# Patient Record
Sex: Female | Born: 1960 | Race: White | Hispanic: No | Marital: Single | Smoking: Former smoker
Health system: Southern US, Community
[De-identification: ages and names within clinical notes are randomized; demographics above are authoritative.]

## PROBLEM LIST (undated history)

## (undated) DIAGNOSIS — C801 Malignant (primary) neoplasm, unspecified: Secondary | ICD-10-CM

## (undated) DIAGNOSIS — R600 Localized edema: Secondary | ICD-10-CM

## (undated) DIAGNOSIS — R0600 Dyspnea, unspecified: Secondary | ICD-10-CM

## (undated) DIAGNOSIS — F419 Anxiety disorder, unspecified: Secondary | ICD-10-CM

## (undated) DIAGNOSIS — I509 Heart failure, unspecified: Secondary | ICD-10-CM

## (undated) DIAGNOSIS — M1990 Unspecified osteoarthritis, unspecified site: Secondary | ICD-10-CM

## (undated) DIAGNOSIS — F329 Major depressive disorder, single episode, unspecified: Secondary | ICD-10-CM

## (undated) DIAGNOSIS — F32A Depression, unspecified: Secondary | ICD-10-CM

## (undated) DIAGNOSIS — E785 Hyperlipidemia, unspecified: Secondary | ICD-10-CM

## (undated) DIAGNOSIS — E119 Type 2 diabetes mellitus without complications: Secondary | ICD-10-CM

## (undated) DIAGNOSIS — I2510 Atherosclerotic heart disease of native coronary artery without angina pectoris: Secondary | ICD-10-CM

## (undated) HISTORY — PX: BLADDER REPAIR: SHX76

## (undated) HISTORY — DX: Heart failure, unspecified: I50.9

## (undated) HISTORY — DX: Atherosclerotic heart disease of native coronary artery without angina pectoris: I25.10

## (undated) HISTORY — PX: CARPAL TUNNEL RELEASE: SHX101

## (undated) HISTORY — PX: BACK SURGERY: SHX140

## (undated) HISTORY — PX: ABDOMINAL HYSTERECTOMY: SHX81

---

## 1999-05-21 ENCOUNTER — Encounter: Admission: RE | Admit: 1999-05-21 | Discharge: 1999-08-19 | Payer: Self-pay

## 2000-12-14 ENCOUNTER — Other Ambulatory Visit: Admission: RE | Admit: 2000-12-14 | Discharge: 2000-12-14 | Payer: Self-pay | Admitting: Internal Medicine

## 2001-01-01 ENCOUNTER — Encounter: Payer: Self-pay | Admitting: Internal Medicine

## 2001-01-01 ENCOUNTER — Ambulatory Visit (HOSPITAL_COMMUNITY): Admission: RE | Admit: 2001-01-01 | Discharge: 2001-01-01 | Payer: Self-pay | Admitting: Internal Medicine

## 2001-03-11 ENCOUNTER — Emergency Department (HOSPITAL_COMMUNITY): Admission: EM | Admit: 2001-03-11 | Discharge: 2001-03-11 | Payer: Self-pay | Admitting: Emergency Medicine

## 2001-04-13 ENCOUNTER — Encounter: Admission: RE | Admit: 2001-04-13 | Discharge: 2001-07-12 | Payer: Self-pay

## 2001-06-11 ENCOUNTER — Encounter: Admission: RE | Admit: 2001-06-11 | Discharge: 2001-09-09 | Payer: Self-pay | Admitting: Neurosurgery

## 2001-10-08 ENCOUNTER — Emergency Department (HOSPITAL_COMMUNITY): Admission: EM | Admit: 2001-10-08 | Discharge: 2001-10-08 | Payer: Self-pay | Admitting: *Deleted

## 2001-10-17 ENCOUNTER — Other Ambulatory Visit: Admission: RE | Admit: 2001-10-17 | Discharge: 2001-10-17 | Payer: Self-pay | Admitting: Obstetrics & Gynecology

## 2001-11-29 ENCOUNTER — Ambulatory Visit (HOSPITAL_COMMUNITY): Admission: RE | Admit: 2001-11-29 | Discharge: 2001-11-29 | Payer: Self-pay | Admitting: Obstetrics & Gynecology

## 2001-12-05 ENCOUNTER — Ambulatory Visit (HOSPITAL_COMMUNITY): Admission: RE | Admit: 2001-12-05 | Discharge: 2001-12-05 | Payer: Self-pay | Admitting: Pulmonary Disease

## 2002-06-07 ENCOUNTER — Ambulatory Visit (HOSPITAL_COMMUNITY): Admission: RE | Admit: 2002-06-07 | Discharge: 2002-06-07 | Payer: Self-pay | Admitting: Pulmonary Disease

## 2002-07-30 ENCOUNTER — Ambulatory Visit (HOSPITAL_COMMUNITY): Admission: RE | Admit: 2002-07-30 | Discharge: 2002-07-30 | Payer: Self-pay | Admitting: Pulmonary Disease

## 2002-10-03 ENCOUNTER — Inpatient Hospital Stay (HOSPITAL_COMMUNITY): Admission: RE | Admit: 2002-10-03 | Discharge: 2002-10-06 | Payer: Self-pay | Admitting: Obstetrics & Gynecology

## 2002-11-12 ENCOUNTER — Emergency Department (HOSPITAL_COMMUNITY): Admission: EM | Admit: 2002-11-12 | Discharge: 2002-11-12 | Payer: Self-pay | Admitting: Emergency Medicine

## 2003-03-11 ENCOUNTER — Ambulatory Visit (HOSPITAL_COMMUNITY): Admission: RE | Admit: 2003-03-11 | Discharge: 2003-03-11 | Payer: Self-pay | Admitting: Pulmonary Disease

## 2003-03-14 ENCOUNTER — Ambulatory Visit (HOSPITAL_COMMUNITY): Admission: RE | Admit: 2003-03-14 | Discharge: 2003-03-14 | Payer: Self-pay | Admitting: *Deleted

## 2003-03-16 ENCOUNTER — Emergency Department (HOSPITAL_COMMUNITY): Admission: EM | Admit: 2003-03-16 | Discharge: 2003-03-16 | Payer: Self-pay | Admitting: Emergency Medicine

## 2003-03-31 ENCOUNTER — Ambulatory Visit (HOSPITAL_COMMUNITY): Admission: RE | Admit: 2003-03-31 | Discharge: 2003-03-31 | Payer: Self-pay | Admitting: Cardiology

## 2003-06-09 ENCOUNTER — Emergency Department (HOSPITAL_COMMUNITY): Admission: EM | Admit: 2003-06-09 | Discharge: 2003-06-09 | Payer: Self-pay | Admitting: Emergency Medicine

## 2003-06-11 ENCOUNTER — Ambulatory Visit (HOSPITAL_COMMUNITY): Admission: RE | Admit: 2003-06-11 | Discharge: 2003-06-11 | Payer: Self-pay | Admitting: Pulmonary Disease

## 2003-08-04 ENCOUNTER — Ambulatory Visit (HOSPITAL_COMMUNITY): Admission: RE | Admit: 2003-08-04 | Discharge: 2003-08-04 | Payer: Self-pay | Admitting: Pulmonary Disease

## 2004-03-22 ENCOUNTER — Ambulatory Visit (HOSPITAL_COMMUNITY): Admission: RE | Admit: 2004-03-22 | Discharge: 2004-03-22 | Payer: Self-pay | Admitting: Pulmonary Disease

## 2004-04-21 ENCOUNTER — Ambulatory Visit (HOSPITAL_COMMUNITY): Admission: RE | Admit: 2004-04-21 | Discharge: 2004-04-21 | Payer: Self-pay | Admitting: Family Medicine

## 2004-08-05 ENCOUNTER — Ambulatory Visit (HOSPITAL_COMMUNITY): Admission: RE | Admit: 2004-08-05 | Discharge: 2004-08-05 | Payer: Self-pay | Admitting: Family Medicine

## 2004-08-23 ENCOUNTER — Encounter: Admission: RE | Admit: 2004-08-23 | Discharge: 2004-08-23 | Payer: Self-pay | Admitting: Orthopedic Surgery

## 2005-08-08 ENCOUNTER — Ambulatory Visit (HOSPITAL_COMMUNITY): Admission: RE | Admit: 2005-08-08 | Discharge: 2005-08-08 | Payer: Self-pay | Admitting: Obstetrics & Gynecology

## 2005-12-14 ENCOUNTER — Ambulatory Visit (HOSPITAL_COMMUNITY): Admission: RE | Admit: 2005-12-14 | Discharge: 2005-12-14 | Payer: Self-pay | Admitting: Neurological Surgery

## 2006-01-23 ENCOUNTER — Inpatient Hospital Stay (HOSPITAL_COMMUNITY): Admission: RE | Admit: 2006-01-23 | Discharge: 2006-01-25 | Payer: Self-pay | Admitting: Neurological Surgery

## 2006-05-01 ENCOUNTER — Ambulatory Visit: Payer: Self-pay | Admitting: Cardiology

## 2006-08-17 ENCOUNTER — Ambulatory Visit (HOSPITAL_COMMUNITY): Admission: RE | Admit: 2006-08-17 | Discharge: 2006-08-17 | Payer: Self-pay | Admitting: Family Medicine

## 2007-04-17 ENCOUNTER — Encounter: Payer: Self-pay | Admitting: Orthopedic Surgery

## 2007-04-17 ENCOUNTER — Ambulatory Visit (HOSPITAL_COMMUNITY): Admission: RE | Admit: 2007-04-17 | Discharge: 2007-04-17 | Payer: Self-pay | Admitting: Neurological Surgery

## 2007-07-18 ENCOUNTER — Ambulatory Visit (HOSPITAL_COMMUNITY): Admission: RE | Admit: 2007-07-18 | Discharge: 2007-07-18 | Payer: Self-pay | Admitting: Family Medicine

## 2008-07-11 HISTORY — PX: BACK SURGERY: SHX140

## 2008-07-28 ENCOUNTER — Ambulatory Visit (HOSPITAL_COMMUNITY): Admission: RE | Admit: 2008-07-28 | Discharge: 2008-07-28 | Payer: Self-pay | Admitting: Obstetrics & Gynecology

## 2009-01-13 ENCOUNTER — Encounter: Payer: Self-pay | Admitting: Orthopedic Surgery

## 2009-01-13 ENCOUNTER — Ambulatory Visit (HOSPITAL_COMMUNITY): Admission: RE | Admit: 2009-01-13 | Discharge: 2009-01-13 | Payer: Self-pay | Admitting: Family Medicine

## 2009-02-18 ENCOUNTER — Ambulatory Visit: Payer: Self-pay | Admitting: Orthopedic Surgery

## 2009-02-18 DIAGNOSIS — M1710 Unilateral primary osteoarthritis, unspecified knee: Secondary | ICD-10-CM | POA: Insufficient documentation

## 2009-02-18 DIAGNOSIS — M23302 Other meniscus derangements, unspecified lateral meniscus, unspecified knee: Secondary | ICD-10-CM

## 2009-02-18 DIAGNOSIS — IMO0002 Reserved for concepts with insufficient information to code with codable children: Secondary | ICD-10-CM | POA: Insufficient documentation

## 2009-02-26 ENCOUNTER — Telehealth: Payer: Self-pay | Admitting: Orthopedic Surgery

## 2009-03-03 ENCOUNTER — Ambulatory Visit (HOSPITAL_COMMUNITY): Admission: RE | Admit: 2009-03-03 | Discharge: 2009-03-03 | Payer: Self-pay | Admitting: Orthopedic Surgery

## 2009-03-09 ENCOUNTER — Ambulatory Visit: Payer: Self-pay | Admitting: Orthopedic Surgery

## 2009-07-30 ENCOUNTER — Ambulatory Visit (HOSPITAL_COMMUNITY): Admission: RE | Admit: 2009-07-30 | Discharge: 2009-07-30 | Payer: Self-pay | Admitting: Obstetrics & Gynecology

## 2010-06-22 ENCOUNTER — Ambulatory Visit (HOSPITAL_COMMUNITY)
Admission: RE | Admit: 2010-06-22 | Discharge: 2010-06-22 | Payer: Self-pay | Source: Home / Self Care | Attending: Obstetrics & Gynecology | Admitting: Obstetrics & Gynecology

## 2010-07-29 ENCOUNTER — Emergency Department (HOSPITAL_COMMUNITY)
Admission: EM | Admit: 2010-07-29 | Discharge: 2010-07-30 | Payer: Self-pay | Source: Home / Self Care | Admitting: Emergency Medicine

## 2010-08-02 ENCOUNTER — Ambulatory Visit (HOSPITAL_COMMUNITY)
Admission: RE | Admit: 2010-08-02 | Discharge: 2010-08-02 | Payer: Self-pay | Source: Home / Self Care | Attending: Obstetrics & Gynecology | Admitting: Obstetrics & Gynecology

## 2010-08-02 LAB — URINALYSIS, ROUTINE W REFLEX MICROSCOPIC
Nitrite: NEGATIVE
Specific Gravity, Urine: >1.03 — ABNORMAL HIGH (ref 1.005–1.030)
pH: 5.5 (ref 5.0–8.0)

## 2010-08-02 LAB — CBC
HCT: 40.3 % (ref 36.0–46.0)
Hemoglobin: 14.3 g/dL (ref 12.0–15.0)
RDW: 13.4 % (ref 11.5–15.5)
WBC: 11.1 10*3/uL — ABNORMAL HIGH (ref 4.0–10.5)

## 2010-08-02 LAB — BASIC METABOLIC PANEL
GFR calc Af Amer: >60 mL/min (ref >60)
GFR calc non Af Amer: >60 mL/min (ref >60)
Glucose, Bld: 104 mg/dL — ABNORMAL HIGH (ref 70–99)
Potassium: 3.9 mEq/L (ref 3.5–5.1)
Sodium: 138 mEq/L (ref 135–145)

## 2010-08-02 LAB — DIFFERENTIAL
Basophils Absolute: 0.1 10*3/uL (ref 0.0–0.1)
Basophils Relative: 0 % (ref 0–1)
Lymphocytes Relative: 31 % (ref 12–46)
Neutro Abs: 6.8 10*3/uL (ref 1.7–7.7)

## 2010-11-26 NOTE — Discharge Summary (Signed)
Baxter, Susan             ACCOUNT NO.:  [id number]   MEDICAL RECORD NO.:  [id number]          PATIENT TYPE:  INP   LOCATION:  3003                         FACILITY:  [facility]   PHYSICIAN:  Stefani Dama, M.D.  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   DISCHARGE AND FINAL DIAGNOSIS:  Lumbar spondylosis with herniated nucleus  pulposus L5-S1 with lumbar radiculopathy.   CONDITION ON DISCHARGE:  Improving.   HOSPITAL COURSE:  [name] a 50 year old individual was had  significant chronic back pain with lumbar radicular pain getting worse in  the past number of months.  She was evaluated as an outpatient and advised  her regarding decompression, arthrodesis from L5-S1 with PEEK spacers,  pedicle screw fixation and she was taken to the operating room on July 16  where this procedure was performed.  She tolerated it well.  She required  PCA medication for the first 24 hours.  She was evaluated by physical  therapy and found to require the use of walker and it was noted that she  would require some help with the use of a shower chair.  These have been  ordered during the e postoperative course and she has gradually and switched  oral Percocet which she did not tolerate.  The patient requested Vicodin.  The patient has a longstanding history of high dose Vicodin usage and she  will be weaned over the next 3 months.  The incision on her back is clean  and dry.  She is ambulatory with the use of walker at the current time.  Her  condition is improving steadily.   The patient is discharged at this time with appropriate medications  including Vicodin EES #60 without refills, Flexeril 10 mg #30 with p.r.n.  refills.  The patient has been advised to plan to gradually  reduce and wean  her away from narcotic medications over the next 2-3 months' time.  The  patient's husband was in agreement with this plan.      Stefani Dama, M.D.  Electronically Signed     HJE/MEDQ  D:  01/25/2006  T:  01/25/2006  Job:  [id number]

## 2010-11-26 NOTE — Consult Note (Signed)
[facility]  Patient:    [name], [name] Visit Number: [id number] MRN: [id number]          Service Type: PMG Location: [facility] Attending Physician:  Sondra Come Dictated by:   Sondra Come, D.O. Proc. Date: 04/30/01 Admit Date:  04/13/2001                            Consultation Report  REASON FOR EVALUATION:  Ms. Viveiros returns to clinic today as scheduled for trial of lumbar epidural steroid injections for degenerative disk disease of the lumbar spine with L5-S1 distribution radicular pain on the right.  Patient states that her pain today is a 10/10 on a subjective scale.  She was prescribed Elavil 25 mg for sleep but she is unable to tolerate this.  She states that she was unable to sleep and then had hangover-type symptoms.  She also has gone to physical therapy but states that it hurt her worse than she was prior.  She stated she went to the emergency room a week or so ago secondary to pain and was given Valium 10 mg but is not currently taking these.  Patient denies any new neurologic complaints.  No bowel and bladder dysfunction.  PHYSICAL EXAMINATION:  GENERAL:  Physical examination reveals a healthy female in no acute distress.  VITAL SIGNS:  Blood pressure 123/64, pulse 74, respirations 18 and regular, O2 saturation is 98%.  MUSCULOSKELETAL/NEUROMUSCULAR:  Manual muscle testing is 5/5, bilateral lower extremities.  Sensory is intact to light touch at this time, bilateral lower extremities.  Muscle stretch reflexes are 2+/4, bilateral patellar, medial hamstrings and Achilles.  Straight leg raise is negative bilaterally.  IMPRESSION: 1. 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  Pain is    likely discogenic and there are right L5-S1 radicular symptoms. 2. Nonrestorative sleep disorder.  PLAN: 1. Lumbar epidural steroid injection.  Procedure was described to patient and    informed consent was obtained.   Patient was brought back to the fluoroscopy    suite and placed on the table in prone position.  Skin was prepped and    draped in the usual sterile fashion.  Skin and subcutaneous tissue were    anesthetized with 3 cc of 1% lidocaine preservative-free.  Under    fluoroscopy, it is noted that patient has a partially lumbarized S1    vertebra.  Under direct fluoroscopic guidance, an 18-gauge 3-1/2-inch Tuohy    needle was advanced into the paramedian right L5-S1 epidural space with    loss-of-resistance technique.  No CSF, heme or paresthesia were noted.    This was then injected with 1 cc of Depo-Medrol 80 mg/cc,    preservative-free, plus 2 cc of normal saline without complication.    Patient tolerated the procedure well.  Discharge instructions were given. 2. Discontinue Elavil. 3. Trial of trazodone 50 mg 1 p.o. q.h.s., #30 with 1 refill. 4. Continue current medications to include hydrocodone 7.5 mg one p.o. q.6h.    as needed for severe pain as well as Neurontin 300 mg one p.o. t.i.d. 5. Patient to return to clinic in one week for reevaluation and possible    second lumbar epidural steroid injection.  Patient was educated in the above findings and recommendations and understands.  There were no barriers to communication. Dictated by:   Sondra Come, D.O. Attending Physician:  Sondra Come DD:  04/30/01 TD:  05/01/01 Job: [id number]

## 2010-11-26 NOTE — Op Note (Signed)
[place]  Patient:    [name], [name] Visit Number: [id number] MRN: [id number]          Service Type: DSU Location: [facility] [id number] Attending Physician:  Lazaro Arms Proc. Date: 11/29/01 Admit Date:  11/29/2001 Discharge Date: 11/29/2001                             Operative Report  PREOPERATIVE DIAGNOSES: 1. Multiple vulvar and perianal lesions, seborrheic keratoses, and perianal    condylomata. 2. Seborrheic keratosis in her lower incision line x3.  POSTOPERATIVE DIAGNOSES: 1. Multiple vulvar and perianal lesions, seborrheic keratoses, and perianal    condylomata. 2. Seborrheic keratosis in her lower incision line x3.  PROCEDURE:  Excision and argon beam coagulator partial vulvectomy.  SURGEON:  _____.  ANESTHESIA: 1. General with laryngeal mask airway. 2. Pudendal block using Sensorcaine 1.5% with 1:200,000 epinephrine placed by    Dr. _____, 20 cc total.  FINDINGS:  The patient was known to have biopsy-proven seborrheic keratosis and condylomata from the vulva and perianal region.  There was no dysplasia or vulvar dysplasia or cancer involved.  DESCRIPTION OF PROCEDURE:  The patient was taken to the operating room and placed in supine position, underwent general LMA, placed in the dorsal lithotomy position, prepped and draped in the usual fashion.  An argon beam coagulator was used and placed on a power setting of 70, and the lesions were excised down to the dermis.  There was probably a total of about 30-40 lesions total, would qualify as a simple partial vulvectomy.  There was no blood loss from the procedure at all.  The patient tolerated the procedure well.  She was awakened from anesthesia and taken to the recovery room in good and stable condition.  All counts were correct. Attending Physician:  Lazaro Arms DD:  11/29/01 TD:  12/01/01 Job: [id number]

## 2010-11-26 NOTE — Consult Note (Signed)
[name], [name]                       ACCOUNT NO.:  [id number]   MEDICAL RECORD NO.:  [id number]                   PATIENT TYPE:  EMS   LOCATION:  ED                                   FACILITY:  [facility]   PHYSICIAN:  Tilda Burrow, M.D.              DATE OF BIRTH:  [birth year]   DATE OF CONSULTATION:  DATE OF DISCHARGE:  03/16/2003                                   CONSULTATION   EMERGENCY ROOM CONSULTATION   TIME OF VISIT:  3:45 p.m.   CHIEF COMPLAINT:  Severe vulvar pain 2 days after procedure at [facility]  [facility] office.  This 50 year old female was seen in the ER due to  progressive severe pain in the perineal area after procedure performed at  our office.  The patient is vague over exactly what she had done, but she  knows that she had packing placed.  She denies use of antibiotics.   She is on hydrocodone without adequate pain relief.  She denies fever or  chills.  No nausea or vomiting.  She is out of work this weekend and is  severely uncomfortable and knows that work would be impossible at this  current level.  She rates the pain as quite high.   PAST MEDICAL HISTORY:  Benign.   ALLERGIES:  Denies medical allergies.   PHYSICAL EXAMINATION:  GENERAL: Exam shows a generally healthy-appearing,  Caucasian female in moderate discomfort.  ABDOMEN:  Nontender without masses.  EXTREMITIES:  Normal, no limited range of motion.  GENITOURINARY: External genitalia shows an open 3 cm lesion just lateral to  the left gluteal crease with iodoform packing. There is significant malodor.  Packing is removed to reveal a 2 cm deep x 3 cm long crater in the subcu  tissues.  There is no active purulent drainage.  The patient does not allow  thorough exam, but there is only a small area of erythema around it and no  extension towards the anus or vagina.  No extension towards the anus or  vagina.  No vaginal discharge visible at the introitus.  Pelvic exam,  itself,  deferred.   IMPRESSION:  Cellulitis and swelling around previously opened vulvar  abscess.   PLAN:  1. Add antibiotic therapy and Sitz baths to current regimen.  2. Increase analgesics to Tylox 1-2 q.4h. p.r.n. severe pain.  3.     Sitz baths at least twice daily and Keflex 500 q.i.d. x5 days.  4. Follow up in 2 days unless symptoms dramatically improve, at which time     visit can be delayed 1 week.   ADDENDUM:  OV intermediate ER visit.  CPT [phone]  Tilda Burrow, M.D.    JVF/MEDQ  D:  03/16/2003  T:  03/16/2003  Job:  [id number]

## 2010-11-26 NOTE — Consult Note (Signed)
[facility]  Patient:    [name], [name] Visit Number: [id number] MRN: [id number]          Service Type: PMG Location: [facility] Attending Physician:  Sondra Come Dictated by:   Sondra Come, D.O. Proc. Date: 05/07/01 Admit Date:  04/13/2001                            Consultation Report  REASON FOR CONSULTATION:  Ms. Amesquita returns to clinic today for reevaluation as scheduled, also for consideration of second lumbar epidural steroid injection.  Patient has degenerative disk disease of the lumbar spondylosis without myelopathy with an annular tear at L5-S1 with right L5-S1 radicular symptoms.  She had an epidural steroid injection last week but states it did not help her; if anything, it made her worse.  She also continues with physical therapy which she also states has made her worse.  Now she complains of pain radiating into her left lower extremity as well and pain in her upper extremities and paresthesias in all four extremities with fatigue and poor endurance.  She has tried taking the trazodone at bedtime, which she states is not helping her sleep.  She continues to take Neurontin 300 mg t.i.d. without significant improvement.  She takes Norco 7.5/325 mg sparingly. She brings her medications with her.  She has 15 of 50 pills of Norco left; this was written on April 16, 2001.  Currently, patients pain is a 10/10 on a subjective scale.  Her function and quality-of-life indices remain declined. Her sleep remains poor.  Her paresthetic symptoms in her upper and lower extremities are intermittent.  She complains of swelling in all four extremities as well.  PHYSICAL EXAMINATION:  GENERAL:  Physical examination reveals a healthy female in no acute distress. Patients mood and affect are depressed and flat.  VITAL SIGNS:  Blood pressure is 113/62, pulse 75, respirations 16, O2 saturation 98%.  NEUROMUSCULAR:  Gait is antalgic.  Range of motion  of the cervical and lumbar spines is limited secondary to pain.  Manual muscle testing is 5/5, bilateral upper and lower extremities, with diffuse giveaway weakness and poor effort. Sensory examination is decreased to light touch in a stocking-glove distribution at this time.  Muscle stretch reflexes are 2+/4, bilateral biceps, triceps, brachioradialis, pronator teres, patellar, medial hamstrings and Achilles.  Tinels is negative, bilateral wrists, over the median and ulnar nerves.  Palpatory examination reveals diffuse tenderness to palpation, bilateral upper back, periscapular, lateral epicondyles, anterior cervical, anterior chest, gluteal muscles, trochanteric bursae and medial knees.  IMPRESSION: 1. 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  There is    discogenic component with annular tear at L5-S1 with predominantly    right-sided radicular symptoms, however, this lesion is unlikely causing    patients current symptoms to involve the left lower extremity and    bilateral upper extremities. 2. Fibromyalgic-like symptoms. 3. Nonrestorative sleep disorder. 4. Depressive mood.  PLAN: 1. Will forego lumbar epidural steroid injection at this time per patients    request. 2. Will obtain blood work to include complete chemistry panel with renal and    liver function, CBC, ANA, rheumatoid factor, sed rate and TSH. 3. Patient to continue current medications.  I have instructed her to increase    the trazodone to 50 mg, two pills at bedtime, continue Neurontin and    continue Norco sparingly at this time. 4. Patient to continue  physical therapy. 5. Patient to return to clinic in one to two weeks to discuss laboratory work    and further treatment options.  Patient was educated in the above findings and recommendations and understands.  There were no barriers to communication. Dictated by:   Sondra Come, D.O. Attending Physician:  Sondra Come DD:   05/07/01 TD:  05/08/01 Job: [id number]

## 2010-11-26 NOTE — Consult Note (Signed)
[facility]  Patient:    [name], [name] Visit Number: [id number] MRN: [id number]          Service Type: PMG Location: [facility] Attending Physician:  Sondra Come Dictated by:   Sondra Come, D.O. Proc. Date: 05/21/01 Admit Date:  04/13/2001                            Consultation Report  REASON FOR FOLLOWUP:  [name] returns to clinic today for reevaluation as scheduled.  She was last seen on May 07, 2001.  Since that time, she has completed her course of physical therapy and continues doing a home exercise program, which she states she feels is making matters worse, although she continued to do the exercises in hopes that it will eventually help her.  I commend her for her continued participation in a home exercise program and we discuss this.  Her sleep is improved with trazodone 100 mg at bedtime.  Her pain today still is a 10/10 on a subjective scale.  Since last visit, I reviewed her laboratory workup which included a mildly elevated white blood cell count at 10.6 (normal is 10.5).  Sed rate was 31.  Otherwise, comprehensive metabolic panel, thyroid-stimulating hormone, rheumatoid factor and ANA are normal.  [name] pain is primarily in her back radiating down her right lower extremity and occasionally to the left lower extremity. Her upper extremity and upper back pain have improved since last visit.  She continues to take Neurontin 300 mg three times daily and Norco 7.5 mg up to three times daily.  She states she typically will take Aleve or Goodys first to see if that relieves her pain.  She denies any significant depression.  She denies bowel and bladder dysfunction or other neurologic complaints.  I review health and history form and 14-point review of systems.  Patient quit smoking approximately five days ago and I commend her for this.  PHYSICAL EXAMINATION:  GENERAL:  Physical examination reveals a healthy female in no  acute distress. Mood is depressed and affect is flat.  VITAL SIGNS:  Blood pressure is 105/63, pulse 80, respirations 16, O2 saturation 97%.  NEUROMUSCULAR:  Range of motion of the lumbar spine is limited secondary to pain.  Manual muscle testing is 5/5, bilateral lower extremities, with occasional poor effort.  Sensory exam is decreased to light touch mildly in a stocking distribution.  Muscle stretch reflexes are 2+/4, bilateral lower extremities.  Neuro exam of the upper extremities reveals normal strength, sensation and reflexes today.  There is less tenderness to palpation in the upper back musculature.  Mild tenderness to palpation in bilateral lumbar paraspinals.  IMPRESSION: 1. Degenerative disk disease of lumbar spine without myelopathy, discogenic    component with annular tear at L5-S1 with predominantly right-sided    radicular symptoms. 2. Fibromyalgic-like symptoms seem to be improved today. 3. Nonrestorative sleep disorder, improved with trazodone 100 mg q.h.s. 4. Depressed mood.  PLAN: 1. Discussed exercise program with patient and encouraged her to continue to    perform home exercise program. 2. Will refer patient to Behavioral Health psychology to see    Dr. Gladstone Pih at the [facility] for    coping strategies, stress management, relaxation techniques, etc. 3. Continue trazodone 100 mg p.o. q.h.s., #30 with two refills. 4. Continue Norco 7.5 mg one p.o. t.i.d. as needed, #90 with no refills.  Will  try to prevent escalation of narcotic medication.  May need to consider a    longer-acting medication such as Duragesic patch. 5. Continue Neurontin. 6. Patient to return to clinic in one month for reevaluation.  Patient was educated in the above findings and recommendations and understands.  There were no barriers to communication. Dictated by:   Sondra Come, D.O. Attending Physician:  Sondra Come DD:  05/21/01 TD:   05/21/01 Job: [id number]

## 2010-11-26 NOTE — Consult Note (Signed)
[facility]  Patient:    [name], [name] Visit Number: [id number] MRN: [id number]          Service Type: PMG Location: [facility] Attending Physician:  Sondra Come Dictated by:   Sondra Come, D.O. Admit Date:  04/13/2001                            Consultation Report  REASON FOR FOLLOWUP:  Ms. Boghosian returns to clinic today for reevaluation as scheduled.  She was last seen May 21, 2001.  In the interim, she has been evaluated and treated by Dr. Gladstone Pih in behavioral health psychology at the [facility].  She continues to perform her home exercises and continues to try to work.  She has been continuing to take Norco 7.5 mg three times daily as needed for low back pain with modest relief.  She also continues to take trazodone 100 mg at bedtime with still some difficulty sleeping but she states the medicine helps somewhat.  Her low back pain today is rated at 10/10, although she states it is moderate to severe at times.  She continues to have decline in her function and quality-of-life indices and poor sleep.  Her pain continues to radiate into the right lower extremity.  She was unable to tolerate Neurontin secondary to GI symptoms.  She also notes that she occasionally takes over-the-counter Aleve for headaches, which also helps with her low back pain. She denies any new neurologic complaints.  Denies bowel and bladder dysfunction.  I review health and history form and 14-point review of systems.  PHYSICAL EXAMINATION:  GENERAL:  Physical examination reveals a healthy female in no acute distress. Mood is depressed.  Affect is flat.  VITAL SIGNS:  Blood pressure is 114/69, pulse 68, respirations 16.  NEUROMUSCULAR:  There is tenderness to palpation in bilateral lumbar paraspinals with guarded range of motion of the lumbar spine.  Manual muscle testing is 5/5, bilateral lower extremities.  Sensory  exam is intact to light touch in all distributions in the lower extremities today.  Muscle stretch reflexes are 2+/4, bilateral lower extremity.  IMPRESSION: 1. 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    Discogenic component with annular tear at L5-S1 with predominantly    right-sided right lower extremity radicular symptoms. 2. Nonrestorative sleep disorder, modestly improved with trazodone. 3. Depressed mood with anxiety component.  PLAN: 1. Continue home exercise program. 2. Continue with behavioral health psychology. 3. Will start Naprosyn 500 mg one p.o. b.i.d. as tolerated, #60 with 1 refill. 4. Will start Paxil 20 mg p.o. q.d., #30 with 1 refill. 5. Continue hydrocodone 7.5 mg t.i.d. as needed for pain, new prescription for    #90 with no refills. 6. Continue trazodone. 7. Consider Gabitril or Topamax for radicular symptoms if not improved. 8. Patient to return to clinic in three weeks for reevaluation.  Patient was educated on the above findings and recommendations and understands.  There were no barriers to communication. Dictated by:   Sondra Come, D.O. Attending Physician:  Sondra Come DD:  06/18/01 TD:  06/18/01 Job: [id number]

## 2010-11-26 NOTE — H&P (Signed)
[place]  Patient:    [name], [name] Visit Number: [id number] MRN: [id number]          Service Type: DSU Location: [facility] 2899 [id number] Attending Physician:  Lazaro Arms Admit Date:  11/29/2001 Discharge Date: 11/29/2001                           History and Physical  DATE OF BIRTH:  07-23-1960  HISTORY OF PRESENT ILLNESS:  [name] is a 50 year old white female.  She is gravida two, para two with last menstrual period of Nov 24, 2001, who presented to me originally in early April complaining of some pigmented lesions in her vulva and other lesions around her rectum.  She had a normal Pap Smear at that time and I did a biopsy of one of the pigmented vulvar lesions, which has returned as a pigmented seborrheic keratosis.  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   There is not a dysplasia or malignancy associated.  As a result, she is admitted for argon beam coagulation excision because she has so many.  This would be the least traumatic and the least defective cosmetic surgery.  PAST MEDICAL HISTORY: 1. Depression. 2. Chronic back pain. 3. Anxiety.  PAST SURGICAL HISTORY:  Two cesarean sections.  PAST OBSTETRICAL HISTORY:  Two cesarean sections.  ALLERGIES:  TALWIN and CODEINE.  MEDICATIONS:  Effexor XR 150, Valium 10 mg b.i.d., and Darvocet as needed.  REVIEW OF SYSTEMS:  Significant for migraine headache, chronic constipation, nervous disorder and depression.  The rest of the Review of Systems is negative.  FAMILY HISTORY:  Significant for heart disease in her grandparents and father. Hypertension in dad and grandparents.  Cancer in her grandparents, diabetes in her dad and her two grandparents.  SOCIAL HISTORY:  She smokes a half pack of cigarettes a day.  She completed the ninth grade.  She does not use drugs or alcohol.  PHYSICAL  EXAMINATION:  VITAL SIGNS:  Blood pressure is 100/70.  Weight is 201 pounds.  HEENT:  Unremarkable with normal thyroid.  LUNGS:  Clear.  HEART:  Regular rate and rhythm without murmur, ______ or gallop.  BREASTS:  Without mass, discharge, or skin changes.  ABDOMEN:  Benign, no hepatosplenomegaly or masses.  PELVIC:  Reveals external genitalia with several verrucous type seborrheic keratosis lesions, all of which are darkly pigmented.  The perianal area also has multiple condylomata present.  Vagina is pink and moist without discharge.  Cervix is ______ without lesions.  Her Pap had been normal previously. Uterus is normal and nontender.  Ovaries are normal and nontender.  EXTREMITIES:  Warm with no edema.  NEUROLOGIC EXAMINATION:  Grossly intact.  IMPRESSION: 1. 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 2. Perianal condyloma.  PLAN:  The patient is admitted for excision of the vulvar and perianal lesions.  We will be using argon beam coagulator to diminish the destructive nature of laser and/or excision.  The patient understands the risks, benefits, indications, and alternatives and will proceed. Attending Physician:  Lazaro Arms DD:  11/26/01 TD:  11/26/01 Job: [id number]

## 2010-11-26 NOTE — Op Note (Signed)
[name], [name]             ACCOUNT NO.:  [id number]   MEDICAL RECORD NO.:  [id number]          PATIENT TYPE:  INP   LOCATION:  3172                         FACILITY:  [facility]   PHYSICIAN:  Stefani Dama, M.D.  DATE OF BIRTH:  1961-02-11   DATE OF PROCEDURE:  01/23/2006  DATE OF DISCHARGE:                                 OPERATIVE REPORT   PREOPERATIVE DIAGNOSIS:  Lumbar spondylosis plus herniated nucleus pulposus,  L5-S1, with lumbar radiculopathy.   POSTOPERATIVE DIAGNOSIS:  Lumbar spondylosis plus herniated nucleus  pulposus, L5-S1, with lumbar radiculopathy.   PROCEDURE:  Bilateral diskectomy, L5-S1, with decompression of L5 and S1  nerve roots; posterolateral arthrodesis with PEEK bone spacers, autograft  and allograft pedicle screw fixation, L5-S1, with posterolateral arthrodesis  with autograft and allograft.   SURGEON:  Dr. Danielle Dess.   FIRST ASSISTANT:  Dr. Colon Branch.   ANESTHESIA:  General endotracheal.   INDICATIONS:  [name] is a 50 year old individual who has had  significant back pain for a prolonged period of time, as long as 20 years.  She was evaluated over the course of the last 10 years variously by  neurosurgery and conservative pain managers and having failed at all of  these and having excessive pain now to the point where she has difficulty  with activities of daily living.  She is advised regarding decompression  arthrodesis at the L5-S1 level.  She has a transitional vertebra below the  S1 with an extra disk space, but this is not a formed vertebra.   PROCEDURE:  The patient was brought to the operating room supine on the  stretcher.  After smooth induction of general endotracheal anesthesia, she  was turned prone.  The back was then prepped with DuraPrep and draped in a  sterile fashion.  Midline incision was created and carried down to  lumbodorsal fascia.  Fascia was opened on either side of the midline to  expose the first spinous  processes which were noted be that of L4-L5.  Once  L5 was identified, then the interlaminar space at L5-S1 was cleared using a  subperiosteal dissection technique.  Self-retaining McCullough retractor was  placed in the wound, and then a laminotomy was created removing the inferior  margin lamina of L5 out to and including the entirety of the facette joint.  This uncovered the common dural tube and the takeoff the S1 nerve root  inferiorly, and the L5 nerve root could be noted superiorly laterally.  On  the right side, the L5 nerve root appeared to be significantly stenosed and  what appeared to be a narrowing of the foramen between a superior protrusion  of the disk in the subligamentous space.  The disk space was isolated in  this area using a microdissection technique using bipolar cautery to remove  epidural veins in this region, and the disk space was entered using a  combination of Kerrison rongeurs.  A significant quantity of severely  desiccated disk material was removed.  The lateral aspect of this disk space  was then cleared out, and the subligamentous material compressing the L5  nerve root was removed.  Thus, the L5 nerve root was decompressed.  Attention was then turned to the contralateral side, and on the left side,  there was noted be degeneration of the disk with some bowing of the disk  posteriorly in the central portion of the canal.  However, the lateral  recess was noted be free and clear.  The disk space was opened on this side  also, and a significant quantity of severely degenerated disk material was  also removed.  Common dural tube and the S1 nerve root were protected and/or  decompressed by this maneuver.  Then, a series of curettes and disk shavers  were used to evacuate the entirety of the disk space of any remnants of disk  down to the ventral aspect of the vertebral body, and ultimately the  endplates were decorticated so as to allow good surface for fusion.   Once  this was accomplished, the interspaces were sized, and it was felt that an  11-mm PLIF spacer would fit nicely into the interspace without over  distracting the interspace itself.  The PLIF spacers were filled with  combination of the patient's autograft and some ostia cell material that was  mixed together. Then, PLIF spacer was placed first on the right side.  Then,  from the left side, the remainder of the disk space was packed with loose  bony fragments, and then a second PLIF spacer was placed on that side.  The  rest of the disk space was filled with the remainder of the bone.  Then,  pedicle entry sites were chosen using fluoroscopic guidance.  Entry sites  were chosen at L5 and S1.  These were probed and tapped to the 6.5 mm  diameter.  The inner aspects of the screw holes were checked for contiguity,  and no cutout was noted.  Then, 6.5 x 45-mm sacral screws were then placed  in the sacrum, and 6.5 x 45-mm standard screws were placed at L5.  Their  position was checked radiographically at numerous times during insertion of  the screws, and final radiographs were obtained in AP and lateral  projection.  Then, with the fluoroscoping removed, the standard precontoured  40-mm rods were placed between the screw heads, and these were provisionally  tightened.  The interpedicular space then filled with the remainder of the  bone graft as was the interspinous space, and finally, the screws were  tightened and torqued into the final position.  The wound was copiously  irrigated with antibiotic irrigating solution.  The decompression of the L5-  S1 nerve roots was checked repeatedly, and then the lumbodorsal fascia was  closed with #1 Vicryl in interrupted fashion, 2-0 Vicryl was used in the  subcutaneous tissues, 3-0 Vicryl subcuticularly and Dermabond was placed on  the skin.  The patient tolerated procedure well and was returned to recovery in stable condition.      Stefani Dama, M.D.  Electronically Signed     HJE/MEDQ  D:  01/23/2006  T:  01/23/2006  Job:  [id number]
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
## 2010-11-26 NOTE — Discharge Summary (Signed)
   NAMEDECEMBER, HEDTKE                       ACCOUNT NO.:  [id number]   MEDICAL RECORD NO.:  [id number]                   PATIENT TYPE:  INP   LOCATION:  A420                                 FACILITY:  [facility]   PHYSICIAN:  Lazaro Arms, M.D.                DATE OF BIRTH:  08-Jun-1961   DATE OF ADMISSION:  10/03/2002  DATE OF DISCHARGE:  10/06/2002                                 DISCHARGE SUMMARY   DISCHARGE DIAGNOSES:  1. Status post total abdominal hysterectomy bilateral salpingo-oophorectomy.  2. Unremarkable postoperative course.   HISTORY OF PRESENT ILLNESS:  Please refer to the transcribed history and  physical and the operative report for details of the admission to the  hospital.   HOSPITAL COURSE:  The patient was admitted postoperatively.  She tolerated  clear liquids and a regular diet.  She voided without symptoms.  She was  ambulatory.  She was tolerating progression from IV pain medicine to oral  pain medicine.  She had low-grade temperature on postoperative day #1 and  postoperative day #2 consistent with atelectasis which resolved with  ambulation and increased activities.  On postoperative day #1 hemoglobin and  hematocrit was 10.9 and 32.4 with a white count of 11,800.  Her incision  remained clean, dry, and intact.  Her abdominal exam is benign.  She was  discharged to home on the morning of postoperative day #3 in good stable  condition to follow up in the office next week to have her staples removed.   DISCHARGE MEDICATIONS:  She is given a prescription for Toradol and Tylox  for pain.  A Vivelle patch 0.1 mg, change twice a week, and a Lidoderm patch  every 12 hours for her back pain which is a chronic problem for her.   DISCHARGE INSTRUCTIONS:  She was given instruction precautions to return  prior to that time.                                               Lazaro Arms, M.D.    Loraine Maple  D:  10/22/2002  T:  10/22/2002  Job:  [id number]

## 2010-11-26 NOTE — Procedures (Signed)
   NAME:  [name], [name]                       ACCOUNT NO.:  [id number]   MEDICAL RECORD NO.:  [id number]                   PATIENT TYPE:  OUT   LOCATION:  RAD                                  FACILITY:  [facility]   PHYSICIAN:  Vida Roller, M.D.                DATE OF BIRTH:  1961/06/21   DATE OF PROCEDURE:  03/31/2003  DATE OF DISCHARGE:                                    STRESS TEST   ADENOSINE CARDIOLITE   INDICATION:  [name] is a 50 year old female with known coronary artery  disease whose cardiac risk factors include tobacco abuse and unknown lipid  status.  She comes in with complaints of atypical chest discomfort.   BASELINE DATA:  EKG reveals sinus rhythm at 83 beats per minute with  nonspecific ST abnormalities.  Blood pressure is 126/70.  The patient  originally scheduled to exercise.  She exercised for a total of two minutes  four seconds to Bruce protocol stage 1.  Heart rate achieved was 107 beats  per minute which is 59% of predicted maximum.  Maximum blood pressure was  138/72.  No ischemic changes or arrhythmias were noted on EKG.  The patient  reported bilateral leg cramping and funny feeling in her right shoulder.  Exercise was stopped and test was changed to an adenosine Cardiolite.   BASELINE DATA FOR ADENOSINE CARDIOLITE:  EKG revealed sinus rhythm at 65  beats per minute with a blood pressure of 124/70.  Adenosine 49.5 mg was  infused over four minute protocol with Cardiolite injected at three minutes.  The patient complained of funny feeling, flushing, and nausea.  These  symptoms resolved in recovery.  EKG revealed no ischemic changes and no  arrhythmias.  Maximum heart rate was 100 beats per minute.  Maximum blood  pressure was 140/70.  This resolved down to 118/62 in recovery.   Final images and results are pending M.D. review.     Amy Mercy Riding, P.A. [facility]                     Vida Roller, M.D.    AB/MEDQ  D:  03/31/2003  T:  03/31/2003  Job:   [id number]

## 2010-11-26 NOTE — Consult Note (Signed)
[facility]  Patient:    [name], [name] Visit Number: [id number] MRN: [id number]          Service Type: PMG Location: [facility] Attending Physician:  Sondra Come Dictated by:   Sondra Come, D.O. Proc. Date: 04/16/01 Admit Date:  04/13/2001   CC:         Stefani Dama, M.D.   Consultation Report  NEW PATIENT EVALUATION  REFERRING Matisha Termine:  Dr. Stefani Dama.  Dear Dr. Danielle Dess,  Thank you very much for kindly referring Ms. Chasidy Janak for evaluation and pain management.  [facility] in [facility].  Please refer to the following for details regarding the history, examination and plan.  CHIEF COMPLAINT:  Back and lower extremity pain.  HISTORY OF PRESENT ILLNESS:  [name] is a 50 year old right-hand-dominant female who presents to clinic complaining of low back pain over the past four to five years with radiation of pain into her right buttock, posterolateral thigh and leg.  Patients pain is a 10/10 on a subjective scale and described as constant, achy, throbbing, sharp, stabbing with associated numbness, tingling and lower extremity weakness.  Patient has had physical therapy in the past, approximately two to three years ago, which she states made her symptoms worse.  She has also self-treated with heating pads and ice with minimal relief.  She underwent epidural steroid injections x 3 a few years back with some relief.  She denies bowel and bladder dysfunction.  She admits to occasional increase in pain with Valsalva maneuver including coughing, sneezing and bowel movement.  Her current pain is made worse with walking, bending, sitting and working and improved with medications.  Her function and quality-of-life indices have declined and her sleep is poor.  She is currently taking Neurontin 300 mg at bedtime and hydrocodone 7.5 mg twice daily as needed with minimal relief.  Health and  history form and 14-point review of systems are reviewed.  Patient admits to unexplained weight loss and attributes this to poor appetite secondary to pain.  PAST MEDICAL HISTORY:  Denies.  PAST SURGICAL HISTORY:  C-section x 2.  FAMILY HISTORY:  Heart disease and diabetes.  SOCIAL HISTORY:  Smokes one pack of cigarettes per day and I counseled her on the importance of smoking cessation in terms of pain management and back pain. She denies alcohol use.  She is currently divorced.  She is working part-time for a [employer].  ALLERGIES:  No known drug allergies.  MEDICATIONS: 1. Neurontin 300 mg at bedtime. 2. Hydrocodone 7.5 mg b.i.d. as needed for pain.  Previously, she has tried medications including Motrin, Naprosyn, Celebrex, Vioxx and Ultram without relief.  PHYSICAL EXAMINATION:  GENERAL:  Physical examination reveals a healthy female in no acute distress. Patient is in mild discomfort and changes positions frequently.  Mood and affect are appropriate.  VITAL SIGNS:  Blood pressure is 122/61, pulse 79, respirations 16, O2 saturation 99% on room air.  NEUROMUSCULAR:  Examination of patients spine reveals decreased lumbar lordosis.  Range of motion is decreased in all planes secondary to pain. Manual muscle testing is 5/5, bilateral lower extremities.  Sensory exam is intact to light touch, bilateral lower extremities.  Straight leg raise is negative on the left, equivocal on the right.  FABER is negative bilaterally. Patient has tight hamstrings and hip flexor muscles.  Muscle stretch reflexes are 2+/4, bilateral patellar, medial hamstrings and Achilles.  EXTREMITIES:  Distal  pulses are present, bilateral lower extremities.  There is no heat, erythema or edema in the lower extremities.  There are no rashes, lesions or ulcerations in the lower extremities.  X-RAY FINDINGS:  Plain film x-rays of the lumbar spine are reviewed.  This reveals some degenerative  changes at the lower facet joints as well as six lumbar vertebrae.  MRI of the lumbar spine was also reviewed revealing six non-rib-bearing vertebrae.  There is mild facet hypertrophy and a minimal disk bulge at L4-5. At L5-S1, there is a posterior annular tear with broad-based protrusion which is close to the proximity of the right S1 nerve root.  Other levels are without abnormality.  IMPRESSION: 1. 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    Patients current pain symptoms are likely discogenic in nature with right    L5-S1 distribution radicular symptoms. 2. Nonrestorative sleep disorder.  PLAN: 1. Discussed treatment options with the patient at length.  Will set the    patient up for a retrial of lumbar epidural steroid injections.  Procedure    was described to patient and she agrees and she understands.  She wishes to    proceed. 2. Physical therapy for static-to-dynamic lumbopelvic stabilization exercises,    stretching, strengthening, range of motion and home exercise program two to    three times per week for four weeks. 3. Elavil 25 mg 1 p.o. q.h.s., #30 with 1 refill. 4. Neurontin 300 mg 1 p.o. b.i.d. x 3 days, then 1 p.o. t.i.d., #90 with    1 refill. 5. Norco 7.5 mg 1 p.o. q.6h. p.r.n. severe pain, #50 with no refills.  Patient    was counseled on the use of narcotic analgesia.  She understands that this    is for short-term use only and that 50 tablets will last her 1 month.  She    poses no risk to harm herself or others.  Patient understands that the    non-narcotic alternatives listed will help keep her from escalating her    narcotic use. 6. Patient to return to clinic for injection.  Patient was educated on the above findings and recommendations and understands.  There were no barriers to communication. Dictated by:   Sondra Come, D.O. Attending Physician:  Sondra Come DD:  04/16/01 TD:  04/17/01  Job: [id number]

## 2010-12-15 ENCOUNTER — Emergency Department (HOSPITAL_COMMUNITY): Payer: Medicare Other

## 2010-12-15 ENCOUNTER — Emergency Department (HOSPITAL_COMMUNITY)
Admission: EM | Admit: 2010-12-15 | Discharge: 2010-12-15 | Disposition: A | Discharge status: Home / Self Care | Payer: Medicare Other | Attending: Emergency Medicine | Admitting: Emergency Medicine

## 2010-12-15 DIAGNOSIS — Y998 Other external cause status: Secondary | ICD-10-CM | POA: Insufficient documentation

## 2010-12-15 DIAGNOSIS — F17200 Nicotine dependence, unspecified, uncomplicated: Secondary | ICD-10-CM | POA: Insufficient documentation

## 2010-12-15 DIAGNOSIS — R296 Repeated falls: Secondary | ICD-10-CM | POA: Insufficient documentation

## 2010-12-15 DIAGNOSIS — M25569 Pain in unspecified knee: Secondary | ICD-10-CM | POA: Insufficient documentation

## 2010-12-15 DIAGNOSIS — S8000XA Contusion of unspecified knee, initial encounter: Secondary | ICD-10-CM | POA: Insufficient documentation

## 2010-12-15 DIAGNOSIS — IMO0002 Reserved for concepts with insufficient information to code with codable children: Secondary | ICD-10-CM | POA: Insufficient documentation

## 2011-07-18 ENCOUNTER — Other Ambulatory Visit: Payer: Self-pay | Admitting: Obstetrics & Gynecology

## 2011-07-18 DIAGNOSIS — Z139 Encounter for screening, unspecified: Secondary | ICD-10-CM

## 2011-08-04 ENCOUNTER — Ambulatory Visit (HOSPITAL_COMMUNITY)
Admission: RE | Admit: 2011-08-04 | Discharge: 2011-08-04 | Disposition: A | Discharge status: Home / Self Care | Payer: Medicare Other | Source: Ambulatory Visit | Attending: Obstetrics & Gynecology | Admitting: Obstetrics & Gynecology

## 2011-08-04 DIAGNOSIS — Z139 Encounter for screening, unspecified: Secondary | ICD-10-CM

## 2011-08-04 DIAGNOSIS — Z1231 Encounter for screening mammogram for malignant neoplasm of breast: Secondary | ICD-10-CM | POA: Insufficient documentation

## 2011-09-02 ENCOUNTER — Other Ambulatory Visit (HOSPITAL_COMMUNITY): Payer: Self-pay | Admitting: Preventative Medicine

## 2011-09-02 DIAGNOSIS — R9389 Abnormal findings on diagnostic imaging of other specified body structures: Secondary | ICD-10-CM

## 2011-09-05 ENCOUNTER — Ambulatory Visit (HOSPITAL_COMMUNITY)
Admission: RE | Admit: 2011-09-05 | Discharge: 2011-09-05 | Disposition: A | Discharge status: Home / Self Care | Payer: Medicare Other | Source: Ambulatory Visit | Attending: Preventative Medicine | Admitting: Preventative Medicine

## 2011-09-05 ENCOUNTER — Encounter (HOSPITAL_COMMUNITY): Payer: Self-pay

## 2011-09-05 DIAGNOSIS — R05 Cough: Secondary | ICD-10-CM | POA: Insufficient documentation

## 2011-09-05 DIAGNOSIS — R9389 Abnormal findings on diagnostic imaging of other specified body structures: Secondary | ICD-10-CM

## 2011-09-05 DIAGNOSIS — R911 Solitary pulmonary nodule: Secondary | ICD-10-CM | POA: Insufficient documentation

## 2011-09-05 DIAGNOSIS — R059 Cough, unspecified: Secondary | ICD-10-CM | POA: Insufficient documentation

## 2011-09-05 DIAGNOSIS — R599 Enlarged lymph nodes, unspecified: Secondary | ICD-10-CM | POA: Insufficient documentation

## 2011-09-05 LAB — CREATININE, SERUM
Creatinine, Ser: 0.72 mg/dL (ref 0.50–1.10)
GFR calc non Af Amer: >90 mL/min (ref >90)

## 2011-09-05 MED ORDER — IOHEXOL 300 MG/ML  SOLN
80.0000 mL | Freq: Once | INTRAMUSCULAR | Status: AC | PRN
  Administered 2011-09-05: 80 mL via INTRAVENOUS

## 2011-09-14 ENCOUNTER — Emergency Department (HOSPITAL_COMMUNITY): Payer: Medicare Other

## 2011-09-14 ENCOUNTER — Encounter (HOSPITAL_COMMUNITY): Payer: Self-pay

## 2011-09-14 ENCOUNTER — Inpatient Hospital Stay (HOSPITAL_COMMUNITY)
Admission: EM | Admit: 2011-09-14 | Discharge: 2011-09-16 | DRG: 202 | Disposition: A | Discharge status: Home / Self Care | Payer: Medicare Other | Attending: Family Medicine | Admitting: Family Medicine

## 2011-09-14 ENCOUNTER — Other Ambulatory Visit: Payer: Self-pay

## 2011-09-14 DIAGNOSIS — F17200 Nicotine dependence, unspecified, uncomplicated: Secondary | ICD-10-CM | POA: Diagnosis present

## 2011-09-14 DIAGNOSIS — F411 Generalized anxiety disorder: Secondary | ICD-10-CM | POA: Diagnosis present

## 2011-09-14 DIAGNOSIS — F329 Major depressive disorder, single episode, unspecified: Secondary | ICD-10-CM | POA: Diagnosis present

## 2011-09-14 DIAGNOSIS — E873 Alkalosis: Secondary | ICD-10-CM | POA: Diagnosis present

## 2011-09-14 DIAGNOSIS — R064 Hyperventilation: Secondary | ICD-10-CM | POA: Diagnosis present

## 2011-09-14 DIAGNOSIS — E785 Hyperlipidemia, unspecified: Secondary | ICD-10-CM | POA: Diagnosis present

## 2011-09-14 DIAGNOSIS — R0902 Hypoxemia: Secondary | ICD-10-CM

## 2011-09-14 DIAGNOSIS — F3289 Other specified depressive episodes: Secondary | ICD-10-CM | POA: Diagnosis present

## 2011-09-14 DIAGNOSIS — J45901 Unspecified asthma with (acute) exacerbation: Principal | ICD-10-CM | POA: Diagnosis present

## 2011-09-14 DIAGNOSIS — G894 Chronic pain syndrome: Secondary | ICD-10-CM | POA: Diagnosis present

## 2011-09-14 HISTORY — DX: Hyperlipidemia, unspecified: E78.5

## 2011-09-14 LAB — CBC
Hemoglobin: 14.9 g/dL (ref 12.0–15.0)
Platelets: 322 10*3/uL (ref 150–400)
RBC: 4.78 MIL/uL (ref 3.87–5.11)
WBC: 19.4 10*3/uL — ABNORMAL HIGH (ref 4.0–10.5)

## 2011-09-14 LAB — BLOOD GAS, ARTERIAL
Acid-Base Excess: 1.6 mmol/L (ref 0.0–2.0)
Drawn by: 22874
FIO2: 0.21 %
O2 Saturation: 95.7 %
TCO2: 18.8 mmol/L (ref 0–100)

## 2011-09-14 LAB — CARDIAC PANEL(CRET KIN+CKTOT+MB+TROPI)
CK, MB: 1.8 ng/mL (ref 0.3–4.0)
CK, MB: 1.6 ng/mL (ref 0.3–4.0)
Total CK: 79 U/L (ref 7–177)
Total CK: 63 U/L (ref 7–177)
Troponin I: <0.3 ng/mL (ref <0.30)

## 2011-09-14 LAB — MAGNESIUM: Magnesium: 2.2 mg/dL (ref 1.5–2.5)

## 2011-09-14 LAB — BASIC METABOLIC PANEL
CO2: 24 mEq/L (ref 19–32)
Chloride: 100 mEq/L (ref 96–112)
Glucose, Bld: 106 mg/dL — ABNORMAL HIGH (ref 70–99)
Potassium: 3.6 mEq/L (ref 3.5–5.1)
Sodium: 136 mEq/L (ref 135–145)

## 2011-09-14 LAB — HEPATIC FUNCTION PANEL
ALT: 45 U/L — ABNORMAL HIGH (ref 0–35)
Albumin: 3.9 g/dL (ref 3.5–5.2)
Alkaline Phosphatase: 108 U/L (ref 39–117)
Indirect Bilirubin: 0.3 mg/dL (ref 0.3–0.9)
Total Protein: 7.6 g/dL (ref 6.0–8.3)

## 2011-09-14 LAB — DIFFERENTIAL
Basophils Relative: 0 % (ref 0–1)
Eosinophils Absolute: 0.1 10*3/uL (ref 0.0–0.7)
Lymphs Abs: 3.7 10*3/uL (ref 0.7–4.0)
Monocytes Absolute: 1 10*3/uL (ref 0.1–1.0)
Monocytes Relative: 6 % (ref 3–12)

## 2011-09-14 MED ORDER — DEXTROSE 5 % IV SOLN
500.0000 mg | INTRAVENOUS | Status: DC
  Administered 2011-09-14 – 2011-09-15 (×2): 500 mg via INTRAVENOUS
  Filled 2011-09-14 (×5): qty 500

## 2011-09-14 MED ORDER — DEXTROSE 5 % IV SOLN
1.0000 g | INTRAVENOUS | Status: DC
  Administered 2011-09-14 – 2011-09-15 (×2): 1 g via INTRAVENOUS
  Filled 2011-09-14 (×5): qty 10

## 2011-09-14 MED ORDER — PNEUMOCOCCAL VAC POLYVALENT 25 MCG/0.5ML IJ INJ
0.5000 mL | INJECTION | INTRAMUSCULAR | Status: AC
  Filled 2011-09-14: qty 0.5

## 2011-09-14 MED ORDER — ALBUTEROL SULFATE (5 MG/ML) 0.5% IN NEBU
5.0000 mg | INHALATION_SOLUTION | Freq: Once | RESPIRATORY_TRACT | Status: AC
Start: 2011-09-14 — End: 2011-09-14
  Administered 2011-09-14: 5 mg via RESPIRATORY_TRACT
  Filled 2011-09-14: qty 1

## 2011-09-14 MED ORDER — GUAIFENESIN ER 600 MG PO TB12
600.0000 mg | ORAL_TABLET | Freq: Two times a day (BID) | ORAL | Status: DC
  Administered 2011-09-14 – 2011-09-16 (×5): 600 mg via ORAL
  Filled 2011-09-14 (×5): qty 1

## 2011-09-14 MED ORDER — ZOLPIDEM TARTRATE 5 MG PO TABS
10.0000 mg | ORAL_TABLET | Freq: Every evening | ORAL | Status: DC | PRN
  Administered 2011-09-15: 10 mg via ORAL
  Filled 2011-09-14: qty 2

## 2011-09-14 MED ORDER — DULOXETINE HCL 30 MG PO CPEP
30.0000 mg | ORAL_CAPSULE | Freq: Every day | ORAL | Status: DC
  Administered 2011-09-15 – 2011-09-16 (×2): 30 mg via ORAL
  Filled 2011-09-14 (×3): qty 1

## 2011-09-14 MED ORDER — ENOXAPARIN SODIUM 60 MG/0.6ML ~~LOC~~ SOLN
55.0000 mg | Freq: Every day | SUBCUTANEOUS | Status: DC
  Administered 2011-09-14 – 2011-09-16 (×3): 55 mg via SUBCUTANEOUS
  Filled 2011-09-14 (×4): qty 0.6

## 2011-09-14 MED ORDER — IOHEXOL 350 MG/ML SOLN
100.0000 mL | Freq: Once | INTRAVENOUS | Status: AC | PRN
  Administered 2011-09-14: 100 mL via INTRAVENOUS

## 2011-09-14 MED ORDER — HYDROCODONE-ACETAMINOPHEN 7.5-325 MG PO TABS
1.0000 | ORAL_TABLET | Freq: Four times a day (QID) | ORAL | Status: DC | PRN
  Administered 2011-09-15 – 2011-09-16 (×3): 1 via ORAL
  Filled 2011-09-14 (×2): qty 1
  Filled 2011-09-14: qty 2

## 2011-09-14 MED ORDER — IPRATROPIUM BROMIDE 0.02 % IN SOLN
0.5000 mg | Freq: Once | RESPIRATORY_TRACT | Status: AC
  Administered 2011-09-14: 0.5 mg via RESPIRATORY_TRACT
  Filled 2011-09-14: qty 2.5

## 2011-09-14 MED ORDER — ALBUTEROL SULFATE (5 MG/ML) 0.5% IN NEBU
2.5000 mg | INHALATION_SOLUTION | RESPIRATORY_TRACT | Status: DC | PRN
  Administered 2011-09-14: 2.5 mg via RESPIRATORY_TRACT
  Filled 2011-09-14: qty 0.5

## 2011-09-14 MED ORDER — ASPIRIN 81 MG PO CHEW
81.0000 mg | CHEWABLE_TABLET | Freq: Every day | ORAL | Status: DC
  Administered 2011-09-14 – 2011-09-16 (×3): 81 mg via ORAL
  Filled 2011-09-14 (×3): qty 1

## 2011-09-14 MED ORDER — METHYLPREDNISOLONE SODIUM SUCC 125 MG IJ SOLR
125.0000 mg | Freq: Three times a day (TID) | INTRAMUSCULAR | Status: DC
  Administered 2011-09-14 – 2011-09-16 (×6): 125 mg via INTRAVENOUS
  Filled 2011-09-14 (×6): qty 2

## 2011-09-14 MED ORDER — IPRATROPIUM-ALBUTEROL 0.5-2.5 (3) MG/3ML IN SOLN: 3.0000 mL | RESPIRATORY_TRACT | Status: DC | PRN

## 2011-09-14 MED ORDER — IPRATROPIUM BROMIDE 0.02 % IN SOLN
0.5000 mg | RESPIRATORY_TRACT | Status: DC | PRN
  Administered 2011-09-14: 0.5 mg via RESPIRATORY_TRACT
  Filled 2011-09-14: qty 2.5

## 2011-09-14 NOTE — ED Notes (Signed)
Pt reports being dx with pneumonia a week ago.  Pt reports taking all of her antibiotic but is still exhibiting sob.  Pt has hx of COPD and asthma.  Pt reports taking breathing treatments at home w/out relief.  Pt family reports pt running a fever this a.m of 100.0.  Pt reports taking some tylenol.  Pt in room on monitor.

## 2011-09-14 NOTE — ED Notes (Signed)
Dr. Estell Harpin in prior to RN, see EDP assessment for further,

## 2011-09-14 NOTE — ED Provider Notes (Signed)
History     CSN: [id number]  Arrival date & time 09/14/11  1247   First MD Initiated Contact with Patient 09/14/11 1335      Chief Complaint  Patient presents with  . Shortness of Breath  . Chest Pain    (Consider location/radiation/quality/duration/timing/severity/associated sxs/prior treatment) Patient is a 51 y.o. female presenting with shortness of breath and chest pain. The history is provided by the patient (The patient complains of shortness of breath and wheezing for a couple weeks now she's been on antibiotics and prednisone.). No language interpreter was used.  Shortness of Breath  The current episode started more than 1 week ago. The problem occurs frequently. The problem has been unchanged. The problem is moderate. The symptoms are relieved by nothing. Associated symptoms include chest pain and shortness of breath. Pertinent negatives include no cough.  Chest Pain Primary symptoms include shortness of breath. Pertinent negatives for primary symptoms include no fatigue, no cough and no abdominal pain.  Pertinent negatives for past medical history include no seizures.     Past Medical History  Diagnosis Date  . Asthma     History reviewed. No pertinent past surgical history.  No family history on file.  History  Substance Use Topics  . Smoking status: Not on file  . Smokeless tobacco: Not on file  . Alcohol Use: Not on file    OB History    Grav Para Term Preterm Abortions TAB SAB Ect Mult Living                  Review of Systems  Constitutional: Negative for fatigue.  HENT: Negative for congestion, sinus pressure and ear discharge.   Eyes: Negative for discharge.  Respiratory: Positive for shortness of breath. Negative for cough.   Cardiovascular: Positive for chest pain.  Gastrointestinal: Negative for abdominal pain and diarrhea.  Genitourinary: Negative for frequency and hematuria.  Musculoskeletal: Negative for back pain.  Skin: Negative for  rash.  Neurological: Negative for seizures and headaches.  Hematological: Negative.   Psychiatric/Behavioral: Negative for hallucinations.    Allergies  Review of patient's allergies indicates no known allergies.  Home Medications  No current outpatient prescriptions on file.  BP 125/73  Pulse 92  Temp(Src) 98.1 F (36.7 C) (Oral)  Resp 23  SpO2 97%  Physical Exam  Constitutional: She is oriented to person, place, and time. She appears well-developed.  HENT:  Head: Normocephalic and atraumatic.  Eyes: Conjunctivae and EOM are normal. No scleral icterus.  Neck: Neck supple. No thyromegaly present.  Cardiovascular: Normal rate and regular rhythm.  Exam reveals no gallop and no friction rub.   No murmur heard. Pulmonary/Chest: No stridor. She has wheezes. She has no rales. She exhibits no tenderness.  Abdominal: She exhibits no distension. There is no tenderness. There is no rebound.  Musculoskeletal: Normal range of motion. She exhibits no edema.  Lymphadenopathy:    She has no cervical adenopathy.  Neurological: She is oriented to person, place, and time. Coordination normal.  Skin: No rash noted. No erythema.  Psychiatric: She has a normal mood and affect. Her behavior is normal.    ED Course  Procedures (including critical care time)  Labs Reviewed  CBC - Abnormal; Notable for the following:    WBC 19.4 (*)    All other components within normal limits  BASIC METABOLIC PANEL - Abnormal; Notable for the following:    Glucose, Bld 106 (*)    All other components within normal limits  BLOOD GAS, ARTERIAL - Abnormal; Notable for the following:    pH, Arterial 7.650 (*)    pCO2 arterial 20.5 (*)    pO2, Arterial 55.9 (*)    All other components within normal limits  DIFFERENTIAL - Abnormal; Notable for the following:    Neutro Abs 13.9 (*)    All other components within normal limits   Dg Chest 2 View  09/14/2011  *RADIOLOGY REPORT*  Clinical Data: Cough, shortness  of breath.  CHEST - 2 VIEW  Comparison: None.  Findings: Heart and mediastinal contours are within normal limits. No focal opacities or effusions.  No acute bony abnormality.  IMPRESSION: No active cardiopulmonary disease.  Original Report Authenticated By: Cyndie Chime, M.D.     1. Hypoxia       MDM  Admit for hypoxia.  Dr. Janna Arch will admit and get ct scan       Benny Lennert, MD 09/14/11 [phone]

## 2011-09-14 NOTE — H&P (Signed)
[name], [name]             ACCOUNT NO.:  [id number]  MEDICAL RECORD NO.:  [id number]  LOCATION:  APA14                         FACILITY:  [facility]  PHYSICIAN:  Melvyn Novas, MDDATE OF BIRTH:  1960-09-26  DATE OF ADMISSION:  09/14/2011 DATE OF DISCHARGE:  LH                             HISTORY & PHYSICAL   The patient is a 51 year old, white female, who actively smokes 1 pack per day, who has had exacerbation bronchitis in the last 2-3 weeks, treated with Z-Pak and an unknown inhaler at home.  The patient reports complaining of some dyspnea, some cough, some greenish sputum.  She states she has cut down her cigarettes to 1-2 per day due to her current illness.  She was seen and found have a white count of 20,000 and an abnormal blood gas revealing hyperventilation with a pO2 of only 59, and a pCO2 of 20, consideration given to pulmonary embolism.  She does admit to some pleuritic chest pain for over a week's duration.  She also complains of some chest heaviness.  We will obtain cardiac enzymes and a stat CT angio of the chest to rule out PE.  She does deny hemoptysis. She denies orthopnea or PND.  PAST MEDICAL HISTORY:  Significant for anxiety disorder, insomnia, depression, degenerative joint disease, herniated nucleus pulposus of the back.  She is post menopausal surgically.  PAST SURGICAL HISTORY:  Remarkable for TAHBSO and a spinal fusion of her lumbosacral spine as well as a bladder tack.  She has no known allergies.  She lives with the husband and smokes 1 pack per day.  Does not imbibe alcohol.  REVIEW OF SYSTEMS:  Negative for polyuria, polydipsia, weight loss, seizures, tremors, nausea, vomiting, melena, hematemesis, hematochezia.  PHYSICAL EXAMINATION:  VITAL SIGNS:  Blood pressure is 138/74, temperature 97.7, respiratory rate is 20, pulse is 89 and regular. EYES:  PERRLA intact.  Sclerae clear.  Conjunctivae pink. NECK:  Shows no JVD.  No carotid  bruits, no thyromegaly, no thyroid bruits. LUNGS:  Show prolonged expiratory phase, scattered rhonchi.  No wheeze appreciable.  No rales appreciable. HEART:  Regular rhythm.  No S3, S4, gallops.  No heaves, thrills, or rubs.  ABDOMEN:  Obese, soft, nontender.  Bowel sounds normoactive.  No guarding, rebound, mass, or organomegaly. EXTREMITIES:  No evidence of DVT.  No palpable cord.  Homans sign negative.  No induration or erythema and no edema. NEUROLOGIC:  Cranial nerves II through XII grossly intact.  Patient moves all 4 extremities.  Plantars are downgoing.  Blood gases show pH is 7.65, pCO2 of 20.5, and pO2 of 55.9, hypoxic and hyperventilating.  WBCs of 19.4, creatinine is 0.68.  Impression is as follows: 1. Acute bronchitis exacerbation on top of chronic. 2. Consider pulmonary embolism. 3. Consider angina pectoris. 4. Anxiety. 5. Depression. 6. 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  Plan right now is to put her on Rocephin and Zithromax empirically, methylprednisolone 125 IV q.8 hours, Mucinex 600 q.12h.  Obtain stat CT angio, stat cardiac enzymes, and EKG.  Make further recommendations as the database expands.     Melvyn Novas, MD  RMD/MEDQ  D:  09/14/2011  T:  09/14/2011  Job:  [id number]

## 2011-09-14 NOTE — H&P (Signed)
[id number] 

## 2011-09-14 NOTE — ED Notes (Signed)
Dr. Dondiego here to evaluate pt for admission 

## 2011-09-15 LAB — CARDIAC PANEL(CRET KIN+CKTOT+MB+TROPI)
CK, MB: 1.5 ng/mL (ref 0.3–4.0)
Relative Index: INVALID (ref 0.0–2.5)
Total CK: 61 U/L (ref 7–177)

## 2011-09-15 LAB — LIPASE, BLOOD: Lipase: 55 U/L (ref 11–59)

## 2011-09-15 LAB — BLOOD GAS, ARTERIAL
Acid-Base Excess: 0.5 mmol/L (ref 0.0–2.0)
Drawn by: 22223
O2 Content: 3 L/min
Patient temperature: 37
TCO2: 21.8 mmol/L (ref 0–100)

## 2011-09-15 MED ORDER — SODIUM CHLORIDE 0.9 % IJ SOLN
INTRAMUSCULAR | Status: AC
  Administered 2011-09-15: 3 mL
  Filled 2011-09-15: qty 3

## 2011-09-15 MED ORDER — SIMVASTATIN 20 MG PO TABS
20.0000 mg | ORAL_TABLET | Freq: Every day | ORAL | Status: DC
  Administered 2011-09-15: 20 mg via ORAL
  Filled 2011-09-15: qty 1

## 2011-09-15 MED ORDER — SODIUM CHLORIDE 0.9 % IJ SOLN
INTRAMUSCULAR | Status: AC
  Administered 2011-09-15: 22:00:00
  Filled 2011-09-15: qty 3

## 2011-09-15 NOTE — Progress Notes (Signed)
[id number] 

## 2011-09-15 NOTE — Plan of Care (Signed)
Problem: Phase I Progression Outcomes Goal: Initial discharge plan identified Outcome: Completed/Met Date Met:  09/15/11 Plan to discharge on Saturday,09-17-2011.

## 2011-09-16 LAB — LIPASE, BLOOD: Lipase: 96 U/L — ABNORMAL HIGH (ref 11–59)

## 2011-09-16 LAB — CARDIAC PANEL(CRET KIN+CKTOT+MB+TROPI): Relative Index: INVALID (ref 0.0–2.5)

## 2011-09-16 MED ORDER — DULOXETINE HCL 30 MG PO CPEP: 30.0000 mg | ORAL_CAPSULE | Freq: Every day | ORAL | Status: DC

## 2011-09-16 MED ORDER — ASPIRIN 81 MG PO CHEW: 81.0000 mg | CHEWABLE_TABLET | Freq: Every day | ORAL | Status: AC

## 2011-09-16 MED ORDER — ALBUTEROL SULFATE (5 MG/ML) 0.5% IN NEBU: 2.5000 mg | INHALATION_SOLUTION | RESPIRATORY_TRACT | Status: AC | PRN

## 2011-09-16 MED ORDER — SIMVASTATIN 20 MG PO TABS: 20.0000 mg | ORAL_TABLET | Freq: Every day | ORAL | Status: AC

## 2011-09-16 MED ORDER — AZITHROMYCIN 250 MG PO TABS: ORAL_TABLET | ORAL | Status: AC

## 2011-09-16 MED ORDER — PNEUMOCOCCAL VAC POLYVALENT 25 MCG/0.5ML IJ INJ
0.5000 mL | INJECTION | Freq: Once | INTRAMUSCULAR | Status: AC
  Administered 2011-09-16: 0.5 mL via INTRAMUSCULAR
  Filled 2011-09-16: qty 0.5

## 2011-09-16 MED ORDER — IPRATROPIUM BROMIDE 0.02 % IN SOLN: 0.5000 mg | RESPIRATORY_TRACT | Status: DC | PRN

## 2011-09-16 NOTE — Discharge Summary (Signed)
[id number] 

## 2011-09-16 NOTE — Plan of Care (Signed)
Problem: Discharge Progression Outcomes Goal: Hemodynamically stable Outcome: Completed/Met Date Met:  09/16/11 o2 95% on room air no SOB with ambulation Goal: Vaccine documented on D/C instructions Outcome: Completed/Met Date Met:  09/16/11 Pneumonia vacine given to pt prior to discharge Goal: Other Discharge Outcomes/Goals Outcome: Completed/Met Date Met:  09/16/11 IV removed from rt arm cath tip intact Discharge instructions read to pt  RXs given Pt verbalized understanding of all instructions

## 2011-09-16 NOTE — Discharge Summary (Signed)
[name], CID             ACCOUNT NO.:  [id number]  MEDICAL RECORD NO.:  [id number]  LOCATION:  A323                          FACILITY:  [facility]  PHYSICIAN:  Melvyn Novas, MDDATE OF BIRTH:  1961/01/01  DATE OF ADMISSION:  09/14/2011 DATE OF DISCHARGE:  03/08/2013LH                              DISCHARGE SUMMARY   The patient was admitted with acute diagnosis of asthmatic bronchitis, hyperventilation in the ER, hypoxia and some respiratory alkalosis.  The patient smoked 1 pack per day.  She has history of anxiety and depression, chronic pain syndrome, and hyperlipidemia.  The patient had active wheezing and was given IV Solu-Medrol 125 IV q.8 hours along with Rocephin and Zithromax empirically.  Chest x-ray did not reveal any infiltrate.  She responded rather dramatically with 72-hour period and had no respiratory distress, no wheezing, was totally quiescent.  Chest CT angio of the chest which was negative for pulmonary emboli, however revealed commented on calcification of coronary arteries, specifically the LAD, was not gated study, not designed for this, but it was mentioned to the patient that she needs to quit smoking and take baby aspirin and take her statin drug daily and mostly to quit smoking, she was given a prescription for Chantix a week prior to admission, she will hopefully fill this.  She understands the importance.  She was subsequently discharged on the following medicines: 1. Albuterol and ipratropium nebulizer solution combined q.i.d. to     q.4h p.r.n. 2. Aspirin 81 mg per day. 3. Zithromax 250 mg 3 additional tablets 1 p.o. daily. 4. Cymbalta 30 mg p.o. daily. 5. Simvastatin 20 mg p.o. daily. 6. Aspirin 81 mg p.o. daily.  She will follow up in my office to encourage and supervise smoking cessation therapy with Chantix already prescribed.     Melvyn Novas, MD     RMD/MEDQ  D:  09/16/2011  T:  09/16/2011  Job:  [id number]

## 2011-09-16 NOTE — Progress Notes (Signed)
[name], [name]             ACCOUNT NO.:  [id number]  MEDICAL RECORD NO.:  [id number]  LOCATION:  A323                          FACILITY:  [facility]  PHYSICIAN:  Melvyn Novas, MDDATE OF BIRTH:  May 08, 1961  DATE OF PROCEDURE: DATE OF DISCHARGE:                                PROGRESS NOTE   The patient admitted with asthmatic bronchitis exacerbation and now in minimal respiratory distress, much improved, less wheezing, currently on Zithromax and Rocephin as well as Solu-Medrol IV and DuoNeb nebulizers and Mucinex.  CT angio of the chest was negative for acute pulmonary emboli.  However, they did note significant coronary artery LAD calcification.  It was not a gated study, not desired for that, but the patient does smoke 1 pack per day.  She was admonished to fill her Chantix, which we had written for her 2 weeks ago and to continue with anti-lipid medications as well as aspirin 81 mg per day.  She seems to understand this and voices a desire to quit smoking.  PHYSICAL EXAMINATION:  VITAL SIGNS:  Blood pressure today is 138/68, temperature 97.6, respiratory rate is 20, O2 sat 96% on 2 L, pH 7.40, pCO2 of 39.8 and pO2 is 76, O2 sat is 96%. NECK:  No JVD. LUNGS:  Minimal end-expiratory wheeze.  Scattered rhonchi.  No rales.  WBC was 19.4, hemoglobin 14.9.  Cardiac enzymes were negative x3.  The plan right now is to continue Rocephin, Zithromax empirically, Solu- Medrol 125 IV q.6 h., DuoNeb nebulizer.  We will diminish Solu-Medrol tomorrow if she remains clinically improved, and continue Pravachol 40 mg p.o. daily.     Melvyn Novas, MD     RMD/MEDQ  D:  09/15/2011  T:  09/15/2011  Job:  [id number]

## 2011-11-10 ENCOUNTER — Other Ambulatory Visit (HOSPITAL_COMMUNITY): Payer: Self-pay | Admitting: Family Medicine

## 2011-11-10 ENCOUNTER — Ambulatory Visit (HOSPITAL_COMMUNITY)
Admission: RE | Admit: 2011-11-10 | Discharge: 2011-11-10 | Disposition: A | Discharge status: Home / Self Care | Payer: Medicare Other | Source: Ambulatory Visit | Attending: Family Medicine | Admitting: Family Medicine

## 2011-11-10 DIAGNOSIS — M545 Low back pain, unspecified: Secondary | ICD-10-CM | POA: Insufficient documentation

## 2011-11-10 DIAGNOSIS — Z981 Arthrodesis status: Secondary | ICD-10-CM | POA: Insufficient documentation

## 2011-11-10 DIAGNOSIS — R52 Pain, unspecified: Secondary | ICD-10-CM

## 2012-04-30 ENCOUNTER — Other Ambulatory Visit (HOSPITAL_COMMUNITY): Payer: Self-pay | Admitting: Neurological Surgery

## 2012-04-30 DIAGNOSIS — M47812 Spondylosis without myelopathy or radiculopathy, cervical region: Secondary | ICD-10-CM

## 2012-05-02 ENCOUNTER — Ambulatory Visit (HOSPITAL_COMMUNITY): Payer: Medicare Other

## 2012-05-03 ENCOUNTER — Ambulatory Visit (HOSPITAL_COMMUNITY)
Admission: RE | Admit: 2012-05-03 | Discharge: 2012-05-03 | Disposition: A | Discharge status: Home / Self Care | Payer: Medicare Other | Source: Ambulatory Visit | Attending: Neurological Surgery | Admitting: Neurological Surgery

## 2012-05-03 DIAGNOSIS — M47812 Spondylosis without myelopathy or radiculopathy, cervical region: Secondary | ICD-10-CM

## 2012-05-03 DIAGNOSIS — IMO0002 Reserved for concepts with insufficient information to code with codable children: Secondary | ICD-10-CM | POA: Insufficient documentation

## 2012-07-26 ENCOUNTER — Other Ambulatory Visit: Payer: Self-pay | Admitting: Obstetrics & Gynecology

## 2012-07-26 DIAGNOSIS — Z09 Encounter for follow-up examination after completed treatment for conditions other than malignant neoplasm: Secondary | ICD-10-CM

## 2012-08-10 ENCOUNTER — Ambulatory Visit (HOSPITAL_COMMUNITY)
Admission: RE | Admit: 2012-08-10 | Discharge: 2012-08-10 | Disposition: A | Discharge status: Home / Self Care | Payer: Medicare Other | Source: Ambulatory Visit | Attending: Obstetrics & Gynecology | Admitting: Obstetrics & Gynecology

## 2012-08-10 DIAGNOSIS — Z1231 Encounter for screening mammogram for malignant neoplasm of breast: Secondary | ICD-10-CM | POA: Insufficient documentation

## 2012-08-10 DIAGNOSIS — Z09 Encounter for follow-up examination after completed treatment for conditions other than malignant neoplasm: Secondary | ICD-10-CM

## 2012-09-26 ENCOUNTER — Emergency Department (HOSPITAL_COMMUNITY): Payer: Medicare Other

## 2012-09-26 ENCOUNTER — Encounter (HOSPITAL_COMMUNITY): Payer: Self-pay

## 2012-09-26 ENCOUNTER — Emergency Department (HOSPITAL_COMMUNITY)
Admission: EM | Admit: 2012-09-26 | Discharge: 2012-09-26 | Disposition: A | Discharge status: Home / Self Care | Payer: Medicare Other | Attending: Emergency Medicine | Admitting: Emergency Medicine

## 2012-09-26 DIAGNOSIS — X500XXA Overexertion from strenuous movement or load, initial encounter: Secondary | ICD-10-CM | POA: Insufficient documentation

## 2012-09-26 DIAGNOSIS — S93409A Sprain of unspecified ligament of unspecified ankle, initial encounter: Secondary | ICD-10-CM | POA: Insufficient documentation

## 2012-09-26 DIAGNOSIS — Y9389 Activity, other specified: Secondary | ICD-10-CM | POA: Insufficient documentation

## 2012-09-26 DIAGNOSIS — F17200 Nicotine dependence, unspecified, uncomplicated: Secondary | ICD-10-CM | POA: Insufficient documentation

## 2012-09-26 DIAGNOSIS — Y929 Unspecified place or not applicable: Secondary | ICD-10-CM | POA: Insufficient documentation

## 2012-09-26 DIAGNOSIS — W19XXXA Unspecified fall, initial encounter: Secondary | ICD-10-CM | POA: Insufficient documentation

## 2012-09-26 MED ORDER — HYDROCODONE-ACETAMINOPHEN 5-325 MG PO TABS: 1.0000 | ORAL_TABLET | ORAL | Status: DC | PRN

## 2012-09-26 MED ORDER — PROMETHAZINE HCL 12.5 MG PO TABS
12.5000 mg | ORAL_TABLET | Freq: Once | ORAL | Status: AC
  Administered 2012-09-26: 12.5 mg via ORAL
  Filled 2012-09-26: qty 1

## 2012-09-26 MED ORDER — HYDROCODONE-ACETAMINOPHEN 5-325 MG PO TABS
2.0000 | ORAL_TABLET | Freq: Once | ORAL | Status: AC
  Administered 2012-09-26: 2 via ORAL
  Filled 2012-09-26: qty 2

## 2012-09-26 MED ORDER — IBUPROFEN 800 MG PO TABS
800.0000 mg | ORAL_TABLET | Freq: Once | ORAL | Status: AC
  Administered 2012-09-26: 800 mg via ORAL
  Filled 2012-09-26: qty 1

## 2012-09-26 MED ORDER — IBUPROFEN 800 MG PO TABS: 800.0000 mg | ORAL_TABLET | Freq: Three times a day (TID) | ORAL | Status: DC

## 2012-09-26 NOTE — Discharge Instructions (Signed)
Ankle Sprain  An ankle sprain is an injury to the strong, fibrous tissues (ligaments) that hold the bones of your ankle joint together.   CAUSES  An ankle sprain is usually caused by a fall or by twisting your ankle. Ankle sprains most commonly occur when you step on the outer edge of your foot, and your ankle turns inward. People who participate in sports are more prone to these types of injuries.   SYMPTOMS    Pain in your ankle. The pain may be present at rest or only when you are trying to stand or walk.   Swelling.   Bruising. Bruising may develop immediately or within 1 to 2 days after your injury.   Difficulty standing or walking, particularly when turning corners or changing directions.  DIAGNOSIS   Your caregiver will ask you details about your injury and perform a physical exam of your ankle to determine if you have an ankle sprain. During the physical exam, your caregiver will press on and apply pressure to specific areas of your foot and ankle. Your caregiver will try to move your ankle in certain ways. An X-ray exam may be done to be sure a bone was not broken or a ligament did not separate from one of the bones in your ankle (avulsion fracture).   TREATMENT   Certain types of braces can help stabilize your ankle. Your caregiver can make a recommendation for this. Your caregiver may recommend the use of medicine for pain. If your sprain is severe, your caregiver may refer you to a surgeon who helps to restore function to parts of your skeletal system (orthopedist) or a physical therapist.  HOME CARE INSTRUCTIONS    Apply ice to your injury for 1 to 2 days or as directed by your caregiver. Applying ice helps to reduce inflammation and pain.   Put ice in a plastic bag.   Place a towel between your skin and the bag.   Leave the ice on for 15 to 20 minutes at a time, every 2 hours while you are awake.   Only take over-the-counter or prescription medicines for pain, discomfort, or fever as directed  by your caregiver.   Keep your injured leg elevated, when possible, to lessen swelling.   If your caregiver recommends crutches, use them as instructed. Gradually put weight on the affected ankle. Continue to use crutches or a cane until you can walk without feeling pain in your ankle.   If you have a plaster splint, wear the splint as directed by your caregiver. Do not rest it on anything harder than a pillow for the first 24 hours. Do not put weight on it. Do not get it wet. You may take it off to take a shower or bath.   You may have been given an elastic bandage to wear around your ankle to provide support. If the elastic bandage is too tight (you have numbness or tingling in your foot or your foot becomes cold and blue), adjust the bandage to make it comfortable.   If you have an air splint, you may blow more air into it or let air out to make it more comfortable. You may take your splint off at night and before taking a shower or bath.   Wiggle your toes in the splint several times per day to decrease swelling.  SEEK MEDICAL CARE IF:    You have an increase in bruising, swelling, or pain.   Your toes feel extremely cold   or you lose feeling in your foot.   Your pain is not relieved with medicine.  SEEK IMMEDIATE MEDICAL CARE IF:   Your toes are numb or blue.   You have severe pain.  MAKE SURE YOU:    Understand these instructions.   Will watch your condition.   Will get help right away if you are not doing well or get worse.  Document Released: 06/27/2005 Document Revised: 09/19/2011 Document Reviewed: 07/09/2011  [employer] Patient Information 2013 [employer].

## 2012-09-26 NOTE — ED Notes (Signed)
Pt alert & oriented x4. Patient given discharge instructions, paperwork & prescription(s). Patient instructed to stop at the registration desk to finish any additional paperwork. Patient verbalized understanding. Pt left department w/ no further questions. 

## 2012-09-26 NOTE — ED Notes (Signed)
Went to stand up and leg was "asleep" ankle twisted and pt fell. C/o right ankle pain

## 2012-09-27 NOTE — ED Provider Notes (Signed)
History     CSN: [id number]  Arrival date & time 09/26/12  2016   First MD Initiated Contact with Patient 09/26/12 2143      Chief Complaint  Patient presents with  . Ankle Injury    (Consider location/radiation/quality/duration/timing/severity/associated sxs/prior treatment) Patient is a 52 y.o. female presenting with lower extremity injury. The history is provided by the patient.  Ankle Injury This is a new problem. The current episode started today. The problem has been unchanged. Associated symptoms include arthralgias and numbness. Pertinent negatives include no abdominal pain, chest pain, coughing or neck pain. The symptoms are aggravated by standing and walking. She has tried nothing for the symptoms. The treatment provided no relief.    Past Medical History  Diagnosis Date  . Asthma   . Hyperlipemia     Past Surgical History  Procedure Laterality Date  . Back surgery      pins  . Abdominal hysterectomy    . Bladder repair    . Back surgery  2010    History reviewed. No pertinent family history.  History  Substance Use Topics  . Smoking status: Current Every Day Smoker -- 1.50 packs/day for 20 years    Types: Cigarettes  . Smokeless tobacco: Not on file  . Alcohol Use: No    OB History   Grav Para Term Preterm Abortions TAB SAB Ect Mult Living                  Review of Systems  Constitutional: Negative for activity change.       All ROS Neg except as noted in HPI  HENT: Negative for nosebleeds and neck pain.   Eyes: Negative for photophobia and discharge.  Respiratory: Positive for wheezing. Negative for cough and shortness of breath.   Cardiovascular: Negative for chest pain and palpitations.  Gastrointestinal: Negative for abdominal pain and blood in stool.  Genitourinary: Negative for dysuria, frequency and hematuria.  Musculoskeletal: Positive for arthralgias. Negative for back pain.  Skin: Negative.   Neurological: Positive for numbness.  Negative for dizziness, seizures and speech difficulty.  Psychiatric/Behavioral: Negative for hallucinations and confusion.    Allergies  Review of patient's allergies indicates no known allergies.  Home Medications   Current Outpatient Rx  Name  Route  Sig  Dispense  Refill  . EXPIRED: DULoxetine (CYMBALTA) 30 MG capsule   Oral   Take 1 capsule (30 mg total) by mouth daily.   30 capsule   3   . HYDROcodone-acetaminophen (NORCO/VICODIN) 5-325 MG per tablet   Oral   Take 1 tablet by mouth every 4 (four) hours as needed for pain.   15 tablet   0   . ibuprofen (ADVIL,MOTRIN) 800 MG tablet   Oral   Take 1 tablet (800 mg total) by mouth 3 (three) times daily.   21 tablet   0   . EXPIRED: ipratropium (ATROVENT) 0.02 % nebulizer solution   Nebulization   Take 2.5 mLs (0.5 mg total) by nebulization every 4 (four) hours as needed.   75 mL   2     BP 132/88  Pulse 85  Temp(Src) 97.6 F (36.4 C) (Oral)  Resp 20  Ht 5\' 3"  (1.6 m)  Wt 195 lb (88.451 kg)  BMI 34.55 kg/m2  SpO2 98%  Physical Exam  Nursing note and vitals reviewed. Constitutional: She is oriented to person, place, and time. She appears well-developed and well-nourished.  Non-toxic appearance.  HENT:  Head: Normocephalic.  Right  Ear: Tympanic membrane and external ear normal.  Left Ear: Tympanic membrane and external ear normal.  Eyes: EOM and lids are normal. Pupils are equal, round, and reactive to light.  Neck: Normal range of motion. Neck supple. Carotid bruit is not present.  Cardiovascular: Normal rate, regular rhythm, normal heart sounds, intact distal pulses and normal pulses.   Pulmonary/Chest: Breath sounds normal. No respiratory distress.  Abdominal: Soft. Bowel sounds are normal. There is no tenderness. There is no guarding.  Musculoskeletal: Normal range of motion.  There is soreness with ROM of the right hip. Stiffness of the right knee. Pain with attempted ROm of theright ankle. Mild to mod  swelling. Achilles intact. CAP refill wnl. Distal pulse 2+.  Lymphadenopathy:       Head (right side): No submandibular adenopathy present.       Head (left side): No submandibular adenopathy present.    She has no cervical adenopathy.  Neurological: She is alert and oriented to person, place, and time. She has normal strength. No cranial nerve deficit or sensory deficit.  Skin: Skin is warm and dry.  Psychiatric: She has a normal mood and affect. Her speech is normal.    ED Course  Procedures (including critical care time)  Labs Reviewed - No data to display Dg Ankle Complete Right  09/26/2012  *RADIOLOGY REPORT*  Clinical Data: Twisting injury to the right ankle with diffuse pain.  RIGHT ANKLE - COMPLETE 3+ VIEW  Comparison: None.  Findings: No evidence of acute or subacute fracture or dislocation. Ankle mortise intact with well-preserved joint space.  No intrinsic osseous abnormalities.  No evidence of a significant joint effusion.  IMPRESSION: Normal examination.   Original Report Authenticated By: Hulan Saas, M.D.      1. Ankle sprain and strain, right, initial encounter       MDM  I have reviewed nursing notes, vital signs, and all appropriate lab and imaging results for this patient. Xray of the right ankle is negative for fx or dislocation. Pt fitted with ASO. Referred to orthopedics. Rx for ibuprofen and norco given to the patient.       Kathie Dike, PA-C 09/27/12 1646

## 2012-10-02 NOTE — ED Provider Notes (Signed)
Medical screening examination/treatment/procedure(s) were performed by non-physician practitioner and as supervising physician I was immediately available for consultation/collaboration. Devoria Albe, MD, [facility] Gang   Ward Givens, MD 10/02/12 [phone]

## 2013-03-07 ENCOUNTER — Other Ambulatory Visit (HOSPITAL_COMMUNITY): Payer: Self-pay | Admitting: Obstetrics & Gynecology

## 2013-04-15 ENCOUNTER — Encounter (INDEPENDENT_AMBULATORY_CARE_PROVIDER_SITE_OTHER): Payer: Self-pay | Admitting: *Deleted

## 2013-04-17 ENCOUNTER — Encounter (INDEPENDENT_AMBULATORY_CARE_PROVIDER_SITE_OTHER): Payer: Self-pay | Admitting: *Deleted

## 2013-04-17 ENCOUNTER — Telehealth (INDEPENDENT_AMBULATORY_CARE_PROVIDER_SITE_OTHER): Payer: Self-pay | Admitting: *Deleted

## 2013-04-17 ENCOUNTER — Other Ambulatory Visit (INDEPENDENT_AMBULATORY_CARE_PROVIDER_SITE_OTHER): Payer: Self-pay | Admitting: *Deleted

## 2013-04-17 DIAGNOSIS — Z1211 Encounter for screening for malignant neoplasm of colon: Secondary | ICD-10-CM

## 2013-04-17 MED ORDER — PEG-KCL-NACL-NASULF-NA ASC-C 100 G PO SOLR: 1.0000 | Freq: Once | ORAL | Status: DC

## 2013-04-17 NOTE — Telephone Encounter (Signed)
Patient needs movi prep 

## 2013-05-23 ENCOUNTER — Telehealth (INDEPENDENT_AMBULATORY_CARE_PROVIDER_SITE_OTHER): Payer: Self-pay | Admitting: *Deleted

## 2013-05-23 NOTE — Telephone Encounter (Signed)
agree

## 2013-05-23 NOTE — Telephone Encounter (Signed)
  Procedure: tcs  Reason/Indication:  screening  Has patient had this procedure before?  no  If so, when, by whom and where?    Is there a family history of colon cancer?  no  Who?  What age when diagnosed?    Is patient diabetic?   no      Does patient have prosthetic heart valve?  no  Do you have a pacemaker?  no  Has patient ever had endocarditis? no  Has patient had joint replacement within last 12 months?  no  Does patient tend to be constipated or take laxatives? ocassioanlly  Is patient on Coumadin, Plavix and/or Aspirin? no  Medications: trazadone 100 mg 1/2 tab at bedtime, pravastatin 40 mg daily, hydroxyzine 10 mg tid, naprosin 700 mg bid, cetirizine 10 mg daily, estradiol, 2 mg nightly  Allergies: nkda  Medication Adjustment:   Procedure date & time: 06/12/13 at 930

## 2013-05-29 ENCOUNTER — Encounter (HOSPITAL_COMMUNITY): Payer: Self-pay | Admitting: Pharmacy Technician

## 2013-06-12 ENCOUNTER — Ambulatory Visit (HOSPITAL_COMMUNITY)
Admission: RE | Admit: 2013-06-12 | Discharge: 2013-06-12 | Disposition: A | Discharge status: Home / Self Care | Payer: Medicare Other | Source: Ambulatory Visit | Attending: Internal Medicine | Admitting: Internal Medicine

## 2013-06-12 ENCOUNTER — Encounter (HOSPITAL_COMMUNITY)
Admission: RE | Disposition: A | Discharge status: Home / Self Care | Payer: Self-pay | Source: Ambulatory Visit | Attending: Internal Medicine

## 2013-06-12 ENCOUNTER — Encounter (HOSPITAL_COMMUNITY): Payer: Self-pay | Admitting: *Deleted

## 2013-06-12 DIAGNOSIS — K5730 Diverticulosis of large intestine without perforation or abscess without bleeding: Secondary | ICD-10-CM | POA: Insufficient documentation

## 2013-06-12 DIAGNOSIS — Z1211 Encounter for screening for malignant neoplasm of colon: Secondary | ICD-10-CM | POA: Insufficient documentation

## 2013-06-12 DIAGNOSIS — E785 Hyperlipidemia, unspecified: Secondary | ICD-10-CM | POA: Insufficient documentation

## 2013-06-12 HISTORY — DX: Major depressive disorder, single episode, unspecified: F32.9

## 2013-06-12 HISTORY — DX: Anxiety disorder, unspecified: F41.9

## 2013-06-12 HISTORY — DX: Depression, unspecified: F32.A

## 2013-06-12 HISTORY — PX: COLONOSCOPY: SHX5424

## 2013-06-12 SURGERY — COLONOSCOPY
Anesthesia: Moderate Sedation

## 2013-06-12 MED ORDER — STERILE WATER FOR IRRIGATION IR SOLN
Status: DC | PRN
  Administered 2013-06-12: 11:00:00

## 2013-06-12 MED ORDER — MEPERIDINE HCL 50 MG/ML IJ SOLN
INTRAMUSCULAR | Status: DC | PRN
  Administered 2013-06-12 (×2): 25 mg via INTRAVENOUS

## 2013-06-12 MED ORDER — MEPERIDINE HCL 50 MG/ML IJ SOLN
INTRAMUSCULAR | Status: AC
  Filled 2013-06-12: qty 1

## 2013-06-12 MED ORDER — MIDAZOLAM HCL 5 MG/5ML IJ SOLN
INTRAMUSCULAR | Status: AC
  Filled 2013-06-12: qty 10

## 2013-06-12 MED ORDER — SODIUM CHLORIDE 0.9 % IV SOLN
INTRAVENOUS | Status: DC
  Administered 2013-06-12: 10:00:00 via INTRAVENOUS

## 2013-06-12 MED ORDER — MIDAZOLAM HCL 5 MG/5ML IJ SOLN
INTRAMUSCULAR | Status: DC | PRN
  Administered 2013-06-12 (×4): 2 mg via INTRAVENOUS

## 2013-06-12 NOTE — Op Note (Addendum)
COLONOSCOPY PROCEDURE REPORT  PATIENT:  [name]  MR#:  [id number] Birthdate:  11-21-1960, 51 y.o., female Endoscopist:  Dr. Malissa Hippo, MD Referred By:  Dr. Isabella Stalling, M.D  Procedure Date: 06/12/2013  Procedure:   Colonoscopy  Indications: Patient is 52 year old Caucasian female is undergoing average risk screening colonoscopy.  Informed Consent:  The procedure and risks were reviewed with the patient and informed consent was obtained.  Medications:  Demerol 50 mg IV Versed 8 mg IV  Description of procedure:  After a digital rectal exam was performed, that colonoscope was advanced from the anus through the rectum and colon to the area of the cecum, ileocecal valve and appendiceal orifice. The cecum was deeply intubated. These structures were well-seen and photographed for the record. From the level of the cecum and ileocecal valve, the scope was slowly and cautiously withdrawn. The mucosal surfaces were carefully surveyed utilizing scope tip to flexion to facilitate fold flattening as needed. The scope was pulled down into the rectum where a thorough exam including retroflexion was performed. Terminal ileum was also examined.  Findings:   Prep satisfactory. Normal mucosa of terminal ileum. Few small diverticula at ascending colon. Normal rectal mucosa and anal rectal junction.   Therapeutic/Diagnostic Maneuvers Performed: None  Complications:  None  Cecal Withdrawal Time:  16 minutes  Impression:  Few small diverticula at ascending colon otherwise normal colonoscopy. Normal mucosa of terminal ileum.  Recommendations:  Standard instructions given. Next screening exam in 10 years.   Trysten Berti U  06/12/2013 11:02 AM  CC: Dr. Isabella Stalling, MD & Dr. Bonnetta Barry ref. provider found

## 2013-06-12 NOTE — H&P (Signed)
[name] is an 52 y.o. female.   Chief Complaint: Patient is here for colonoscopy. HPI: Patient is 38 Caucasian female who is here for screening colonoscopy. She denies abdominal pain change in bowel habits or rectal bleeding. Family history is positive for colon carcinoma in maternal grandmother who was in her 71s at the time of diagnosis. Patient does not have a first degree relative with history of CRC.  Past Medical History  Diagnosis Date  . Asthma   . Hyperlipemia   . Depression   . Anxiety     Past Surgical History  Procedure Laterality Date  . Back surgery      pins  . Abdominal hysterectomy    . Bladder repair    . Back surgery  2010  . Cesarean section  1982, 1984    Family History  Problem Relation Age of Onset  . Colon cancer Other    Social History:  reports that she has been smoking Cigarettes.  She has a 30 pack-year smoking history. She does not have any smokeless tobacco history on file. She reports that she does not drink alcohol or use illicit drugs.  Allergies: No Known Allergies  Medications Prior to Admission  Medication Sig Dispense Refill  . DULoxetine (CYMBALTA) 30 MG capsule Take 30 mg by mouth daily. Takes along with 60 mg tab to equal 90 mg.      . DULoxetine (CYMBALTA) 60 MG capsule Take 90 mg by mouth daily. Takes along with 30 mg to equal 90 mg      . estradiol (ESTRACE) 2 MG tablet TAKE ONE TABLET BY MOUTH ONCE DAILY.  30 tablet  11  . HYDROcodone-acetaminophen (NORCO/VICODIN) 5-325 MG per tablet Take 1 tablet by mouth every 4 (four) hours as needed for pain.  15 tablet  0  . ibuprofen (ADVIL,MOTRIN) 800 MG tablet Take 1 tablet (800 mg total) by mouth 3 (three) times daily.  21 tablet  0  . ipratropium (ATROVENT) 0.02 % nebulizer solution Take 2.5 mLs (0.5 mg total) by nebulization every 4 (four) hours as needed.  75 mL  2  . naproxen (NAPROSYN) 500 MG tablet Take 500 mg by mouth 2 (two) times daily with a meal.      . pravastatin  (PRAVACHOL) 40 MG tablet Take 40 mg by mouth daily.      . traMADol (ULTRAM) 50 MG tablet Take 50 mg by mouth at bedtime as needed for moderate pain.        No results found for this or any previous visit (from the past 48 hour(s)). No results found.  ROS  Blood pressure 124/63, pulse 81, temperature 97.6 F (36.4 C), temperature source Oral, resp. rate 14, height 5\' 3"  (1.6 m), weight 210 lb (95.255 kg), SpO2 92.00%. Physical Exam  Constitutional: She appears well-developed and well-nourished.  HENT:  Mouth/Throat: Oropharynx is clear and moist.  Eyes: Conjunctivae are normal. No scleral icterus.  Neck: No thyromegaly present.  Cardiovascular: Normal rate, regular rhythm and normal heart sounds.   No murmur heard. Respiratory: Effort normal and breath sounds normal.  GI: Soft. She exhibits no distension and no mass. There is no tenderness.  Musculoskeletal: She exhibits no edema.  Lymphadenopathy:    She has no cervical adenopathy.  Neurological: She is alert.  Skin: Skin is warm and dry.     Assessment/Plan Average risk screening colonoscopy.  Sriyan Cutting U 06/12/2013, 10:25 AM

## 2013-06-13 ENCOUNTER — Telehealth (INDEPENDENT_AMBULATORY_CARE_PROVIDER_SITE_OTHER): Payer: Self-pay | Admitting: Internal Medicine

## 2013-06-13 ENCOUNTER — Emergency Department (HOSPITAL_COMMUNITY): Payer: Medicare Other

## 2013-06-13 ENCOUNTER — Encounter (HOSPITAL_COMMUNITY): Payer: Self-pay | Admitting: Emergency Medicine

## 2013-06-13 ENCOUNTER — Emergency Department (HOSPITAL_COMMUNITY)
Admission: EM | Admit: 2013-06-13 | Discharge: 2013-06-13 | Disposition: A | Discharge status: Home / Self Care | Payer: Medicare Other | Attending: Emergency Medicine | Admitting: Emergency Medicine

## 2013-06-13 DIAGNOSIS — F17200 Nicotine dependence, unspecified, uncomplicated: Secondary | ICD-10-CM | POA: Insufficient documentation

## 2013-06-13 DIAGNOSIS — F329 Major depressive disorder, single episode, unspecified: Secondary | ICD-10-CM | POA: Insufficient documentation

## 2013-06-13 DIAGNOSIS — R059 Cough, unspecified: Secondary | ICD-10-CM | POA: Insufficient documentation

## 2013-06-13 DIAGNOSIS — R05 Cough: Secondary | ICD-10-CM | POA: Insufficient documentation

## 2013-06-13 DIAGNOSIS — N390 Urinary tract infection, site not specified: Secondary | ICD-10-CM

## 2013-06-13 DIAGNOSIS — J45909 Unspecified asthma, uncomplicated: Secondary | ICD-10-CM | POA: Insufficient documentation

## 2013-06-13 DIAGNOSIS — Z90710 Acquired absence of both cervix and uterus: Secondary | ICD-10-CM | POA: Insufficient documentation

## 2013-06-13 DIAGNOSIS — Z79899 Other long term (current) drug therapy: Secondary | ICD-10-CM | POA: Insufficient documentation

## 2013-06-13 DIAGNOSIS — R109 Unspecified abdominal pain: Secondary | ICD-10-CM

## 2013-06-13 DIAGNOSIS — E785 Hyperlipidemia, unspecified: Secondary | ICD-10-CM | POA: Insufficient documentation

## 2013-06-13 DIAGNOSIS — Z791 Long term (current) use of non-steroidal anti-inflammatories (NSAID): Secondary | ICD-10-CM | POA: Insufficient documentation

## 2013-06-13 DIAGNOSIS — F411 Generalized anxiety disorder: Secondary | ICD-10-CM | POA: Insufficient documentation

## 2013-06-13 DIAGNOSIS — F3289 Other specified depressive episodes: Secondary | ICD-10-CM | POA: Insufficient documentation

## 2013-06-13 LAB — HEPATIC FUNCTION PANEL
ALT: 39 U/L — ABNORMAL HIGH (ref 0–35)
AST: 42 U/L — ABNORMAL HIGH (ref 0–37)
Albumin: 4.1 g/dL (ref 3.5–5.2)
Alkaline Phosphatase: 85 U/L (ref 39–117)
Total Protein: 7.4 g/dL (ref 6.0–8.3)

## 2013-06-13 LAB — BASIC METABOLIC PANEL
BUN: 13 mg/dL (ref 6–23)
CO2: 25 mEq/L (ref 19–32)
Chloride: 102 mEq/L (ref 96–112)
Creatinine, Ser: 0.67 mg/dL (ref 0.50–1.10)
Glucose, Bld: 123 mg/dL — ABNORMAL HIGH (ref 70–99)
Potassium: 3.6 mEq/L (ref 3.5–5.1)

## 2013-06-13 LAB — URINALYSIS, ROUTINE W REFLEX MICROSCOPIC
Bilirubin Urine: NEGATIVE
Glucose, UA: NEGATIVE mg/dL
Hgb urine dipstick: NEGATIVE
Leukocytes, UA: NEGATIVE
Nitrite: POSITIVE — AB
Protein, ur: NEGATIVE mg/dL
Specific Gravity, Urine: >1.03 — ABNORMAL HIGH (ref 1.005–1.030)
pH: 6 (ref 5.0–8.0)

## 2013-06-13 LAB — CBC WITH DIFFERENTIAL/PLATELET
HCT: 38.6 % (ref 36.0–46.0)
Hemoglobin: 13.3 g/dL (ref 12.0–15.0)
Lymphocytes Relative: 31 % (ref 12–46)
Lymphs Abs: 3.1 10*3/uL (ref 0.7–4.0)
MCHC: 34.5 g/dL (ref 30.0–36.0)
Monocytes Absolute: 0.8 10*3/uL (ref 0.1–1.0)
Monocytes Relative: 7 % (ref 3–12)
Neutro Abs: 6 10*3/uL (ref 1.7–7.7)
Neutrophils Relative %: 58 % (ref 43–77)
RBC: 4.34 MIL/uL (ref 3.87–5.11)
WBC: 10.2 10*3/uL (ref 4.0–10.5)

## 2013-06-13 LAB — URINE MICROSCOPIC-ADD ON

## 2013-06-13 LAB — LIPASE, BLOOD: Lipase: 35 U/L (ref 11–59)

## 2013-06-13 MED ORDER — HYDROCODONE-ACETAMINOPHEN 5-325 MG PO TABS
1.0000 | ORAL_TABLET | Freq: Once | ORAL | Status: AC
  Administered 2013-06-13: 1 via ORAL
  Filled 2013-06-13: qty 1

## 2013-06-13 MED ORDER — HYDROMORPHONE HCL PF 1 MG/ML IJ SOLN
1.0000 mg | Freq: Once | INTRAMUSCULAR | Status: AC
  Administered 2013-06-13: 1 mg via INTRAVENOUS
  Filled 2013-06-13: qty 1

## 2013-06-13 MED ORDER — IOHEXOL 300 MG/ML  SOLN
100.0000 mL | Freq: Once | INTRAMUSCULAR | Status: AC | PRN
  Administered 2013-06-13: 100 mL via INTRAVENOUS

## 2013-06-13 MED ORDER — IOHEXOL 300 MG/ML  SOLN
50.0000 mL | Freq: Once | INTRAMUSCULAR | Status: AC | PRN
  Administered 2013-06-13: 50 mL via ORAL

## 2013-06-13 MED ORDER — CEPHALEXIN 500 MG PO CAPS
1000.0000 mg | ORAL_CAPSULE | Freq: Once | ORAL | Status: AC
  Administered 2013-06-13: 1000 mg via ORAL
  Filled 2013-06-13: qty 2

## 2013-06-13 MED ORDER — PANTOPRAZOLE SODIUM 40 MG IV SOLR
40.0000 mg | Freq: Once | INTRAVENOUS | Status: AC
  Administered 2013-06-13: 40 mg via INTRAVENOUS
  Filled 2013-06-13: qty 40

## 2013-06-13 MED ORDER — HYDROCODONE-ACETAMINOPHEN 5-325 MG PO TABS: 1.0000 | ORAL_TABLET | Freq: Four times a day (QID) | ORAL | Status: DC | PRN

## 2013-06-13 MED ORDER — CEPHALEXIN 500 MG PO CAPS: 500.0000 mg | ORAL_CAPSULE | Freq: Four times a day (QID) | ORAL | Status: DC

## 2013-06-13 MED ORDER — ONDANSETRON HCL 4 MG/2ML IJ SOLN
4.0000 mg | Freq: Once | INTRAMUSCULAR | Status: AC
  Administered 2013-06-13: 4 mg via INTRAVENOUS
  Filled 2013-06-13: qty 2

## 2013-06-13 NOTE — ED Notes (Signed)
abd pain, nausea, onset 3 am. Had colonoscopy yesterday.

## 2013-06-13 NOTE — ED Notes (Signed)
Pt reports upper abd pain that began 0300 this a.m. - pt states he had a colonoscopy yesterday - the pain awoke pt from her sleep and is described as "stabbing" - pt denies any fever or vomiting. GI doctor advised pt to be evaluated in ED.

## 2013-06-13 NOTE — ED Notes (Signed)
Pt provided w/ sprite per pt request - Dr. Estell Harpin, EDP aware

## 2013-06-13 NOTE — ED Provider Notes (Signed)
CSN: [id number]     Arrival date & time 06/13/13  1806 History  This chart was scribed for Benny Lennert, MD by Bennett Scrape, ED Scribe. This patient was seen in room [facility] and the patient's care was started at 7:56 PM.   Chief Complaint  Patient presents with  . Abdominal Pain    Patient is a 52 y.o. female presenting with abdominal pain. The history is provided by the patient. No language interpreter was used.  Abdominal Pain Pain location:  Epigastric, LUQ and RUQ Pain radiates to:  Back Pain severity:  Moderate Duration:  17 hours Timing:  Constant Chronicity:  New Associated symptoms: chills, cough (NP), fever and nausea   Associated symptoms: no chest pain, no diarrhea, no fatigue, no hematuria and no vomiting     HPI Comments: [name] is a 52 y.o. female who presents to the Emergency Department complaining of upper abdominal pain that radiates to the back pain that woke her from sleep around 3 AM this morning. She also complains of associated fever and chills that began a couple days prior to her visit. She reports that she had a colonoscopy yesterday without a problem. She denies any prior episodes of the same. She denies any prior abdominal surgery related other than a complete hysterectomy. She has a h/o bladder repair but denies any bladder problems currently.    Past Medical History  Diagnosis Date  . Asthma   . Hyperlipemia   . Depression   . Anxiety    Past Surgical History  Procedure Laterality Date  . Back surgery      pins  . Abdominal hysterectomy    . Bladder repair    . Back surgery  2010  . Cesarean section  1982, 1984   Family History  Problem Relation Age of Onset  . Colon cancer Other    History  Substance Use Topics  . Smoking status: Current Every Day Smoker -- 1.50 packs/day for 20 years    Types: Cigarettes  . Smokeless tobacco: Not on file  . Alcohol Use: No   No OB history provided.  Review of Systems   Constitutional: Positive for fever and chills. Negative for appetite change and fatigue.  HENT: Negative for congestion, ear discharge and sinus pressure.   Eyes: Negative for discharge.  Respiratory: Positive for cough (NP).   Cardiovascular: Negative for chest pain.  Gastrointestinal: Positive for nausea and abdominal pain. Negative for vomiting and diarrhea.  Genitourinary: Negative for frequency and hematuria.  Musculoskeletal: Negative for back pain.  Skin: Negative for rash.  Neurological: Negative for seizures and headaches.  Psychiatric/Behavioral: Negative for hallucinations.    Allergies  Review of patient's allergies indicates no known allergies.  Home Medications   Current Outpatient Rx  Name  Route  Sig  Dispense  Refill  . EXPIRED: DULoxetine (CYMBALTA) 30 MG capsule   Oral   Take 30 mg by mouth daily. Takes along with 60 mg tab to equal 90 mg.         . DULoxetine (CYMBALTA) 60 MG capsule   Oral   Take 90 mg by mouth daily. Takes along with 30 mg to equal 90 mg         . estradiol (ESTRACE) 2 MG tablet      TAKE ONE TABLET BY MOUTH ONCE DAILY.   30 tablet   11   . HYDROcodone-acetaminophen (NORCO/VICODIN) 5-325 MG per tablet   Oral   Take 1 tablet by  mouth every 4 (four) hours as needed for pain.   15 tablet   0   . EXPIRED: ipratropium (ATROVENT) 0.02 % nebulizer solution   Nebulization   Take 2.5 mLs (0.5 mg total) by nebulization every 4 (four) hours as needed.   75 mL   2   . naproxen (NAPROSYN) 500 MG tablet   Oral   Take 500 mg by mouth 2 (two) times daily with a meal.         . pravastatin (PRAVACHOL) 40 MG tablet   Oral   Take 40 mg by mouth daily.         . traMADol (ULTRAM) 50 MG tablet   Oral   Take 50 mg by mouth at bedtime as needed for moderate pain.          Triage Vitals: BP 126/70  Pulse 75  Temp(Src) 98.1 F (36.7 C) (Oral)  Resp 18  Ht 5\' 4"  (1.626 m)  Wt 220 lb (99.791 kg)  BMI 37.74 kg/m2  SpO2  97%  Physical Exam  Nursing note and vitals reviewed. Constitutional: She is oriented to person, place, and time. She appears well-developed and well-nourished.  HENT:  Head: Normocephalic and atraumatic.  Eyes: Conjunctivae and EOM are normal. No scleral icterus.  Neck: Neck supple. No thyromegaly present.  Cardiovascular: Normal rate and regular rhythm.  Exam reveals no gallop and no friction rub.   No murmur heard. Pulmonary/Chest: Effort normal and breath sounds normal. No stridor. She has no wheezes. She has no rales. She exhibits no tenderness.  Abdominal: She exhibits no distension. There is tenderness (Moderate Epigastric, LUQ & RUQ). There is no rebound.  Musculoskeletal: Normal range of motion. She exhibits no edema.  Lymphadenopathy:    She has no cervical adenopathy.  Neurological: She is alert and oriented to person, place, and time. She exhibits normal muscle tone. Coordination normal.  Skin: Skin is warm and dry. No rash noted. No erythema.  Psychiatric: She has a normal mood and affect. Her behavior is normal.    ED Course  Procedures (including critical care time)  Medications  iohexol (OMNIPAQUE) 300 MG/ML solution 100 mL (not administered)  HYDROmorphone (DILAUDID) injection 1 mg (1 mg Intravenous Given 06/13/13 2022)  ondansetron (ZOFRAN) injection 4 mg (4 mg Intravenous Given 06/13/13 2020)  pantoprazole (PROTONIX) injection 40 mg (40 mg Intravenous Given 06/13/13 2023)  iohexol (OMNIPAQUE) 300 MG/ML solution 50 mL (50 mLs Oral Contrast Given 06/13/13 2011)    DIAGNOSTIC STUDIES: Oxygen Saturation is 97% on room air, adequate by my interpretation.    COORDINATION OF CARE: 7:55 PM: Discussed treatment plan which includes CXR, CT of abdomen, CBC panel, BMP and UA with pt at bedside and pt agreed to plan.   9:56 PM: chest x ray normal, cat scan normal  Labs Review Labs Reviewed  BASIC METABOLIC PANEL - Abnormal; Notable for the following:    Glucose, Bld 123  (*)    All other components within normal limits  HEPATIC FUNCTION PANEL - Abnormal; Notable for the following:    AST 42 (*)    ALT 39 (*)    All other components within normal limits  URINALYSIS, ROUTINE W REFLEX MICROSCOPIC - Abnormal; Notable for the following:    Specific Gravity, Urine >1.030 (*)    Nitrite POSITIVE (*)    All other components within normal limits  URINE MICROSCOPIC-ADD ON - Abnormal; Notable for the following:    Squamous Epithelial / LPF FEW (*)  Bacteria, UA MANY (*)    All other components within normal limits  CBC WITH DIFFERENTIAL  LIPASE, BLOOD   Imaging Review Dg Chest 2 View  06/13/2013   CLINICAL DATA:  Abdomen pain  EXAM: CHEST  2 VIEW  COMPARISON:  September 14, 2011  FINDINGS: The heart size and mediastinal contours are within normal limits. There is no focal infiltrate, pulmonary edema, or pleural effusion. The visualized skeletal structures are stable. There is scoliosis of spine.  IMPRESSION: No active cardiopulmonary disease.   Electronically Signed   By: Sherian Rein M.D.   On: 06/13/2013 20:57   Ct Abdomen Pelvis W Contrast  06/13/2013   CLINICAL DATA:  Severe abdominal pain since colonoscopy 1 day ago  EXAM: CT ABDOMEN AND PELVIS WITH CONTRAST  TECHNIQUE: Multidetector CT imaging of the abdomen and pelvis was performed using the standard protocol following bolus administration of intravenous contrast. Sagittal and coronal MPR images reconstructed from axial data set.  CONTRAST:  50mL OMNIPAQUE IOHEXOL 300 MG/ML SOLN orally, OMNIPAQUE IOHEXOL 300 MG/ML SOLN IV  FINDINGS: Linear scarring at both lung bases.  5 mm right lower lobe nodule image 3 unchanged.  Tiny left lower lobe nodule image 3 not definitely seen on previous exam, question tiny calcified granuloma.  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  Minimal fatty infiltration of liver.  Liver, spleen, pancreas, kidneys, and adrenal glands normal appearance.   Minimally enlarged peripancreatic lymph node 13 mm short axis image 24 unchanged.  Normal appendix.  Post hysterectomy with nonvisualization of ovaries.  Stomach and small bowel loops unremarkable.  Colon is decompressed without wall thickening or pericolic infiltrative changes.  No free intraperitoneal air or fluid.  No mass, additional adenopathy, hernia, or acute osseous findings.  Prior L4-L5 fusion.  IMPRESSION: No acute intra-abdominal or intrapelvic abnormalities.  Stable minimally enlarged peripancreatic lymph node.  Stable 5 mm right lower lobe nodule with question tiny calcified granuloma in left lower lobe.  COMPARISON:: COMPARISON: 07/29/2010   Electronically Signed   By: Ulyses Southward M.D.   On: 06/13/2013 21:33    EKG Interpretation   None     called dr. Gerilyn Nestle to discuss results.  Was unable to speak with him  MDM  Uti, abd pain.  Follow up with pcp  Benny Lennert, MD 06/13/13 2231

## 2013-06-13 NOTE — Telephone Encounter (Signed)
Patient called stating she was having upper abdominal pain radiating to her mid back. Patient had colonoscopy yesterday without biopsy. She states she had no problem last evening and woke up this morning with pain. She complains of nausea but denies vomiting fever or chills. She is on a medication for back did not help her abdominal pain. Patient advised to come to the emergency room for further evaluation. ER physician will be informed.

## 2013-06-14 ENCOUNTER — Encounter (HOSPITAL_COMMUNITY): Payer: Self-pay | Admitting: Internal Medicine

## 2013-06-14 NOTE — Progress Notes (Signed)
Patient stated when to ed yesterday with stomach pain.but was feeling better today. Instructed patient that she may have needed to expel some air and that the colonoscopy may have been difficult which could cause some after pain.

## 2013-06-17 LAB — URINE CULTURE: Colony Count: >=100000

## 2013-07-15 ENCOUNTER — Ambulatory Visit (HOSPITAL_COMMUNITY)
Admission: RE | Admit: 2013-07-15 | Discharge: 2013-07-15 | Disposition: A | Discharge status: Home / Self Care | Payer: Medicare Other | Source: Ambulatory Visit | Attending: Family Medicine | Admitting: Family Medicine

## 2013-07-15 ENCOUNTER — Other Ambulatory Visit (HOSPITAL_COMMUNITY): Payer: Self-pay | Admitting: Family Medicine

## 2013-07-15 DIAGNOSIS — M1990 Unspecified osteoarthritis, unspecified site: Secondary | ICD-10-CM

## 2013-07-15 DIAGNOSIS — M25569 Pain in unspecified knee: Secondary | ICD-10-CM | POA: Insufficient documentation

## 2013-07-25 ENCOUNTER — Other Ambulatory Visit (HOSPITAL_COMMUNITY): Payer: Self-pay | Admitting: Family Medicine

## 2013-07-25 DIAGNOSIS — Z139 Encounter for screening, unspecified: Secondary | ICD-10-CM

## 2013-08-12 ENCOUNTER — Ambulatory Visit (HOSPITAL_COMMUNITY)
Admission: RE | Admit: 2013-08-12 | Discharge: 2013-08-12 | Disposition: A | Discharge status: Home / Self Care | Payer: Medicare Other | Source: Ambulatory Visit | Attending: Family Medicine | Admitting: Family Medicine

## 2013-08-12 DIAGNOSIS — Z139 Encounter for screening, unspecified: Secondary | ICD-10-CM

## 2013-08-12 DIAGNOSIS — Z1231 Encounter for screening mammogram for malignant neoplasm of breast: Secondary | ICD-10-CM | POA: Insufficient documentation

## 2013-09-12 ENCOUNTER — Ambulatory Visit (INDEPENDENT_AMBULATORY_CARE_PROVIDER_SITE_OTHER): Payer: Medicare Other | Admitting: Obstetrics & Gynecology

## 2013-09-12 ENCOUNTER — Encounter: Payer: Self-pay | Admitting: Obstetrics & Gynecology

## 2013-09-12 VITALS — BP 118/82 | Ht 63.0 in | Wt 232.5 lb

## 2013-09-12 DIAGNOSIS — Z1212 Encounter for screening for malignant neoplasm of rectum: Secondary | ICD-10-CM

## 2013-09-12 DIAGNOSIS — Z01419 Encounter for gynecological examination (general) (routine) without abnormal findings: Secondary | ICD-10-CM

## 2013-09-12 LAB — HEMOCCULT GUIAC POC 1CARD (OFFICE): Fecal Occult Blood, POC: NEGATIVE

## 2013-09-12 MED ORDER — ESTRADIOL 2 MG PO TABS: 2.0000 mg | ORAL_TABLET | Freq: Every day | ORAL | Status: DC

## 2013-09-12 NOTE — Progress Notes (Signed)
Patient ID: [name], female   DOB: 1960/12/05, 53 y.o.   MRN: [id number] Subjective:     [name] is a 53 y.o. female here for a routine exam.  No LMP recorded. Patient has had a hysterectomy. No obstetric history on file. Birth Control Method:  na Menstrual Calendar(currently): na  Current complaints: none.   Current acute medical issues:  none   Recent Gynecologic History No LMP recorded. Patient has had a hysterectomy. Last Pap: na,   Last mammogram: 2015,  normal  Past Medical History  Diagnosis Date  . Asthma   . Hyperlipemia   . Depression   . Anxiety     Past Surgical History  Procedure Laterality Date  . Back surgery      pins  . Abdominal hysterectomy    . Bladder repair    . Back surgery  2010  . Cesarean section  1982, 1984  . Colonoscopy N/A 06/12/2013    Procedure: COLONOSCOPY;  Surgeon: Rogene Houston, MD;  Location: [facility];  Service: Endoscopy;  Laterality: N/A;  930-moved to 1020 Ann to notify pt    OB History   Grav Para Term Preterm Abortions TAB SAB Ect Mult Living                  History   Social History  . Marital Status: Divorced    Spouse Name: N/A    Number of Children: N/A  . Years of Education: N/A   Social History Main Topics  . Smoking status: Current Every Day Smoker -- 1.50 packs/day for 20 years    Types: Cigarettes  . Smokeless tobacco: Never Used  . Alcohol Use: No  . Drug Use: No  . Sexual Activity: Yes    Birth Control/ Protection: Surgical   Other Topics Concern  . None   Social History Narrative  . None    Family History  Problem Relation Age of Onset  . Colon cancer Other      Review of Systems  Review of Systems  Constitutional: Negative for fever, chills, weight loss, malaise/fatigue and diaphoresis.  HENT: Negative for hearing loss, ear pain, nosebleeds, congestion, sore throat, neck pain, tinnitus and ear discharge.   Eyes: Negative for blurred vision, double vision,  photophobia, pain, discharge and redness.  Respiratory: Negative for cough, hemoptysis, sputum production, shortness of breath, wheezing and stridor.   Cardiovascular: Negative for chest pain, palpitations, orthopnea, claudication, leg swelling and PND.  Gastrointestinal: negative for abdominal pain. Negative for heartburn, nausea, vomiting, diarrhea, constipation, blood in stool and melena.  Genitourinary: Negative for dysuria, urgency, frequency, hematuria and flank pain.  Musculoskeletal: Negative for myalgias, back pain, joint pain and falls.  Skin: Negative for itching and rash.  Neurological: Negative for dizziness, tingling, tremors, sensory change, speech change, focal weakness, seizures, loss of consciousness, weakness and headaches.  Endo/Heme/Allergies: Negative for environmental allergies and polydipsia. Does not bruise/bleed easily.  Psychiatric/Behavioral: Negative for depression, suicidal ideas, hallucinations, memory loss and substance abuse. The patient is not nervous/anxious and does not have insomnia.        Objective:    Physical Exam  Vitals reviewed. Constitutional: She is oriented to person, place, and time. She appears well-developed and well-nourished.  HENT:  Head: Normocephalic and atraumatic.        Right Ear: External ear normal.  Left Ear: External ear normal.  Nose: Nose normal.  Mouth/Throat: Oropharynx is clear and moist.  Eyes: Conjunctivae and EOM are  normal. Pupils are equal, round, and reactive to light. Right eye exhibits no discharge. Left eye exhibits no discharge. No scleral icterus.  Neck: Normal range of motion. Neck supple. No tracheal deviation present. No thyromegaly present.  Cardiovascular: Normal rate, regular rhythm, normal heart sounds and intact distal pulses.  Exam reveals no gallop and no friction rub.   No murmur heard. Respiratory: Effort normal and breath sounds normal. No respiratory distress. She has no wheezes. She has no rales.  She exhibits no tenderness.  GI: Soft. Bowel sounds are normal. She exhibits no distension and no mass. There is no tenderness. There is no rebound and no guarding.  Genitourinary:  Breasts no masses skin changes or nipple changes bilaterally      Vulva is normal without lesions Vagina is pink moist without discharge Cervix absent Uterus is absent Adnexa is absent Rectal    hemoccult negative, normal tone, no masses  Musculoskeletal: Normal range of motion. She exhibits no edema and no tenderness.  Neurological: She is alert and oriented to person, place, and time. She has normal reflexes. She displays normal reflexes. No cranial nerve deficit. She exhibits normal muscle tone. Coordination normal.  Skin: Skin is warm and dry. No rash noted. No erythema. No pallor.  Psychiatric: She has a normal mood and affect. Her behavior is normal. Judgment and thought content normal.       Assessment:    Normal gyn exam  Plan:    Follow up in: 1 years.   Continue estradiol 2 mg

## 2014-01-28 ENCOUNTER — Emergency Department (HOSPITAL_COMMUNITY)
Admission: EM | Admit: 2014-01-28 | Discharge: 2014-01-28 | Disposition: A | Discharge status: Home / Self Care | Payer: Medicare Other | Attending: Emergency Medicine | Admitting: Emergency Medicine

## 2014-01-28 ENCOUNTER — Encounter (HOSPITAL_COMMUNITY): Payer: Self-pay | Admitting: Emergency Medicine

## 2014-01-28 DIAGNOSIS — F329 Major depressive disorder, single episode, unspecified: Secondary | ICD-10-CM | POA: Insufficient documentation

## 2014-01-28 DIAGNOSIS — Z79899 Other long term (current) drug therapy: Secondary | ICD-10-CM | POA: Insufficient documentation

## 2014-01-28 DIAGNOSIS — Z8541 Personal history of malignant neoplasm of cervix uteri: Secondary | ICD-10-CM | POA: Diagnosis not present

## 2014-01-28 DIAGNOSIS — N390 Urinary tract infection, site not specified: Secondary | ICD-10-CM | POA: Diagnosis not present

## 2014-01-28 DIAGNOSIS — J45909 Unspecified asthma, uncomplicated: Secondary | ICD-10-CM | POA: Diagnosis not present

## 2014-01-28 DIAGNOSIS — F3289 Other specified depressive episodes: Secondary | ICD-10-CM | POA: Diagnosis not present

## 2014-01-28 DIAGNOSIS — R109 Unspecified abdominal pain: Secondary | ICD-10-CM | POA: Diagnosis present

## 2014-01-28 DIAGNOSIS — E785 Hyperlipidemia, unspecified: Secondary | ICD-10-CM | POA: Insufficient documentation

## 2014-01-28 DIAGNOSIS — Z9889 Other specified postprocedural states: Secondary | ICD-10-CM | POA: Insufficient documentation

## 2014-01-28 DIAGNOSIS — Z791 Long term (current) use of non-steroidal anti-inflammatories (NSAID): Secondary | ICD-10-CM | POA: Diagnosis not present

## 2014-01-28 DIAGNOSIS — F411 Generalized anxiety disorder: Secondary | ICD-10-CM | POA: Insufficient documentation

## 2014-01-28 DIAGNOSIS — Z792 Long term (current) use of antibiotics: Secondary | ICD-10-CM | POA: Diagnosis not present

## 2014-01-28 DIAGNOSIS — F17200 Nicotine dependence, unspecified, uncomplicated: Secondary | ICD-10-CM | POA: Insufficient documentation

## 2014-01-28 DIAGNOSIS — M7989 Other specified soft tissue disorders: Secondary | ICD-10-CM | POA: Diagnosis not present

## 2014-01-28 DIAGNOSIS — Z90710 Acquired absence of both cervix and uterus: Secondary | ICD-10-CM | POA: Diagnosis not present

## 2014-01-28 LAB — COMPREHENSIVE METABOLIC PANEL
ALBUMIN: 3.9 g/dL (ref 3.5–5.2)
ALK PHOS: 83 U/L (ref 39–117)
ALT: 29 U/L (ref 0–35)
ANION GAP: 12 (ref 5–15)
AST: 31 U/L (ref 0–37)
BILIRUBIN TOTAL: 0.4 mg/dL (ref 0.3–1.2)
BUN: 8 mg/dL (ref 6–23)
CO2: 23 meq/L (ref 19–32)
CREATININE: 0.68 mg/dL (ref 0.50–1.10)
Calcium: 9.1 mg/dL (ref 8.4–10.5)
Chloride: 105 mEq/L (ref 96–112)
GFR calc Af Amer: >90 mL/min (ref >90)
GLUCOSE: 121 mg/dL — AB (ref 70–99)
Potassium: 3.6 mEq/L — ABNORMAL LOW (ref 3.7–5.3)
Sodium: 140 mEq/L (ref 137–147)
Total Protein: 7.1 g/dL (ref 6.0–8.3)

## 2014-01-28 LAB — URINALYSIS, ROUTINE W REFLEX MICROSCOPIC
Glucose, UA: NEGATIVE mg/dL
KETONES UR: NEGATIVE mg/dL
Nitrite: POSITIVE — AB
PH: 6 (ref 5.0–8.0)
Protein, ur: 30 mg/dL — AB
Specific Gravity, Urine: 1.022 (ref 1.005–1.030)
UROBILINOGEN UA: 0.2 mg/dL (ref 0.0–1.0)

## 2014-01-28 LAB — CBC WITH DIFFERENTIAL/PLATELET
BASOS PCT: 0 % (ref 0–1)
Basophils Absolute: 0 10*3/uL (ref 0.0–0.1)
Eosinophils Absolute: 0.2 10*3/uL (ref 0.0–0.7)
Eosinophils Relative: 2 % (ref 0–5)
HEMATOCRIT: 37.5 % (ref 36.0–46.0)
Hemoglobin: 12.7 g/dL (ref 12.0–15.0)
LYMPHS ABS: 1.9 10*3/uL (ref 0.7–4.0)
LYMPHS PCT: 22 % (ref 12–46)
MCH: 30.5 pg (ref 26.0–34.0)
MCHC: 33.9 g/dL (ref 30.0–36.0)
MCV: 89.9 fL (ref 78.0–100.0)
MONO ABS: 0.7 10*3/uL (ref 0.1–1.0)
MONOS PCT: 8 % (ref 3–12)
NEUTROS PCT: 68 % (ref 43–77)
Neutro Abs: 6 10*3/uL (ref 1.7–7.7)
Platelets: 248 10*3/uL (ref 150–400)
RBC: 4.17 MIL/uL (ref 3.87–5.11)
RDW: 13.8 % (ref 11.5–15.5)
WBC: 8.8 10*3/uL (ref 4.0–10.5)

## 2014-01-28 LAB — URINE MICROSCOPIC-ADD ON

## 2014-01-28 LAB — LIPASE, BLOOD: LIPASE: 18 U/L (ref 11–59)

## 2014-01-28 MED ORDER — HYDROCODONE-ACETAMINOPHEN 5-325 MG PO TABS: 1.0000 | ORAL_TABLET | Freq: Four times a day (QID) | ORAL | Status: DC | PRN

## 2014-01-28 MED ORDER — CIPROFLOXACIN HCL 500 MG PO TABS: 500.0000 mg | ORAL_TABLET | Freq: Two times a day (BID) | ORAL | Status: DC

## 2014-01-28 MED ORDER — OXYCODONE-ACETAMINOPHEN 5-325 MG PO TABS
1.0000 | ORAL_TABLET | Freq: Once | ORAL | Status: AC
  Administered 2014-01-28: 1 via ORAL
  Filled 2014-01-28: qty 1

## 2014-01-28 MED ORDER — CIPROFLOXACIN HCL 500 MG PO TABS
500.0000 mg | ORAL_TABLET | Freq: Once | ORAL | Status: AC
  Administered 2014-01-28: 500 mg via ORAL
  Filled 2014-01-28: qty 1

## 2014-01-28 NOTE — ED Notes (Signed)
Reports having pain and difficulty urinating for over the past week. Having lower abd pain and today noticing blood when she went to restroom but was unsure if it was vaginal or when she urinated.

## 2014-01-28 NOTE — ED Provider Notes (Signed)
CSN: [id number]     Arrival date & time 01/28/14  1401 History   First MD Initiated Contact with Patient 01/28/14 1815     Chief Complaint  Patient presents with  . Abdominal Pain     (Consider location/radiation/quality/duration/timing/severity/associated sxs/prior Treatment) The history is provided by the patient.  [name] is a 53 y.o. female hx of asthma, HL, cervical cancer s/p hysterectomy here with dysuria and lower abdominal pain. Dysuria for the last week. Also lower abdominal pain and cramps. She noticed blood in her urine today. Denies flank pain or fever or vomiting. Had hysterectomy for cervical cancer and is currently in remission. Has some intermittent leg swelling but no shortness of breath.    Past Medical History  Diagnosis Date  . Asthma   . Hyperlipemia   . Depression   . Anxiety    Past Surgical History  Procedure Laterality Date  . Back surgery      pins  . Abdominal hysterectomy    . Bladder repair    . Back surgery  2010  . Cesarean section  1982, 1984  . Colonoscopy N/A 06/12/2013    Procedure: COLONOSCOPY;  Surgeon: Rogene Houston, MD;  Location: [facility];  Service: Endoscopy;  Laterality: N/A;  930-moved to 1020 Ann to notify pt   Family History  Problem Relation Age of Onset  . Colon cancer Other    History  Substance Use Topics  . Smoking status: Current Every Day Smoker -- 1.50 packs/day for 20 years    Types: Cigarettes  . Smokeless tobacco: Never Used  . Alcohol Use: No   OB History   Grav Para Term Preterm Abortions TAB SAB Ect Mult Living                 Review of Systems  Cardiovascular: Positive for leg swelling.  Gastrointestinal: Positive for abdominal pain.  Genitourinary: Positive for dysuria.  All other systems reviewed and are negative.     Allergies  Review of patient's allergies indicates no known allergies.  Home Medications   Prior to Admission medications   Medication Sig Start Date End Date  Taking? Authorizing Provider  amphetamine-dextroamphetamine (ADDERALL) 20 MG tablet Take 10 mg by mouth 2 (two) times daily. 05/30/13  Yes Historical Provider, MD  DULoxetine (CYMBALTA) 30 MG capsule Take 30 mg by mouth daily.   Yes Historical Provider, MD  DULoxetine (CYMBALTA) 60 MG capsule Take 60 mg by mouth daily. Patient states she also takes a 30mg  tablet.Dewaine Conger with the 60mg  per patient   Yes Historical Provider, MD  estradiol (ESTRACE) 2 MG tablet Take 1 tablet (2 mg total) by mouth at bedtime. 09/12/13  Yes Florian Buff, MD  HYDROcodone-acetaminophen (NORCO/VICODIN) 5-325 MG per tablet Take 1 tablet by mouth every 4 (four) hours as needed for pain. 09/26/12  Yes Lenox Ahr, PA-C  HYDROcodone-acetaminophen (NORCO/VICODIN) 5-325 MG per tablet Take 1 tablet by mouth every 6 (six) hours as needed for moderate pain. 06/13/13  Yes Maudry Diego, MD  hydrOXYzine (ATARAX/VISTARIL) 10 MG tablet Take 10 mg by mouth 2 (two) times daily as needed. For itching/anxiety 05/01/13  Yes Historical Provider, MD  naproxen (NAPROSYN) 500 MG tablet Take 500 mg by mouth 2 (two) times daily.    Yes Historical Provider, MD  pantoprazole (PROTONIX) 40 MG tablet Take 40 mg by mouth daily as needed (for acid reflux).  05/30/13  Yes Historical Provider, MD  pravastatin (PRAVACHOL) 40 MG tablet  Take 40 mg by mouth at bedtime.    Yes Historical Provider, MD  traZODone (DESYREL) 100 MG tablet Take 150 mg by mouth at bedtime. 05/01/13  Yes Historical Provider, MD  cephALEXin (KEFLEX) 500 MG capsule Take 500 mg by mouth 4 (four) times daily. Completed course of medication two weeks ago from today. 01-28-14 06/13/13   Maudry Diego, MD  DULoxetine (CYMBALTA) 30 MG capsule Take 30 mg by mouth daily. Takes along with 60 mg tab to equal 90 mg. 09/16/11 06/13/13  Maricela Curet, MD  ipratropium (ATROVENT) 0.02 % nebulizer solution Take 2.5 mLs (0.5 mg total) by nebulization every 4 (four) hours as needed. 09/16/11 06/13/13   Maricela Curet, MD   BP 123/58  Pulse 78  Temp(Src) 98.3 F (36.8 C) (Oral)  Resp 17  SpO2 96% Physical Exam  Nursing note and vitals reviewed. Constitutional: She is oriented to person, place, and time. She appears well-developed and well-nourished.  HENT:  Head: Normocephalic.  Mouth/Throat: Oropharynx is clear and moist.  Eyes: Conjunctivae are normal. Pupils are equal, round, and reactive to light.  Neck: Normal range of motion.  Cardiovascular: Normal rate, regular rhythm and normal heart sounds.   Pulmonary/Chest: Effort normal and breath sounds normal. No respiratory distress. She has no wheezes. She has no rales.  Abdominal: Soft. Bowel sounds are normal. She exhibits no distension. There is no tenderness. There is no rebound and no guarding.  No CVAT   Musculoskeletal: Normal range of motion.  1+ edema bilateral legs. No calf tenderness. Good pulses   Neurological: She is alert and oriented to person, place, and time. No cranial nerve deficit. Coordination normal.  Skin: Skin is warm and dry.  Psychiatric: She has a normal mood and affect. Her behavior is normal. Judgment normal.    ED Course  Procedures (including critical care time) Labs Review Labs Reviewed  COMPREHENSIVE METABOLIC PANEL - Abnormal; Notable for the following:    Potassium 3.6 (*)    Glucose, Bld 121 (*)    All other components within normal limits  URINALYSIS, ROUTINE W REFLEX MICROSCOPIC - Abnormal; Notable for the following:    Color, Urine AMBER (*)    APPearance CLOUDY (*)    Hgb urine dipstick LARGE (*)    Bilirubin Urine SMALL (*)    Protein, ur 30 (*)    Nitrite POSITIVE (*)    Leukocytes, UA LARGE (*)    All other components within normal limits  URINE MICROSCOPIC-ADD ON - Abnormal; Notable for the following:    Squamous Epithelial / LPF FEW (*)    Bacteria, UA MANY (*)    All other components within normal limits  URINE CULTURE  CBC WITH DIFFERENTIAL  LIPASE, BLOOD     Imaging Review No results found.   EKG Interpretation None      MDM   Final diagnoses:  None   [name] is a 53 y.o. female here with dysuria, hematuria. Nontender abdomen and I doubt stone. Likely UTI. Will check labs and UA. Leg swelling for several months, likely dependent edema. I doubt DVT.   7:42 PM UA + UTI. Previous culture sensitive to cipro. Will give cipro again. Not septic, no signs of pyelo and not in retention. Will d/c home with cipro, pain meds.   Wandra Arthurs, MD 01/28/14 [phone]

## 2014-01-28 NOTE — Discharge Instructions (Signed)
Take cipro for a week.   Take motrin for pain.   Take vicodin for severe pain. Do NOT drive with it.   You may still have blood in urine for several days.   Follow up with your doctor.   Try elevate your legs, use compression stockings.   Return to ER if you have severe pain, fever, trouble urinating.

## 2014-01-31 LAB — URINE CULTURE: Colony Count: >=100000

## 2014-02-01 ENCOUNTER — Telehealth (HOSPITAL_BASED_OUTPATIENT_CLINIC_OR_DEPARTMENT_OTHER): Payer: Self-pay | Admitting: Emergency Medicine

## 2014-02-01 NOTE — Telephone Encounter (Signed)
Post ED Visit - Positive Culture Follow-up  Culture report reviewed by antimicrobial stewardship pharmacist: []  Wes Forsyth, Pharm.D., BCPS []  Heide Guile, Pharm.D., BCPS []  Alycia Rossetti, Pharm.D., BCPS []  Barry, Pharm.D., BCPS, AAHIVP [x]  Legrand Como, Pharm.D., BCPS, AAHIVP  Positive urine culture Treated with urine, organism sensitive to the same and no further patient follow-up is required at this time.  [place], Rex Kras 02/01/2014, 2:11 PM

## 2014-07-14 ENCOUNTER — Other Ambulatory Visit (HOSPITAL_COMMUNITY): Payer: Self-pay | Admitting: Family Medicine

## 2014-07-14 DIAGNOSIS — Z1231 Encounter for screening mammogram for malignant neoplasm of breast: Secondary | ICD-10-CM

## 2014-07-24 DIAGNOSIS — F32A Depression, unspecified: Secondary | ICD-10-CM | POA: Insufficient documentation

## 2014-08-14 ENCOUNTER — Ambulatory Visit (HOSPITAL_COMMUNITY)
Admission: RE | Admit: 2014-08-14 | Discharge: 2014-08-14 | Disposition: A | Discharge status: Home / Self Care | Payer: Medicare Other | Source: Ambulatory Visit | Attending: Family Medicine | Admitting: Family Medicine

## 2014-08-14 DIAGNOSIS — Z1231 Encounter for screening mammogram for malignant neoplasm of breast: Secondary | ICD-10-CM | POA: Diagnosis present

## 2014-08-18 ENCOUNTER — Other Ambulatory Visit: Payer: Self-pay | Admitting: Family Medicine

## 2014-08-18 DIAGNOSIS — R928 Other abnormal and inconclusive findings on diagnostic imaging of breast: Secondary | ICD-10-CM

## 2014-09-02 ENCOUNTER — Ambulatory Visit (HOSPITAL_COMMUNITY)
Admission: RE | Admit: 2014-09-02 | Discharge: 2014-09-02 | Disposition: A | Discharge status: Home / Self Care | Payer: Medicare Other | Source: Ambulatory Visit | Attending: Family Medicine | Admitting: Family Medicine

## 2014-09-02 ENCOUNTER — Encounter (HOSPITAL_COMMUNITY): Payer: Medicare Other

## 2014-09-02 DIAGNOSIS — R928 Other abnormal and inconclusive findings on diagnostic imaging of breast: Secondary | ICD-10-CM | POA: Insufficient documentation

## 2014-09-16 ENCOUNTER — Ambulatory Visit (INDEPENDENT_AMBULATORY_CARE_PROVIDER_SITE_OTHER): Payer: Medicare Other | Admitting: Obstetrics & Gynecology

## 2014-09-16 ENCOUNTER — Encounter: Payer: Self-pay | Admitting: Obstetrics & Gynecology

## 2014-09-16 VITALS — BP 112/60 | Ht 62.0 in | Wt 228.0 lb

## 2014-09-16 DIAGNOSIS — Z1212 Encounter for screening for malignant neoplasm of rectum: Secondary | ICD-10-CM | POA: Diagnosis not present

## 2014-09-16 DIAGNOSIS — Z01419 Encounter for gynecological examination (general) (routine) without abnormal findings: Secondary | ICD-10-CM | POA: Diagnosis not present

## 2014-09-16 DIAGNOSIS — Z1211 Encounter for screening for malignant neoplasm of colon: Secondary | ICD-10-CM

## 2014-09-16 MED ORDER — ESTRADIOL 2 MG PO TABS: 2.0000 mg | ORAL_TABLET | Freq: Every day | ORAL | Status: DC

## 2014-09-16 NOTE — Progress Notes (Signed)
Patient ID: [name], female   DOB: July 22, 1960, 54 y.o.   MRN: [id number] Subjective:     [name] is a 54 y.o. female here for a routine exam.  No LMP recorded. Patient has had a hysterectomy. No obstetric history on file. Birth Control Method:  None hysterectomy Menstrual Calendar(currently): na  Current complaints: none.   Current acute medical issues:  See below   Recent Gynecologic History No LMP recorded. Patient has had a hysterectomy. Last Pap: 10 years ago,   Last mammogram: 2016,  normal  Past Medical History  Diagnosis Date  . Asthma   . Hyperlipemia   . Depression   . Anxiety     Past Surgical History  Procedure Laterality Date  . Back surgery      pins  . Abdominal hysterectomy    . Bladder repair    . Back surgery  2010  . Cesarean section  1982, 1984  . Colonoscopy N/A 06/12/2013    Procedure: COLONOSCOPY;  Surgeon: Rogene Houston, MD;  Location: [facility];  Service: Endoscopy;  Laterality: N/A;  930-moved to 1020 Ann to notify pt    OB History    No data available      History   Social History  . Marital Status: Divorced    Spouse Name: N/A  . Number of Children: N/A  . Years of Education: N/A   Social History Main Topics  . Smoking status: Current Every Day Smoker -- 12.00 packs/day for 20 years    Types: Cigarettes  . Smokeless tobacco: Never Used  . Alcohol Use: No  . Drug Use: No  . Sexual Activity: Not Currently    Birth Control/ Protection: Surgical   Other Topics Concern  . None   Social History Narrative    Family History  Problem Relation Age of Onset  . Colon cancer Other      Current outpatient prescriptions:  .  amphetamine-dextroamphetamine (ADDERALL) 20 MG tablet, Take 10 mg by mouth 2 (two) times daily., Disp: , Rfl:  .  estradiol (ESTRACE) 2 MG tablet, Take 1 tablet (2 mg total) by mouth at bedtime., Disp: 30 tablet, Rfl: 11 .  FLUoxetine (PROZAC) 20 MG tablet, Take 20 mg by mouth daily.,  Disp: , Rfl:  .  HYDROcodone-acetaminophen (NORCO/VICODIN) 5-325 MG per tablet, Take 1 tablet by mouth every 4 (four) hours as needed for pain., Disp: 15 tablet, Rfl: 0 .  hydrOXYzine (ATARAX/VISTARIL) 10 MG tablet, Take 10 mg by mouth 2 (two) times daily as needed. For itching/anxiety, Disp: , Rfl:  .  naproxen (NAPROSYN) 500 MG tablet, Take 500 mg by mouth 2 (two) times daily. , Disp: , Rfl:  .  pantoprazole (PROTONIX) 40 MG tablet, Take 40 mg by mouth daily as needed (for acid reflux). , Disp: , Rfl:  .  pravastatin (PRAVACHOL) 40 MG tablet, Take 40 mg by mouth at bedtime. , Disp: , Rfl:  .  traZODone (DESYREL) 100 MG tablet, Take 150 mg by mouth at bedtime., Disp: , Rfl:  .  cephALEXin (KEFLEX) 500 MG capsule, Take 500 mg by mouth 4 (four) times daily. Completed course of medication two weeks ago from today. 01-28-14, Disp: , Rfl:  .  ciprofloxacin (CIPRO) 500 MG tablet, Take 1 tablet (500 mg total) by mouth 2 (two) times daily. One po bid x 7 days (Patient not taking: Reported on 09/16/2014), Disp: 14 tablet, Rfl: 0 .  DULoxetine (CYMBALTA) 30 MG capsule, Take 30  mg by mouth daily. Takes along with 60 mg tab to equal 90 mg., Disp: , Rfl:  .  DULoxetine (CYMBALTA) 30 MG capsule, Take 30 mg by mouth daily., Disp: , Rfl:  .  DULoxetine (CYMBALTA) 60 MG capsule, Take 60 mg by mouth daily. Patient states she also takes a 30mg  tablet.. Takes with the 60mg  per patient, Disp: , Rfl:  .  HYDROcodone-acetaminophen (NORCO/VICODIN) 5-325 MG per tablet, Take 1 tablet by mouth every 6 (six) hours as needed for moderate pain. (Patient not taking: Reported on 09/16/2014), Disp: 20 tablet, Rfl: 0 .  HYDROcodone-acetaminophen (NORCO/VICODIN) 5-325 MG per tablet, Take 1 tablet by mouth every 6 (six) hours as needed for severe pain. (Patient not taking: Reported on 09/16/2014), Disp: 15 tablet, Rfl: 0 .  ipratropium (ATROVENT) 0.02 % nebulizer solution, Take 2.5 mLs (0.5 mg total) by nebulization every 4 (four) hours as  needed., Disp: 75 mL, Rfl: 2  Review of Systems  Review of Systems  Constitutional: Negative for fever, chills, weight loss, malaise/fatigue and diaphoresis.  HENT: Negative for hearing loss, ear pain, nosebleeds, congestion, sore throat, neck pain, tinnitus and ear discharge.   Eyes: Negative for blurred vision, double vision, photophobia, pain, discharge and redness.  Respiratory: Negative for cough, hemoptysis, sputum production, shortness of breath, wheezing and stridor.   Cardiovascular: Negative for chest pain, palpitations, orthopnea, claudication, leg swelling and PND.  Gastrointestinal: negative for abdominal pain. Negative for heartburn, nausea, vomiting, diarrhea, constipation, blood in stool and melena.  Genitourinary: Negative for dysuria, urgency, frequency, hematuria and flank pain.  Musculoskeletal: Negative for myalgias, back pain, joint pain and falls.  Skin: Negative for itching and rash.  Neurological: Negative for dizziness, tingling, tremors, sensory change, speech change, focal weakness, seizures, loss of consciousness, weakness and headaches.  Endo/Heme/Allergies: Negative for environmental allergies and polydipsia. Does not bruise/bleed easily.  Psychiatric/Behavioral: Negative for depression, suicidal ideas, hallucinations, memory loss and substance abuse. The patient is not nervous/anxious and does not have insomnia.        Objective:  Blood pressure 112/60, height 5\' 2"  (1.575 m), weight 228 lb (103.42 kg).   Physical Exam  Vitals reviewed. Constitutional: She is oriented to person, place, and time. She appears well-developed and well-nourished.  HENT:  Head: Normocephalic and atraumatic.        Right Ear: External ear normal.  Left Ear: External ear normal.  Nose: Nose normal.  Mouth/Throat: Oropharynx is clear and moist.  Eyes: Conjunctivae and EOM are normal. Pupils are equal, round, and reactive to light. Right eye exhibits no discharge. Left eye  exhibits no discharge. No scleral icterus.  Neck: Normal range of motion. Neck supple. No tracheal deviation present. No thyromegaly present.  Cardiovascular: Normal rate, regular rhythm, normal heart sounds and intact distal pulses.  Exam reveals no gallop and no friction rub.   No murmur heard. Respiratory: Effort normal and breath sounds normal. No respiratory distress. She has no wheezes. She has no rales. She exhibits no tenderness.  GI: Soft. Bowel sounds are normal. She exhibits no distension and no mass. There is no tenderness. There is no rebound and no guarding.  Genitourinary:  Breasts no masses skin changes or nipple changes bilaterally      Vulva is normal without lesions Vagina is pink moist without discharge Cervix absent Uterus is absent Adnexa is absent Rectal    hemoccult negative, normal tone, no masses  Musculoskeletal: Normal range of motion. She exhibits no edema and no tenderness.  Neurological: She  is alert and oriented to person, place, and time. She has normal reflexes. She displays normal reflexes. No cranial nerve deficit. She exhibits normal muscle tone. Coordination normal.  Skin: Skin is warm and dry. No rash noted. No erythema. No pallor.  Psychiatric: She has a normal mood and affect. Her behavior is normal. Judgment and thought content normal.       Assessment:    Healthy female exam.    Plan:    Follow up in: 1 year. otherwise prn   Continue the estradiol 2mg  daily

## 2015-06-12 ENCOUNTER — Ambulatory Visit (HOSPITAL_COMMUNITY)
Admission: RE | Admit: 2015-06-12 | Discharge: 2015-06-12 | Disposition: A | Discharge status: Home / Self Care | Payer: Medicare Other | Source: Ambulatory Visit | Attending: Family Medicine | Admitting: Family Medicine

## 2015-06-12 ENCOUNTER — Other Ambulatory Visit (HOSPITAL_COMMUNITY): Payer: Self-pay | Admitting: Family Medicine

## 2015-06-12 DIAGNOSIS — M25562 Pain in left knee: Secondary | ICD-10-CM | POA: Diagnosis not present

## 2015-06-12 DIAGNOSIS — M150 Primary generalized (osteo)arthritis: Principal | ICD-10-CM

## 2015-06-12 DIAGNOSIS — M159 Polyosteoarthritis, unspecified: Secondary | ICD-10-CM

## 2015-06-12 DIAGNOSIS — M25462 Effusion, left knee: Secondary | ICD-10-CM | POA: Insufficient documentation

## 2015-08-11 ENCOUNTER — Other Ambulatory Visit (HOSPITAL_COMMUNITY): Payer: Self-pay | Admitting: Family Medicine

## 2015-08-11 DIAGNOSIS — Z1231 Encounter for screening mammogram for malignant neoplasm of breast: Secondary | ICD-10-CM

## 2015-09-04 ENCOUNTER — Ambulatory Visit (HOSPITAL_COMMUNITY): Payer: Medicare Other

## 2015-09-04 ENCOUNTER — Ambulatory Visit (HOSPITAL_COMMUNITY)
Admission: RE | Admit: 2015-09-04 | Discharge: 2015-09-04 | Disposition: A | Discharge status: Home / Self Care | Payer: Medicare Other | Source: Ambulatory Visit | Attending: Family Medicine | Admitting: Family Medicine

## 2015-09-04 DIAGNOSIS — Z1231 Encounter for screening mammogram for malignant neoplasm of breast: Secondary | ICD-10-CM | POA: Diagnosis present

## 2015-09-29 ENCOUNTER — Ambulatory Visit (INDEPENDENT_AMBULATORY_CARE_PROVIDER_SITE_OTHER): Payer: Medicare Other | Admitting: Obstetrics & Gynecology

## 2015-09-29 ENCOUNTER — Encounter: Payer: Self-pay | Admitting: Obstetrics & Gynecology

## 2015-09-29 VITALS — BP 120/60 | HR 60 | Ht 64.0 in | Wt 206.0 lb

## 2015-09-29 DIAGNOSIS — Z01419 Encounter for gynecological examination (general) (routine) without abnormal findings: Secondary | ICD-10-CM | POA: Diagnosis not present

## 2015-09-29 DIAGNOSIS — Z1211 Encounter for screening for malignant neoplasm of colon: Secondary | ICD-10-CM

## 2015-09-29 DIAGNOSIS — Z1212 Encounter for screening for malignant neoplasm of rectum: Secondary | ICD-10-CM

## 2015-09-29 MED ORDER — ESTRADIOL 2 MG PO TABS: 2.0000 mg | ORAL_TABLET | Freq: Every day | ORAL | Status: DC

## 2015-09-29 NOTE — Progress Notes (Signed)
Patient ID: [name], female   DOB: 12-12-1960, 55 y.o.   MRN: [id number] Subjective:     [name] is a 55 y.o. female here for a routine exam.  No LMP recorded. Patient has had a hysterectomy. No obstetric history on file. Birth Control Method:  hysterectomy Menstrual Calendar(currently): none  Current complaints: none.   Current acute medical issues:  Nothing acute   Recent Gynecologic History No LMP recorded. Patient has had a hysterectomy. Last Pap: 2003 normal Last mammogram: 2017,  normal  Past Medical History  Diagnosis Date  . Asthma   . Hyperlipemia   . Depression   . Anxiety     Past Surgical History  Procedure Laterality Date  . Back surgery      pins  . Abdominal hysterectomy    . Bladder repair    . Back surgery  2010  . Cesarean section  1982, 1984  . Colonoscopy N/A 06/12/2013    Procedure: COLONOSCOPY;  Surgeon: Rogene Houston, MD;  Location: [facility];  Service: Endoscopy;  Laterality: N/A;  930-moved to 1020 Ann to notify pt    OB History    No data available      Social History   Social History  . Marital Status: Divorced    Spouse Name: N/A  . Number of Children: N/A  . Years of Education: N/A   Social History Main Topics  . Smoking status: Current Every Day Smoker -- 12.00 packs/day for 20 years    Types: Cigarettes  . Smokeless tobacco: Never Used  . Alcohol Use: No  . Drug Use: No  . Sexual Activity: Not Currently    Birth Control/ Protection: Surgical   Other Topics Concern  . None   Social History Narrative    Family History  Problem Relation Age of Onset  . Colon cancer Other      Current outpatient prescriptions:  .  amphetamine-dextroamphetamine (ADDERALL) 20 MG tablet, Take 10 mg by mouth 2 (two) times daily., Disp: , Rfl:  .  cetirizine (ZYRTEC) 10 MG tablet, Take 10 mg by mouth daily., Disp: , Rfl:  .  estradiol (ESTRACE) 2 MG tablet, Take 1 tablet (2 mg total) by mouth at bedtime., Disp: 30  tablet, Rfl: 11 .  FLUoxetine (PROZAC) 20 MG tablet, Take 20 mg by mouth daily., Disp: , Rfl:  .  naproxen (NAPROSYN) 500 MG tablet, Take 500 mg by mouth 2 (two) times daily. , Disp: , Rfl:  .  oxycodone (OXY-IR) 5 MG capsule, Take 5 mg by mouth every 4 (four) hours as needed., Disp: , Rfl:  .  pantoprazole (PROTONIX) 40 MG tablet, Take 40 mg by mouth daily as needed (for acid reflux). , Disp: , Rfl:  .  pravastatin (PRAVACHOL) 40 MG tablet, Take 40 mg by mouth at bedtime. , Disp: , Rfl:  .  traZODone (DESYREL) 100 MG tablet, Take 150 mg by mouth at bedtime., Disp: , Rfl:  .  DULoxetine (CYMBALTA) 30 MG capsule, Take 30 mg by mouth daily. Takes along with 60 mg tab to equal 90 mg., Disp: , Rfl:  .  hydrOXYzine (ATARAX/VISTARIL) 10 MG tablet, Take 10 mg by mouth 2 (two) times daily as needed. Reported on 09/29/2015, Disp: , Rfl:  .  ipratropium (ATROVENT) 0.02 % nebulizer solution, Take 2.5 mLs (0.5 mg total) by nebulization every 4 (four) hours as needed., Disp: 75 mL, Rfl: 2  Review of Systems  Review of Systems  Constitutional:  Negative for fever, chills, weight loss, malaise/fatigue and diaphoresis.  HENT: Negative for hearing loss, ear pain, nosebleeds, congestion, sore throat, neck pain, tinnitus and ear discharge.   Eyes: Negative for blurred vision, double vision, photophobia, pain, discharge and redness.  Respiratory: Negative for cough, hemoptysis, sputum production, shortness of breath, wheezing and stridor.   Cardiovascular: Negative for chest pain, palpitations, orthopnea, claudication, leg swelling and PND.  Gastrointestinal: negative for abdominal pain. Negative for heartburn, nausea, vomiting, diarrhea, constipation, blood in stool and melena.  Genitourinary: Negative for dysuria, urgency, frequency, hematuria and flank pain.  Musculoskeletal: Negative for myalgias, back pain, joint pain and falls.  Skin: Negative for itching and rash.  Neurological: Negative for dizziness,  tingling, tremors, sensory change, speech change, focal weakness, seizures, loss of consciousness, weakness and headaches.  Endo/Heme/Allergies: Negative for environmental allergies and polydipsia. Does not bruise/bleed easily.  Psychiatric/Behavioral: Negative for depression, suicidal ideas, hallucinations, memory loss and substance abuse. The patient is not nervous/anxious and does not have insomnia.        Objective:  Blood pressure 120/60, pulse 60, height 5\' 4"  (1.626 m), weight 206 lb (93.441 kg).   Physical Exam  Vitals reviewed. Constitutional: She is oriented to person, place, and time. She appears well-developed and well-nourished.  HENT:  Head: Normocephalic and atraumatic.        Right Ear: External ear normal.  Left Ear: External ear normal.  Nose: Nose normal.  Mouth/Throat: Oropharynx is clear and moist.  Eyes: Conjunctivae and EOM are normal. Pupils are equal, round, and reactive to light. Right eye exhibits no discharge. Left eye exhibits no discharge. No scleral icterus.  Neck: Normal range of motion. Neck supple. No tracheal deviation present. No thyromegaly present.  Cardiovascular: Normal rate, regular rhythm, normal heart sounds and intact distal pulses.  Exam reveals no gallop and no friction rub.   No murmur heard. Respiratory: Effort normal and breath sounds normal. No respiratory distress. She has no wheezes. She has no rales. She exhibits no tenderness.  GI: Soft. Bowel sounds are normal. She exhibits no distension and no mass. There is no tenderness. There is no rebound and no guarding.  Genitourinary:  Breasts no masses skin changes or nipple changes bilaterally      Vulva is normal without lesions Vagina is pink moist without discharge Cervix absentUterus is absent Adnexa is negative  {Rectal    hemoccult negative, normal tone, no masses  Musculoskeletal: Normal range of motion. She exhibits no edema and no tenderness.  Neurological: She is alert and  oriented to person, place, and time. She has normal reflexes. She displays normal reflexes. No cranial nerve deficit. She exhibits normal muscle tone. Coordination normal.  Skin: Skin is warm and dry. No rash noted. No erythema. No pallor.  Psychiatric: She has a normal mood and affect. Her behavior is normal. Judgment and thought content normal.       Assessment:    Healthy female exam.    Plan:    Hormone replacement therapy: hormone replacement therapy: estradiol 2 mg. Follow up in: 1 year.    Meds ordered this encounter  Medications  . oxycodone (OXY-IR) 5 MG capsule    Sig: Take 5 mg by mouth every 4 (four) hours as needed.  . cetirizine (ZYRTEC) 10 MG tablet    Sig: Take 10 mg by mouth daily.  Marland Kitchen estradiol (ESTRACE) 2 MG tablet    Sig: Take 1 tablet (2 mg total) by mouth at bedtime.    Dispense:  30 tablet    Refill:  11    No orders of the defined types were placed in this encounter.

## 2015-11-20 LAB — BASIC METABOLIC PANEL: Glucose: 88 mg/dL

## 2016-08-19 ENCOUNTER — Other Ambulatory Visit (HOSPITAL_COMMUNITY): Payer: Self-pay | Admitting: Family Medicine

## 2016-08-19 DIAGNOSIS — Z1231 Encounter for screening mammogram for malignant neoplasm of breast: Secondary | ICD-10-CM

## 2016-09-05 ENCOUNTER — Ambulatory Visit (HOSPITAL_COMMUNITY)
Admission: RE | Admit: 2016-09-05 | Discharge: 2016-09-05 | Disposition: A | Discharge status: Home / Self Care | Payer: Medicare Other | Source: Ambulatory Visit | Attending: Family Medicine | Admitting: Family Medicine

## 2016-09-05 DIAGNOSIS — Z1231 Encounter for screening mammogram for malignant neoplasm of breast: Secondary | ICD-10-CM | POA: Diagnosis present

## 2016-09-30 ENCOUNTER — Other Ambulatory Visit: Payer: Medicare Other | Admitting: Obstetrics & Gynecology

## 2016-10-10 ENCOUNTER — Encounter: Payer: Self-pay | Admitting: Physical Therapy

## 2016-10-10 ENCOUNTER — Ambulatory Visit: Payer: Medicare Other | Attending: Orthopedic Surgery | Admitting: Physical Therapy

## 2016-10-10 DIAGNOSIS — M25611 Stiffness of right shoulder, not elsewhere classified: Secondary | ICD-10-CM

## 2016-10-10 DIAGNOSIS — M25511 Pain in right shoulder: Secondary | ICD-10-CM | POA: Diagnosis present

## 2016-10-10 DIAGNOSIS — R600 Localized edema: Secondary | ICD-10-CM

## 2016-10-10 NOTE — Patient Instructions (Addendum)
ROM: Pendulum (Circular)  Let right arm move in circle clockwise, then counterclockwise, by rocking body weight in circular pattern. Circle _10___ times each direction per set. Do _3___ sessions per day.  Pendulum Side to Side  Bend forward 90 at waist, leaning on table for support. Rock body from side to side and let arm swing freely. Repeat _10___ times. Do __3__ sessions per day.  Finger Flexors  Keeping right fingertips straight, squeeze a wash cloth or stress ball.  Repeat __20__ times. Do __3__ sessions per day.   AROM: Elbow Flexion / Extension  With left hand palm up, gently bend elbow as far as possible. Then straighten arm as far as possible. Repeat __10__ times per set. Do __3__ sessions per day.  SHOULDER: Flexion On YOUR LAP  Place hands on YOUR THIGHS, elbows straight. Slide your palms toward your knees and bring back to starting point._10__ reps per set, __3_ sets per day.  Posture - Sitting   Sit upright, head facing forward. Try using a roll to support lower back. Keep shoulders relaxed, and avoid rounded back. Keep hips level with knees. Avoid crossing legs for long periods.  Copyright  VHI. All rights reserved.   Cryotherapy  ICE 20 mins at most, 3 times a day following exercises.      HOME CARE INSTRUCTIONS Follow these tips to use ice and cold packs safely.  Place a dry or damp towel between the ice and skin. A damp towel will cool the skin more quickly, so you may need to shorten the time that the ice is used.  For a more rapid response, add gentle compression to the ice.  Ice for no more than 20 minutes at a time. The bonier the area you are icing, the less time it will take to get the benefits of ice.  Check your skin after 5 minutes to make sure there are no signs of a poor response to cold or skin damage.  Rest 20 minutes or more between uses.  Once your skin is numb, you can end your treatment. You can test numbness by very lightly  touching your skin. The touch should be so light that you do not see the skin dimple from the pressure of your fingertip. When using ice, most people will feel these normal sensations in this order: cold, burning, aching, and numbness.  Do not use ice on someone who cannot communicate their responses to pain, such as small children or people with dementia.   HOW TO MAKE AN ICE PACK Ice packs are the most common way to use ice therapy. Other methods include ice massage, ice baths, and cryosprays. Muscle creams that cause a cold, tingly feeling do not offer the same benefits that ice offers and should not be used as a substitute unless recommended by your caregiver. To make an ice pack, do one of the following:  Place crushed ice or a bag of frozen vegetables in a sealable plastic bag. Squeeze out the excess air. Place this bag inside another plastic bag. Slide the bag into a pillowcase or place a damp towel between your skin and the bag.  Mix 3 parts water with 1 part rubbing alcohol. Freeze the mixture in a sealable plastic bag. When you remove the mixture from the freezer, it will be slushy. Squeeze out the excess air. Place this bag inside another plastic bag. Slide the bag into a pillowcase or place a damp towel between your skin and the bag.  Madelyn Flavors, PT 10/10/16 1:27 PM; [facility] [place], [place], [address] Phone: [phone]   Fax:  [phone]

## 2016-10-10 NOTE — Therapy (Signed)
[facility] [place], [place], [address] Phone: [phone]   Fax:  [phone]  Physical Therapy Evaluation  Patient Details  Name: [name] MRN: [id number] Date of Birth: 02/01/1961 Referring Provider: Remo Lipps Case MD  Encounter Date: 10/10/2016      PT End of Session - 10/10/16 1307    Visit Number 1   Number of Visits 16   Date for PT Re-Evaluation 12/05/16   PT Start Time 1307   PT Stop Time 1355   PT Time Calculation (min) 48 min   Activity Tolerance Patient tolerated treatment well   Behavior During Therapy [facility] for tasks assessed/performed      Past Medical History:  Diagnosis Date  . Anxiety   . Asthma   . Depression   . Hyperlipemia     Past Surgical History:  Procedure Laterality Date  . ABDOMINAL HYSTERECTOMY    . BACK SURGERY     pins  . BACK SURGERY  2010  . BLADDER REPAIR    . CESAREAN SECTION  1982, 1984  . COLONOSCOPY N/A 06/12/2013   Procedure: COLONOSCOPY;  Surgeon: Rogene Houston, MD;  [facility];  Service: Endoscopy;  Laterality: N/A;  930-moved to 1020 Ann to notify pt    There were no vitals filed for this visit.       Subjective Assessment - 10/10/16 1309    Subjective Patient had RCR on R shoulder 09/23/16. She presents in a sling and wears it all the time. She is presently sleeping in her recliner.   Pertinent History asthma, back pain (2 surgeries 20 years ago),    Patient Stated Goals improve movement and decrease pain   Currently in Pain? Yes   Pain Score 8    Pain Location Shoulder   Pain Orientation Right   Pain Descriptors / Indicators --  feels like it's pulling apart   Pain Type Surgical pain   Pain Onset 1 to 4 weeks ago   Pain Frequency Constant   Aggravating Factors  if she moves it   Pain Relieving Factors heat    Effect of Pain on Daily Activities unable to use UE            [facility] PT Assessment - 10/10/16 0001      Assessment   Medical Diagnosis s/p R RCR   Referring Provider Remo Lipps Case MD   Onset Date/Surgical Date 09/23/16   Next MD Visit 11/07/16     Precautions   Precautions Shoulder   Type of Shoulder Precautions RCR 09/23/16     Balance Screen   Has the patient fallen in the past 6 months No   Has the patient had a decrease in activity level because of a fear of falling?  Yes   Is the patient reluctant to leave their home because of a fear of falling?  No     Home Environment   Living Environment Private residence   Living Arrangements Spouse/significant other   Type of [place]     Prior Function   Level of Independence Needs assistance with ADLs   Vocation On disability     Observation/Other Assessments   Focus on Therapeutic Outcomes (FOTO)  96% limited     ROM / Strength   AROM / PROM / Strength PROM     PROM   PROM Assessment Site Shoulder   Right/Left Shoulder Right   Right Shoulder Flexion 103 Degrees   Right Shoulder ABduction  72 Degrees   Right Shoulder Internal Rotation --  to waist at 40 deg ABD   Right Shoulder External Rotation 40 Degrees  a 40 deg ABD     Palpation   Palpation comment tender in R pecs, UT, infraspinatus and rhomboids                           PT Education - 10/10/16 1400    Education provided Yes   Education Details HEP; education on icing shoulder   Person(s) Educated Patient   Methods Explanation;Demonstration;Handout   Comprehension Verbalized understanding;Returned demonstration          PT Short Term Goals - 10/10/16 1416      PT SHORT TERM GOAL #1   Title I with HEP (11/07/16)   Time 4   Period Weeks   Status New     PT SHORT TERM GOAL #2   Title Decreased pain in R shoulder to 4/10.    Time 4   Period Weeks   Status New           PT Long Term Goals - 10/10/16 1431      PT LONG TERM GOAL #1   Title I with advanced HEP   Time 8   Period Weeks   Status New     PT LONG TERM GOAL #2   Title Patient to  demonstrate R shoulder ROM to within functional limits to perform ADLS.   Time 8   Period Weeks   Status New     PT LONG TERM GOAL #3   Title Patient able to perform ADLS with 1-9/50 pain or less   Time 8   Period Weeks   Status New     PT LONG TERM GOAL #4   Title Patient to demo 4+/5 R shoulder strength to ease ADLS.   Time 8   Period Weeks   Status New     PT LONG TERM GOAL #5   Title Patient able to sleep in her bed without waking from shoulder pain.   Time 8   Period Weeks   Status New               Plan - 10/10/16 1401    Clinical Impression Statement Patient had R RCR surgery on 09/23/16. She has limited ROM and pain in the right shoulder affecting ADLS. She presently sleeps in her recliner due to pain. She responded well to modalities today with decreased pain to 1/10 at end of treatment.   Rehab Potential Excellent   PT Frequency 2x / week   PT Duration 8 weeks   PT Treatment/Interventions ADLs/Self Care Home Management;Cryotherapy;Electrical Stimulation;Ultrasound;Therapeutic exercise;Therapeutic activities;Neuromuscular re-education;Manual techniques;Passive range of motion;Vasopneumatic Device   PT Next Visit Plan PROM progressing per protocol; modalities for pain and edema   PT Home Exercise Plan pendulum, thigh slides, ball squeeze   Consulted and Agree with Plan of Care Patient      Patient will benefit from skilled therapeutic intervention in order to improve the following deficits and impairments:  Abnormal gait, Pain, Impaired UE functional use, Decreased range of motion, Postural dysfunction, Decreased strength  Visit Diagnosis: Stiffness of right shoulder, not elsewhere classified - Plan: PT plan of care cert/re-cert  Acute pain of right shoulder - Plan: PT plan of care cert/re-cert  Localized edema - Plan: PT plan of care cert/re-cert      G-Codes - 93/26/71 1306    Functional  Assessment Tool Used (Outpatient Only) FOTO 96% LIMITED    Functional Limitation Other PT primary   Other PT Primary Current Status (L4103) At least 80 percent but less than 100 percent impaired, limited or restricted   Other PT Primary Goal Status (U1314) At least 40 percent but less than 60 percent impaired, limited or restricted       Problem List Patient Active Problem List   Diagnosis Date Noted  . LOWER LEG, ARTHRITIS, DEGEN./OSTEO 02/18/2009  . DERANGEMENT MENISCUS 02/18/2009  . [place], RIGHT 02/18/2009   Madelyn Flavors PT 10/10/2016, 4:02 PM  [facility] [address] [place], [place], [address] Phone: [phone]   Fax:  [phone]  Name: [name] MRN: [id number] Date of Birth: 1961/04/26

## 2016-10-12 ENCOUNTER — Encounter: Payer: Self-pay | Admitting: Physical Therapy

## 2016-10-12 ENCOUNTER — Ambulatory Visit: Payer: Medicare Other | Admitting: Physical Therapy

## 2016-10-12 DIAGNOSIS — M25611 Stiffness of right shoulder, not elsewhere classified: Secondary | ICD-10-CM

## 2016-10-12 DIAGNOSIS — R600 Localized edema: Secondary | ICD-10-CM

## 2016-10-12 DIAGNOSIS — M25511 Pain in right shoulder: Secondary | ICD-10-CM

## 2016-10-12 NOTE — Therapy (Signed)
[facility] [place], [place], [address] Phone: [phone]   Fax:  [phone]  Physical Therapy Treatment  Patient Details  Name: [name] MRN: [id number] Date of Birth: 1961/06/09 Referring Provider: Remo Lipps Case MD  Encounter Date: 10/12/2016      PT End of Session - 10/12/16 0844    Visit Number 2   Number of Visits 16   Date for PT Re-Evaluation 12/05/16   PT Start Time 0816   PT Stop Time 0856   PT Time Calculation (min) 40 min   Activity Tolerance Patient tolerated treatment well   Behavior During Therapy [facility] for tasks assessed/performed      Past Medical History:  Diagnosis Date  . Anxiety   . Asthma   . Depression   . Hyperlipemia     Past Surgical History:  Procedure Laterality Date  . ABDOMINAL HYSTERECTOMY    . BACK SURGERY     pins  . BACK SURGERY  2010  . BLADDER REPAIR    . CESAREAN SECTION  1982, 1984  . COLONOSCOPY N/A 06/12/2013   Procedure: COLONOSCOPY;  Surgeon: Rogene Houston, MD;  [facility];  Service: Endoscopy;  Laterality: N/A;  930-moved to 1020 Ann to notify pt    There were no vitals filed for this visit.      Subjective Assessment - 10/12/16 0820    Subjective Patient has ongong pain and hurts more today due to pain   Pertinent History asthma, back pain (2 surgeries 20 years ago),    Patient Stated Goals improve movement and decrease pain   Currently in Pain? Yes   Pain Score 8    Pain Location Shoulder   Pain Orientation Right   Pain Descriptors / Indicators Pressure;Sore   Pain Type Surgical pain   Pain Onset 1 to 4 weeks ago   Pain Frequency Constant   Aggravating Factors  any movement   Pain Relieving Factors at rest                         [facility] Adult PT Treatment/Exercise - 10/12/16 0001      Modalities   Modalities Vasopneumatic;Electrical Stimulation     Electrical Stimulation   Electrical Stimulation Location  right shoulder   Electrical Stimulation Action IFC 1-_0  [id number]   Electrical Stimulation Goals Pain     Vasopneumatic   Number Minutes Vasopneumatic  15 minutes   Vasopnuematic Location  Shoulder   Vasopneumatic Pressure Medium     Manual Therapy   Manual Therapy Passive ROM   Passive ROM gentle PROM for right shoulder flexion/IR/ER to end range                  PT Short Term Goals - 10/12/16 0845      PT SHORT TERM GOAL #1   Title I with HEP (11/07/16)   Time 4   Period Weeks   Status Achieved     PT SHORT TERM GOAL #2   Title Decreased pain in R shoulder to 4/10.    Time 4   Period Weeks   Status On-going           PT Long Term Goals - 10/10/16 1431      PT LONG TERM GOAL #1   Title I with advanced HEP   Time 8   Period Weeks   Status New     PT LONG TERM GOAL #2  Title Patient to demonstrate R shoulder ROM to within functional limits to perform ADLS.   Time 8   Period Weeks   Status New     PT LONG TERM GOAL #3   Title Patient able to perform ADLS with 4-6/50 pain or less   Time 8   Period Weeks   Status New     PT LONG TERM GOAL #4   Title Patient to demo 4+/5 R shoulder strength to ease ADLS.   Time 8   Period Weeks   Status New     PT LONG TERM GOAL #5   Title Patient able to sleep in her bed without waking from shoulder pain.   Time 8   Period Weeks   Status New               Plan - 10/12/16 0845    Clinical Impression Statement Patient tolerated treatment well today. Patient reported increased pain from weather today. Patient tolerated manual ROM well today yet required cues and ossilations to relax from guarding. Patient reports doing HEP daily per MPT instructed with no difficulty. Patient met STG #1 others ongoing due to protocol/ healing limitations.   Rehab Potential Excellent   PT Frequency 2x / week   PT Duration 8 weeks   PT Treatment/Interventions ADLs/Self Care Home Management;Cryotherapy;Electrical  Stimulation;Ultrasound;Therapeutic exercise;Therapeutic activities;Neuromuscular re-education;Manual techniques;Passive range of motion;Vasopneumatic Device   PT Next Visit Plan PROM progressing per protocol; modalities for pain and edema   Consulted and Agree with Plan of Care Patient      Patient will benefit from skilled therapeutic intervention in order to improve the following deficits and impairments:  Abnormal gait, Pain, Impaired UE functional use, Decreased range of motion, Postural dysfunction, Decreased strength  Visit Diagnosis: Stiffness of right shoulder, not elsewhere classified  Acute pain of right shoulder  Localized edema     Problem List Patient Active Problem List   Diagnosis Date Noted  . LOWER LEG, ARTHRITIS, DEGEN./OSTEO 02/18/2009  . DERANGEMENT MENISCUS 02/18/2009  . [name], RIGHT 02/18/2009    Phillips Climes, PTA 10/12/2016, 9:02 AM  [facility] [place], [place], [address] Phone: [phone]   Fax:  [phone]  Name: [name] MRN: [id number] Date of Birth: [birth year]

## 2016-10-13 ENCOUNTER — Other Ambulatory Visit: Payer: Self-pay | Admitting: Obstetrics & Gynecology

## 2016-10-17 ENCOUNTER — Encounter: Payer: Self-pay | Admitting: Physical Therapy

## 2016-10-17 ENCOUNTER — Ambulatory Visit: Payer: Medicare Other | Admitting: Physical Therapy

## 2016-10-17 DIAGNOSIS — M25511 Pain in right shoulder: Secondary | ICD-10-CM

## 2016-10-17 DIAGNOSIS — R600 Localized edema: Secondary | ICD-10-CM

## 2016-10-17 DIAGNOSIS — M25611 Stiffness of right shoulder, not elsewhere classified: Secondary | ICD-10-CM

## 2016-10-17 NOTE — Therapy (Signed)
[facility] [place], [place], [address] Phone: [phone]   Fax:  [phone]  Physical Therapy Treatment  Patient Details  Name: [name] MRN: [id number] Date of Birth: 1960-08-11 Referring Provider: Remo Lipps Case MD  Encounter Date: 10/17/2016      PT End of Session - 10/17/16 0813    Visit Number 3   Number of Visits 16   Date for PT Re-Evaluation 12/05/16   PT Start Time 0817   PT Stop Time 0903   PT Time Calculation (min) 46 min   Activity Tolerance Patient tolerated treatment well   Behavior During Therapy [facility] for tasks assessed/performed      Past Medical History:  Diagnosis Date  . Anxiety   . Asthma   . Depression   . Hyperlipemia     Past Surgical History:  Procedure Laterality Date  . ABDOMINAL HYSTERECTOMY    . BACK SURGERY     pins  . BACK SURGERY  2010  . BLADDER REPAIR    . CESAREAN SECTION  1982, 1984  . COLONOSCOPY N/A 06/12/2013   Procedure: COLONOSCOPY;  Surgeon: Rogene Houston, MD;  [facility];  Service: Endoscopy;  Laterality: N/A;  930-moved to 1020 Ann to notify pt    There were no vitals filed for this visit.      Subjective Assessment - 10/17/16 0812    Subjective Reports she is doing better with it today. Reports compliance with HEP.   Pertinent History asthma, back pain (2 surgeries 20 years ago),    Patient Stated Goals improve movement and decrease pain   Currently in Pain? Yes   Pain Score 8    Pain Location Shoulder   Pain Orientation Right   Pain Descriptors / Indicators Discomfort   Pain Type Surgical pain   Pain Onset 1 to 4 weeks ago            [facility] PT Assessment - 10/17/16 0001      Assessment   Medical Diagnosis s/p R RCR   Onset Date/Surgical Date 09/23/16   Next MD Visit 11/07/16     Precautions   Precautions Shoulder   Type of Shoulder Precautions RCR 09/23/16                     [facility] Adult PT  Treatment/Exercise - 10/17/16 0001      Modalities   Modalities Vasopneumatic;Electrical Stimulation     Electrical Stimulation   Electrical Stimulation Location R shoulder   Electrical Stimulation Action IFC   Electrical Stimulation Parameters 1-10 hz x15 min   Electrical Stimulation Goals Pain     Vasopneumatic   Number Minutes Vasopneumatic  15 minutes   Vasopnuematic Location  Shoulder   Vasopneumatic Pressure Medium   Vasopneumatic Temperature  63     Manual Therapy   Manual Therapy Passive ROM   Passive ROM PROM of R shoulder into flex/ER/IR with holds at end range to promote ROM                  PT Short Term Goals - 10/12/16 0845      PT SHORT TERM GOAL #1   Title I with HEP (11/07/16)   Time 4   Period Weeks   Status Achieved     PT SHORT TERM GOAL #2   Title Decreased pain in R shoulder to 4/10.    Time 4   Period Weeks   Status On-going  PT Long Term Goals - 10/10/16 1431      PT LONG TERM GOAL #1   Title I with advanced HEP   Time 8   Period Weeks   Status New     PT LONG TERM GOAL #2   Title Patient to demonstrate R shoulder ROM to within functional limits to perform ADLS.   Time 8   Period Weeks   Status New     PT LONG TERM GOAL #3   Title Patient able to perform ADLS with 7-1/06 pain or less   Time 8   Period Weeks   Status New     PT LONG TERM GOAL #4   Title Patient to demo 4+/5 R shoulder strength to ease ADLS.   Time 8   Period Weeks   Status New     PT LONG TERM GOAL #5   Title Patient able to sleep in her bed without waking from shoulder pain.   Time 8   Period Weeks   Status New               Plan - 10/17/16 0850    Clinical Impression Statement Patient tolerated today's treatment fairly well as she arrived with high level R shoulder pain today. Patient facial grimacing and two audible pops noted with PROM of R shoulder into flexion. Good overall R shoulder ROM noted today during session with  no increased pain reported by patient. Patient required VCs regarding R shoulder relaxation with oscillations required to promote relaxation. Normal modalities response noted following removal of the modalities. Goals remain on-going secondary to protocol limitations.   Rehab Potential Excellent   PT Frequency 2x / week   PT Duration 8 weeks   PT Treatment/Interventions ADLs/Self Care Home Management;Cryotherapy;Electrical Stimulation;Ultrasound;Therapeutic exercise;Therapeutic activities;Neuromuscular re-education;Manual techniques;Passive range of motion;Vasopneumatic Device   PT Next Visit Plan PROM progressing per protocol; modalities for pain and edema   PT Home Exercise Plan pendulum, thigh slides, ball squeeze   Consulted and Agree with Plan of Care Patient      Patient will benefit from skilled therapeutic intervention in order to improve the following deficits and impairments:  Abnormal gait, Pain, Impaired UE functional use, Decreased range of motion, Postural dysfunction, Decreased strength  Visit Diagnosis: Stiffness of right shoulder, not elsewhere classified  Acute pain of right shoulder  Localized edema     Problem List Patient Active Problem List   Diagnosis Date Noted  . LOWER LEG, ARTHRITIS, DEGEN./OSTEO 02/18/2009  . DERANGEMENT MENISCUS 02/18/2009  . [name], RIGHT 02/18/2009    Wynelle Fanny, PTA 10/17/2016, 9:05 AM  [facility] [place], [place], [address] Phone: [phone]   Fax:  [phone]  Name: [name] MRN: [id number] Date of Birth: [birth year]

## 2016-10-20 ENCOUNTER — Encounter: Payer: Self-pay | Admitting: Physical Therapy

## 2016-10-20 ENCOUNTER — Ambulatory Visit: Payer: Medicare Other | Admitting: Physical Therapy

## 2016-10-20 DIAGNOSIS — M25611 Stiffness of right shoulder, not elsewhere classified: Secondary | ICD-10-CM | POA: Diagnosis not present

## 2016-10-20 DIAGNOSIS — M25511 Pain in right shoulder: Secondary | ICD-10-CM

## 2016-10-20 DIAGNOSIS — R600 Localized edema: Secondary | ICD-10-CM

## 2016-10-20 NOTE — Therapy (Signed)
[facility] [place], [place], [address] Phone: [phone]   Fax:  [phone]  Physical Therapy Treatment  Patient Details  Name: [name] MRN: [id number] Date of Birth: 1960-12-17 Referring Provider: Remo Lipps Case MD  Encounter Date: 10/20/2016      PT End of Session - 10/20/16 0833    Visit Number 4   Number of Visits 16   Date for PT Re-Evaluation 12/05/16   PT Start Time 0815   PT Stop Time 4492   PT Time Calculation (min) 42 min   Activity Tolerance Patient tolerated treatment well   Behavior During Therapy [facility] for tasks assessed/performed      Past Medical History:  Diagnosis Date  . Anxiety   . Asthma   . Depression   . Hyperlipemia     Past Surgical History:  Procedure Laterality Date  . ABDOMINAL HYSTERECTOMY    . BACK SURGERY     pins  . BACK SURGERY  2010  . BLADDER REPAIR    . CESAREAN SECTION  1982, 1984  . COLONOSCOPY N/A 06/12/2013   Procedure: COLONOSCOPY;  Surgeon: Rogene Houston, MD;  [facility];  Service: Endoscopy;  Laterality: N/A;  930-moved to 1020 Ann to notify pt    There were no vitals filed for this visit.      Subjective Assessment - 10/20/16 0823    Subjective Patient arrived with increased ache for unknown reason   Pertinent History asthma, back pain (2 surgeries 20 years ago),    Patient Stated Goals improve movement and decrease pain   Currently in Pain? Yes   Pain Score 7    Pain Location Shoulder   Pain Orientation Right   Pain Descriptors / Indicators Discomfort   Pain Type Surgical pain   Pain Onset 1 to 4 weeks ago   Pain Frequency Constant   Aggravating Factors  any movement   Pain Relieving Factors at rest            [facility] PT Assessment - 10/20/16 0001      PROM   PROM Assessment Site Shoulder   Right/Left Shoulder Right   Right Shoulder Flexion 120 Degrees   Right Shoulder External Rotation 70 Degrees                      [facility] Adult PT Treatment/Exercise - 10/20/16 0001      Electrical Stimulation   Electrical Stimulation Location R shoulder   Electrical Stimulation Action IFC   Electrical Stimulation Parameters 1-10hz  x63min   Electrical Stimulation Goals Pain     Vasopneumatic   Number Minutes Vasopneumatic  15 minutes   Vasopnuematic Location  Shoulder   Vasopneumatic Pressure Medium     Manual Therapy   Manual Therapy Passive ROM   Passive ROM PROM of R shoulder into flex/ER/IR with holds at end range to promote ROM                  PT Short Term Goals - 10/12/16 0845      PT SHORT TERM GOAL #1   Title I with HEP (11/07/16)   Time 4   Period Weeks   Status Achieved     PT SHORT TERM GOAL #2   Title Decreased pain in R shoulder to 4/10.    Time 4   Period Weeks   Status On-going           PT Long Term  Goals - 10/10/16 1431      PT LONG TERM GOAL #1   Title I with advanced HEP   Time 8   Period Weeks   Status New     PT LONG TERM GOAL #2   Title Patient to demonstrate R shoulder ROM to within functional limits to perform ADLS.   Time 8   Period Weeks   Status New     PT LONG TERM GOAL #3   Title Patient able to perform ADLS with 7-4/94 pain or less   Time 8   Period Weeks   Status New     PT LONG TERM GOAL #4   Title Patient to demo 4+/5 R shoulder strength to ease ADLS.   Time 8   Period Weeks   Status New     PT LONG TERM GOAL #5   Title Patient able to sleep in her bed without waking from shoulder pain.   Time 8   Period Weeks   Status New               Plan - 10/20/16 0845    Clinical Impression Statement Patient tolerated treatment well today. Patient reported that she was lifting arm without sling on. Educated patient on protocol and use of sling and following orders until MD changes POC to avoid re-injury. Patient has improved PROM for right shoulder flexion and ER today. Patient had no noted tightness  in shoulder today only pain complaints with any movement. Goals ongoing due to protocol.   Rehab Potential Excellent   Clinical Impairments Affecting Rehab Potential surgery 09/23/16 and current 4 weeks as of 10/21/16   PT Frequency 2x / week   PT Duration 8 weeks   PT Treatment/Interventions ADLs/Self Care Home Management;Cryotherapy;Electrical Stimulation;Ultrasound;Therapeutic exercise;Therapeutic activities;Neuromuscular re-education;Manual techniques;Passive range of motion;Vasopneumatic Device   PT Next Visit Plan PROM progressing per protocol; modalities for pain and edema (MD. Case 11/07/16)   Consulted and Agree with Plan of Care Patient      Patient will benefit from skilled therapeutic intervention in order to improve the following deficits and impairments:  Abnormal gait, Pain, Impaired UE functional use, Decreased range of motion, Postural dysfunction, Decreased strength  Visit Diagnosis: Stiffness of right shoulder, not elsewhere classified  Acute pain of right shoulder  Localized edema     Problem List Patient Active Problem List   Diagnosis Date Noted  . LOWER LEG, ARTHRITIS, DEGEN./OSTEO 02/18/2009  . DERANGEMENT MENISCUS 02/18/2009  . ANSERINE BURSITIS, RIGHT 02/18/2009    Phillips Climes, PTA 10/20/2016, 9:11 AM  [facility] [place], [place], [address] Phone: [phone]   Fax:  [phone]  Name: [name] MRN: [id number] Date of Birth: [birth year]

## 2016-10-24 ENCOUNTER — Ambulatory Visit: Payer: Medicare Other | Admitting: Physical Therapy

## 2016-10-24 ENCOUNTER — Encounter: Payer: Self-pay | Admitting: Physical Therapy

## 2016-10-24 DIAGNOSIS — R600 Localized edema: Secondary | ICD-10-CM

## 2016-10-24 DIAGNOSIS — M25611 Stiffness of right shoulder, not elsewhere classified: Secondary | ICD-10-CM | POA: Diagnosis not present

## 2016-10-24 DIAGNOSIS — M25511 Pain in right shoulder: Secondary | ICD-10-CM

## 2016-10-24 NOTE — Therapy (Signed)
[facility] [place], [place], [address] Phone: [phone]   Fax:  [phone]  Physical Therapy Treatment  Patient Details  Name: [name] MRN: [id number] Date of Birth: [birth year] Referring Provider: Remo Lipps Case MD  Encounter Date: 10/24/2016      PT End of Session - 10/24/16 0832    Visit Number 5   Number of Visits 16   Date for PT Re-Evaluation 12/05/16   PT Start Time 0815   PT Stop Time 0900   PT Time Calculation (min) 45 min   Activity Tolerance Patient tolerated treatment well   Behavior During Therapy [facility] for tasks assessed/performed      Past Medical History:  Diagnosis Date  . Anxiety   . Asthma   . Depression   . Hyperlipemia     Past Surgical History:  Procedure Laterality Date  . ABDOMINAL HYSTERECTOMY    . BACK SURGERY     pins  . BACK SURGERY  2010  . BLADDER REPAIR    . CESAREAN SECTION  1982, 1984  . COLONOSCOPY N/A 06/12/2013   Procedure: COLONOSCOPY;  Surgeon: Rogene Houston, MD;  [facility];  Service: Endoscopy;  Laterality: N/A;  930-moved to 1020 Ann to notify pt    There were no vitals filed for this visit.      Subjective Assessment - 10/24/16 1962    Subjective Patient reported ongoing pain in shoulder   Pertinent History asthma, back pain (2 surgeries 20 years ago),    Patient Stated Goals improve movement and decrease pain   Currently in Pain? Yes   Pain Score 4    Pain Descriptors / Indicators Sore;Discomfort   Pain Type Surgical pain   Pain Onset 1 to 4 weeks ago   Pain Frequency Constant   Aggravating Factors  any movement   Pain Relieving Factors at rest            [facility] PT Assessment - 10/24/16 0001      PROM   PROM Assessment Site Shoulder   Right/Left Shoulder Right   Right Shoulder Flexion 148 Degrees   Right Shoulder External Rotation 74 Degrees                     [facility] Adult PT Treatment/Exercise - 10/24/16  0001      Electrical Stimulation   Electrical Stimulation Location R shoulder   Electrical Stimulation Action IFC   Electrical Stimulation Parameters 1-10hz  x 62min   Electrical Stimulation Goals Pain     Vasopneumatic   Number Minutes Vasopneumatic  15 minutes   Vasopnuematic Location  Shoulder   Vasopneumatic Pressure Medium     Manual Therapy   Manual Therapy Passive ROM   Passive ROM PROM of R shoulder into flex/ER/IR with holds at end range to promote ROM                  PT Short Term Goals - 10/12/16 0845      PT SHORT TERM GOAL #1   Title I with HEP (11/07/16)   Time 4   Period Weeks   Status Achieved     PT SHORT TERM GOAL #2   Title Decreased pain in R shoulder to 4/10.    Time 4   Period Weeks   Status On-going           PT Long Term Goals - 10/10/16 1431      PT LONG  TERM GOAL #1   Title I with advanced HEP   Time 8   Period Weeks   Status New     PT LONG TERM GOAL #2   Title Patient to demonstrate R shoulder ROM to within functional limits to perform ADLS.   Time 8   Period Weeks   Status New     PT LONG TERM GOAL #3   Title Patient able to perform ADLS with 6-2/83 pain or less   Time 8   Period Weeks   Status New     PT LONG TERM GOAL #4   Title Patient to demo 4+/5 R shoulder strength to ease ADLS.   Time 8   Period Weeks   Status New     PT LONG TERM GOAL #5   Title Patient able to sleep in her bed without waking from shoulder pain.   Time 8   Period Weeks   Status New               Plan - 10/24/16 1517    Clinical Impression Statement Patient tolerated treatment well today. Patient has reported ongoing soreness yet some improvement today upon arrival. Patient has improved with PROM for right shoulder flexion and ER today. Patient goals ongoing due to healing/protocol.   Rehab Potential Excellent   Clinical Impairments Affecting Rehab Potential surgery 09/23/16 and current 4 weeks as of 10/21/16   PT Frequency  2x / week   PT Duration 8 weeks   PT Next Visit Plan PROM progressing per protocol; modalities for pain and edema (MD. Case 11/07/16)   Consulted and Agree with Plan of Care Patient      Patient will benefit from skilled therapeutic intervention in order to improve the following deficits and impairments:  Abnormal gait, Pain, Impaired UE functional use, Decreased range of motion, Postural dysfunction, Decreased strength  Visit Diagnosis: Stiffness of right shoulder, not elsewhere classified  Acute pain of right shoulder  Localized edema     Problem List Patient Active Problem List   Diagnosis Date Noted  . LOWER LEG, ARTHRITIS, DEGEN./OSTEO 02/18/2009  . DERANGEMENT MENISCUS 02/18/2009  . [name], RIGHT 02/18/2009    Phillips Climes, PTA 10/24/2016, 9:01 AM  [facility] [place], [place], [address] Phone: [phone]   Fax:  [phone]  Name: TANNIE KOSKELA MRN: [id number] Date of Birth: 19-May-1961

## 2016-10-27 ENCOUNTER — Encounter: Payer: Self-pay | Admitting: Physical Therapy

## 2016-10-27 ENCOUNTER — Ambulatory Visit: Payer: Medicare Other | Admitting: Physical Therapy

## 2016-10-27 DIAGNOSIS — M25511 Pain in right shoulder: Secondary | ICD-10-CM

## 2016-10-27 DIAGNOSIS — M25611 Stiffness of right shoulder, not elsewhere classified: Secondary | ICD-10-CM | POA: Diagnosis not present

## 2016-10-27 DIAGNOSIS — R600 Localized edema: Secondary | ICD-10-CM

## 2016-10-27 NOTE — Therapy (Signed)
[facility] [place], [place], [address] Phone: [phone]   Fax:  [phone]  Physical Therapy Treatment  Patient Details  Name: [name] MRN: [id number] Date of Birth: May 22, 1961 Referring Provider: Remo Lipps Case MD  Encounter Date: 10/27/2016      PT End of Session - 10/27/16 0845    Visit Number 6   Number of Visits 16   Date for PT Re-Evaluation 12/05/16   PT Start Time 0814   PT Stop Time 0855   PT Time Calculation (min) 41 min   Activity Tolerance Patient tolerated treatment well   Behavior During Therapy [facility] for tasks assessed/performed      Past Medical History:  Diagnosis Date  . Anxiety   . Asthma   . Depression   . Hyperlipemia     Past Surgical History:  Procedure Laterality Date  . ABDOMINAL HYSTERECTOMY    . BACK SURGERY     pins  . BACK SURGERY  2010  . BLADDER REPAIR    . CESAREAN SECTION  1982, 1984  . COLONOSCOPY N/A 06/12/2013   Procedure: COLONOSCOPY;  Surgeon: Rogene Houston, MD;  [facility];  Service: Endoscopy;  Laterality: N/A;  930-moved to 1020 Ann to notify pt    There were no vitals filed for this visit.      Subjective Assessment - 10/27/16 0821    Subjective Patient reported ongoing increased soreness in shouler post shoulder from doing house work and using shoulder   Pertinent History asthma, back pain (2 surgeries 20 years ago),    Patient Stated Goals improve movement and decrease pain   Currently in Pain? Yes   Pain Score 5    Pain Location Shoulder   Pain Orientation Right   Pain Descriptors / Indicators Sore;Aching   Pain Type Surgical pain   Pain Onset 1 to 4 weeks ago   Pain Frequency Constant   Aggravating Factors  use and movement   Pain Relieving Factors at rest            [facility] PT Assessment - 10/27/16 0001      PROM   PROM Assessment Site Shoulder   Right/Left Shoulder Right   Right Shoulder Flexion 150 Degrees   Right  Shoulder External Rotation 76 Degrees                     [facility] Adult PT Treatment/Exercise - 10/27/16 0001      Electrical Stimulation   Electrical Stimulation Location R shoulder   Electrical Stimulation Action IFC   Electrical Stimulation Parameters 1-10hz  x65min     Vasopneumatic   Number Minutes Vasopneumatic  15 minutes   Vasopnuematic Location  Shoulder   Vasopneumatic Pressure Medium     Manual Therapy   Manual Therapy Passive ROM   Passive ROM PROM of R shoulder into flex/ER/IR with holds at end range to promote ROM                  PT Short Term Goals - 10/12/16 0845      PT SHORT TERM GOAL #1   Title I with HEP (11/07/16)   Time 4   Period Weeks   Status Achieved     PT SHORT TERM GOAL #2   Title Decreased pain in R shoulder to 4/10.    Time 4   Period Weeks   Status On-going  PT Long Term Goals - 10/10/16 1431      PT LONG TERM GOAL #1   Title I with advanced HEP   Time 8   Period Weeks   Status New     PT LONG TERM GOAL #2   Title Patient to demonstrate R shoulder ROM to within functional limits to perform ADLS.   Time 8   Period Weeks   Status New     PT LONG TERM GOAL #3   Title Patient able to perform ADLS with 1-7/49 pain or less   Time 8   Period Weeks   Status New     PT LONG TERM GOAL #4   Title Patient to demo 4+/5 R shoulder strength to ease ADLS.   Time 8   Period Weeks   Status New     PT LONG TERM GOAL #5   Title Patient able to sleep in her bed without waking from shoulder pain.   Time 8   Period Weeks   Status New               Plan - 10/27/16 0846    Clinical Impression Statement Patient tolerated treatment well today. Patient has improved PROM for right shoulder flexion and ER. Patient has reported using her right shoulder for ADL's. Educated patient on [facility]CR protocol to avoid re-injury. Patient goals ongoing due to healing protocol limitations.    Rehab Potential Excellent    Clinical Impairments Affecting Rehab Potential surgery 09/23/16 and current 5 weeks as of 10/27/16   PT Frequency 2x / week   PT Duration 8 weeks   PT Treatment/Interventions ADLs/Self Care Home Management;Cryotherapy;Electrical Stimulation;Ultrasound;Therapeutic exercise;Therapeutic activities;Neuromuscular re-education;Manual techniques;Passive range of motion;Vasopneumatic Device   PT Next Visit Plan PROM progressing per protocol; modalities for pain and edema (MD. Case 11/07/16)   Consulted and Agree with Plan of Care Patient      Patient will benefit from skilled therapeutic intervention in order to improve the following deficits and impairments:  Abnormal gait, Pain, Impaired UE functional use, Decreased range of motion, Postural dysfunction, Decreased strength  Visit Diagnosis: Stiffness of right shoulder, not elsewhere classified  Acute pain of right shoulder  Localized edema     Problem List Patient Active Problem List   Diagnosis Date Noted  . LOWER LEG, ARTHRITIS, DEGEN./OSTEO 02/18/2009  . DERANGEMENT MENISCUS 02/18/2009  . [name], RIGHT 02/18/2009    Phillips Climes, PTA 10/27/2016, 8:57 AM  [facility] [address] [place], [place], [address] Phone: [phone]   Fax:  [phone]  Name: [name] MRN: [id number] Date of Birth: Sep 19, 1960

## 2016-11-03 ENCOUNTER — Encounter: Payer: Self-pay | Admitting: Physical Therapy

## 2016-11-03 ENCOUNTER — Ambulatory Visit: Payer: Medicare Other | Admitting: Physical Therapy

## 2016-11-03 DIAGNOSIS — M25511 Pain in right shoulder: Secondary | ICD-10-CM

## 2016-11-03 DIAGNOSIS — M25611 Stiffness of right shoulder, not elsewhere classified: Secondary | ICD-10-CM | POA: Diagnosis not present

## 2016-11-03 DIAGNOSIS — R600 Localized edema: Secondary | ICD-10-CM

## 2016-11-03 NOTE — Therapy (Signed)
[facility] [place], [place], [address] Phone: [phone]   Fax:  [phone]  Physical Therapy Treatment  Patient Details  Name: [name] MRN: [id number] Date of Birth: 1961/06/25 Referring Provider: Remo Lipps Case MD  Encounter Date: 11/03/2016      PT End of Session - 11/03/16 0829    Visit Number 7   Number of Visits 16   Date for PT Re-Evaluation 12/05/16   PT Start Time 0814   PT Stop Time 0857   PT Time Calculation (min) 43 min   Activity Tolerance Patient tolerated treatment well   Behavior During Therapy [facility] for tasks assessed/performed      Past Medical History:  Diagnosis Date  . Anxiety   . Asthma   . Depression   . Hyperlipemia     Past Surgical History:  Procedure Laterality Date  . ABDOMINAL HYSTERECTOMY    . BACK SURGERY     pins  . BACK SURGERY  2010  . BLADDER REPAIR    . CESAREAN SECTION  1982, 1984  . COLONOSCOPY N/A 06/12/2013   Procedure: COLONOSCOPY;  Surgeon: Rogene Houston, MD;  [facility];  Service: Endoscopy;  Laterality: N/A;  930-moved to 1020 Ann to notify pt    There were no vitals filed for this visit.      Subjective Assessment - 11/03/16 5102    Subjective Patient reported more pain today in shoulder today   Pertinent History asthma, back pain (2 surgeries 20 years ago),    Patient Stated Goals improve movement and decrease pain   Currently in Pain? Yes   Pain Score 8    Pain Location Shoulder   Pain Orientation Right   Pain Descriptors / Indicators Aching;Sore   Pain Type Surgical pain   Pain Onset 1 to 4 weeks ago   Pain Frequency Constant   Aggravating Factors  use and movement   Pain Relieving Factors at rest            [facility] PT Assessment - 11/03/16 0001      PROM   PROM Assessment Site Shoulder   Right Shoulder Flexion 155 Degrees   Right Shoulder External Rotation 78 Degrees                     [facility] Adult PT  Treatment/Exercise - 11/03/16 0001      Electrical Stimulation   Electrical Stimulation Location R shoulder   Electrical Stimulation Action IFC   Electrical Stimulation Parameters 1-10hz  x24min   Electrical Stimulation Goals Pain     Vasopneumatic   Number Minutes Vasopneumatic  15 minutes   Vasopnuematic Location  Shoulder   Vasopneumatic Pressure Medium     Manual Therapy   Manual Therapy Passive ROM   Passive ROM PROM of R shoulder into flex/ER/IR with holds at end range to promote ROM                  PT Short Term Goals - 10/12/16 0845      PT SHORT TERM GOAL #1   Title I with HEP (11/07/16)   Time 4   Period Weeks   Status Achieved     PT SHORT TERM GOAL #2   Title Decreased pain in R shoulder to 4/10.    Time 4   Period Weeks   Status On-going           PT Long Term Goals - 11/03/16 5852  PT LONG TERM GOAL #1   Title I with advanced HEP   Time 8   Period Weeks   Status On-going     PT LONG TERM GOAL #2   Title Patient to demonstrate R shoulder ROM to within functional limits to perform ADLS.   Time 8   Period Weeks   Status On-going     PT LONG TERM GOAL #3   Title Patient able to perform ADLS with 1-4/48 pain or less   Time 8   Period Weeks   Status On-going     PT LONG TERM GOAL #4   Title Patient to demo 4+/5 R shoulder strength to ease ADLS.   Time 8   Period Weeks   Status On-going     PT LONG TERM GOAL #5   Title Patient able to sleep in her bed without waking from shoulder pain.   Time 8   Period Weeks   Status On-going               Plan - 11/03/16 0831    Clinical Impression Statement Patient tolerated treatment fairly well today with reportes of increased pain the past week. Patient has reported using arm some for ADL's which may have caused her pain in shoulder. Educated patient on protocol and healing to avoid re-injury. Patient continues to improve ROM in right shoulder for flexion and ER. Patient current  goals ongoing due to protocol limitations.    Rehab Potential Excellent   Clinical Impairments Affecting Rehab Potential surgery 09/23/16 and current 6 weeks as of 11/03/16   PT Frequency 2x / week   PT Duration 8 weeks   PT Treatment/Interventions ADLs/Self Care Home Management;Cryotherapy;Electrical Stimulation;Ultrasound;Therapeutic exercise;Therapeutic activities;Neuromuscular re-education;Manual techniques;Passive range of motion;Vasopneumatic Device   PT Next Visit Plan PROM progressing per protocol; modalities for pain and edema (MD. Case 11/07/16)   Consulted and Agree with Plan of Care Patient      Patient will benefit from skilled therapeutic intervention in order to improve the following deficits and impairments:  Abnormal gait, Pain, Impaired UE functional use, Decreased range of motion, Postural dysfunction, Decreased strength  Visit Diagnosis: Stiffness of right shoulder, not elsewhere classified  Acute pain of right shoulder  Localized edema     Problem List Patient Active Problem List   Diagnosis Date Noted  . LOWER LEG, ARTHRITIS, DEGEN./OSTEO 02/18/2009  . DERANGEMENT MENISCUS 02/18/2009  . [name], RIGHT 02/18/2009   Ladean Raya, PTA 11/03/16 9:11 AM Mali Applegate MPT [facility] [place], [place], [address] Phone: [phone]   Fax:  [phone]  Name: [name] MRN: [id number] Date of Birth: 1961-07-01

## 2016-11-11 ENCOUNTER — Ambulatory Visit: Payer: Medicare Other | Attending: Orthopedic Surgery | Admitting: Physical Therapy

## 2016-11-11 DIAGNOSIS — M25511 Pain in right shoulder: Secondary | ICD-10-CM | POA: Insufficient documentation

## 2016-11-11 DIAGNOSIS — M25611 Stiffness of right shoulder, not elsewhere classified: Secondary | ICD-10-CM

## 2016-11-11 DIAGNOSIS — R600 Localized edema: Secondary | ICD-10-CM | POA: Diagnosis present

## 2016-11-11 NOTE — Therapy (Signed)
[facility] [place], [place], [address] Phone: [phone]   Fax:  [phone]  Physical Therapy Treatment  Patient Details  Name: [name] MRN: [id number] Date of Birth: 10/06/1960 Referring Provider: Remo Lipps Case MD  Encounter Date: 11/11/2016      PT End of Session - 11/11/16 1035    Visit Number 8   Number of Visits 16   Date for PT Re-Evaluation 12/05/16   PT Start Time 0109   PT Stop Time 1127   PT Time Calculation (min) 52 min   Activity Tolerance Patient tolerated treatment well   Behavior During Therapy [facility] for tasks assessed/performed      Past Medical History:  Diagnosis Date  . Anxiety   . Asthma   . Depression   . Hyperlipemia     Past Surgical History:  Procedure Laterality Date  . ABDOMINAL HYSTERECTOMY    . BACK SURGERY     pins  . BACK SURGERY  2010  . BLADDER REPAIR    . CESAREAN SECTION  1982, 1984  . COLONOSCOPY N/A 06/12/2013   Procedure: COLONOSCOPY;  Surgeon: Rogene Houston, MD;  [facility];  Service: Endoscopy;  Laterality: N/A;  930-moved to 1020 Ann to notify pt    There were no vitals filed for this visit.      Subjective Assessment - 11/11/16 1035    Subjective Patient states she is tender. She went to MD on 11/07/16 and he took her out of sling and said to continue PT 1 day/wk.   Pertinent History asthma, back pain (2 surgeries 20 years ago),    Patient Stated Goals improve movement and decrease pain   Currently in Pain? Yes   Pain Score 4    Pain Location Shoulder   Pain Orientation Right                         [facility] Adult PT Treatment/Exercise - 11/11/16 0001      Exercises   Exercises Shoulder     Shoulder Exercises: Standing   Retraction Strengthening;Both;10 reps     Shoulder Exercises: Pulleys   Flexion 3 minutes   ABduction 2 minutes   ABduction Limitations Scaption   Other Pulley Exercises UE ranger standing ER x  20 with towel under arm  VCs to not hike shoulder and to look straight ahead   Other Pulley Exercises wall ladder flex x 10     Modalities   Modalities Electrical Stimulation;Vasopneumatic     Electrical Stimulation   Electrical Stimulation Location R shoulder   Electrical Stimulation Action IFC   Electrical Stimulation Parameters 1-10 Hz x 58mn   Electrical Stimulation Goals Edema;Pain     Vasopneumatic   Number Minutes Vasopneumatic  15 minutes   Vasopnuematic Location  Shoulder   Vasopneumatic Pressure Medium     Manual Therapy   Manual Therapy Passive ROM   Passive ROM to R shoulder all planes                  PT Short Term Goals - 10/12/16 0845      PT SHORT TERM GOAL #1   Title I with HEP (11/07/16)   Time 4   Period Weeks   Status Achieved     PT SHORT TERM GOAL #2   Title Decreased pain in R shoulder to 4/10.    Time 4   Period Weeks   Status On-going  PT Long Term Goals - 11/11/16 1119      PT LONG TERM GOAL #1   Title I with advanced HEP   Time 8   Period Weeks   Status On-going     PT LONG TERM GOAL #2   Title Patient to demonstrate R shoulder ROM to within functional limits to perform ADLS.   Time 8   Period Weeks   Status Partially Met     PT LONG TERM GOAL #3   Title Patient able to perform ADLS with 7-7/11 pain or less   Time 8   Period Weeks   Status On-going     PT LONG TERM GOAL #4   Title Patient to demo 4+/5 R shoulder strength to ease ADLS.   Time 8   Period Weeks   Status On-going     PT LONG TERM GOAL #5   Title Patient able to sleep in her bed without waking from shoulder pain.   Time 8   Period Weeks   Status Achieved               Plan - 11/11/16 1116    Clinical Impression Statement Patient did very well with AAROM with no complaint of increased pain. She is doing very well with ROM demonstrating full IR/ER PROM. PT restated need to be careful with arm as shoulder is still healing. LTG  #5 has been met.   Rehab Potential Excellent   Clinical Impairments Affecting Rehab Potential surgery 09/23/16 and current 6 weeks as of 11/03/16   PT Frequency 2x / week   PT Duration 8 weeks   PT Treatment/Interventions ADLs/Self Care Home Management;Cryotherapy;Electrical Stimulation;Ultrasound;Therapeutic exercise;Therapeutic activities;Neuromuscular re-education;Manual techniques;Passive range of motion;Vasopneumatic Device   PT Next Visit Plan PROM progressing per protocol; modalities for pain and edema.   PT Home Exercise Plan pendulum, thigh slides, ball squeeze   Consulted and Agree with Plan of Care Patient      Patient will benefit from skilled therapeutic intervention in order to improve the following deficits and impairments:  Abnormal gait, Pain, Impaired UE functional use, Decreased range of motion, Postural dysfunction, Decreased strength  Visit Diagnosis: Stiffness of right shoulder, not elsewhere classified  Acute pain of right shoulder  Localized edema     Problem List Patient Active Problem List   Diagnosis Date Noted  . LOWER LEG, ARTHRITIS, DEGEN./OSTEO 02/18/2009  . DERANGEMENT MENISCUS 02/18/2009  . [name], RIGHT 02/18/2009    Madelyn Flavors PT 11/11/2016, 11:26 AM  [facility] [address] [place], [place], [address] Phone: [phone]   Fax:  [phone]  Name: [name] MRN: [id number] Date of Birth: 11-23-1960

## 2016-11-16 ENCOUNTER — Ambulatory Visit: Payer: Medicare Other | Admitting: Physical Therapy

## 2016-11-16 ENCOUNTER — Encounter: Payer: Self-pay | Admitting: Physical Therapy

## 2016-11-16 DIAGNOSIS — M25611 Stiffness of right shoulder, not elsewhere classified: Secondary | ICD-10-CM

## 2016-11-16 DIAGNOSIS — R600 Localized edema: Secondary | ICD-10-CM

## 2016-11-16 DIAGNOSIS — M25511 Pain in right shoulder: Secondary | ICD-10-CM

## 2016-11-16 NOTE — Patient Instructions (Signed)
Strengthening: Isometric Flexion   Using wall for resistance, press right fist into ball using light pressure. Hold __5__ seconds. Repeat __10__ times per set. Do __2__ sets per session. Do _2___ sessions per day.   Strengthening: Isometric Extension   Using wall for resistance, press back of right arm into ball using light pressure. Hold _5___ seconds. Repeat __10__ times per set. Do __2__ sets per session. Do __2__ sessions per day.   Strengthening: Isometric External Rotation   Using wall to provide resistance, and keeping right arm at side, press back of hand into ball using light pressure. Hold __5__ seconds. Repeat __10__ times per set. Do _2___ sets per session. Do __2__ sessions per day.   Strengthening: Isometric Internal Rotation   Using door frame for resistance, press palm of right hand into ball using light pressure. Keep elbow in at side. Hold _5___ seconds. Repeat __10__ times per set. Do _2___ sets per session. Do __2__ sessions per day.    

## 2016-11-16 NOTE — Therapy (Signed)
[facility] [place], [place], [address] Phone: [phone]   Fax:  [phone]  Physical Therapy Treatment  Patient Details  Name: [name] MRN: [id number] Date of Birth: 1960-12-22 Referring Provider: Remo Lipps Case MD  Encounter Date: 11/16/2016      PT End of Session - 11/16/16 0841    Visit Number 9   Number of Visits 16   Date for PT Re-Evaluation 12/05/16   PT Start Time 0815   PT Stop Time 0912   PT Time Calculation (min) 57 min   Activity Tolerance Patient tolerated treatment well   Behavior During Therapy [facility] for tasks assessed/performed      Past Medical History:  Diagnosis Date  . Anxiety   . Asthma   . Depression   . Hyperlipemia     Past Surgical History:  Procedure Laterality Date  . ABDOMINAL HYSTERECTOMY    . BACK SURGERY     pins  . BACK SURGERY  2010  . BLADDER REPAIR    . CESAREAN SECTION  1982, 1984  . COLONOSCOPY N/A 06/12/2013   Procedure: COLONOSCOPY;  Surgeon: Rogene Houston, MD;  [facility];  Service: Endoscopy;  Laterality: N/A;  930-moved to 1020 Ann to notify pt    There were no vitals filed for this visit.      Subjective Assessment - 11/16/16 0819    Subjective Patient reported doing good after last treatment and having ongoing soreness   Pertinent History asthma, back pain (2 surgeries 20 years ago),    Patient Stated Goals improve movement and decrease pain   Currently in Pain? Yes   Pain Score 6    Pain Location Shoulder   Pain Orientation Right   Pain Descriptors / Indicators Aching;Sore   Pain Type Surgical pain   Pain Onset 1 to 4 weeks ago   Pain Frequency Constant   Aggravating Factors  use and movement   Pain Relieving Factors at rest            [facility] PT Assessment - 11/16/16 0001      ROM / Strength   AROM / PROM / Strength AROM;PROM     AROM   AROM Assessment Site Shoulder   Right/Left Shoulder Right   Right Shoulder  External Rotation 80 Degrees     PROM   PROM Assessment Site Shoulder   Right/Left Shoulder Right   Right Shoulder Flexion 160 Degrees   Right Shoulder External Rotation 80 Degrees                     [facility] Adult PT Treatment/Exercise - 11/16/16 0001      Shoulder Exercises: Supine   Protraction AAROM;Both;20 reps  cane   External Rotation AAROM;Right;20 reps  cane   Flexion AAROM;Both;20 reps  cane     Shoulder Exercises: Standing   Retraction Strengthening;Both;20 reps   Other Standing Exercises 4 way shoulder isometrics 5sec x5each way     Shoulder Exercises: Pulleys   Flexion Other (comment)  6mn   ABduction Limitations UE ranger for elevation and circles 2x10 each way   Other Pulley Exercises wall ladder flex x 10     Electrical Stimulation   Electrical Stimulation Location R shoulder   Electrical Stimulation Action IFC   Electrical Stimulation Parameters 1-10hz x170m   Electrical Stimulation Goals Edema;Pain     Vasopneumatic   Number Minutes Vasopneumatic  15 minutes   Vasopnuematic Location  Shoulder  Vasopneumatic Pressure Medium     Manual Therapy   Manual Therapy Passive ROM   Passive ROM right shoulder ROM for flexion, IR, ER then rhythmic stabs for IR/ER in scaption and flex/ext at 90 degrees                PT Education - 11/16/16 0857    Education provided Yes   Education Details HEP   Person(s) Educated Patient   Methods Explanation;Demonstration;Handout   Comprehension Verbalized understanding;Returned demonstration          PT Short Term Goals - 10/12/16 0845      PT SHORT TERM GOAL #1   Title I with HEP (11/07/16)   Time 4   Period Weeks   Status Achieved     PT SHORT TERM GOAL #2   Title Decreased pain in R shoulder to 4/10.    Time 4   Period Weeks   Status On-going           PT Long Term Goals - 11/11/16 1119      PT LONG TERM GOAL #1   Title I with advanced HEP   Time 8   Period Weeks    Status On-going     PT LONG TERM GOAL #2   Title Patient to demonstrate R shoulder ROM to within functional limits to perform ADLS.   Time 8   Period Weeks   Status Partially Met     PT LONG TERM GOAL #3   Title Patient able to perform ADLS with 4-6/27 pain or less   Time 8   Period Weeks   Status On-going     PT LONG TERM GOAL #4   Title Patient to demo 4+/5 R shoulder strength to ease ADLS.   Time 8   Period Weeks   Status On-going     PT LONG TERM GOAL #5   Title Patient able to sleep in her bed without waking from shoulder pain.   Time 8   Period Weeks   Status Achieved               Plan - 11/16/16 0350    Clinical Impression Statement Patient tolerated treatment well today. Patient progressing with AAROM activities. Patient has full PROM. HEP given for isometrics. Patient has ongoing soreness in right shoulder. Educated patient on protocol/healing. Goals ongoing due to strength and pain deficts.   Rehab Potential Excellent   Clinical Impairments Affecting Rehab Potential surgery 09/23/16 and current 8 weeks as of 11/17/16   PT Frequency 2x / week   PT Duration 8 weeks   PT Treatment/Interventions ADLs/Self Care Home Management;Cryotherapy;Electrical Stimulation;Ultrasound;Therapeutic exercise;Therapeutic activities;Neuromuscular re-education;Manual techniques;Passive range of motion;Vasopneumatic Device   PT Next Visit Plan cont with POC per protocol; modalities for pain and edema.   Consulted and Agree with Plan of Care Patient      Patient will benefit from skilled therapeutic intervention in order to improve the following deficits and impairments:  Abnormal gait, Pain, Impaired UE functional use, Decreased range of motion, Postural dysfunction, Decreased strength  Visit Diagnosis: Stiffness of right shoulder, not elsewhere classified  Acute pain of right shoulder  Localized edema     Problem List Patient Active Problem List   Diagnosis Date Noted   . LOWER LEG, ARTHRITIS, DEGEN./OSTEO 02/18/2009  . DERANGEMENT MENISCUS 02/18/2009  . [name], RIGHT 02/18/2009    ,  P, PTA 11/16/2016, 9:15 AM  [facility] [place], [place],  [place] Phone: [phone]   Fax:  [phone]  Name: [name] MRN: [id number] Date of Birth: [birth year]

## 2016-11-22 ENCOUNTER — Ambulatory Visit: Payer: Medicare Other | Admitting: Physical Therapy

## 2016-11-22 DIAGNOSIS — M25611 Stiffness of right shoulder, not elsewhere classified: Secondary | ICD-10-CM

## 2016-11-22 NOTE — Therapy (Signed)
[facility] [place], [place], [address] Phone: [phone]   Fax:  [phone]  Physical Therapy Treatment  Patient Details  Name: [name] MRN: [id number] Date of Birth: May 13, 1961 Referring Provider: Remo Lipps Case MD  Encounter Date: 11/22/2016      PT End of Session - 11/22/16 0905    Visit Number 10   Number of Visits 16   Date for PT Re-Evaluation 12/05/16   PT Start Time 0905   PT Stop Time 0946   PT Time Calculation (min) 41 min   Activity Tolerance Patient tolerated treatment well   Behavior During Therapy [facility] for tasks assessed/performed      Past Medical History:  Diagnosis Date  . Anxiety   . Asthma   . Depression   . Hyperlipemia     Past Surgical History:  Procedure Laterality Date  . ABDOMINAL HYSTERECTOMY    . BACK SURGERY     pins  . BACK SURGERY  2010  . BLADDER REPAIR    . CESAREAN SECTION  1982, 1984  . COLONOSCOPY N/A 06/12/2013   Procedure: COLONOSCOPY;  Surgeon: Rogene Houston, MD;  [facility];  Service: Endoscopy;  Laterality: N/A;  930-moved to 1020 Ann to notify pt    There were no vitals filed for this visit.      Subjective Assessment - 11/22/16 0906    Subjective Patient reports no pain just soreness. She started working out at [employer].   Pertinent History asthma, back pain (2 surgeries 20 years ago),    Patient Stated Goals improve movement and decrease pain   Currently in Pain? No/denies            [facility] PT Assessment - 11/22/16 0001      AROM   AROM Assessment Site Shoulder   Right/Left Shoulder Right   Right Shoulder Flexion 153 Degrees  standing   Right Shoulder ABduction 135 Degrees  standing   Right Shoulder Internal Rotation 65 Degrees   Right Shoulder External Rotation 80 Degrees                     [facility] Adult PT Treatment/Exercise - 11/22/16 0001      Shoulder Exercises: Supine   Flexion AAROM;Right;10 reps  10  sec hold     Shoulder Exercises: Prone   Horizontal ABduction 1 Strengthening;Right;20 reps  bent elbow   Horizontal ABduction 2 Strengthening;Right;20 reps  Row     Shoulder Exercises: Sidelying   External Rotation Strengthening;Right;20 reps     Shoulder Exercises: Standing   External Rotation Strengthening;Both;20 reps  cues to keep shoulder down   Flexion Strengthening;Right;20 reps   Flexion Limitations scaption x 20   ABduction Strengthening;Right;20 reps   Other Standing Exercises elevation x 5 with slow return     Shoulder Exercises: Pulleys   Flexion Other (comment)  86mn   ABduction Limitations UE ranger for elevation and circles 2x10 each way   Other Pulley Exercises wall ladder flex x 10                  PT Short Term Goals - 11/22/16 06962     PT SHORT TERM GOAL #1   Title I with HEP (11/07/16)   Time 4   Period Weeks   Status Achieved     PT SHORT TERM GOAL #2   Title Decreased pain in R shoulder to 4/10.    Time 4  Period Weeks   Status Achieved           PT Long Term Goals - 12/10/16 0936      PT LONG TERM GOAL #1   Title I with advanced HEP   Time 8   Period Weeks   Status On-going     PT LONG TERM GOAL #2   Title Patient to demonstrate R shoulder ROM to within functional limits to perform ADLS.   Time 8   Period Weeks   Status Achieved     PT LONG TERM GOAL #3   Title Patient able to perform ADLS with 6-1/53 pain or less   Baseline 0-3/10 as of 2016/12/10   Time 8   Period Weeks   Status Achieved     PT LONG TERM GOAL #4   Title Patient to demo 4+/5 R shoulder strength to ease ADLS.   Time 8   Period Weeks   Status On-going     PT LONG TERM GOAL #5   Title Patient able to sleep in her bed without waking from shoulder pain.   Time 8   Period Weeks   Status Achieved               Plan - 10-Dec-2016 0954    Clinical Impression Statement Patient is doing very well overall. Her ROM is WFL and pain is minimal.  She tolerated strengthening TE today without pain and with good form. PT reminded patient of precautions with lifting and WBing and pt verbalized understanding. All LTGs are met except strength goal and HEP.   Rehab Potential Excellent   Clinical Impairments Affecting Rehab Potential surgery 09/23/16 and current 8 weeks as of 11/17/16   PT Frequency 2x / week   PT Duration 8 weeks   PT Treatment/Interventions ADLs/Self Care Home Management;Cryotherapy;Electrical Stimulation;Ultrasound;Therapeutic exercise;Therapeutic activities;Neuromuscular re-education;Manual techniques;Passive range of motion;Vasopneumatic Device   PT Next Visit Plan cont with POC per protocol; modalities for pain and edema.   PT Home Exercise Plan pendulum, thigh slides, ball squeeze   Consulted and Agree with Plan of Care Patient      Patient will benefit from skilled therapeutic intervention in order to improve the following deficits and impairments:  Abnormal gait, Pain, Impaired UE functional use, Decreased range of motion, Postural dysfunction, Decreased strength  Visit Diagnosis: Stiffness of right shoulder, not elsewhere classified       G-Codes - 12-10-2016 7943    Functional Assessment Tool Used (Outpatient Only) FOTO 28% LIMITED   Functional Limitation Other PT primary   Other PT Primary Current Status (E7614) At least 20 percent but less than 40 percent impaired, limited or restricted   Other PT Primary Goal Status (J0929) At least 40 percent but less than 60 percent impaired, limited or restricted      Problem List Patient Active Problem List   Diagnosis Date Noted  . LOWER LEG, ARTHRITIS, DEGEN./OSTEO 02/18/2009  . DERANGEMENT MENISCUS 02/18/2009  . [name], RIGHT 02/18/2009    Madelyn Flavors PT 12/10/2016, 9:59 AM  [facility] [address] [place], [place], [address] Phone: [phone]   Fax:  [phone]  Name: [name] MRN:  [id number] Date of Birth: [birth year]

## 2016-11-25 ENCOUNTER — Ambulatory Visit (INDEPENDENT_AMBULATORY_CARE_PROVIDER_SITE_OTHER): Payer: Medicare Other | Admitting: Obstetrics & Gynecology

## 2016-11-25 ENCOUNTER — Encounter: Payer: Self-pay | Admitting: Obstetrics & Gynecology

## 2016-11-25 VITALS — BP 110/70 | HR 80 | Wt 214.0 lb

## 2016-11-25 DIAGNOSIS — Z01419 Encounter for gynecological examination (general) (routine) without abnormal findings: Secondary | ICD-10-CM | POA: Diagnosis not present

## 2016-11-25 DIAGNOSIS — Z1212 Encounter for screening for malignant neoplasm of rectum: Secondary | ICD-10-CM | POA: Diagnosis not present

## 2016-11-25 DIAGNOSIS — Z1211 Encounter for screening for malignant neoplasm of colon: Secondary | ICD-10-CM

## 2016-11-25 MED ORDER — ESTRADIOL 2 MG PO TABS: 2.0000 mg | ORAL_TABLET | Freq: Every day | ORAL | 11 refills | Status: DC

## 2016-11-25 NOTE — Progress Notes (Signed)
Subjective:     [name] is a 56 y.o. female here for a routine exam.  No LMP recorded. Patient has had a hysterectomy. G2P2 Birth Control Method:  hysterectomy Menstrual Calendar(currently): amenorrheic hysterectomy  Current complaints: night sweats.   Current acute medical issues:  none   Recent Gynecologic History No LMP recorded. Patient has had a hysterectomy. Last Pap: years ago,   Last mammogram: 2/262018,  normal  Past Medical History:  Diagnosis Date  . Anxiety   . Asthma   . Depression   . Hyperlipemia     Past Surgical History:  Procedure Laterality Date  . ABDOMINAL HYSTERECTOMY    . BACK SURGERY     pins  . BACK SURGERY  2010  . BLADDER REPAIR    . CESAREAN SECTION  1982, 1984  . COLONOSCOPY N/A 06/12/2013   Procedure: COLONOSCOPY;  Surgeon: Rogene Houston, MD;  [facility];  Service: Endoscopy;  Laterality: N/A;  930-moved to 1020 Ann to notify pt    OB History    Gravida Para Term Preterm AB Living   2         2   SAB TAB Ectopic Multiple Live Births                  Social History   Social History  . Marital status: Divorced    Spouse name: N/A  . Number of children: N/A  . Years of education: N/A   Social History Main Topics  . Smoking status: Former Smoker    Packs/day: 12.00    Years: 20.00    Types: Cigarettes  . Smokeless tobacco: Never Used  . Alcohol use No  . Drug use: No  . Sexual activity: Not Currently    Birth control/ protection: Surgical   Other Topics Concern  . None   Social History Narrative  . None    Family History  Problem Relation Age of Onset  . Colon cancer Other      Current Outpatient Prescriptions:  .  amphetamine-dextroamphetamine (ADDERALL) 20 MG tablet, Take 10 mg by mouth 2 (two) times daily., Disp: , Rfl:  .  busPIRone (BUSPAR) 5 MG tablet, Take 5 mg by mouth 3 (three) times daily., Disp: , Rfl:  .  busulfan (MYLERAN) 2 MG tablet, Take 2 mg by mouth 3 (three) times daily.,  Disp: , Rfl:  .  cetirizine (ZYRTEC) 10 MG tablet, Take 10 mg by mouth daily., Disp: , Rfl:  .  estradiol (ESTRACE) 2 MG tablet, Take 1 tablet (2 mg total) by mouth at bedtime., Disp: 30 tablet, Rfl: 11 .  hydrOXYzine (ATARAX/VISTARIL) 10 MG tablet, Take 10 mg by mouth 2 (two) times daily as needed. Reported on 09/29/2015, Disp: , Rfl:  .  naloxegol oxalate (MOVANTIK) 25 MG TABS tablet, Take by mouth daily., Disp: , Rfl:  .  naproxen (NAPRELAN) 500 MG 24 hr tablet, Take 500 mg by mouth daily with breakfast., Disp: , Rfl:  .  oxycodone (OXY-IR) 5 MG capsule, Take 5 mg by mouth every 4 (four) hours as needed., Disp: , Rfl:  .  pantoprazole (PROTONIX) 40 MG tablet, Take 40 mg by mouth daily as needed (for acid reflux). , Disp: , Rfl:  .  pravastatin (PRAVACHOL) 40 MG tablet, Take 40 mg by mouth at bedtime. , Disp: , Rfl:  .  traZODone (DESYREL) 100 MG tablet, Take 150 mg by mouth at bedtime., Disp: , Rfl:  .  DULoxetine (  CYMBALTA) 30 MG capsule, Take 30 mg by mouth daily. Takes along with 60 mg tab to equal 90 mg., Disp: , Rfl:  .  ipratropium (ATROVENT) 0.02 % nebulizer solution, Take 2.5 mLs (0.5 mg total) by nebulization every 4 (four) hours as needed., Disp: 75 mL, Rfl: 2  Review of Systems  Review of Systems  Constitutional: Negative for fever, chills, weight loss, malaise/fatigue and diaphoresis.  HENT: Negative for hearing loss, ear pain, nosebleeds, congestion, sore throat, neck pain, tinnitus and ear discharge.   Eyes: Negative for blurred vision, double vision, photophobia, pain, discharge and redness.  Respiratory: Negative for cough, hemoptysis, sputum production, shortness of breath, wheezing and stridor.   Cardiovascular: Negative for chest pain, palpitations, orthopnea, claudication, leg swelling and PND.  Gastrointestinal: negative for abdominal pain. Negative for heartburn, nausea, vomiting, diarrhea, constipation, blood in stool and melena.  Genitourinary: Negative for dysuria,  urgency, frequency, hematuria and flank pain.  Musculoskeletal: Negative for myalgias, back pain, joint pain and falls.  Skin: Negative for itching and rash.  Neurological: Negative for dizziness, tingling, tremors, sensory change, speech change, focal weakness, seizures, loss of consciousness, weakness and headaches.  Endo/Heme/Allergies: Negative for environmental allergies and polydipsia. Does not bruise/bleed easily.  Psychiatric/Behavioral: Negative for depression, suicidal ideas, hallucinations, memory loss and substance abuse. The patient is not nervous/anxious and does not have insomnia.        Objective:  Blood pressure 110/70, pulse 80, weight 214 lb (97.1 kg).   Physical Exam  Vitals reviewed. Constitutional: She is oriented to person, place, and time. She appears well-developed and well-nourished.  HENT:  Head: Normocephalic and atraumatic.        Right Ear: External ear normal.  Left Ear: External ear normal.  Nose: Nose normal.  Mouth/Throat: Oropharynx is clear and moist.  Eyes: Conjunctivae and EOM are normal. Pupils are equal, round, and reactive to light. Right eye exhibits no discharge. Left eye exhibits no discharge. No scleral icterus.  Neck: Normal range of motion. Neck supple. No tracheal deviation present. No thyromegaly present.  Cardiovascular: Normal rate, regular rhythm, normal heart sounds and intact distal pulses.  Exam reveals no gallop and no friction rub.   No murmur heard. Respiratory: Effort normal and breath sounds normal. No respiratory distress. She has no wheezes. She has no rales. She exhibits no tenderness.  GI: Soft. Bowel sounds are normal. She exhibits no distension and no mass. There is no tenderness. There is no rebound and no guarding.  Genitourinary:  Breasts no masses skin changes or nipple changes bilaterally      Vulva is normal without lesions Vagina is pink moist without discharge Cervix absent Uterus is absent Adnexa is negative   {Rectal    hemoccult negative, normal tone, no masses  Musculoskeletal: Normal range of motion. She exhibits no edema and no tenderness.  Neurological: She is alert and oriented to person, place, and time. She has normal reflexes. She displays normal reflexes. No cranial nerve deficit. She exhibits normal muscle tone. Coordination normal.  Skin: Skin is warm and dry. No rash noted. No erythema. No pallor.  Psychiatric: She has a normal mood and affect. Her behavior is normal. Judgment and thought content normal.       Medications Ordered at today's visit: Meds ordered this encounter  Medications  . busPIRone (BUSPAR) 5 MG tablet    Sig: Take 5 mg by mouth 3 (three) times daily.  . naproxen (NAPRELAN) 500 MG 24 hr tablet    Sig:  Take 500 mg by mouth daily with breakfast.  . naloxegol oxalate (MOVANTIK) 25 MG TABS tablet    Sig: Take by mouth daily.  Marland Kitchen estradiol (ESTRACE) 2 MG tablet    Sig: Take 1 tablet (2 mg total) by mouth at bedtime.    Dispense:  30 tablet    Refill:  11    Other orders placed at today's visit: No orders of the defined types were placed in this encounter.     Assessment:    Healthy female exam.    Plan:    Contraception: status post hysterectomy. Mammogram ordered. Follow up in: 1 year.    Continue estradiol    Return in about 1 year (around 11/25/2017) for yearly, with Dr Elonda Husky.

## 2016-11-29 ENCOUNTER — Ambulatory Visit: Payer: Medicare Other | Admitting: *Deleted

## 2016-11-29 DIAGNOSIS — M25611 Stiffness of right shoulder, not elsewhere classified: Secondary | ICD-10-CM

## 2016-11-29 DIAGNOSIS — M25511 Pain in right shoulder: Secondary | ICD-10-CM

## 2016-11-29 DIAGNOSIS — R600 Localized edema: Secondary | ICD-10-CM

## 2016-11-29 NOTE — Therapy (Addendum)
[facility] [place], [place], [address] Phone: [phone]   Fax:  [phone]  Physical Therapy Treatment  Patient Details  Name: [name] MRN: [id number] Date of Birth: Dec 24, 1960 Referring Provider: Remo Lipps Case MD  Encounter Date: 11/29/2016      PT End of Session - 11/29/16 0841    Visit Number 11   Number of Visits 16   Date for PT Re-Evaluation 12/05/16   PT Start Time 0815   PT Stop Time 0901   PT Time Calculation (min) 46 min      Past Medical History:  Diagnosis Date  . Anxiety   . Asthma   . Depression   . Hyperlipemia     Past Surgical History:  Procedure Laterality Date  . ABDOMINAL HYSTERECTOMY    . BACK SURGERY     pins  . BACK SURGERY  2010  . BLADDER REPAIR    . CESAREAN SECTION  1982, 1984  . COLONOSCOPY N/A 06/12/2013   Procedure: COLONOSCOPY;  Surgeon: Rogene Houston, MD;  [facility];  Service: Endoscopy;  Laterality: N/A;  930-moved to 1020 Ann to notify pt    There were no vitals filed for this visit.      Subjective Assessment - 11/29/16 0841    Subjective Patient reports no pain just soreness. She started working out at [employer].   Pertinent History asthma, back pain (2 surgeries 20 years ago),    Patient Stated Goals improve movement and decrease pain   Currently in Pain? No/denies                         [facility] Adult PT Treatment/Exercise - 11/29/16 0001      Exercises   Exercises Shoulder     Shoulder Exercises: Prone   Extension 20 reps   Horizontal ABduction 1 Strengthening;Right;20 reps  bent elbow     Shoulder Exercises: Sidelying   External Rotation Strengthening;Right;20 reps   Flexion AROM;20 reps;10 reps     Shoulder Exercises: Standing   Flexion Strengthening;Right;20 reps;10 reps   Other Standing Exercises 4 way shoulder isometrics 10sec x5each way   Other Standing Exercises elevation  3 x 10  with slow return      Shoulder Exercises: Pulleys   Flexion Other (comment)  67mn   ABduction Limitations UE ranger for elevation and circles x 5 mins   Other Pulley Exercises wall ladder flex x 10     Modalities   Modalities --                  PT Short Term Goals - 11/22/16 04627     PT SHORT TERM GOAL #1   Title I with HEP (11/07/16)   Time 4   Period Weeks   Status Achieved     PT SHORT TERM GOAL #2   Title Decreased pain in R shoulder to 4/10.    Time 4   Period Weeks   Status Achieved           PT Long Term Goals - 11/22/16 0936      PT LONG TERM GOAL #1   Title I with advanced HEP   Time 8   Period Weeks   Status On-going     PT LONG TERM GOAL #2   Title Patient to demonstrate R shoulder ROM to within functional limits to perform ADLS.   Time 8   Period  Weeks   Status Achieved     PT LONG TERM GOAL #3   Title Patient able to perform ADLS with 7-8/29 pain or less   Baseline 0-3/10 as of 11/22/16   Time 8   Period Weeks   Status Achieved     PT LONG TERM GOAL #4   Title Patient to demo 4+/5 R shoulder strength to ease ADLS.   Time 8   Period Weeks   Status On-going     PT LONG TERM GOAL #5   Title Patient able to sleep in her bed without waking from shoulder pain.   Time 8   Period Weeks   Status Achieved               Plan - 11/29/16 5621    Clinical Impression Statement Pty continues to improve with AROM and AAROM exs as well as isometric strengthening. She was able to perform all therex with mainly fatigue. Her ROM continues to be WFLs AROM: Flexion 165 degrees, ER 65, and IR HBB to T12 . All LTGs Met except strength  due to weakness.Strength 4-/5   Clinical Impairments Affecting Rehab Potential surgery 09/23/16 and current 9 weeks as of 11/24/16   PT Frequency 2x / week   PT Duration 8 weeks   PT Treatment/Interventions ADLs/Self Care Home Management;Cryotherapy;Electrical Stimulation;Ultrasound;Therapeutic exercise;Therapeutic  activities;Neuromuscular re-education;Manual techniques;Passive range of motion;Vasopneumatic Device   PT Next Visit Plan cont with POC per protocol; modalities for pain and edema.   Send to MD Pt will be OOT until MD F/U   PT Home Exercise Plan pendulum, thigh slides, ball squeeze   Consulted and Agree with Plan of Care Patient      Patient will benefit from skilled therapeutic intervention in order to improve the following deficits and impairments:     Visit Diagnosis: Stiffness of right shoulder, not elsewhere classified  Acute pain of right shoulder  Localized edema     Problem List Patient Active Problem List   Diagnosis Date Noted  . LOWER LEG, ARTHRITIS, DEGEN./OSTEO 02/18/2009  . DERANGEMENT MENISCUS 02/18/2009  . ANSERINE BURSITIS, RIGHT 02/18/2009    APPLEGATE, Mal[place], PTA 11/29/2016, 5:31 PM Mali Applegate MPT [facility] [address] [place], [place], [address] Phone: [phone]   Fax:  [phone]  Name: [name] MRN: [id number] Date of Birth: 05/21/1961  PHYSICAL THERAPY DISCHARGE SUMMARY  Visits from Start of Care: 11.  Current functional level related to goals / functional outcomes: See above.   Remaining deficits: LTG's #2 and #4 met.   Education / Equipment: HEP. Plan: Patient agrees to discharge.  Patient goals were partially met. Patient is being discharged due to being pleased with the current functional level.  ?????         Mali Applegate MPT

## 2017-12-20 ENCOUNTER — Other Ambulatory Visit: Payer: Self-pay | Admitting: Obstetrics & Gynecology

## 2018-03-28 ENCOUNTER — Encounter (INDEPENDENT_AMBULATORY_CARE_PROVIDER_SITE_OTHER): Payer: Self-pay | Admitting: Internal Medicine

## 2018-03-28 ENCOUNTER — Ambulatory Visit (INDEPENDENT_AMBULATORY_CARE_PROVIDER_SITE_OTHER): Payer: Medicare Other | Admitting: Internal Medicine

## 2018-03-28 VITALS — BP 126/78 | HR 76 | Temp 98.1°F | Ht 64.0 in | Wt 224.8 lb

## 2018-03-28 DIAGNOSIS — R197 Diarrhea, unspecified: Secondary | ICD-10-CM | POA: Diagnosis not present

## 2018-03-28 MED ORDER — DICYCLOMINE HCL 10 MG PO CAPS: 10.0000 mg | ORAL_CAPSULE | Freq: Three times a day (TID) | ORAL | 3 refills | Status: DC

## 2018-03-28 NOTE — Patient Instructions (Addendum)
GI pathogen. Rx for Dicyclomine sent to her pharmacy

## 2018-03-28 NOTE — Progress Notes (Signed)
Subjective:    Patient ID: [name], female    DOB: 09/23/1960, 57 y.o.   MRN: [id number]  HPI  Referred by Dr. Cindie Laroche for chronic diarrhea. She has had diarrhea x 4 months. Given an Rx for ? Cipro and Flagyl which did not help. She also tried Vitamin D which did not help. She istaking Metamucil BID. She was told to stop all dairy. She is having 3 BMs a day. Stools are very loose. Some are watery and some are mushy. Her appetite is okay. She says when she eats to has to run to the BR.    Takes Oxycodone 5mg  TID for chronic back pain.  Her last colonoscopy was in 2014 (average risk) Few small diverticula at ascending colon otherwise normal colonoscopy. Normal mucosa of terminal ileum.  Review of Systems   Past Medical History:  Diagnosis Date  . Anxiety   . Asthma   . Depression   . Hyperlipemia     Past Surgical History:  Procedure Laterality Date  . ABDOMINAL HYSTERECTOMY    . BACK SURGERY     pins  . BACK SURGERY  2010  . BLADDER REPAIR    . CESAREAN SECTION  1982, 1984  . COLONOSCOPY N/A 06/12/2013   Procedure: COLONOSCOPY;  Surgeon: Rogene Houston, MD;  [facility];  Service: Endoscopy;  Laterality: N/A;  930-moved to 1020 Ann to notify pt    No Known Allergies  Current Outpatient Medications on File Prior to Visit  Medication Sig Dispense Refill  . amphetamine-dextroamphetamine (ADDERALL) 20 MG tablet Take 10 mg by mouth 2 (two) times daily.    . busPIRone (BUSPAR) 5 MG tablet Take 5 mg by mouth 3 (three) times daily.    . cetirizine (ZYRTEC) 10 MG tablet Take 10 mg by mouth as needed.     . Cholecalciferol (VITAMIN D3) 1000 units CAPS Take 5,000 Units by mouth.    . estradiol (ESTRACE) 2 MG tablet TAKE ONE TABLET AT BEDTIME 30 tablet 11  . magnesium gluconate (MAGONATE) 500 MG tablet Take 500 mg by mouth 2 (two) times daily.    . naproxen (NAPRELAN) 500 MG 24 hr tablet Take 500 mg by mouth daily with breakfast.    . oxycodone (OXY-IR) 5 MG  capsule Take 5 mg by mouth 3 (three) times daily.     . pravastatin (PRAVACHOL) 40 MG tablet Take 40 mg by mouth at bedtime.     . traZODone (DESYREL) 100 MG tablet Take 150 mg by mouth at bedtime.    . DULoxetine (CYMBALTA) 30 MG capsule Take 40 mg by mouth daily. Takes along with 60 mg tab to equal 90 mg.    . ipratropium (ATROVENT) 0.02 % nebulizer solution Take 2.5 mLs (0.5 mg total) by nebulization every 4 (four) hours as needed. 75 mL 2   No current facility-administered medications on file prior to visit.         Objective:   Physical Exam Blood pressure 126/78, pulse 76, temperature 98.1 F (36.7 C), height 5\' 4"  (1.626 m), weight 224 lb 12.8 oz (102 kg). Alert and oriented. Skin warm and dry. Oral mucosa is moist.   . Sclera anicteric, conjunctivae is pink. Thyroid not enlarged. No cervical lymphadenopathy. Lungs clear. Heart regular rate and rhythm.  Abdomen is soft. Bowel sounds are positive. No hepatomegaly. No abdominal masses felt. No tenderness.  No edema to lower extremities.           Assessment &  Plan:  Diarrhea. Will get GI pathogen.  Dicyclomine 10mg  TID after she turns in stool sample.

## 2018-05-01 ENCOUNTER — Telehealth (INDEPENDENT_AMBULATORY_CARE_PROVIDER_SITE_OTHER): Payer: Self-pay | Admitting: Internal Medicine

## 2018-05-01 NOTE — Telephone Encounter (Signed)
She can try Imodium in am and pm

## 2018-05-01 NOTE — Telephone Encounter (Signed)
Patient left message stating she is still having abd issues - please call her back at [phone]

## 2018-07-07 LAB — GASTROINTESTINAL PATHOGEN PANEL PCR
C. difficile Tox A/B, PCR: NOT DETECTED
Campylobacter, PCR: NOT DETECTED
Cryptosporidium, PCR: NOT DETECTED
E COLI (ETEC) LT/ST, PCR: NOT DETECTED
E coli (STEC) stx1/stx2, PCR: NOT DETECTED
E coli 0157, PCR: NOT DETECTED
Giardia lamblia, PCR: NOT DETECTED
Norovirus, PCR: NOT DETECTED
Rotavirus A, PCR: NOT DETECTED
Salmonella, PCR: NOT DETECTED
Shigella, PCR: NOT DETECTED

## 2018-12-10 ENCOUNTER — Other Ambulatory Visit (INDEPENDENT_AMBULATORY_CARE_PROVIDER_SITE_OTHER): Payer: Self-pay | Admitting: Internal Medicine

## 2018-12-10 DIAGNOSIS — R197 Diarrhea, unspecified: Secondary | ICD-10-CM

## 2018-12-27 ENCOUNTER — Ambulatory Visit (HOSPITAL_COMMUNITY)
Admission: RE | Admit: 2018-12-27 | Discharge: 2018-12-27 | Disposition: A | Discharge status: Home / Self Care | Payer: Medicare Other | Source: Ambulatory Visit | Attending: Family Medicine | Admitting: Family Medicine

## 2018-12-27 ENCOUNTER — Other Ambulatory Visit (HOSPITAL_COMMUNITY): Payer: Self-pay | Admitting: Family Medicine

## 2018-12-27 ENCOUNTER — Other Ambulatory Visit: Payer: Self-pay

## 2018-12-27 DIAGNOSIS — M25562 Pain in left knee: Secondary | ICD-10-CM | POA: Insufficient documentation

## 2018-12-27 DIAGNOSIS — M25561 Pain in right knee: Secondary | ICD-10-CM | POA: Diagnosis not present

## 2019-01-09 ENCOUNTER — Other Ambulatory Visit: Payer: Self-pay | Admitting: Obstetrics & Gynecology

## 2019-02-21 ENCOUNTER — Other Ambulatory Visit: Payer: Self-pay

## 2019-02-21 ENCOUNTER — Encounter: Payer: Self-pay | Admitting: Orthopaedic Surgery

## 2019-02-21 ENCOUNTER — Ambulatory Visit (INDEPENDENT_AMBULATORY_CARE_PROVIDER_SITE_OTHER): Payer: Medicare Other | Admitting: Orthopaedic Surgery

## 2019-02-21 VITALS — BP 114/61 | HR 66 | Temp 98.1°F | Ht 64.0 in | Wt 233.0 lb

## 2019-02-21 DIAGNOSIS — G8929 Other chronic pain: Secondary | ICD-10-CM | POA: Diagnosis not present

## 2019-02-21 DIAGNOSIS — M25562 Pain in left knee: Secondary | ICD-10-CM

## 2019-02-21 DIAGNOSIS — M25561 Pain in right knee: Secondary | ICD-10-CM | POA: Diagnosis not present

## 2019-02-21 DIAGNOSIS — F17210 Nicotine dependence, cigarettes, uncomplicated: Secondary | ICD-10-CM

## 2019-02-21 MED ORDER — NAPROXEN 500 MG PO TABS: 500.0000 mg | ORAL_TABLET | Freq: Two times a day (BID) | ORAL | 5 refills | Status: DC

## 2019-02-21 NOTE — Progress Notes (Signed)
Subjective:    Patient ID: [name], female    DOB: 1961/01/22, 58 y.o.   MRN: [id number]  HPI She has bilateral knee pain for many months. She has swelling, popping but no giving way, no redness, no numbness, no trauma.  She has been to the ER and had x-rays of both knees which showed moderated degenerative joint disease on 12-27-2018.  She has seen her family doctor, Dr. Cindie Laroche.  She is taking Tylenol for pain, used heat and ice.  They are not better. I have reviewed the notes from Dr. Cindie Laroche.  I have reviewed the x-rays and reports.   Review of Systems  Constitutional: Positive for activity change.  Respiratory: Positive for shortness of breath.   Musculoskeletal: Positive for arthralgias, gait problem and joint swelling.  Psychiatric/Behavioral: The patient is nervous/anxious.   All other systems reviewed and are negative.  For Review of Systems, all other systems reviewed and are negative.  The following is a summary of the past history medically, past history surgically, known current medicines, social history and family history.  This information is gathered electronically by the computer from prior information and documentation.  I review this each visit and have found including this information at this point in the chart is beneficial and informative.   Past Medical History:  Diagnosis Date   Anxiety    Asthma    Depression    Hyperlipemia     Past Surgical History:  Procedure Laterality Date   ABDOMINAL HYSTERECTOMY     BACK SURGERY     pins   BACK SURGERY  2010   BLADDER REPAIR     CESAREAN SECTION  1982, 1984   COLONOSCOPY N/A 06/12/2013   Procedure: COLONOSCOPY;  Surgeon: Rogene Houston, MD;  [facility];  Service: Endoscopy;  Laterality: N/A;  930-moved to 1020 Ann to notify pt    Current Outpatient Medications on File Prior to Visit  Medication Sig Dispense Refill   amphetamine-dextroamphetamine (ADDERALL) 20 MG tablet Take  10 mg by mouth 2 (two) times daily.     busPIRone (BUSPAR) 5 MG tablet Take 5 mg by mouth 3 (three) times daily.     Cholecalciferol (VITAMIN D3) 1000 units CAPS Take 5,000 Units by mouth.     estradiol (ESTRACE) 2 MG tablet TAKE ONE TABLET AT BEDTIME 30 tablet 11   loratadine (CLARITIN) 10 MG tablet Take 10 mg by mouth daily.     magnesium gluconate (MAGONATE) 500 MG tablet Take 500 mg by mouth 2 (two) times daily.     naproxen (NAPRELAN) 500 MG 24 hr tablet Take 500 mg by mouth daily with breakfast.     oxycodone (OXY-IR) 5 MG capsule Take 5 mg by mouth 3 (three) times daily.      pravastatin (PRAVACHOL) 40 MG tablet Take 40 mg by mouth at bedtime.      traZODone (DESYREL) 100 MG tablet Take 150 mg by mouth at bedtime.     cetirizine (ZYRTEC) 10 MG tablet Take 10 mg by mouth as needed.      dicyclomine (BENTYL) 10 MG capsule TAKE  (1)  CAPSULE THREE TIMES DAILY BEFORE MEALS.  (TAKE AT [place]) (Patient not taking: Reported on 02/21/2019) 90 capsule 2   DULoxetine (CYMBALTA) 30 MG capsule Take 40 mg by mouth daily. Takes along with 60 mg tab to equal 90 mg.     ipratropium (ATROVENT) 0.02 % nebulizer solution Take 2.5 mLs (  0.5 mg total) by nebulization every 4 (four) hours as needed. 75 mL 2   No current facility-administered medications on file prior to visit.     Social History   Socioeconomic History   Marital status: Divorced    Spouse name: Not on file   Number of children: Not on file   Years of education: Not on file   Highest education level: Not on file  Occupational History   Not on file  Social Needs   Financial resource strain: Not on file   Food insecurity    Worry: Not on file    Inability: Not on file   Transportation needs    Medical: Not on file    Non-medical: Not on file  Tobacco Use   Smoking status: Current Every Day Smoker    Packs/day: 12.00    Years: 20.00    Pack years: 240.00    Types: Cigarettes    Smokeless tobacco: Never Used  Substance and Sexual Activity   Alcohol use: No    Alcohol/week: 0.0 standard drinks   Drug use: No   Sexual activity: Not Currently    Birth control/protection: Surgical  Lifestyle   Physical activity    Days per week: Not on file    Minutes per session: Not on file   Stress: Not on file  Relationships   Social connections    Talks on phone: Not on file    Gets together: Not on file    Attends religious service: Not on file    Active member of club or organization: Not on file    Attends meetings of clubs or organizations: Not on file    Relationship status: Not on file   Intimate partner violence    Fear of current or ex partner: Not on file    Emotionally abused: Not on file    Physically abused: Not on file    Forced sexual activity: Not on file  Other Topics Concern   Not on file  Social History Narrative   Not on file    Family History  Problem Relation Age of Onset   Colon cancer Other     BP 114/61    Pulse 66    Temp 98.1 F (36.7 C)    Ht 5\' 4"  (1.626 m)    Wt 233 lb (105.7 kg)    BMI 39.99 kg/m   Body mass index is 39.99 kg/m.     Objective:   Physical Exam Vitals signs reviewed.  Constitutional:      Appearance: She is well-developed.  HENT:     Head: Normocephalic and atraumatic.  Eyes:     Conjunctiva/sclera: Conjunctivae normal.     Pupils: Pupils are equal, round, and reactive to light.  Neck:     Musculoskeletal: Normal range of motion and neck supple.  Cardiovascular:     Rate and Rhythm: Normal rate and regular rhythm.  Pulmonary:     Effort: Pulmonary effort is normal.  Abdominal:     Palpations: Abdomen is soft.  Musculoskeletal:     Right knee: She exhibits decreased range of motion and swelling. Tenderness found. Medial joint line tenderness noted.     Left knee: She exhibits decreased range of motion and swelling. Tenderness found. Medial joint line tenderness noted.        Legs:  Skin:    General: Skin is warm and dry.  Neurological:     Mental Status: She is alert and oriented to  person, place, and time.     Cranial Nerves: No cranial nerve deficit.     Motor: No abnormal muscle tone.     Coordination: Coordination normal.     Deep Tendon Reflexes: Reflexes are normal and symmetric. Reflexes normal.  Psychiatric:        Behavior: Behavior normal.        Thought Content: Thought content normal.        Judgment: Judgment normal.           Assessment & Plan:   Encounter Diagnoses  Name Primary?   Bilateral chronic knee pain Yes   Nicotine dependence, cigarettes, uncomplicated    PROCEDURE NOTE:  The patient requests injections of the left knee , verbal consent was obtained.  The left knee was prepped appropriately after time out was performed.   Sterile technique was observed and injection of 1 cc of Depo-Medrol 40 mg with several cc's of plain xylocaine. Anesthesia was provided by ethyl chloride and a 20-gauge needle was used to inject the knee area. The injection was tolerated well.  A band aid dressing was applied.  The patient was advised to apply ice later today and tomorrow to the injection sight as needed.  PROCEDURE NOTE:  The patient requests injections of the right knee , verbal consent was obtained.  The right knee was prepped appropriately after time out was performed.   Sterile technique was observed and injection of 1 cc of Depo-Medrol 40 mg with several cc's of plain xylocaine. Anesthesia was provided by ethyl chloride and a 20-gauge needle was used to inject the knee area. The injection was tolerated well.  A band aid dressing was applied.  The patient was advised to apply ice later today and tomorrow to the injection sight as needed.  I will begin Naprosyn 500 bid pc po.  Return in one month.  She may need MRIs.  Call if any problem.  Precautions discussed.   Electronically Signed Sanjuana Kava,  MD 8/13/202010:30 AM

## 2019-02-22 ENCOUNTER — Telehealth: Payer: Self-pay | Admitting: Orthopaedic Surgery

## 2019-02-22 NOTE — Telephone Encounter (Signed)
Patient states she has been taking Naproxen for years for her back and this has not been helping that much.  She said she forgot to tell you this when she saw you.  She wants to know if there is anything else that you can give her besides the Naproxen?  She uses [facility]

## 2019-02-26 MED ORDER — DICLOFENAC SODIUM 75 MG PO TBEC: 75.0000 mg | DELAYED_RELEASE_TABLET | Freq: Two times a day (BID) | ORAL | 2 refills | Status: DC

## 2019-03-21 ENCOUNTER — Ambulatory Visit (INDEPENDENT_AMBULATORY_CARE_PROVIDER_SITE_OTHER): Payer: Medicare Other | Admitting: Orthopaedic Surgery

## 2019-03-21 ENCOUNTER — Encounter: Payer: Self-pay | Admitting: Orthopaedic Surgery

## 2019-03-21 ENCOUNTER — Other Ambulatory Visit: Payer: Self-pay

## 2019-03-21 VITALS — BP 141/99 | HR 74 | Temp 97.0°F | Ht 64.0 in | Wt 231.5 lb

## 2019-03-21 DIAGNOSIS — F17210 Nicotine dependence, cigarettes, uncomplicated: Secondary | ICD-10-CM

## 2019-03-21 DIAGNOSIS — G8929 Other chronic pain: Secondary | ICD-10-CM

## 2019-03-21 DIAGNOSIS — M25561 Pain in right knee: Secondary | ICD-10-CM | POA: Diagnosis not present

## 2019-03-21 DIAGNOSIS — M25562 Pain in left knee: Secondary | ICD-10-CM

## 2019-03-21 NOTE — Progress Notes (Signed)
Patient [id number] [name], female DOB:Sep 07, 1960, 58 y.o. [id number]  Chief Complaint  Patient presents with  . Knee Pain    Bilat/hurt some last night and is hurting now.    HPI  [name] is a 58 y.o. female who has continued pain of both knees, more on the right.  The injection did not help.  She is taking the Naprosyn with little help. She has swelling, popping and giving way.  I will get a MRI as I am concerned about a meniscus tear.   Body mass index is 39.74 kg/m.  ROS  Review of Systems  Constitutional: Positive for activity change.  Respiratory: Positive for shortness of breath.   Musculoskeletal: Positive for arthralgias, gait problem and joint swelling.  Psychiatric/Behavioral: The patient is nervous/anxious.   All other systems reviewed and are negative.   All other systems reviewed and are negative.  The following is a summary of the past history medically, past history surgically, known current medicines, social history and family history.  This information is gathered electronically by the computer from prior information and documentation.  I review this each visit and have found including this information at this point in the chart is beneficial and informative.    Past Medical History:  Diagnosis Date  . Anxiety   . Asthma   . Depression   . Hyperlipemia     Past Surgical History:  Procedure Laterality Date  . ABDOMINAL HYSTERECTOMY    . BACK SURGERY     pins  . BACK SURGERY  2010  . BLADDER REPAIR    . CESAREAN SECTION  1982, 1984  . COLONOSCOPY N/A 06/12/2013   Procedure: COLONOSCOPY;  Surgeon: Rogene Houston, MD;  Location: [facility];  Service: Endoscopy;  Laterality: N/A;  930-moved to 1020 Ann to notify pt    Family History  Problem Relation Age of Onset  . Colon cancer Other     Social History Social History   Tobacco Use  . Smoking status: Current Every Day Smoker    Packs/day: 12.00    Years: 20.00    Pack  years: 240.00    Types: Cigarettes  . Smokeless tobacco: Never Used  Substance Use Topics  . Alcohol use: No    Alcohol/week: 0.0 standard drinks  . Drug use: No    No Known Allergies  Current Outpatient Medications  Medication Sig Dispense Refill  . amphetamine-dextroamphetamine (ADDERALL) 20 MG tablet Take 10 mg by mouth 2 (two) times daily.    . busPIRone (BUSPAR) 5 MG tablet Take 5 mg by mouth 3 (three) times daily.    . cetirizine (ZYRTEC) 10 MG tablet Take 10 mg by mouth as needed.     . Cholecalciferol (VITAMIN D3) 1000 units CAPS Take 5,000 Units by mouth.    . diclofenac (VOLTAREN) 75 MG EC tablet Take 1 tablet (75 mg total) by mouth 2 (two) times daily with a meal. 60 tablet 2  . dicyclomine (BENTYL) 10 MG capsule TAKE  (1)  CAPSULE THREE TIMES DAILY BEFORE MEALS.  (TAKE AT [facility]) (Patient not taking: Reported on 02/21/2019) 90 capsule 2  . DULoxetine (CYMBALTA) 30 MG capsule Take 40 mg by mouth daily. Takes along with 60 mg tab to equal 90 mg.    . estradiol (ESTRACE) 2 MG tablet TAKE ONE TABLET AT BEDTIME 30 tablet 11  . ipratropium (ATROVENT) 0.02 % nebulizer solution Take 2.5 mLs (0.5 mg total) by nebulization every  4 (four) hours as needed. 75 mL 2  . loratadine (CLARITIN) 10 MG tablet Take 10 mg by mouth daily.    . magnesium gluconate (MAGONATE) 500 MG tablet Take 500 mg by mouth 2 (two) times daily.    Marland Kitchen oxycodone (OXY-IR) 5 MG capsule Take 5 mg by mouth 3 (three) times daily.     . pravastatin (PRAVACHOL) 40 MG tablet Take 40 mg by mouth at bedtime.     . traZODone (DESYREL) 100 MG tablet Take 150 mg by mouth at bedtime.     No current facility-administered medications for this visit.      Physical Exam  Blood pressure (!) 141/99, pulse 74, temperature (!) 97 F (36.1 C), height 5\' 4"  (1.626 m), weight 231 lb 8 oz (105 kg).  Constitutional: overall normal hygiene, normal nutrition, well developed, normal grooming, normal body  habitus. Assistive device:none  Musculoskeletal: gait and station Limp right, muscle tone and strength are normal, no tremors or atrophy is present.  .  Neurological: coordination overall normal.  Deep tendon reflex/nerve stretch intact.  Sensation normal.  Cranial nerves II-XII intact.   Skin:   Normal overall no scars, lesions, ulcers or rashes. No psoriasis.  Psychiatric: Alert and oriented x 3.  Recent memory intact, remote memory unclear.  Normal mood and affect. Well groomed.  Good eye contact.  Cardiovascular: overall no swelling, no varicosities, no edema bilaterally, normal temperatures of the legs and arms, no clubbing, cyanosis and good capillary refill.  Lymphatic: palpation is normal.  Right knee is tender, ROM 0 to 110, limp right, crepitus, positive medial McMurray. NV intact.  All other systems reviewed and are negative   The patient has been educated about the nature of the problem(s) and counseled on treatment options.  The patient appeared to understand what I have discussed and is in agreement with it.  Encounter Diagnoses  Name Primary?  . Bilateral chronic knee pain Yes  . Nicotine dependence, cigarettes, uncomplicated     PLAN Call if any problems.  Precautions discussed.  Continue current medications.   Return to clinic 3 weeks   Get MRI of the right knee.  Electronically Signed Sanjuana Kava, MD 9/10/20208:53 AM

## 2019-03-21 NOTE — Addendum Note (Signed)
Addended by: Derek Mound A on: 03/21/2019 08:57 AM   Modules accepted: Orders

## 2019-03-28 ENCOUNTER — Ambulatory Visit (HOSPITAL_COMMUNITY)
Admission: RE | Admit: 2019-03-28 | Discharge: 2019-03-28 | Disposition: A | Discharge status: Home / Self Care | Payer: Medicare Other | Source: Ambulatory Visit | Attending: Orthopaedic Surgery | Admitting: Orthopaedic Surgery

## 2019-03-28 ENCOUNTER — Other Ambulatory Visit: Payer: Self-pay

## 2019-03-28 DIAGNOSIS — M25561 Pain in right knee: Secondary | ICD-10-CM | POA: Insufficient documentation

## 2019-03-28 DIAGNOSIS — M25562 Pain in left knee: Secondary | ICD-10-CM | POA: Insufficient documentation

## 2019-03-28 DIAGNOSIS — G8929 Other chronic pain: Secondary | ICD-10-CM | POA: Insufficient documentation

## 2019-04-01 ENCOUNTER — Other Ambulatory Visit: Payer: Self-pay | Admitting: Orthopaedic Surgery

## 2019-04-16 ENCOUNTER — Ambulatory Visit: Payer: Medicare Other | Admitting: Orthopaedic Surgery

## 2019-04-23 ENCOUNTER — Other Ambulatory Visit: Payer: Self-pay

## 2019-04-23 ENCOUNTER — Ambulatory Visit (INDEPENDENT_AMBULATORY_CARE_PROVIDER_SITE_OTHER): Payer: Medicare Other | Admitting: Orthopaedic Surgery

## 2019-04-23 ENCOUNTER — Encounter: Payer: Self-pay | Admitting: Orthopaedic Surgery

## 2019-04-23 VITALS — BP 98/70 | HR 77 | Temp 97.3°F | Ht 64.0 in | Wt 231.0 lb

## 2019-04-23 DIAGNOSIS — F17210 Nicotine dependence, cigarettes, uncomplicated: Secondary | ICD-10-CM

## 2019-04-23 DIAGNOSIS — G8929 Other chronic pain: Secondary | ICD-10-CM | POA: Diagnosis not present

## 2019-04-23 DIAGNOSIS — M25561 Pain in right knee: Secondary | ICD-10-CM | POA: Diagnosis not present

## 2019-04-23 DIAGNOSIS — M25562 Pain in left knee: Secondary | ICD-10-CM | POA: Diagnosis not present

## 2019-04-23 NOTE — Progress Notes (Signed)
Patient [id number] [name], female DOB:Feb 23, 1961, 58 y.o. [id number]  Chief Complaint  Patient presents with  . Knee Pain    chronic bilateral knee pain.    HPI  [name] is a 58 y.o. female who has continued right knee pain that hurts and gives way.  MRI was done showing: IMPRESSION: Dominant finding is tricompartmental osteoarthritis.  Focal undersurface tear posterior horn of the medial meniscus just peripheral to the meniscal root.  Small horizontal tear anterior body of the lateral meniscus reaches the femoral articular surface.  I will have her see Dr. Aline Brochure for possible surgery.   Body mass index is 39.65 kg/m.  ROS  Review of Systems  Constitutional: Positive for activity change.  Respiratory: Positive for shortness of breath.   Musculoskeletal: Positive for arthralgias, gait problem and joint swelling.  Psychiatric/Behavioral: The patient is nervous/anxious.   All other systems reviewed and are negative.   All other systems reviewed and are negative.  The following is a summary of the past history medically, past history surgically, known current medicines, social history and family history.  This information is gathered electronically by the computer from prior information and documentation.  I review this each visit and have found including this information at this point in the chart is beneficial and informative.    Past Medical History:  Diagnosis Date  . Anxiety   . Asthma   . Depression   . Hyperlipemia     Past Surgical History:  Procedure Laterality Date  . ABDOMINAL HYSTERECTOMY    . BACK SURGERY     pins  . BACK SURGERY  2010  . BLADDER REPAIR    . CESAREAN SECTION  1982, 1984  . COLONOSCOPY N/A 06/12/2013   Procedure: COLONOSCOPY;  Surgeon: Rogene Houston, MD;  Location: [facility];  Service: Endoscopy;  Laterality: N/A;  930-moved to 1020 Ann to notify pt    Family History  Problem Relation Age of Onset  .  Colon cancer Other     Social History Social History   Tobacco Use  . Smoking status: Current Every Day Smoker    Packs/day: 12.00    Years: 20.00    Pack years: 240.00    Types: Cigarettes  . Smokeless tobacco: Never Used  Substance Use Topics  . Alcohol use: No    Alcohol/week: 0.0 standard drinks  . Drug use: No    No Known Allergies  Current Outpatient Medications  Medication Sig Dispense Refill  . amphetamine-dextroamphetamine (ADDERALL) 20 MG tablet Take 10 mg by mouth 2 (two) times daily.    . busPIRone (BUSPAR) 5 MG tablet Take 5 mg by mouth 3 (three) times daily.    . cetirizine (ZYRTEC) 10 MG tablet Take 10 mg by mouth as needed.     . Cholecalciferol (VITAMIN D3) 1000 units CAPS Take 5,000 Units by mouth.    . diclofenac (VOLTAREN) 75 MG EC tablet Take 1 tablet (75 mg total) by mouth 2 (two) times daily with a meal. 60 tablet 2  . dicyclomine (BENTYL) 10 MG capsule TAKE  (1)  CAPSULE THREE TIMES DAILY BEFORE MEALS.  (TAKE AT [place]) (Patient not taking: Reported on 02/21/2019) 90 capsule 2  . DULoxetine (CYMBALTA) 30 MG capsule Take 40 mg by mouth daily. Takes along with 60 mg tab to equal 90 mg.    . estradiol (ESTRACE) 2 MG tablet TAKE ONE TABLET AT BEDTIME 30 tablet 11  . ipratropium (ATROVENT)  0.02 % nebulizer solution Take 2.5 mLs (0.5 mg total) by nebulization every 4 (four) hours as needed. 75 mL 2  . loratadine (CLARITIN) 10 MG tablet Take 10 mg by mouth daily.    . magnesium gluconate (MAGONATE) 500 MG tablet Take 500 mg by mouth 2 (two) times daily.    Marland Kitchen oxycodone (OXY-IR) 5 MG capsule Take 5 mg by mouth 3 (three) times daily.     . pravastatin (PRAVACHOL) 40 MG tablet Take 40 mg by mouth at bedtime.     . traZODone (DESYREL) 100 MG tablet Take 150 mg by mouth at bedtime.     No current facility-administered medications for this visit.      Physical Exam  Blood pressure 98/70, pulse 77, temperature (!) 97.3 F (36.3 C), height 5'  4" (1.626 m), weight 231 lb (104.8 kg).  Constitutional: overall normal hygiene, normal nutrition, well developed, normal grooming, normal body habitus. Assistive device:none  Musculoskeletal: gait and station Limp right, muscle tone and strength are normal, no tremors or atrophy is present.  .  Neurological: coordination overall normal.  Deep tendon reflex/nerve stretch intact.  Sensation normal.  Cranial nerves II-XII intact.   Skin:   Normal overall no scars, lesions, ulcers or rashes. No psoriasis.  Psychiatric: Alert and oriented x 3.  Recent memory intact, remote memory unclear.  Normal mood and affect. Well groomed.  Good eye contact.  Cardiovascular: overall no swelling, no varicosities, no edema bilaterally, normal temperatures of the legs and arms, no clubbing, cyanosis and good capillary refill.  Lymphatic: palpation is normal.  Right knee is tender, has effusion, crepitus.  ROM 0 to 105.  Positive medial McMurray.  NV intact.  Limp right.  All other systems reviewed and are negative   The patient has been educated about the nature of the problem(s) and counseled on treatment options.  The patient appeared to understand what I have discussed and is in agreement with it.  Encounter Diagnoses  Name Primary?  . Bilateral chronic knee pain Yes  . Nicotine dependence, cigarettes, uncomplicated     PLAN Call if any problems.  Precautions discussed.  Continue current medications.   Return to clinic to see Dr. Aline Brochure   Electronically Signed Sanjuana Kava, MD 10/13/20209:22 AM

## 2019-04-30 ENCOUNTER — Encounter: Payer: Self-pay | Admitting: Orthopedic Surgery

## 2019-04-30 ENCOUNTER — Ambulatory Visit (INDEPENDENT_AMBULATORY_CARE_PROVIDER_SITE_OTHER): Payer: Medicare Other | Admitting: Orthopedic Surgery

## 2019-04-30 ENCOUNTER — Other Ambulatory Visit: Payer: Self-pay

## 2019-04-30 VITALS — BP 132/55 | HR 78 | Temp 97.5°F | Ht 64.0 in | Wt 236.0 lb

## 2019-04-30 DIAGNOSIS — M23341 Other meniscus derangements, anterior horn of lateral meniscus, right knee: Secondary | ICD-10-CM | POA: Diagnosis not present

## 2019-04-30 DIAGNOSIS — M1710 Unilateral primary osteoarthritis, unspecified knee: Secondary | ICD-10-CM

## 2019-04-30 DIAGNOSIS — F17210 Nicotine dependence, cigarettes, uncomplicated: Secondary | ICD-10-CM | POA: Diagnosis not present

## 2019-04-30 DIAGNOSIS — M23321 Other meniscus derangements, posterior horn of medial meniscus, right knee: Secondary | ICD-10-CM | POA: Diagnosis not present

## 2019-04-30 DIAGNOSIS — Z20822 Contact with and (suspected) exposure to covid-19: Secondary | ICD-10-CM

## 2019-04-30 NOTE — Progress Notes (Signed)
[name]  04/30/2019  HISTORY SECTION :  Chief Complaint  Patient presents with  . Knee Pain    Right knee pain    58 year old female sent to me by Dr. Luna Glasgow for possible knee surgery  She is again 58 years old she has stopped smoking for the last 4 weeks after a long lifetime of smoking.  She complains of sharp and dull 8 out of 10 pain medial side of her knee for 2 to 3 years associated with swelling popping and grinding and going in and out of place.  Prior treatment includes diclofenac naproxen and intra-articular injection of steroid with no relief     ROS I have positive system reviews as shortness of breath on occasion arthralgias gait issues joint swelling nervousness anxiety and all other systems negative   has a past medical history of Anxiety, Asthma, Depression, and Hyperlipemia.   Past Surgical History:  Procedure Laterality Date  . ABDOMINAL HYSTERECTOMY    . BACK SURGERY     pins  . BACK SURGERY  2010  . BLADDER REPAIR    . CESAREAN SECTION  1982, 1984  . COLONOSCOPY N/A 06/12/2013   Procedure: COLONOSCOPY;  Surgeon: Rogene Houston, MD;  Location: [facility];  Service: Endoscopy;  Laterality: N/A;  930-moved to 1020 Ann to notify pt    Social History   Tobacco Use  . Smoking status: Current Every Day Smoker    Packs/day: 12.00    Years: 20.00    Pack years: 240.00    Types: Cigarettes  . Smokeless tobacco: Never Used  Substance Use Topics  . Alcohol use: No    Alcohol/week: 0.0 standard drinks  . Drug use: No   Family History  Problem Relation Age of Onset  . Colon cancer Other      No Known Allergies   Current Outpatient Medications:  .  amphetamine-dextroamphetamine (ADDERALL) 20 MG tablet, Take 10 mg by mouth 2 (two) times daily., Disp: , Rfl:  .  busPIRone (BUSPAR) 5 MG tablet, Take 5 mg by mouth 3 (three) times daily., Disp: , Rfl:  .  Cholecalciferol (VITAMIN D3) 1000 units CAPS, Take 5,000 Units by mouth., Disp: , Rfl:   .  diclofenac (VOLTAREN) 75 MG EC tablet, Take 1 tablet (75 mg total) by mouth 2 (two) times daily with a meal., Disp: 60 tablet, Rfl: 2 .  dicyclomine (BENTYL) 10 MG capsule, TAKE  (1)  CAPSULE THREE TIMES DAILY BEFORE MEALS.  (TAKE AT LEAST 30MIN-   BEFORE MEALS), Disp: 90 capsule, Rfl: 2 .  estradiol (ESTRACE) 2 MG tablet, TAKE ONE TABLET AT BEDTIME, Disp: 30 tablet, Rfl: 11 .  loratadine (CLARITIN) 10 MG tablet, Take 10 mg by mouth daily., Disp: , Rfl:  .  magnesium gluconate (MAGONATE) 500 MG tablet, Take 500 mg by mouth 2 (two) times daily., Disp: , Rfl:  .  oxycodone (OXY-IR) 5 MG capsule, Take 5 mg by mouth 3 (three) times daily. , Disp: , Rfl:  .  pravastatin (PRAVACHOL) 40 MG tablet, Take 40 mg by mouth at bedtime. , Disp: , Rfl:  .  traZODone (DESYREL) 100 MG tablet, Take 150 mg by mouth at bedtime., Disp: , Rfl:  .  cetirizine (ZYRTEC) 10 MG tablet, Take 10 mg by mouth as needed. , Disp: , Rfl:  .  DULoxetine (CYMBALTA) 30 MG capsule, Take 40 mg by mouth daily. Takes along with 60 mg tab to equal 90 mg., Disp: , Rfl:  .  ipratropium (ATROVENT) 0.02 % nebulizer solution, Take 2.5 mLs (0.5 mg total) by nebulization every 4 (four) hours as needed., Disp: 75 mL, Rfl: 2   PHYSICAL EXAM SECTION: BP (!) 132/55   Pulse 78   Temp (!) 97.5 F (36.4 C)   Ht 5\' 4"  (1.626 m)   Wt 236 lb (107 kg)   BMI 40.51 kg/m   Body mass index is 40.51 kg/m.   General appearance: Well-developed well-nourished no gross deformities  Eyes clear normal vision no evidence of conjunctivitis or jaundice, extraocular muscles intact  ENT: ears hearing normal, nasal passages clear, throat clear   Lymph nodes: No lymphadenopathy  Neck is supple without palpable mass, full range of motion  Cardiovascular normal pulse and perfusion in all 4 extremities normal color without edema  Neurologically deep tendon reflexes are equal and normal, no sensation loss or deficits no pathologic  reflexes  Psychological: Awake alert and oriented x3 mood and affect normal  Skin no lacerations or ulcerations no nodularity no palpable masses, no erythema or nodularity  Musculoskeletal:   Left knee tender medial joint line, crepitance, still has good range of motion 120 degrees no instability muscle tone normal skin looks clean  Right knee medial joint line tenderness, crepitance patellofemoral joint with pain, excellent range of motion no instability Murray sign produced pain skin left normal muscle tone was excellent quadricep strength was normal   MEDICAL DECISION SECTION:  Encounter Diagnoses  Name Primary?  . Nicotine dependence, cigarettes, uncomplicated Yes  . Primary localized osteoarthritis of knee   . Derangement of posterior horn of medial meniscus of right knee   . Derangement of anterior horn of lateral meniscus of right knee     Imaging The first images I was able to look at and read are the plain films there is grade 1 osteoarthritis on plain film with osteophytes surrounding but minimal joint space narrowing it appears to affect all 3 compartments  The MRI shows a torn medial meniscus posteriorly and a questionable lateral meniscal tear with thinning of the cartilage in all 3 compartments  The images have been reviewed with the patient including models we discussed the pluses and minuses of surgery and what arthroscopy could not could not do.  Made the patient aware that she will still need treatment for arthritis with oral medication as the scope will not address all of these issues  Plan:  (Rx., Inj., surg., Frx, MRI/CT, XR:2)  Arthroscopy right knee partial medial and lateral meniscectomy  3:05 PM

## 2019-04-30 NOTE — Patient Instructions (Signed)
Meniscus Injury, Arthroscopy   Arthroscopy is a surgical procedure that involves the use of a small scope that has a camera and surgical instruments on the end (arthroscope). An arthroscope can be used to repair your meniscus injury.  LET YOUR HEALTH CARE PROVIDER KNOW ABOUT:  Any allergies you have.  All medicines you are taking, including vitamins, herbs, eyedrops, creams, and over-the-counter medicines.  Any recent colds or infections you have had or currently have.  Previous problems you or members of your family have had with the use of anesthetics.  Any blood disorders or blood clotting problems you have.  Previous surgeries you have had.  Medical conditions you have. RISKS AND COMPLICATIONS Generally, this is a safe procedure. However, as with any procedure, problems can occur. Possible problems include:  Damage to nerves or blood vessels.  Excess bleeding.  Blood clots.  Infection. BEFORE THE PROCEDURE  Do not eat or drink for 6-8 hours before the procedure.  Take medicines as directed by your surgeon. Ask your surgeon about changing or stopping your regular medicines.  You may have lab tests the morning of surgery. PROCEDURE  You will be given one of the following:   A medicine that numbs the area (local anesthesia).  A medicine that makes you go to sleep (general anesthesia).  A medicine injected into your spine that numbs your body below the waist (spinal anesthesia). Most often, several small cuts (incisions) are made in the knee. The arthroscope and instruments go into the incisions to repair the damage. The torn portion of the meniscus is removed.   AFTER THE PROCEDURE  You will be taken to the recovery area where your progress will be monitored. When you are awake, stable, and taking fluids without complications, you will be allowed to go home. This is usually the same day. A torn or stretched ligament (ligament sprain) may take 6-8 weeks to heal.   It  takes about the 4-6 WEEKS if your surgeon removed a torn meniscus.  A repaired meniscus may require 6-12 weeks of recovery time.  A torn ligament needing reconstructive surgery may take 6-12 months to heal fully.   This information is not intended to replace advice given to you by your health care provider. Make sure you discuss any questions you have with your health care provider. You have decided to proceed with operative arthroscopy of the knee. You have decided not to continue with nonoperative measures such as but not limited to oral medication, weight loss, activity modification, physical therapy, bracing, or injection.  We will perform operative arthroscopy of the knee. Some of the risks associated with arthroscopic surgery of the knee include but are not limited to Bleeding Infection Swelling Stiffness Blood clot Pain Need for knee replacement surgery    In compliance with recent [place] law in federal regulation regarding opioid use and abuse and addiction, we will taper (stop) opioid medication after 2 weeks.  If you're not comfortable with these risks and would like to continue with nonoperative treatment please let Dr. Maythe Deramo know prior to your surgery. 

## 2019-04-30 NOTE — Addendum Note (Signed)
Addended byCandice Camp on: 04/30/2019 03:23 PM   Modules accepted: Orders, SmartSet

## 2019-05-01 LAB — NOVEL CORONAVIRUS, NAA: SARS-CoV-2, NAA: NOT DETECTED

## 2019-05-02 NOTE — Patient Instructions (Signed)
Your procedure is scheduled on: 05/09/2019  Report to [facility] at 7:50    AM.  Call this number if you have problems the morning of surgery: [phone]   Remember:   Do not Eat or Drink after midnight   :  Take these medicines the morning of surgery with A SIP OF WATER: Buspirone, Cymbalta, Prozac, Claritin, and Adderall   Do not wear jewelry, make-up or nail polish.  Do not wear lotions, powders, or perfumes. You may wear deodorant.  Do not shave 48 hours prior to surgery. Men may shave face and neck.  Do not bring valuables to the hospital.  Contacts, dentures or bridgework may not be worn into surgery.  Leave suitcase in the car. After surgery it may be brought to your room.  For patients admitted to the hospital, checkout time is 11:00 AM the day of discharge.   Patients discharged the day of surgery will not be allowed to drive home.    Special Instructions: Shower using CHG night before surgery and shower the day of surgery use CHG.  Use special wash - you have one bottle of CHG for all showers.  You should use approximately 1/2 of the bottle for each shower.  Knee Arthroscopy Knee arthroscopy is a surgery to examine the inside of the knee joint and repair any damage to cartilage, surfaces, and other soft tissues around the joint. You may have this surgery if non-surgical treatment has not relieved your symptoms. Knee arthroscopy may be used to:  Repair a torn ligament or other torn tissues. Ligaments are tissues that connect bones to each other.  Remove bone fragments.  Remove a fluid-filled sac (cyst).  Treat kneecap (patella)problems.  Treat an advanced infection in the knee (septicknee). Arthroscopic surgery is done using a thin tube that has a light and camera on the end of it (arthroscope). The arthroscope is placed through a small incision, and the camera sends images to a screen in the operating room. The images are used to help perform the surgery. Tell a  health care provider about:  Any allergies you have.  All medicines you are taking, including vitamins, herbs, eye drops, creams, and over-the-counter medicines.  Any problems you or family members have had with anesthetic medicines.  Any blood disorders you have.  Any surgeries you have had.  Any medical conditions you have.  Whether you are pregnant or may be pregnant. What are the risks? Generally, this is a safe procedure. However, problems may occur, including:  Infection.  Bleeding.  Allergic reactions to medicines.  Damage to blood vessels, nerves, or tissues in the knee.  A blood clot that forms in the leg and travels to the lung (pulmonary embolism).  Failure of the surgery to relieve symptoms.  Knee stiffness. What happens before the procedure? Staying hydrated Follow instructions from your health care provider about hydration, which may include:  Up to 2 hours before the procedure - you may continue to drink clear liquids, such as water, clear fruit juice, black coffee, and plain tea. Eating and drinking restrictions Follow instructions from your health care provider about eating and drinking, which may include:  8 hours before the procedure - stop eating heavy meals or foods such as meat, fried foods, or fatty foods.  6 hours before the procedure - stop eating light meals or foods, such as toast or cereal.  6 hours before the procedure - stop drinking milk or drinks that contain milk.  2 hours  before the procedure - stop drinking clear liquids. Medicines Ask your health care provider about:  Changing or stopping your regular medicines. This is especially important if you are taking diabetes medicines or blood thinners.  Taking medicines such as aspirin and ibuprofen. These medicines can thin your blood. Do not take these medicines unless your health care provider tells you to take them.  Taking over-the-counter medicines, vitamins, herbs, and  supplements. Testing  Your knee may be examined. Your health care provider may move your knee or ask you to move it in specific ways to see how much motion you have.  You may have blood tests.  You may have imaging tests, such as an X-ray, MRI, or CT scan.  You may have an electrocardiogram. This test records the electrical activity in the heart. General instructions  Do not drink alcohol unless your health care provider says that you can.  Do not use any products that contain nicotine or tobacco, such as cigarettes and e-cigarettes, for one month or more before your surgery. If you need help quitting, ask your health care provider.  Plan to have someone take you home from the hospital or clinic.  Plan to have a responsible adult care for you for at least 24 hours after you leave the hospital or clinic. This is important. What happens during the procedure?   To lower your risk of infection: ? Your health care team will wash or sanitize their hands. ? Hair may be removed from the surgical area. ? Your skin will be washed with soap.  An IV will be inserted into one of your veins.  You will be given one or more of the following: ? A medicine to help you relax (sedative). ? A medicine to numb the knee area (local anesthetic). ? A medicine to make you fall asleep (general anesthetic). ? A medicine that is injected into an area of your body to numb everything below the injection site (regional anesthetic). This may be injected into your groin or thigh.  A cuff may be placed around your upper leg to slow blood flow to your lower leg during the procedure.  Several small incisions will be made around your knee.  Your knee joint will be rinsed (flushed) and filled with a germ-free solution (sterile saline). This expands the area to allow your surgeon to see the joint more clearly.  An arthroscope will be passed through one of your incisions, into your knee joint.  Other surgical  instruments will be passed through the other incisions. Then, your surgeon will examine and repair your knee as needed.  The sterile saline will be drained from your knee, and the cuff will be removed from your upper leg.  Your incisions will be closed with adhesive strips or stitches (sutures) and covered with a bandage (dressing). The procedure may vary among health care providers and hospitals. What happens after the procedure?  Your blood pressure, heart rate, breathing rate, and blood oxygen level will be monitored until the medicines you were given have worn off.  You will be given pain medicine as needed.  You may be given medicine to lower your risk of blood clots.  You may have to wear compression stockings. These stockings help to prevent blood clots and reduce swelling in your legs.  Your health care provider will give you instructions about how much body weight you can safely support on your leg (weight-bearing restrictions). You may be given crutches or other devices to help  you move around (assistive devices).  You may be shown how to do physical therapy exercises to help you recover.  Do not drive until your health care provider approves. Summary  Knee arthroscopy is a surgery to examine or repair the inside of the knee joint.  Before the procedure, follow instructions from your health care provider about eating and drinking.  Plan to have someone take you home from the hospital or clinic. This information is not intended to replace advice given to you by your health care provider. Make sure you discuss any questions you have with your health care provider. Document Released: 06/24/2000 Document Revised: 06/09/2017 Document Reviewed: 03/30/2017 Elsevier Patient Education  [place].  Knee Arthroscopy, Care After This sheet gives you information about how to care for yourself after your procedure. Your health care provider may also give you more specific  instructions. If you have problems or questions, contact your health care provider. What can I expect after the procedure? After the procedure, it is common to have:  Soreness.  Swelling.  Pain that can be relieved by taking pain medicine. Follow these instructions at home: Incision care   Follow instructions from your health care provider about how to take care of your incisions. Make sure you: ? Wash your hands with soap and water before you change your bandage (dressing). If soap and water are not available, use hand sanitizer. ? Change your dressing as told by your health care provider. ? Leave stitches (sutures), staples, skin glue, or adhesive strips in place. These skin closures may need to stay in place for 2 weeks or longer. If adhesive strip edges start to loosen and curl up, you may trim the loose edges. Do not remove adhesive strips completely unless your health care provider tells you to do that.  Check your incision areas every day for signs of infection. Check for: ? Redness. ? More swelling or pain. ? Fluid or blood. ? Warmth. ? Pus or a bad smell. Bathing  Do not take baths, swim, or use a hot tub until your health care provider approves. Ask your health care provider if you may take showers. You may only be allowed to take sponge baths. Activity  Do not use your knee to support your body weight until your health care provider says that you can. Follow weight-bearing restrictions as told. Use crutches or other devices to help you move around (assistive devices) as directed.  Ask your health care provider what activities are safe for you during recovery, and what activities you need to avoid.  If physical therapy was prescribed, do exercises as directed. Doing exercises may help improve knee movement and flexibility (range of motion).  Do not lift anything that is heavier than 10 lb (4.5 kg), or the limit that you are told, until your health care provider says that  it is safe. Driving  Do not drive until your health care provider approves. You may be able to drive after 1-3 weeks.  Do not drive or use heavy machinery while taking prescription pain medicine. Managing pain, stiffness, and swelling   If directed, put ice on the injured area: ? Put ice in a plastic bag or use the icing device (cold therapy unit) that you were given. Follow instructions from your health care provider about how to use the icing device. ? Place a towel between your skin and the bag or between your skin and the icing device. ? Leave the ice on for 20  minutes, 2-3 times a day.  Move your toes often to avoid stiffness and to lessen swelling.  Raise (elevate) the injured area above the level of your heart while you are sitting or lying down. If you are taking blood thinners:  Before you take any medicines that contain aspirin or NSAIDs, talk with your health care provider. These medicines increase your risk for dangerous bleeding.  Take your medicine exactly as told, at the same time every day.  Avoid activities that could cause injury or bruising, and follow instructions about how to prevent falls.  Wear a medical alert bracelet or carry a card that lists what medicines you take. General instructions  Take over-the-counter and prescription medicines only as told by your health care provider.  If you are taking prescription pain medicine, take actions to prevent or treat constipation. Your health care provider may recommend that you: ? Drink enough fluid to keep your urine pale yellow. ? Eat foods that are high in fiber, such as fresh fruits and vegetables, whole grains, and beans. ? Limit foods that are high in fat and processed sugars, such as fried or sweet foods. ? Take an over-the-counter or prescription medicines for constipation.  Do not use any products that contain nicotine or tobacco, such as cigarettes and e-cigarettes. These can delay incision or bone  healing. If you need help quitting, ask your health care provider.  Wear compression stockings as told by your health care provider. These stockings help to prevent blood clots and reduce swelling in your legs.  Keep all follow-up visits as told by your health care provider. This is important. Contact a health care provider if you:  Have a fever.  Have severe pain.  Have redness around an incision.  Have more swelling.  Have fluid or blood coming from an incision.  Notice that an incision feels warm to the touch.  Notice pus or a bad smell coming from an incision.  Notice that an incision opens up.  Develop a rash. Get help right away if you:  Have difficulty breathing.  Have shortness of breath.  Have chest pain.  Develop pain in your lower leg or at the back of your knee.  Have numbness or tingling in your lower leg or your foot. Summary  Raise (elevate) the injured area above the level of your heart while you are sitting or lying down.  To help relieve pain and swelling, put ice on your leg for 20 minutes at a time, 2-3 times a day.  If you were prescribed a blood thinner, avoid activities that could cause injury or bruising, and follow instructions about how to prevent falls.  If physical therapy was prescribed, do exercises as directed. Doing exercises may help improve range of motion. This information is not intended to replace advice given to you by your health care provider. Make sure you discuss any questions you have with your health care provider. Document Released: 01/14/2005 Document Revised: 06/09/2017 Document Reviewed: 05/10/2017 Elsevier Patient Education  2020 [place] Anesthesia, Adult, Care After This sheet gives you information about how to care for yourself after your procedure. Your health care provider may also give you more specific instructions. If you have problems or questions, contact your health care provider. What can I  expect after the procedure? After the procedure, the following side effects are common:  Pain or discomfort at the IV site.  Nausea.  Vomiting.  Sore throat.  Trouble concentrating.  Feeling cold or  chills.  Weak or tired.  Sleepiness and fatigue.  Soreness and body aches. These side effects can affect parts of the body that were not involved in surgery. Follow these instructions at home:  For at least 24 hours after the procedure:  Have a responsible adult stay with you. It is important to have someone help care for you until you are awake and alert.  Rest as needed.  Do not: ? Participate in activities in which you could fall or become injured. ? Drive. ? Use heavy machinery. ? Drink alcohol. ? Take sleeping pills or medicines that cause drowsiness. ? Make important decisions or sign legal documents. ? Take care of children on your own. Eating and drinking  Follow any instructions from your health care provider about eating or drinking restrictions.  When you feel hungry, start by eating small amounts of foods that are soft and easy to digest (bland), such as toast. Gradually return to your regular diet.  Drink enough fluid to keep your urine pale yellow.  If you vomit, rehydrate by drinking water, juice, or clear broth. General instructions  If you have sleep apnea, surgery and certain medicines can increase your risk for breathing problems. Follow instructions from your health care provider about wearing your sleep device: ? Anytime you are sleeping, including during daytime naps. ? While taking prescription pain medicines, sleeping medicines, or medicines that make you drowsy.  Return to your normal activities as told by your health care provider. Ask your health care provider what activities are safe for you.  Take over-the-counter and prescription medicines only as told by your health care provider.  If you smoke, do not smoke without supervision.  Keep  all follow-up visits as told by your health care provider. This is important. Contact a health care provider if:  You have nausea or vomiting that does not get better with medicine.  You cannot eat or drink without vomiting.  You have pain that does not get better with medicine.  You are unable to pass urine.  You develop a skin rash.  You have a fever.  You have redness around your IV site that gets worse. Get help right away if:  You have difficulty breathing.  You have chest pain.  You have blood in your urine or stool, or you vomit blood. Summary  After the procedure, it is common to have a sore throat or nausea. It is also common to feel tired.  Have a responsible adult stay with you for the first 24 hours after general anesthesia. It is important to have someone help care for you until you are awake and alert.  When you feel hungry, start by eating small amounts of foods that are soft and easy to digest (bland), such as toast. Gradually return to your regular diet.  Drink enough fluid to keep your urine pale yellow.  Return to your normal activities as told by your health care provider. Ask your health care provider what activities are safe for you. This information is not intended to replace advice given to you by your health care provider. Make sure you discuss any questions you have with your health care provider. Document Released: 10/03/2000 Document Revised: 06/30/2017 Document Reviewed: 02/10/2017 Elsevier Patient Education  2020 [employer].

## 2019-05-06 ENCOUNTER — Telehealth: Payer: Self-pay | Admitting: General Practice

## 2019-05-06 NOTE — Telephone Encounter (Signed)
Negative COVID results given. Patient results "NOT Detected." Caller expressed understanding. ° °

## 2019-05-07 ENCOUNTER — Other Ambulatory Visit: Payer: Self-pay

## 2019-05-07 ENCOUNTER — Encounter (HOSPITAL_COMMUNITY): Payer: Self-pay

## 2019-05-07 ENCOUNTER — Other Ambulatory Visit (HOSPITAL_COMMUNITY)
Admission: RE | Admit: 2019-05-07 | Discharge: 2019-05-07 | Disposition: A | Discharge status: Home / Self Care | Payer: Medicare Other | Source: Ambulatory Visit | Attending: Orthopedic Surgery | Admitting: Orthopedic Surgery

## 2019-05-07 ENCOUNTER — Encounter (HOSPITAL_COMMUNITY)
Admission: RE | Admit: 2019-05-07 | Discharge: 2019-05-07 | Disposition: A | Discharge status: Home / Self Care | Payer: Medicare Other | Source: Ambulatory Visit | Attending: Orthopedic Surgery | Admitting: Orthopedic Surgery

## 2019-05-07 DIAGNOSIS — Z79899 Other long term (current) drug therapy: Secondary | ICD-10-CM | POA: Diagnosis not present

## 2019-05-07 DIAGNOSIS — M1711 Unilateral primary osteoarthritis, right knee: Secondary | ICD-10-CM | POA: Insufficient documentation

## 2019-05-07 DIAGNOSIS — Z01812 Encounter for preprocedural laboratory examination: Secondary | ICD-10-CM | POA: Insufficient documentation

## 2019-05-07 DIAGNOSIS — F17210 Nicotine dependence, cigarettes, uncomplicated: Secondary | ICD-10-CM | POA: Diagnosis not present

## 2019-05-07 HISTORY — DX: Unspecified osteoarthritis, unspecified site: M19.90

## 2019-05-07 LAB — CBC WITH DIFFERENTIAL/PLATELET
Abs Immature Granulocytes: 0.04 10*3/uL (ref 0.00–0.07)
Basophils Absolute: 0.1 10*3/uL (ref 0.0–0.1)
Basophils Relative: 1 %
Eosinophils Absolute: 0.3 10*3/uL (ref 0.0–0.5)
Eosinophils Relative: 3 %
HCT: 42.3 % (ref 36.0–46.0)
Hemoglobin: 13.8 g/dL (ref 12.0–15.0)
Immature Granulocytes: 0 %
Lymphocytes Relative: 15 %
Lymphs Abs: 1.4 10*3/uL (ref 0.7–4.0)
MCH: 31.4 pg (ref 26.0–34.0)
MCHC: 32.6 g/dL (ref 30.0–36.0)
MCV: 96.1 fL (ref 80.0–100.0)
Monocytes Absolute: 0.5 10*3/uL (ref 0.1–1.0)
Monocytes Relative: 5 %
Neutro Abs: 7 10*3/uL (ref 1.7–7.7)
Neutrophils Relative %: 76 %
Platelets: 242 10*3/uL (ref 150–400)
RBC: 4.4 MIL/uL (ref 3.87–5.11)
RDW: 13.9 % (ref 11.5–15.5)
WBC: 9.3 10*3/uL (ref 4.0–10.5)
nRBC: 0 % (ref 0.0–0.2)

## 2019-05-07 LAB — BASIC METABOLIC PANEL
Anion gap: 10 (ref 5–15)
BUN: 13 mg/dL (ref 6–20)
CO2: 21 mmol/L — ABNORMAL LOW (ref 22–32)
Calcium: 8.9 mg/dL (ref 8.9–10.3)
Chloride: 107 mmol/L (ref 98–111)
Creatinine, Ser: 0.74 mg/dL (ref 0.44–1.00)
GFR calc Af Amer: >60 mL/min (ref >60)
GFR calc non Af Amer: >60 mL/min (ref >60)
Glucose, Bld: 180 mg/dL — ABNORMAL HIGH (ref 70–99)
Potassium: 3.6 mmol/L (ref 3.5–5.1)
Sodium: 138 mmol/L (ref 135–145)

## 2019-05-07 LAB — SARS CORONAVIRUS 2 (TAT 6-24 HRS): SARS Coronavirus 2: NEGATIVE

## 2019-05-08 NOTE — H&P (Signed)
[name]   04/30/2019   HISTORY SECTION :       Chief Complaint  Patient presents with  . Knee Pain      Right knee pain     58 year old female sent to me by Dr. Luna Glasgow for possible knee surgery   She is again 58 years old she has stopped smoking for the last 4 weeks after a long lifetime of smoking.  She complains of sharp and dull 8 out of 10 pain medial side of her knee for 2 to 3 years associated with swelling popping and grinding and going in and out of place.  Prior treatment includes diclofenac naproxen and intra-articular injection of steroid with no relief         ROS I have positive system reviews as shortness of breath on occasion arthralgias gait issues joint swelling nervousness anxiety and all other systems negative    has a past medical history of Anxiety, Asthma, Depression, and Hyperlipemia.         Past Surgical History:  Procedure Laterality Date  . ABDOMINAL HYSTERECTOMY      . BACK SURGERY        pins  . BACK SURGERY   2010  . BLADDER REPAIR      . CESAREAN SECTION   1982, 1984  . COLONOSCOPY N/A 06/12/2013    Procedure: COLONOSCOPY;  Surgeon: Rogene Houston, MD;  Location: [facility];  Service: Endoscopy;  Laterality: N/A;  930-moved to 1020 Ann to notify pt      Social History         Tobacco Use  . Smoking status: Current Every Day Smoker      Packs/day: 12.00      Years: 20.00      Pack years: 240.00      Types: Cigarettes  . Smokeless tobacco: Never Used  Substance Use Topics  . Alcohol use: No      Alcohol/week: 0.0 standard drinks  . Drug use: No         Family History  Problem Relation Age of Onset  . Colon cancer Other          No Known Allergies     Current Outpatient Medications:  .  amphetamine-dextroamphetamine (ADDERALL) 20 MG tablet, Take 10 mg by mouth 2 (two) times daily., Disp: , Rfl:  .  busPIRone (BUSPAR) 5 MG tablet, Take 5 mg by mouth 3 (three) times daily., Disp: , Rfl:  .  Cholecalciferol  (VITAMIN D3) 1000 units CAPS, Take 5,000 Units by mouth., Disp: , Rfl:  .  diclofenac (VOLTAREN) 75 MG EC tablet, Take 1 tablet (75 mg total) by mouth 2 (two) times daily with a meal., Disp: 60 tablet, Rfl: 2 .  dicyclomine (BENTYL) 10 MG capsule, TAKE  (1)  CAPSULE THREE TIMES DAILY BEFORE MEALS.  (TAKE AT LEAST 30MIN-   BEFORE MEALS), Disp: 90 capsule, Rfl: 2 .  estradiol (ESTRACE) 2 MG tablet, TAKE ONE TABLET AT BEDTIME, Disp: 30 tablet, Rfl: 11 .  loratadine (CLARITIN) 10 MG tablet, Take 10 mg by mouth daily., Disp: , Rfl:  .  magnesium gluconate (MAGONATE) 500 MG tablet, Take 500 mg by mouth 2 (two) times daily., Disp: , Rfl:  .  oxycodone (OXY-IR) 5 MG capsule, Take 5 mg by mouth 3 (three) times daily. , Disp: , Rfl:  .  pravastatin (PRAVACHOL) 40 MG tablet, Take 40 mg by mouth at bedtime. , Disp: , Rfl:  .  traZODone (DESYREL) 100 MG tablet, Take 150 mg by mouth at bedtime., Disp: , Rfl:  .  cetirizine (ZYRTEC) 10 MG tablet, Take 10 mg by mouth as needed. , Disp: , Rfl:  .  DULoxetine (CYMBALTA) 30 MG capsule, Take 40 mg by mouth daily. Takes along with 60 mg tab to equal 90 mg., Disp: , Rfl:  .  ipratropium (ATROVENT) 0.02 % nebulizer solution, Take 2.5 mLs (0.5 mg total) by nebulization every 4 (four) hours as needed., Disp: 75 mL, Rfl: 2     PHYSICAL EXAM SECTION: BP (!) 132/55   Pulse 78   Temp (!) 97.5 F (36.4 C)   Ht 5\' 4"  (1.626 m)   Wt 236 lb (107 kg)   BMI 40.51 kg/m   Body mass index is 40.51 kg/m.     General appearance: Well-developed well-nourished no gross deformities   Eyes clear normal vision no evidence of conjunctivitis or jaundice, extraocular muscles intact   ENT: ears hearing normal, nasal passages clear, throat clear   Lymph nodes: No lymphadenopathy   Neck is supple without palpable mass, full range of motion  Cardiovascular normal pulse and perfusion in all 4 extremities normal color without edema  Neurologically deep tendon reflexes are equal and  normal, no sensation loss or deficits no pathologic reflexes   Psychological: Awake alert and oriented x3 mood and affect normal   Skin no lacerations or ulcerations no nodularity no palpable masses, no erythema or nodularity   Musculoskeletal:    Left knee tender medial joint line, crepitance, still has good range of motion 120 degrees no instability muscle tone normal skin looks clean   Right knee medial joint line tenderness, crepitance patellofemoral joint with pain, excellent range of motion no instability Murray sign produced pain skin left normal muscle tone was excellent quadricep strength was normal     MEDICAL DECISION SECTION:      Encounter Diagnoses  Name Primary?  . Nicotine dependence, cigarettes, uncomplicated Yes  . Primary localized osteoarthritis of knee    . Derangement of posterior horn of medial meniscus of right knee    . Derangement of anterior horn of lateral meniscus of right knee        Imaging The first images I was able to look at and read are the plain films there is grade 1 osteoarthritis on plain film with osteophytes surrounding but minimal joint space narrowing it appears to affect all 3 compartments   The MRI shows a torn medial meniscus posteriorly and a questionable lateral meniscal tear with thinning of the cartilage in all 3 compartments   The images have been reviewed with the patient including models we discussed the pluses and minuses of surgery and what arthroscopy could not could not do.  Made the patient aware that she will still need treatment for arthritis with oral medication as the scope will not address all of these issues   Plan:  (Rx., Inj., surg., Frx, MRI/CT, XR:2)   Arthroscopy right knee partial medial and lateral meniscectomy   3:05 PM 05/08/2019

## 2019-05-09 ENCOUNTER — Ambulatory Visit (HOSPITAL_COMMUNITY): Payer: Medicare Other | Admitting: Anesthesiology

## 2019-05-09 ENCOUNTER — Encounter (HOSPITAL_COMMUNITY): Payer: Self-pay | Admitting: *Deleted

## 2019-05-09 ENCOUNTER — Encounter (HOSPITAL_COMMUNITY)
Admission: RE | Disposition: A | Discharge status: Home / Self Care | Payer: Self-pay | Source: Home / Self Care | Attending: Orthopedic Surgery

## 2019-05-09 ENCOUNTER — Ambulatory Visit (HOSPITAL_COMMUNITY)
Admission: RE | Admit: 2019-05-09 | Discharge: 2019-05-09 | Disposition: A | Discharge status: Home / Self Care | Payer: Medicare Other | Attending: Orthopedic Surgery | Admitting: Orthopedic Surgery

## 2019-05-09 DIAGNOSIS — Z79899 Other long term (current) drug therapy: Secondary | ICD-10-CM | POA: Diagnosis not present

## 2019-05-09 DIAGNOSIS — Z90710 Acquired absence of both cervix and uterus: Secondary | ICD-10-CM | POA: Diagnosis not present

## 2019-05-09 DIAGNOSIS — F329 Major depressive disorder, single episode, unspecified: Secondary | ICD-10-CM | POA: Diagnosis not present

## 2019-05-09 DIAGNOSIS — Z87891 Personal history of nicotine dependence: Secondary | ICD-10-CM | POA: Insufficient documentation

## 2019-05-09 DIAGNOSIS — E785 Hyperlipidemia, unspecified: Secondary | ICD-10-CM | POA: Diagnosis not present

## 2019-05-09 DIAGNOSIS — F419 Anxiety disorder, unspecified: Secondary | ICD-10-CM | POA: Insufficient documentation

## 2019-05-09 DIAGNOSIS — S83241D Other tear of medial meniscus, current injury, right knee, subsequent encounter: Secondary | ICD-10-CM

## 2019-05-09 DIAGNOSIS — M23261 Derangement of other lateral meniscus due to old tear or injury, right knee: Secondary | ICD-10-CM | POA: Diagnosis not present

## 2019-05-09 DIAGNOSIS — M1711 Unilateral primary osteoarthritis, right knee: Secondary | ICD-10-CM | POA: Insufficient documentation

## 2019-05-09 DIAGNOSIS — R269 Unspecified abnormalities of gait and mobility: Secondary | ICD-10-CM | POA: Diagnosis not present

## 2019-05-09 DIAGNOSIS — Z800 Family history of malignant neoplasm of digestive organs: Secondary | ICD-10-CM | POA: Diagnosis not present

## 2019-05-09 DIAGNOSIS — Z6839 Body mass index (BMI) 39.0-39.9, adult: Secondary | ICD-10-CM | POA: Diagnosis not present

## 2019-05-09 DIAGNOSIS — J45909 Unspecified asthma, uncomplicated: Secondary | ICD-10-CM | POA: Diagnosis not present

## 2019-05-09 DIAGNOSIS — M659 Synovitis and tenosynovitis, unspecified: Secondary | ICD-10-CM | POA: Insufficient documentation

## 2019-05-09 DIAGNOSIS — Z791 Long term (current) use of non-steroidal anti-inflammatories (NSAID): Secondary | ICD-10-CM | POA: Insufficient documentation

## 2019-05-09 DIAGNOSIS — M23241 Derangement of anterior horn of lateral meniscus due to old tear or injury, right knee: Secondary | ICD-10-CM | POA: Insufficient documentation

## 2019-05-09 DIAGNOSIS — M23221 Derangement of posterior horn of medial meniscus due to old tear or injury, right knee: Secondary | ICD-10-CM | POA: Insufficient documentation

## 2019-05-09 HISTORY — PX: KNEE ARTHROSCOPY WITH MEDIAL MENISECTOMY: SHX5651

## 2019-05-09 SURGERY — ARTHROSCOPY, KNEE, WITH MEDIAL MENISCECTOMY
Anesthesia: General | Site: Knee | Laterality: Right

## 2019-05-09 MED ORDER — FENTANYL CITRATE (PF) 100 MCG/2ML IJ SOLN
INTRAMUSCULAR | Status: AC
  Filled 2019-05-09: qty 2

## 2019-05-09 MED ORDER — GLYCOPYRROLATE PF 0.2 MG/ML IJ SOSY
PREFILLED_SYRINGE | INTRAMUSCULAR | Status: AC
  Filled 2019-05-09: qty 3

## 2019-05-09 MED ORDER — FENTANYL CITRATE (PF) 250 MCG/5ML IJ SOLN
INTRAMUSCULAR | Status: AC
  Filled 2019-05-09: qty 5

## 2019-05-09 MED ORDER — BUPIVACAINE-EPINEPHRINE (PF) 0.5% -1:200000 IJ SOLN
INTRAMUSCULAR | Status: DC | PRN
  Administered 2019-05-09: 60 mL

## 2019-05-09 MED ORDER — LACTATED RINGERS IV SOLN
Freq: Once | INTRAVENOUS | Status: AC
  Administered 2019-05-09: 08:00:00 via INTRAVENOUS

## 2019-05-09 MED ORDER — EPHEDRINE 5 MG/ML INJ
INTRAVENOUS | Status: AC
  Filled 2019-05-09: qty 10

## 2019-05-09 MED ORDER — ACETAMINOPHEN 500 MG PO TABS
500.0000 mg | ORAL_TABLET | Freq: Once | ORAL | Status: AC
  Administered 2019-05-09: 12:00:00 500 mg via ORAL

## 2019-05-09 MED ORDER — ACETAMINOPHEN 500 MG PO TABS
ORAL_TABLET | ORAL | Status: AC
  Filled 2019-05-09: qty 1

## 2019-05-09 MED ORDER — CEFAZOLIN SODIUM-DEXTROSE 2-4 GM/100ML-% IV SOLN
INTRAVENOUS | Status: AC
  Filled 2019-05-09: qty 100

## 2019-05-09 MED ORDER — EPINEPHRINE PF 1 MG/ML IJ SOLN
INTRAMUSCULAR | Status: AC
  Filled 2019-05-09: qty 6

## 2019-05-09 MED ORDER — CHLORHEXIDINE GLUCONATE 4 % EX LIQD: 60.0000 mL | Freq: Once | CUTANEOUS | Status: DC

## 2019-05-09 MED ORDER — HYDROCODONE-ACETAMINOPHEN 7.5-325 MG PO TABS
ORAL_TABLET | ORAL | Status: AC
  Filled 2019-05-09: qty 1

## 2019-05-09 MED ORDER — HYDROCODONE-ACETAMINOPHEN 7.5-325 MG PO TABS
1.0000 | ORAL_TABLET | Freq: Once | ORAL | Status: AC
  Administered 2019-05-09: 12:00:00 1 via ORAL

## 2019-05-09 MED ORDER — ONDANSETRON HCL 4 MG/2ML IJ SOLN
INTRAMUSCULAR | Status: AC
  Filled 2019-05-09: qty 2

## 2019-05-09 MED ORDER — OXYCODONE HCL 5 MG PO TABS: 5.0000 mg | ORAL_TABLET | ORAL | 0 refills | Status: DC | PRN

## 2019-05-09 MED ORDER — ONDANSETRON HCL 4 MG/2ML IJ SOLN
INTRAMUSCULAR | Status: DC | PRN
  Administered 2019-05-09: 4 mg via INTRAVENOUS

## 2019-05-09 MED ORDER — IBUPROFEN 400 MG PO TABS
400.0000 mg | ORAL_TABLET | Freq: Once | ORAL | Status: AC
  Administered 2019-05-09: 12:00:00 400 mg via ORAL

## 2019-05-09 MED ORDER — PROPOFOL 10 MG/ML IV BOLUS
INTRAVENOUS | Status: AC
  Filled 2019-05-09: qty 40

## 2019-05-09 MED ORDER — LIDOCAINE 2% (20 MG/ML) 5 ML SYRINGE
INTRAMUSCULAR | Status: AC
  Filled 2019-05-09: qty 5

## 2019-05-09 MED ORDER — MIDAZOLAM HCL 2 MG/2ML IJ SOLN: 2.0000 mg | Freq: Once | INTRAMUSCULAR | Status: DC

## 2019-05-09 MED ORDER — FENTANYL CITRATE (PF) 100 MCG/2ML IJ SOLN
INTRAMUSCULAR | Status: DC | PRN
  Administered 2019-05-09: 50 ug via INTRAVENOUS
  Administered 2019-05-09: 25 ug via INTRAVENOUS
  Administered 2019-05-09: 50 ug via INTRAVENOUS
  Administered 2019-05-09: 25 ug via INTRAVENOUS
  Administered 2019-05-09: 50 ug via INTRAVENOUS

## 2019-05-09 MED ORDER — ARTIFICIAL TEARS OPHTHALMIC OINT
TOPICAL_OINTMENT | OPHTHALMIC | Status: AC
  Filled 2019-05-09: qty 3.5

## 2019-05-09 MED ORDER — PHENYLEPHRINE 40 MCG/ML (10ML) SYRINGE FOR IV PUSH (FOR BLOOD PRESSURE SUPPORT)
PREFILLED_SYRINGE | INTRAVENOUS | Status: AC
  Filled 2019-05-09: qty 10

## 2019-05-09 MED ORDER — LACTATED RINGERS IV SOLN
INTRAVENOUS | Status: DC | PRN
  Administered 2019-05-09: 10:00:00 via INTRAVENOUS

## 2019-05-09 MED ORDER — HYDROMORPHONE HCL 1 MG/ML IJ SOLN: 0.2500 mg | INTRAMUSCULAR | Status: DC | PRN

## 2019-05-09 MED ORDER — ONDANSETRON HCL 4 MG/2ML IJ SOLN: 4.0000 mg | Freq: Once | INTRAMUSCULAR | Status: DC | PRN

## 2019-05-09 MED ORDER — BUPIVACAINE-EPINEPHRINE (PF) 0.5% -1:200000 IJ SOLN
INTRAMUSCULAR | Status: AC
  Filled 2019-05-09: qty 60

## 2019-05-09 MED ORDER — SODIUM CHLORIDE 0.9 % IR SOLN
Status: DC | PRN
  Administered 2019-05-09 (×3): 3000 mL

## 2019-05-09 MED ORDER — IBUPROFEN 400 MG PO TABS
ORAL_TABLET | ORAL | Status: AC
  Filled 2019-05-09: qty 1

## 2019-05-09 MED ORDER — MEPERIDINE HCL 50 MG/ML IJ SOLN: 6.2500 mg | INTRAMUSCULAR | Status: DC | PRN

## 2019-05-09 MED ORDER — PROPOFOL 10 MG/ML IV BOLUS
INTRAVENOUS | Status: DC | PRN
  Administered 2019-05-09: 50 mg via INTRAVENOUS
  Administered 2019-05-09: 150 mg via INTRAVENOUS
  Administered 2019-05-09 (×2): 50 mg via INTRAVENOUS
  Administered 2019-05-09 (×2): 20 mg via INTRAVENOUS

## 2019-05-09 MED ORDER — CEFAZOLIN SODIUM-DEXTROSE 2-4 GM/100ML-% IV SOLN
2.0000 g | INTRAVENOUS | Status: AC
  Administered 2019-05-09: 2 g via INTRAVENOUS

## 2019-05-09 MED ORDER — SODIUM CHLORIDE 0.9 % IR SOLN
Status: DC | PRN
  Administered 2019-05-09: 1000 mL

## 2019-05-09 SURGICAL SUPPLY — 52 items
APL PRP STRL LF DISP 70% ISPRP (MISCELLANEOUS) ×1
ARTHROWAND PARAGON T2 (SURGICAL WAND)
BANDAGE ELASTIC 6 VELCRO NS (GAUZE/BANDAGES/DRESSINGS) ×3 IMPLANT
BLADE AGGRESSIVE PLUS 4.0 (BLADE) ×3 IMPLANT
BLADE SURG SZ11 CARB STEEL (BLADE) ×3 IMPLANT
CHLORAPREP W/TINT 26 (MISCELLANEOUS) ×3 IMPLANT
CLOTH BEACON ORANGE TIMEOUT ST (SAFETY) ×3 IMPLANT
COOLER CRYO IC GRAV AND TUBE (ORTHOPEDIC SUPPLIES) ×3 IMPLANT
COVER WAND RF STERILE (DRAPES) ×3 IMPLANT
CUFF CRYO KNEE LG 20X31 COOLER (ORTHOPEDIC SUPPLIES) ×2 IMPLANT
CUFF TOURN SGL QUICK 34 (TOURNIQUET CUFF) ×3
CUFF TRNQT CYL 34X4.125X (TOURNIQUET CUFF) IMPLANT
DECANTER SPIKE VIAL GLASS SM (MISCELLANEOUS) ×6 IMPLANT
GAUZE 4X4 16PLY RFD (DISPOSABLE) ×3 IMPLANT
GAUZE SPONGE 4X4 12PLY STRL (GAUZE/BANDAGES/DRESSINGS) ×3 IMPLANT
GAUZE SPONGE 4X4 16PLY XRAY LF (GAUZE/BANDAGES/DRESSINGS) ×3 IMPLANT
GAUZE XEROFORM 5X9 LF (GAUZE/BANDAGES/DRESSINGS) ×3 IMPLANT
GLOVE BIOGEL PI IND STRL 7.0 (GLOVE) ×2 IMPLANT
GLOVE BIOGEL PI INDICATOR 7.0 (GLOVE) ×4
GLOVE ECLIPSE 6.5 STRL STRAW (GLOVE) ×2 IMPLANT
GLOVE SKINSENSE NS SZ8.0 LF (GLOVE) ×2
GLOVE SKINSENSE STRL SZ8.0 LF (GLOVE) ×1 IMPLANT
GLOVE SS N UNI LF 8.5 STRL (GLOVE) ×3 IMPLANT
GOWN STRL REUS W/ TWL LRG LVL3 (GOWN DISPOSABLE) ×1 IMPLANT
GOWN STRL REUS W/TWL LRG LVL3 (GOWN DISPOSABLE) ×3
GOWN STRL REUS W/TWL XL LVL3 (GOWN DISPOSABLE) ×3 IMPLANT
IV NS IRRIG 3000ML ARTHROMATIC (IV SOLUTION) ×8 IMPLANT
KIT BLADEGUARD II DBL (SET/KITS/TRAYS/PACK) ×3 IMPLANT
KIT TURNOVER CYSTO (KITS) ×3 IMPLANT
MANIFOLD NEPTUNE II (INSTRUMENTS) ×3 IMPLANT
MARKER SKIN DUAL TIP RULER LAB (MISCELLANEOUS) ×3 IMPLANT
NDL HYPO 18GX1.5 BLUNT FILL (NEEDLE) ×1 IMPLANT
NDL HYPO 21X1.5 SAFETY (NEEDLE) ×1 IMPLANT
NDL SPNL 18GX3.5 QUINCKE PK (NEEDLE) ×1 IMPLANT
NEEDLE HYPO 18GX1.5 BLUNT FILL (NEEDLE) ×3 IMPLANT
NEEDLE HYPO 21X1.5 SAFETY (NEEDLE) ×3 IMPLANT
NEEDLE SPNL 18GX3.5 QUINCKE PK (NEEDLE) ×3 IMPLANT
NS IRRIG 1000ML POUR BTL (IV SOLUTION) ×3 IMPLANT
PACK ARTHRO LIMB DRAPE STRL (MISCELLANEOUS) ×3 IMPLANT
PAD ABD 5X9 TENDERSORB (GAUZE/BANDAGES/DRESSINGS) ×3 IMPLANT
PAD ARMBOARD 7.5X6 YLW CONV (MISCELLANEOUS) ×3 IMPLANT
PADDING CAST COTTON 6X4 STRL (CAST SUPPLIES) ×3 IMPLANT
PROBE BIPOLAR 50 DEGREE SUCT (MISCELLANEOUS) ×2 IMPLANT
SET ARTHROSCOPY INST (INSTRUMENTS) ×3 IMPLANT
SET BASIN LINEN APH (SET/KITS/TRAYS/PACK) ×3 IMPLANT
SUT ETHILON 3 0 FSL (SUTURE) ×3 IMPLANT
SYR 10ML LL (SYRINGE) ×3 IMPLANT
SYR 30ML LL (SYRINGE) ×3 IMPLANT
TUBE CONNECTING 12'X1/4 (SUCTIONS) ×2
TUBE CONNECTING 12X1/4 (SUCTIONS) ×4 IMPLANT
TUBING ARTHRO INFLOW-ONLY STRL (TUBING) ×3 IMPLANT
WAND ARTHRO PARAGON T2 (SURGICAL WAND) IMPLANT

## 2019-05-09 NOTE — Interval H&P Note (Signed)
History and Physical Interval Note:  05/09/2019 7:57 AM  There were no vitals taken for this visit.  CBC Latest Ref Rng & Units 05/07/2019 01/28/2014 06/13/2013  WBC 4.0 - 10.5 K/uL 9.3 8.8 10.2  Hemoglobin 12.0 - 15.0 g/dL 13.8 12.7 13.3  Hematocrit 36.0 - 46.0 % 42.3 37.5 38.6  Platelets 150 - 400 K/uL 242 [name]  has presented today for surgery, with the diagnosis of Torn medial and lateral meniscus right knee.  The various methods of treatment have been discussed with the patient and family. After consideration of risks, benefits and other options for treatment, the patient has consented to  Procedure(s): KNEE ARTHROSCOPY WITH MEDIAL MENISECTOMY AND LATERAL MENISECTOMY (Right) as a surgical intervention.  The patient's history has been reviewed, patient examined, no change in status, stable for surgery.  I have reviewed the patient's chart and labs.  Questions were answered to the patient's satisfaction.     Arther Abbott

## 2019-05-09 NOTE — Anesthesia Postprocedure Evaluation (Signed)
Anesthesia Post Note  Patient: [name]  Procedure(s) Performed: KNEE ARTHROSCOPY WITH MEDIAL MENISECTOMY AND LATERAL MENISECTOMY (Right Knee)  Patient location during evaluation: PACU Anesthesia Type: General Level of consciousness: awake and alert and patient cooperative Pain management: satisfactory to patient Vital Signs Assessment: post-procedure vital signs reviewed and stable Respiratory status: spontaneous breathing Cardiovascular status: stable Postop Assessment: no apparent nausea or vomiting Anesthetic complications: no     Last Vitals:  Vitals:   05/09/19 1145 05/09/19 1200  BP:  127/70  Pulse: 95 89  Resp: (!) 25 17  Temp:    SpO2: 92% 96%    Last Pain:  Vitals:   05/09/19 1200  TempSrc:   PainSc: 0-No pain                 Walterine Amodei

## 2019-05-09 NOTE — Discharge Instructions (Signed)
Knee Arthroscopy, Care After  This sheet gives you information about how to care for yourself after your procedure. Your health care provider may also give you more specific instructions. If you have problems or questions, contact your health care provider.  What can I expect after the procedure?  After the procedure, it is common to have:  · Soreness.  · Swelling.  · Pain that can be relieved by taking pain medicine.  Follow these instructions at home:  Incision care    · Follow instructions from your health care provider about how to take care of your incisions. Make sure you:  ? Wash your hands with soap and water before you change your bandage (dressing). If soap and water are not available, use hand sanitizer.  ? Change your dressing as told by your health care provider.  ? Leave stitches (sutures), staples, skin glue, or adhesive strips in place. These skin closures may need to stay in place for 2 weeks or longer. If adhesive strip edges start to loosen and curl up, you may trim the loose edges. Do not remove adhesive strips completely unless your health care provider tells you to do that.  · Check your incision areas every day for signs of infection. Check for:  ? Redness.  ? More swelling or pain.  ? Fluid or blood.  ? Warmth.  ? Pus or a bad smell.  Bathing  · Do not take baths, swim, or use a hot tub until your health care provider approves. Ask your health care provider if you may take showers. You may only be allowed to take sponge baths.  Activity  · Do not use your knee to support your body weight until your health care provider says that you can. Follow weight-bearing restrictions as told. Use crutches or other devices to help you move around (assistive devices) as directed.  · Ask your health care provider what activities are safe for you during recovery, and what activities you need to avoid.  · If physical therapy was prescribed, do exercises as directed. Doing exercises may help improve knee  movement and flexibility (range of motion).  · Do not lift anything that is heavier than 10 lb (4.5 kg), or the limit that you are told, until your health care provider says that it is safe.  Driving  · Do not drive until your health care provider approves. You may be able to drive after 1-3 weeks.  · Do not drive or use heavy machinery while taking prescription pain medicine.  Managing pain, stiffness, and swelling    · If directed, put ice on the injured area:  ? Put ice in a plastic bag or use the icing device (cold therapy unit) that you were given. Follow instructions from your health care provider about how to use the icing device.  ? Place a towel between your skin and the bag or between your skin and the icing device.  ? Leave the ice on for 20 minutes, 2-3 times a day.  · Move your toes often to avoid stiffness and to lessen swelling.  · Raise (elevate) the injured area above the level of your heart while you are sitting or lying down.  If you are taking blood thinners:  · Before you take any medicines that contain aspirin or NSAIDs, talk with your health care provider. These medicines increase your risk for dangerous bleeding.  · Take your medicine exactly as told, at the same time every day.  ·   as told by your health care provider.  If you are taking prescription pain medicine, take actions to prevent or treat constipation. Your health care provider may recommend that you: ? Drink enough fluid to keep your urine pale yellow. ? Eat foods that are high in fiber, such as fresh fruits and vegetables, whole grains, and beans. ? Limit foods that are high in fat and processed sugars, such as fried or sweet foods. ? Take an over-the-counter  or prescription medicines for constipation.  Do not use any products that contain nicotine or tobacco, such as cigarettes and e-cigarettes. These can delay incision or bone healing. If you need help quitting, ask your health care provider.  Wear compression stockings as told by your health care provider. These stockings help to prevent blood clots and reduce swelling in your legs.  Keep all follow-up visits as told by your health care provider. This is important. Contact a health care provider if you:  Have a fever.  Have severe pain.  Have redness around an incision.  Have more swelling.  Have fluid or blood coming from an incision.  Notice that an incision feels warm to the touch.  Notice pus or a bad smell coming from an incision.  Notice that an incision opens up.  Develop a rash. Get help right away if you:  Have difficulty breathing.  Have shortness of breath.  Have chest pain.  Develop pain in your lower leg or at the back of your knee.  Have numbness or tingling in your lower leg or your foot. Summary  Raise (elevate) the injured area above the level of your heart while you are sitting or lying down.  To help relieve pain and swelling, put ice on your leg for 20 minutes at a time, 2-3 times a day.  If you were prescribed a blood thinner, avoid activities that could cause injury or bruising, and follow instructions about how to prevent falls.  If physical therapy was prescribed, do exercises as directed. Doing exercises may help improve range of motion. This information is not intended to replace advice given to you by your health care provider. Make sure you discuss any questions you have with your health care provider. Document Released: 01/14/2005 Document Revised: 06/09/2017 Document Reviewed: 05/10/2017 Elsevier Patient Education  2020 [place] Anesthesia, Adult, Care After This sheet gives you information about how to care for yourself  after your procedure. Your health care provider may also give you more specific instructions. If you have problems or questions, contact your health care provider. What can I expect after the procedure? After the procedure, the following side effects are common:  Pain or discomfort at the IV site.  Nausea.  Vomiting.  Sore throat.  Trouble concentrating.  Feeling cold or chills.  Weak or tired.  Sleepiness and fatigue.  Soreness and body aches. These side effects can affect parts of the body that were not involved in surgery. Follow these instructions at home:  For at least 24 hours after the procedure:  Have a responsible adult stay with you. It is important to have someone help care for you until you are awake and alert.  Rest as needed.  Do not: ? Participate in activities in which you could fall or become injured. ? Drive. ? Use heavy machinery. ? Drink alcohol. ? Take sleeping pills or medicines that cause drowsiness. ? Make important decisions or sign legal documents. ? Take care of children on your own. Eating  and drinking  Follow any instructions from your health care provider about eating or drinking restrictions.  When you feel hungry, start by eating small amounts of foods that are soft and easy to digest (bland), such as toast. Gradually return to your regular diet.  Drink enough fluid to keep your urine pale yellow.  If you vomit, rehydrate by drinking water, juice, or clear broth. General instructions  If you have sleep apnea, surgery and certain medicines can increase your risk for breathing problems. Follow instructions from your health care provider about wearing your sleep device: ? Anytime you are sleeping, including during daytime naps. ? While taking prescription pain medicines, sleeping medicines, or medicines that make you drowsy.  Return to your normal activities as told by your health care provider. Ask your health care provider what  activities are safe for you.  Take over-the-counter and prescription medicines only as told by your health care provider.  If you smoke, do not smoke without supervision.  Keep all follow-up visits as told by your health care provider. This is important. Contact a health care provider if:  You have nausea or vomiting that does not get better with medicine.  You cannot eat or drink without vomiting.  You have pain that does not get better with medicine.  You are unable to pass urine.  You develop a skin rash.  You have a fever.  You have redness around your IV site that gets worse. Get help right away if:  You have difficulty breathing.  You have chest pain.  You have blood in your urine or stool, or you vomit blood. Summary  After the procedure, it is common to have a sore throat or nausea. It is also common to feel tired.  Have a responsible adult stay with you for the first 24 hours after general anesthesia. It is important to have someone help care for you until you are awake and alert.  When you feel hungry, start by eating small amounts of foods that are soft and easy to digest (bland), such as toast. Gradually return to your regular diet.  Drink enough fluid to keep your urine pale yellow.  Return to your normal activities as told by your health care provider. Ask your health care provider what activities are safe for you. This information is not intended to replace advice given to you by your health care provider. Make sure you discuss any questions you have with your health care provider. Document Released: 10/03/2000 Document Revised: 06/30/2017 Document Reviewed: 02/10/2017 Elsevier Patient Education  [place], Adult Taking care of your wound properly can help to prevent pain, infection, and scarring. It can also help your wound to heal more quickly. How to care for your wound Wound care      Follow instructions from your health  care provider about how to take care of your wound. Make sure you: ? Wash your hands with soap and water before you change the bandage (dressing). If soap and water are not available, use hand sanitizer. ? Change your dressing as told by your health care provider. ? Leave stitches (sutures), skin glue, or adhesive strips in place. These skin closures may need to stay in place for 2 weeks or longer. If adhesive strip edges start to loosen and curl up, you may trim the loose edges. Do not remove adhesive strips completely unless your health care provider tells you to do that.  Check your wound area every day  for signs of infection. Check for: ? Redness, swelling, or pain. ? Fluid or blood. ? Warmth. ? Pus or a bad smell.  Ask your health care provider if you should clean the wound with mild soap and water. Doing this may include: ? Using a clean towel to pat the wound dry after cleaning it. Do not rub or scrub the wound. ? Applying a cream or ointment. Do this only as told by your health care provider. ? Covering the incision with a clean dressing.  Ask your health care provider when you can leave the wound uncovered.  Keep the dressing dry until your health care provider says it can be removed. Do not take baths, swim, use a hot tub, or do anything that would put the wound underwater until your health care provider approves. Ask your health care provider if you can take showers. You may only be allowed to take sponge baths. Medicines   If you were prescribed an antibiotic medicine, cream, or ointment, take or use the antibiotic as told by your health care provider. Do not stop taking or using the antibiotic even if your condition improves.  Take over-the-counter and prescription medicines only as told by your health care provider. If you were prescribed pain medicine, take it 30 or more minutes before you do any wound care or as told by your health care provider. General  instructions  Return to your normal activities as told by your health care provider. Ask your health care provider what activities are safe.  Do not scratch or pick at the wound.  Do not use any products that contain nicotine or tobacco, such as cigarettes and e-cigarettes. These may delay wound healing. If you need help quitting, ask your health care provider.  Keep all follow-up visits as told by your health care provider. This is important.  Eat a diet that includes protein, vitamin A, vitamin C, and other nutrient-rich foods to help the wound heal. ? Foods rich in protein include meat, dairy, beans, nuts, and other sources. ? Foods rich in vitamin A include carrots and dark green, leafy vegetables. ? Foods rich in vitamin C include citrus, tomatoes, and other fruits and vegetables. ? Nutrient-rich foods have protein, carbohydrates, fat, vitamins, or minerals. Eat a variety of healthy foods including vegetables, fruits, and whole grains. Contact a health care provider if:  You received a tetanus shot and you have swelling, severe pain, redness, or bleeding at the injection site.  Your pain is not controlled with medicine.  You have redness, swelling, or pain around the wound.  You have fluid or blood coming from the wound.  Your wound feels warm to the touch.  You have pus or a bad smell coming from the wound.  You have a fever or chills.  You are nauseous or you vomit.  You are dizzy. Get help right away if:  You have a red streak going away from your wound.  The edges of the wound open up and separate.  Your wound is bleeding, and the bleeding does not stop with gentle pressure.  You have a rash.  You faint.  You have trouble breathing. Summary  Always wash your hands with soap and water before changing your bandage (dressing).  To help with healing, eat foods that are rich in protein, vitamin A, vitamin C, and other nutrients.  Check your wound every day for  signs of infection. Contact your health care provider if you suspect that your wound is  infected. This information is not intended to replace advice given to you by your health care provider. Make sure you discuss any questions you have with your health care provider. Document Released: 04/05/2008 Document Revised: 10/15/2018 Document Reviewed: 01/12/2016 Elsevier Patient Education  2020 [employer].

## 2019-05-09 NOTE — Brief Op Note (Signed)
05/09/2019  11:27 AM  PATIENT:  [name]  58 y.o. female  PRE-OPERATIVE DIAGNOSIS:  Torn medial and lateral meniscus right knee  POST-OPERATIVE DIAGNOSIS:  Torn medial and lateral meniscus right knee, osteoarthritis primary  Findings at surgery  1.  Severe osteoarthritis all 3 compartments  2.  Tear medial meniscus root  3.  Tear anterior horn and body lateral meniscus  4.  Mild synovitis suprapatellar pouch   PROCEDURE:  Procedure(s): KNEE ARTHROSCOPY WITH MEDIAL MENISECTOMY AND LATERAL MENISECTOMY (Right)  29880   SURGEON:  Surgeon(s) and Role:    Carole Civil, MD - Primary  Knee arthroscopy dictation  The patient was identified in the preoperative holding area using 2 approved identification mechanisms. The chart was reviewed and updated. The surgical site was confirmed as right  knee and marked with an indelible marker.  The patient was taken to the operating room for anesthesia. After successful  General  anesthesia, 2gm ancef  was used as IV antibiotics.  The patient was placed in the supine position with the (right leg ) the operative extremity in an arthroscopic leg holder and the opposite extremity in a padded leg holder.  The timeout was executed.  A lateral portal was established with an 11 blade and the scope was introduced into the joint. A diagnostic arthroscopy was performed in circumferential manner examining the entire knee joint. A medial portal was established and the diagnostic arthroscopy was repeated using a probe to palpate intra-articular structures as they were encountered.   The medial meniscus was resected using a duckbill forceps. The meniscal fragments were removed with a motorized shaver. The meniscus was balanced with a combination of a motorized shaver and a 50 ArthroCare wand until a stable rim was obtained.  The lateral meniscus was resected using a motorized shaver  We used a ArthroCare wand to control any bleeding  that we saw.  The arthroscopic pump was placed on the wash mode and any excess debris was removed from the joint using suction.  60 cc of Marcaine with epinephrine was injected through the arthroscope.  The portals were closed with 3-0 nylon suture.  A sterile bandage, Ace wrap and Cryo/Cuff was placed and the Cryo/Cuff was activated. The patient was taken to the recovery room in stable condition.  PHYSICIAN ASSISTANT:   ASSISTANTS: none   ANESTHESIA:   general  EBL:  0 mL   BLOOD ADMINISTERED:none  DRAINS: none   LOCAL MEDICATIONS USED:  MARCAINE     SPECIMEN:  No Specimen  DISPOSITION OF SPECIMEN:  N/A  COUNTS:  YES  TOURNIQUET:  * Missing tourniquet times found for documented tourniquets in log: [id number] *  DICTATION: .Dragon Dictation  PLAN OF CARE: Discharge to home after PACU  PATIENT DISPOSITION:  PACU - hemodynamically stable.   Delay start of Pharmacological VTE agent (>24hrs) due to surgical blood loss or risk of bleeding: no

## 2019-05-09 NOTE — Anesthesia Procedure Notes (Signed)
Procedure Name: LMA Insertion Date/Time: 05/09/2019 10:37 AM Performed by: Vista Deck, CRNA Pre-anesthesia Checklist: Patient identified, Patient being monitored, Emergency Drugs available, Timeout performed and Suction available Patient Re-evaluated:Patient Re-evaluated prior to induction Oxygen Delivery Method: Circle System Utilized Preoxygenation: Pre-oxygenation with 100% oxygen Induction Type: IV induction Ventilation: Mask ventilation without difficulty LMA: LMA inserted LMA Size: 4.0 Number of attempts: 1 Placement Confirmation: positive ETCO2 and breath sounds checked- equal and bilateral Tube secured with: Tape Dental Injury: Teeth and Oropharynx as per pre-operative assessment

## 2019-05-09 NOTE — Anesthesia Preprocedure Evaluation (Addendum)
Anesthesia Evaluation  Patient identified by MRN, date of birth, ID band Patient awake    Reviewed: Allergy & Precautions, NPO status , Patient's Chart, lab work & pertinent test results  Airway Mallampati: II  TM Distance: >3 FB Neck ROM: Full    Dental  (+) Edentulous Upper, Edentulous Lower   Pulmonary asthma , Current Smoker and Patient abstained from smoking., former smoker,    Pulmonary exam normal breath sounds clear to auscultation       Cardiovascular Exercise Tolerance: Good METS: 3 - Mets Normal cardiovascular exam Rhythm:Regular Rate:Normal     Neuro/Psych PSYCHIATRIC DISORDERS Anxiety Depression    GI/Hepatic negative GI ROS, Neg liver ROS,   Endo/Other  Morbid obesity  Renal/GU negative Renal ROS     Musculoskeletal  (+) Arthritis , Osteoarthritis,    Abdominal   Peds  Hematology negative hematology ROS (+)   Anesthesia Other Findings   Reproductive/Obstetrics                           Anesthesia Physical Anesthesia Plan  ASA: II  Anesthesia Plan: General   Post-op Pain Management:    Induction: Intravenous  PONV Risk Score and Plan: Ondansetron, Dexamethasone and Midazolam  Airway Management Planned: LMA  Additional Equipment:   Intra-op Plan:   Post-operative Plan:   Informed Consent: I have reviewed the patients History and Physical, chart, labs and discussed the procedure including the risks, benefits and alternatives for the proposed anesthesia with the patient or authorized representative who has indicated his/her understanding and acceptance.     Dental advisory given  Plan Discussed with:   Anesthesia Plan Comments:       Anesthesia Quick Evaluation

## 2019-05-09 NOTE — Transfer of Care (Signed)
Immediate Anesthesia Transfer of Care Note  Patient: ALOHILANI YALDO  Procedure(s) Performed: KNEE ARTHROSCOPY WITH MEDIAL MENISECTOMY AND LATERAL MENISECTOMY (Right Knee)  Patient Location: PACU  Anesthesia Type:General  Level of Consciousness: awake, alert  and patient cooperative  Airway & Oxygen Therapy: Patient Spontanous Breathing  Post-op Assessment: Report given to RN and Post -op Vital signs reviewed and stable  Post vital signs: Reviewed and stable  Last Vitals:  Vitals Value Taken Time  BP    Temp    Pulse 90 05/09/19 1130  Resp 15 05/09/19 1130  SpO2 92 % 05/09/19 1130  Vitals shown include unvalidated device data.  Last Pain:  Vitals:   05/09/19 0829  TempSrc: Oral  PainSc: 5       Patients Stated Pain Goal: 9 ([id number])  Complications: No apparent anesthesia complications

## 2019-05-09 NOTE — Op Note (Signed)
05/09/2019  11:27 AM  PATIENT:  [name]  58 y.o. female  PRE-OPERATIVE DIAGNOSIS:  Torn medial and lateral meniscus right knee  POST-OPERATIVE DIAGNOSIS:  Torn medial and lateral meniscus right knee, osteoarthritis primary  Findings at surgery  1.  Severe osteoarthritis all 3 compartments  2.  Tear medial meniscus root  3.  Tear anterior horn and body lateral meniscus  4.  Mild synovitis suprapatellar pouch   PROCEDURE:  Procedure(s): KNEE ARTHROSCOPY WITH MEDIAL MENISECTOMY AND LATERAL MENISECTOMY (Right)  29880   SURGEON:  Surgeon(s) and Role:    Carole Civil, MD - Primary  Knee arthroscopy dictation  The patient was identified in the preoperative holding area using 2 approved identification mechanisms. The chart was reviewed and updated. The surgical site was confirmed as right  knee and marked with an indelible marker.  The patient was taken to the operating room for anesthesia. After successful  General  anesthesia, 2gm ancef  was used as IV antibiotics.  The patient was placed in the supine position with the (right leg ) the operative extremity in an arthroscopic leg holder and the opposite extremity in a padded leg holder.  The timeout was executed.  A lateral portal was established with an 11 blade and the scope was introduced into the joint. A diagnostic arthroscopy was performed in circumferential manner examining the entire knee joint. A medial portal was established and the diagnostic arthroscopy was repeated using a probe to palpate intra-articular structures as they were encountered.   The medial meniscus was resected using a duckbill forceps. The meniscal fragments were removed with a motorized shaver. The meniscus was balanced with a combination of a motorized shaver and a 50 ArthroCare wand until a stable rim was obtained.  The lateral meniscus was resected using a motorized shaver  We used a ArthroCare wand to control any bleeding  that we saw.  The arthroscopic pump was placed on the wash mode and any excess debris was removed from the joint using suction.  60 cc of Marcaine with epinephrine was injected through the arthroscope.  The portals were closed with 3-0 nylon suture.  A sterile bandage, Ace wrap and Cryo/Cuff was placed and the Cryo/Cuff was activated. The patient was taken to the recovery room in stable condition.  PHYSICIAN ASSISTANT:   ASSISTANTS: none   ANESTHESIA:   general  EBL:  0 mL   BLOOD ADMINISTERED:none  DRAINS: none   LOCAL MEDICATIONS USED:  MARCAINE     SPECIMEN:  No Specimen  DISPOSITION OF SPECIMEN:  N/A  COUNTS:  YES  TOURNIQUET:  * Missing tourniquet times found for documented tourniquets in log: [id number] *  DICTATION: .Dragon Dictation  PLAN OF CARE: Discharge to home after PACU  PATIENT DISPOSITION:  PACU - hemodynamically stable.   Delay start of Pharmacological VTE agent (>24hrs) due to surgical blood loss or risk of bleeding: no

## 2019-05-10 ENCOUNTER — Encounter (HOSPITAL_COMMUNITY): Payer: Self-pay | Admitting: Orthopedic Surgery

## 2019-05-15 ENCOUNTER — Ambulatory Visit (INDEPENDENT_AMBULATORY_CARE_PROVIDER_SITE_OTHER): Payer: Medicare Other | Admitting: Orthopedic Surgery

## 2019-05-15 ENCOUNTER — Other Ambulatory Visit: Payer: Self-pay

## 2019-05-15 ENCOUNTER — Encounter: Payer: Self-pay | Admitting: Orthopedic Surgery

## 2019-05-15 DIAGNOSIS — Z9889 Other specified postprocedural states: Secondary | ICD-10-CM

## 2019-05-15 NOTE — Progress Notes (Signed)
Postop appointment  Status post arthroscopy right knee she had a torn medial lateral meniscus she had osteoarthritis of the knee in all 3 compartments with synovitis  We did meniscectomies on both sides  She was concerned about swelling in her leg and came in a few days early  She does have some swelling in the leg but there are no signs of DVT her wounds look good calf is soft she is moving her leg and foot normally  We recommend that she continue ice and elevation  Start ankle pumps and quadriceps exercises  Follow-up in 2 weeks

## 2019-05-15 NOTE — Patient Instructions (Signed)
Elevate the leg   Ice the knee   Start new exercises

## 2019-05-17 ENCOUNTER — Ambulatory Visit: Payer: Medicare Other | Admitting: Orthopedic Surgery

## 2019-05-20 ENCOUNTER — Encounter (HOSPITAL_COMMUNITY): Payer: Self-pay | Admitting: Orthopedic Surgery

## 2019-05-20 ENCOUNTER — Ambulatory Visit: Payer: Medicare Other | Admitting: Orthopedic Surgery

## 2019-05-29 ENCOUNTER — Other Ambulatory Visit: Payer: Self-pay

## 2019-05-29 ENCOUNTER — Ambulatory Visit (INDEPENDENT_AMBULATORY_CARE_PROVIDER_SITE_OTHER): Payer: Medicare Other | Admitting: Orthopedic Surgery

## 2019-05-29 VITALS — BP 125/75 | HR 87 | Temp 97.2°F | Ht 64.0 in | Wt 235.0 lb

## 2019-05-29 DIAGNOSIS — M25562 Pain in left knee: Secondary | ICD-10-CM | POA: Diagnosis not present

## 2019-05-29 DIAGNOSIS — Z9889 Other specified postprocedural states: Secondary | ICD-10-CM

## 2019-05-29 DIAGNOSIS — G8929 Other chronic pain: Secondary | ICD-10-CM | POA: Diagnosis not present

## 2019-05-29 NOTE — Patient Instructions (Signed)
Exercises left knee

## 2019-05-29 NOTE — Progress Notes (Signed)
Chief Complaint  Patient presents with  . Follow-up    Recheck on right knee, DOS 05-09-19.    [name] had knee arthroscopy with medial and lateral meniscectomies for arthritis in all 3 compartments torn medial meniscal root and anterior horn and body lateral meniscal tear with synovitis she says her right knee is doing well  She does have a little drainage from the lateral portal I have asked her to place some Neosporin and keep it covered with a Band-Aid  She is regained full range of motion and strength in the right knee  New chief complaint pain left knee  Since the surgery the patient has noticed increased pain in the left knee with a loud popping sensation which occurred about a week ago and since then she has had acute pain running into the left knee intermittently with increased pain despite being on Voltaren  Examination shows tenderness around the knee joint with no effusion she has normal range of motion in the knee but it feels stable  She is ambulatory with a cane  Recommend continue the Voltaren use home exercises for 5 to 6 weeks and come back if no improvement we should meet criteria for MRI at that time  Encounter Diagnoses  Name Primary?  . S/P right knee arthroscopy 05/09/19 Yes  . Chronic pain of left knee

## 2019-06-04 ENCOUNTER — Other Ambulatory Visit: Payer: Self-pay | Admitting: Orthopaedic Surgery

## 2019-06-05 ENCOUNTER — Ambulatory Visit (INDEPENDENT_AMBULATORY_CARE_PROVIDER_SITE_OTHER): Payer: Medicare Other | Admitting: Orthopedic Surgery

## 2019-06-05 ENCOUNTER — Other Ambulatory Visit: Payer: Self-pay

## 2019-06-05 VITALS — BP 138/76 | HR 81 | Temp 97.0°F | Ht 64.0 in | Wt 232.0 lb

## 2019-06-05 DIAGNOSIS — Z9889 Other specified postprocedural states: Secondary | ICD-10-CM

## 2019-06-05 DIAGNOSIS — G8929 Other chronic pain: Secondary | ICD-10-CM

## 2019-06-05 DIAGNOSIS — M25562 Pain in left knee: Secondary | ICD-10-CM

## 2019-06-05 NOTE — Patient Instructions (Signed)
We will obtain pre-certification from the insurer and call you to schedule the study. Dr Aline Brochure will call you with the results

## 2019-06-05 NOTE — Progress Notes (Signed)
Chief Complaint  Patient presents with  . Knee Pain    Recheck on left knee.    History 58 year old female had a right knee arthroscopy did well May 09, 2019 however she noticed increased pain in the left knee on her last visit and we treated her with Voltaren she is also on oxycodone status post lumbar disc surgery years ago she has left leg and knee pain which she describes as left knee pain severe posterior radiating to the front denies any back or leg pain  Review of systems negative for numbness or tingling of the left leg  Examination does show tenderness in the left knee with no effusion her knee feels stable she says she can only bend her knee about 45 degrees cannot get it fully straight  Neurovascular exam remains intact she does have some tenderness in her lower back  Recommend MRI of the knee  Call patient with results and make further treatment recommendations  Encounter Diagnoses  Name Primary?  . S/P right knee arthroscopy 05/09/19 Yes  . Chronic pain of left knee

## 2019-06-14 ENCOUNTER — Other Ambulatory Visit: Payer: Self-pay

## 2019-06-14 ENCOUNTER — Ambulatory Visit (HOSPITAL_COMMUNITY)
Admission: RE | Admit: 2019-06-14 | Discharge: 2019-06-14 | Disposition: A | Discharge status: Home / Self Care | Payer: Medicare Other | Source: Ambulatory Visit | Attending: Orthopedic Surgery | Admitting: Orthopedic Surgery

## 2019-06-14 DIAGNOSIS — M25562 Pain in left knee: Secondary | ICD-10-CM

## 2019-06-14 DIAGNOSIS — G8929 Other chronic pain: Secondary | ICD-10-CM

## 2019-06-18 ENCOUNTER — Other Ambulatory Visit: Payer: Self-pay | Admitting: Orthopedic Surgery

## 2019-06-18 ENCOUNTER — Telehealth: Payer: Self-pay | Admitting: Orthopedic Surgery

## 2019-06-18 ENCOUNTER — Telehealth: Payer: Self-pay | Admitting: Radiology

## 2019-06-18 NOTE — Telephone Encounter (Signed)
-----   Message from Carole Civil, MD sent at 06/18/2019 10:24 AM EST ----- Schedule arthroscopy left knee partial medial meniscectomy before Christmas   results given patient has a torn medial meniscus stress reactions in the bone medially arthritis in the medial and patellofemoral compartments Baker's cyst with loose bodies in the cyst  She is agreeable to arthroscopic surgery left knee for partial medial meniscectomy

## 2019-06-18 NOTE — Telephone Encounter (Signed)
Patient states she needs an order for a wheelchair please

## 2019-06-18 NOTE — Telephone Encounter (Signed)
We want her to walk. I called her to see if she has a walker. She has a walker, but she states you told her she will be in a brace and not able to walk because she has fractures  I posted her for a SALK with medial meniscectomy, is there something I am missing? I do see she has a stress reaction,  Does she need wheel chair ?

## 2019-06-18 NOTE — Telephone Encounter (Signed)
I spoke to her to advise. She has the walker

## 2019-06-18 NOTE — Telephone Encounter (Signed)
I called her and we have chosen Dec 17th

## 2019-06-18 NOTE — Telephone Encounter (Signed)
She s confused because of the fracture she will be able to walk with walker , brace at 1 st post op visit

## 2019-06-21 NOTE — Patient Instructions (Signed)
Your procedure is scheduled on: 06/27/2019  Report to [facility] at    6:15 AM.  Call this number if you have problems the morning of surgery: [phone]   Remember:   Do not Eat or Drink after midnight   :  Take these medicines the morning of surgery with A SIP OF WATER: Buspar, Cymbalta, Prozac, Claritin and oxycodone if needed   Do not wear jewelry, make-up or nail polish.  Do not wear lotions, powders, or perfumes. You may wear deodorant.  Do not shave 48 hours prior to surgery. Men may shave face and neck.  Do not bring valuables to the hospital.  Contacts, dentures or bridgework may not be worn into surgery.  Leave suitcase in the car. After surgery it may be brought to your room.  For patients admitted to the hospital, checkout time is 11:00 AM the day of discharge.   Patients discharged the day of surgery will not be allowed to drive home.    Special Instructions: Shower using CHG night before surgery and shower the day of surgery use CHG.  Use special wash - you have one bottle of CHG for all showers.  You should use approximately 1/2 of the bottle for each shower.  Knee Arthroscopy Knee arthroscopy is a surgery to examine the inside of the knee joint and repair any damage to cartilage, surfaces, and other soft tissues around the joint. You may have this surgery if non-surgical treatment has not relieved your symptoms. Knee arthroscopy may be used to:  Repair a torn ligament or other torn tissues. Ligaments are tissues that connect bones to each other.  Remove bone fragments.  Remove a fluid-filled sac (cyst).  Treat kneecap (patella)problems.  Treat an advanced infection in the knee (septicknee). Arthroscopic surgery is done using a thin tube that has a light and camera on the end of it (arthroscope). The arthroscope is placed through a small incision, and the camera sends images to a screen in the operating room. The images are used to help perform the surgery. Tell  a health care provider about:  Any allergies you have.  All medicines you are taking, including vitamins, herbs, eye drops, creams, and over-the-counter medicines.  Any problems you or family members have had with anesthetic medicines.  Any blood disorders you have.  Any surgeries you have had.  Any medical conditions you have.  Whether you are pregnant or may be pregnant. What are the risks? Generally, this is a safe procedure. However, problems may occur, including:  Infection.  Bleeding.  Allergic reactions to medicines.  Damage to blood vessels, nerves, or tissues in the knee.  A blood clot that forms in the leg and travels to the lung (pulmonary embolism).  Failure of the surgery to relieve symptoms.  Knee stiffness. What happens before the procedure? Staying hydrated Follow instructions from your health care provider about hydration, which may include:  Up to 2 hours before the procedure - you may continue to drink clear liquids, such as water, clear fruit juice, black coffee, and plain tea. Eating and drinking restrictions Follow instructions from your health care provider about eating and drinking, which may include:  8 hours before the procedure - stop eating heavy meals or foods such as meat, fried foods, or fatty foods.  6 hours before the procedure - stop eating light meals or foods, such as toast or cereal.  6 hours before the procedure - stop drinking milk or drinks that contain milk.  2 hours before the procedure - stop drinking clear liquids. Medicines Ask your health care provider about:  Changing or stopping your regular medicines. This is especially important if you are taking diabetes medicines or blood thinners.  Taking medicines such as aspirin and ibuprofen. These medicines can thin your blood. Do not take these medicines unless your health care provider tells you to take them.  Taking over-the-counter medicines, vitamins, herbs, and  supplements. Testing  Your knee may be examined. Your health care provider may move your knee or ask you to move it in specific ways to see how much motion you have.  You may have blood tests.  You may have imaging tests, such as an X-ray, MRI, or CT scan.  You may have an electrocardiogram. This test records the electrical activity in the heart. General instructions  Do not drink alcohol unless your health care provider says that you can.  Do not use any products that contain nicotine or tobacco, such as cigarettes and e-cigarettes, for one month or more before your surgery. If you need help quitting, ask your health care provider.  Plan to have someone take you home from the hospital or clinic.  Plan to have a responsible adult care for you for at least 24 hours after you leave the hospital or clinic. This is important. What happens during the procedure?   To lower your risk of infection: ? Your health care team will wash or sanitize their hands. ? Hair may be removed from the surgical area. ? Your skin will be washed with soap.  An IV will be inserted into one of your veins.  You will be given one or more of the following: ? A medicine to help you relax (sedative). ? A medicine to numb the knee area (local anesthetic). ? A medicine to make you fall asleep (general anesthetic). ? A medicine that is injected into an area of your body to numb everything below the injection site (regional anesthetic). This may be injected into your groin or thigh.  A cuff may be placed around your upper leg to slow blood flow to your lower leg during the procedure.  Several small incisions will be made around your knee.  Your knee joint will be rinsed (flushed) and filled with a germ-free solution (sterile saline). This expands the area to allow your surgeon to see the joint more clearly.  An arthroscope will be passed through one of your incisions, into your knee joint.  Other surgical  instruments will be passed through the other incisions. Then, your surgeon will examine and repair your knee as needed.  The sterile saline will be drained from your knee, and the cuff will be removed from your upper leg.  Your incisions will be closed with adhesive strips or stitches (sutures) and covered with a bandage (dressing). The procedure may vary among health care providers and hospitals. What happens after the procedure?  Your blood pressure, heart rate, breathing rate, and blood oxygen level will be monitored until the medicines you were given have worn off.  You will be given pain medicine as needed.  You may be given medicine to lower your risk of blood clots.  You may have to wear compression stockings. These stockings help to prevent blood clots and reduce swelling in your legs.  Your health care provider will give you instructions about how much body weight you can safely support on your leg (weight-bearing restrictions). You may be given crutches or other devices  to help you move around (assistive devices).  You may be shown how to do physical therapy exercises to help you recover.  Do not drive until your health care provider approves. Summary  Knee arthroscopy is a surgery to examine or repair the inside of the knee joint.  Before the procedure, follow instructions from your health care provider about eating and drinking.  Plan to have someone take you home from the hospital or clinic. This information is not intended to replace advice given to you by your health care provider. Make sure you discuss any questions you have with your health care provider. Document Released: 06/24/2000 Document Revised: 06/09/2017 Document Reviewed: 03/30/2017 Elsevier Patient Education  [place].  Knee Arthroscopy, Care After This sheet gives you information about how to care for yourself after your procedure. Your health care provider may also give you more specific  instructions. If you have problems or questions, contact your health care provider. What can I expect after the procedure? After the procedure, it is common to have:  Soreness.  Swelling.  Pain that can be relieved by taking pain medicine. Follow these instructions at home: Incision care   Follow instructions from your health care provider about how to take care of your incisions. Make sure you: ? Wash your hands with soap and water before you change your bandage (dressing). If soap and water are not available, use hand sanitizer. ? Change your dressing as told by your health care provider. ? Leave stitches (sutures), staples, skin glue, or adhesive strips in place. These skin closures may need to stay in place for 2 weeks or longer. If adhesive strip edges start to loosen and curl up, you may trim the loose edges. Do not remove adhesive strips completely unless your health care provider tells you to do that.  Check your incision areas every day for signs of infection. Check for: ? Redness. ? More swelling or pain. ? Fluid or blood. ? Warmth. ? Pus or a bad smell. Bathing  Do not take baths, swim, or use a hot tub until your health care provider approves. Ask your health care provider if you may take showers. You may only be allowed to take sponge baths. Activity  Do not use your knee to support your body weight until your health care provider says that you can. Follow weight-bearing restrictions as told. Use crutches or other devices to help you move around (assistive devices) as directed.  Ask your health care provider what activities are safe for you during recovery, and what activities you need to avoid.  If physical therapy was prescribed, do exercises as directed. Doing exercises may help improve knee movement and flexibility (range of motion).  Do not lift anything that is heavier than 10 lb (4.5 kg), or the limit that you are told, until your health care provider says that  it is safe. Driving  Do not drive until your health care provider approves. You may be able to drive after 1-3 weeks.  Do not drive or use heavy machinery while taking prescription pain medicine. Managing pain, stiffness, and swelling   If directed, put ice on the injured area: ? Put ice in a plastic bag or use the icing device (cold therapy unit) that you were given. Follow instructions from your health care provider about how to use the icing device. ? Place a towel between your skin and the bag or between your skin and the icing device. ? Leave the ice on  for 20 minutes, 2-3 times a day.  Move your toes often to avoid stiffness and to lessen swelling.  Raise (elevate) the injured area above the level of your heart while you are sitting or lying down. If you are taking blood thinners:  Before you take any medicines that contain aspirin or NSAIDs, talk with your health care provider. These medicines increase your risk for dangerous bleeding.  Take your medicine exactly as told, at the same time every day.  Avoid activities that could cause injury or bruising, and follow instructions about how to prevent falls.  Wear a medical alert bracelet or carry a card that lists what medicines you take. General instructions  Take over-the-counter and prescription medicines only as told by your health care provider.  If you are taking prescription pain medicine, take actions to prevent or treat constipation. Your health care provider may recommend that you: ? Drink enough fluid to keep your urine pale yellow. ? Eat foods that are high in fiber, such as fresh fruits and vegetables, whole grains, and beans. ? Limit foods that are high in fat and processed sugars, such as fried or sweet foods. ? Take an over-the-counter or prescription medicines for constipation.  Do not use any products that contain nicotine or tobacco, such as cigarettes and e-cigarettes. These can delay incision or bone  healing. If you need help quitting, ask your health care provider.  Wear compression stockings as told by your health care provider. These stockings help to prevent blood clots and reduce swelling in your legs.  Keep all follow-up visits as told by your health care provider. This is important. Contact a health care provider if you:  Have a fever.  Have severe pain.  Have redness around an incision.  Have more swelling.  Have fluid or blood coming from an incision.  Notice that an incision feels warm to the touch.  Notice pus or a bad smell coming from an incision.  Notice that an incision opens up.  Develop a rash. Get help right away if you:  Have difficulty breathing.  Have shortness of breath.  Have chest pain.  Develop pain in your lower leg or at the back of your knee.  Have numbness or tingling in your lower leg or your foot. Summary  Raise (elevate) the injured area above the level of your heart while you are sitting or lying down.  To help relieve pain and swelling, put ice on your leg for 20 minutes at a time, 2-3 times a day.  If you were prescribed a blood thinner, avoid activities that could cause injury or bruising, and follow instructions about how to prevent falls.  If physical therapy was prescribed, do exercises as directed. Doing exercises may help improve range of motion. This information is not intended to replace advice given to you by your health care provider. Make sure you discuss any questions you have with your health care provider. Document Released: 01/14/2005 Document Revised: 06/09/2017 Document Reviewed: 05/10/2017 Elsevier Patient Education  2020 [place] Anesthesia, Adult, Care After This sheet gives you information about how to care for yourself after your procedure. Your health care provider may also give you more specific instructions. If you have problems or questions, contact your health care provider. What can I  expect after the procedure? After the procedure, the following side effects are common:  Pain or discomfort at the IV site.  Nausea.  Vomiting.  Sore throat.  Trouble concentrating.  Feeling  cold or chills.  Weak or tired.  Sleepiness and fatigue.  Soreness and body aches. These side effects can affect parts of the body that were not involved in surgery. Follow these instructions at home:  For at least 24 hours after the procedure:  Have a responsible adult stay with you. It is important to have someone help care for you until you are awake and alert.  Rest as needed.  Do not: ? Participate in activities in which you could fall or become injured. ? Drive. ? Use heavy machinery. ? Drink alcohol. ? Take sleeping pills or medicines that cause drowsiness. ? Make important decisions or sign legal documents. ? Take care of children on your own. Eating and drinking  Follow any instructions from your health care provider about eating or drinking restrictions.  When you feel hungry, start by eating small amounts of foods that are soft and easy to digest (bland), such as toast. Gradually return to your regular diet.  Drink enough fluid to keep your urine pale yellow.  If you vomit, rehydrate by drinking water, juice, or clear broth. General instructions  If you have sleep apnea, surgery and certain medicines can increase your risk for breathing problems. Follow instructions from your health care provider about wearing your sleep device: ? Anytime you are sleeping, including during daytime naps. ? While taking prescription pain medicines, sleeping medicines, or medicines that make you drowsy.  Return to your normal activities as told by your health care provider. Ask your health care provider what activities are safe for you.  Take over-the-counter and prescription medicines only as told by your health care provider.  If you smoke, do not smoke without supervision.  Keep  all follow-up visits as told by your health care provider. This is important. Contact a health care provider if:  You have nausea or vomiting that does not get better with medicine.  You cannot eat or drink without vomiting.  You have pain that does not get better with medicine.  You are unable to pass urine.  You develop a skin rash.  You have a fever.  You have redness around your IV site that gets worse. Get help right away if:  You have difficulty breathing.  You have chest pain.  You have blood in your urine or stool, or you vomit blood. Summary  After the procedure, it is common to have a sore throat or nausea. It is also common to feel tired.  Have a responsible adult stay with you for the first 24 hours after general anesthesia. It is important to have someone help care for you until you are awake and alert.  When you feel hungry, start by eating small amounts of foods that are soft and easy to digest (bland), such as toast. Gradually return to your regular diet.  Drink enough fluid to keep your urine pale yellow.  Return to your normal activities as told by your health care provider. Ask your health care provider what activities are safe for you. This information is not intended to replace advice given to you by your health care provider. Make sure you discuss any questions you have with your health care provider. Document Released: 10/03/2000 Document Revised: 06/30/2017 Document Reviewed: 02/10/2017 Elsevier Patient Education  2020 [employer].

## 2019-06-25 ENCOUNTER — Other Ambulatory Visit (HOSPITAL_COMMUNITY): Payer: Medicare Other

## 2019-06-25 ENCOUNTER — Other Ambulatory Visit: Payer: Self-pay

## 2019-06-25 ENCOUNTER — Encounter (HOSPITAL_COMMUNITY)
Admission: RE | Admit: 2019-06-25 | Discharge: 2019-06-25 | Disposition: A | Discharge status: Home / Self Care | Payer: Medicare Other | Source: Ambulatory Visit | Attending: Orthopedic Surgery | Admitting: Orthopedic Surgery

## 2019-06-25 ENCOUNTER — Other Ambulatory Visit (HOSPITAL_COMMUNITY)
Admission: RE | Admit: 2019-06-25 | Discharge: 2019-06-25 | Disposition: A | Discharge status: Home / Self Care | Payer: Medicare Other | Source: Ambulatory Visit | Attending: Orthopedic Surgery | Admitting: Orthopedic Surgery

## 2019-06-25 ENCOUNTER — Encounter (HOSPITAL_COMMUNITY): Payer: Self-pay

## 2019-06-25 DIAGNOSIS — Z01812 Encounter for preprocedural laboratory examination: Secondary | ICD-10-CM | POA: Insufficient documentation

## 2019-06-25 DIAGNOSIS — S83242A Other tear of medial meniscus, current injury, left knee, initial encounter: Secondary | ICD-10-CM | POA: Insufficient documentation

## 2019-06-25 LAB — CBC WITH DIFFERENTIAL/PLATELET
Abs Immature Granulocytes: 0.02 10*3/uL (ref 0.00–0.07)
Basophils Absolute: 0.1 10*3/uL (ref 0.0–0.1)
Basophils Relative: 1 %
Eosinophils Absolute: 0.2 10*3/uL (ref 0.0–0.5)
Eosinophils Relative: 2 %
HCT: 41 % (ref 36.0–46.0)
Hemoglobin: 13.4 g/dL (ref 12.0–15.0)
Immature Granulocytes: 0 %
Lymphocytes Relative: 23 %
Lymphs Abs: 1.9 10*3/uL (ref 0.7–4.0)
MCH: 31.1 pg (ref 26.0–34.0)
MCHC: 32.7 g/dL (ref 30.0–36.0)
MCV: 95.1 fL (ref 80.0–100.0)
Monocytes Absolute: 0.6 10*3/uL (ref 0.1–1.0)
Monocytes Relative: 7 %
Neutro Abs: 5.6 10*3/uL (ref 1.7–7.7)
Neutrophils Relative %: 67 %
Platelets: 267 10*3/uL (ref 150–400)
RBC: 4.31 MIL/uL (ref 3.87–5.11)
RDW: 13.4 % (ref 11.5–15.5)
WBC: 8.3 10*3/uL (ref 4.0–10.5)
nRBC: 0 % (ref 0.0–0.2)

## 2019-06-25 LAB — BASIC METABOLIC PANEL
Anion gap: 8 (ref 5–15)
BUN: 11 mg/dL (ref 6–20)
CO2: 24 mmol/L (ref 22–32)
Calcium: 9 mg/dL (ref 8.9–10.3)
Chloride: 107 mmol/L (ref 98–111)
Creatinine, Ser: 0.78 mg/dL (ref 0.44–1.00)
GFR calc Af Amer: >60 mL/min (ref >60)
GFR calc non Af Amer: >60 mL/min (ref >60)
Glucose, Bld: 101 mg/dL — ABNORMAL HIGH (ref 70–99)
Potassium: 4 mmol/L (ref 3.5–5.1)
Sodium: 139 mmol/L (ref 135–145)

## 2019-06-25 LAB — SARS CORONAVIRUS 2 (TAT 6-24 HRS): SARS Coronavirus 2: NEGATIVE

## 2019-06-26 NOTE — H&P (Signed)
History and physical for outpatient surgery  Chief complaint left knee pain\  This is a 58 year old female who had a right knee arthroscopy and did well back in October 2020.  However during the postoperative period she noticed and reported that she was having worsening severe left knee pain in the posterior aspect of the knee radiating to the front of the joint  She is status post lumbar disc surgery many years ago and takes oxycodone but reported that this pain felt more like her right knee did prior to surgery than her chronic back and leg pain  So we went ahead and did an MRI and found that she has a torn meniscus now in the left knee    Review of systems negative for numbness or tingling of the left leg, no shortness of breath or chest pain  Past Medical History:  Diagnosis Date  . Anxiety   . Arthritis   . Asthma   . Depression   . Hyperlipemia    Past Surgical History:  Procedure Laterality Date  . ABDOMINAL HYSTERECTOMY    . BACK SURGERY     pins  . BACK SURGERY  2010  . BLADDER REPAIR    . CESAREAN SECTION  1982, 1984  . COLONOSCOPY N/A 06/12/2013   Procedure: COLONOSCOPY;  Surgeon: Rogene Houston, MD;  Location: [facility];  Service: Endoscopy;  Laterality: N/A;  930-moved to 1020 Ann to notify pt  . KNEE ARTHROSCOPY WITH MEDIAL MENISECTOMY Right 05/09/2019   Procedure: KNEE ARTHROSCOPY WITH MEDIAL MENISECTOMY AND LATERAL MENISECTOMY;  Surgeon: Carole Civil, MD;  Location: [facility];  Service: Orthopedics;  Laterality: Right;   Family History  Problem Relation Age of Onset  . Colon cancer Other    Social History   Tobacco Use  . Smoking status: Former Smoker    Packs/day: 0.50    Years: 20.00    Pack years: 10.00    Types: Cigarettes    Quit date: 04/02/2019    Years since quitting: 0.2  . Smokeless tobacco: Never Used  Substance Use Topics  . Alcohol use: No    Alcohol/week: 0.0 standard drinks  . Drug use: No   Current Outpatient Medications   Medication Instructions  . albuterol (VENTOLIN HFA) 108 (90 Base) MCG/ACT inhaler 1-2 puffs, Inhalation, Every 6 hours PRN  . amphetamine-dextroamphetamine (ADDERALL) 20 MG tablet 20 mg, Oral, Daily  . busPIRone (BUSPAR) 5 mg, Oral, 2 times daily  . diclofenac (VOLTAREN) 75 MG EC tablet TAKE  (1)  TABLET TWICE A DAY WITH MEALS (BREAKFAST AND SUPPER)  . dicyclomine (BENTYL) 10 MG capsule TAKE  (1)  CAPSULE THREE TIMES DAILY BEFORE MEALS.  (TAKE AT [place])  . DULoxetine (CYMBALTA) 40 mg, Oral, Daily, Takes along with 60 mg tab to equal 90 mg.  . estradiol (ESTRACE) 2 MG tablet TAKE ONE TABLET AT BEDTIME  . FLUoxetine (PROZAC) 40 mg, Oral, Daily  . ipratropium (ATROVENT) 0.5 mg, Nebulization, Every 4 hours PRN  . loratadine (CLARITIN) 10 mg, Oral, Daily  . naproxen (NAPROSYN) 500 mg, Oral, 2 times daily with meals  . oxyCODONE (OXY IR/ROXICODONE) 5 mg, Oral, Every 4 hours PRN  . pravastatin (PRAVACHOL) 40 mg, Daily at bedtime  . traZODone (DESYREL) 200 mg, Oral, Daily at bedtime  . Vitamin D3 2,000 Units, Oral, Daily     On her last visit to the office she exhibited stable vital signs  She is obese but her appearance  is otherwise normal she is oriented to person place and time she has a normal mood and a flat affect her gait is marked by noticeable limp favoring the left side  The left knee Examination does show tenderness in the left knee with no effusion her knee feels stable she says she can only bend her knee about 45 degrees cannot get it fully straight  Right knee seems to have improved after surgery with return of functional range of motion no instability normal strength skin looks good portal sites clean no swelling neurovascular exam is intact  MRI scan findings   1. Almost complete radial tear of the root of the posterior horn of the medial meniscus with secondary meniscal peripheral extrusion and secondary stress reactions in the medial femoral condyle  and medial tibial plateau. 2. Large joint effusion with leaking Baker's cyst. 3. 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     Electronically Signed   By: Lorriane Shire M.D.   On: 06/14/2019 16:52   The procedure has been fully reviewed with the patient; The risks and benefits of surgery have been discussed and explained and understood. Alternative treatment has also been reviewed, questions were encouraged and answered. The postoperative plan is also been reviewed.  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

## 2019-06-27 ENCOUNTER — Ambulatory Visit (HOSPITAL_COMMUNITY)
Admission: RE | Admit: 2019-06-27 | Discharge: 2019-06-27 | Disposition: A | Discharge status: Home / Self Care | Payer: Medicare Other | Attending: Orthopedic Surgery | Admitting: Orthopedic Surgery

## 2019-06-27 ENCOUNTER — Ambulatory Visit (HOSPITAL_COMMUNITY): Payer: Medicare Other | Admitting: Anesthesiology

## 2019-06-27 ENCOUNTER — Encounter (HOSPITAL_COMMUNITY)
Admission: RE | Disposition: A | Discharge status: Home / Self Care | Payer: Self-pay | Source: Home / Self Care | Attending: Orthopedic Surgery

## 2019-06-27 DIAGNOSIS — Z79899 Other long term (current) drug therapy: Secondary | ICD-10-CM | POA: Insufficient documentation

## 2019-06-27 DIAGNOSIS — M23322 Other meniscus derangements, posterior horn of medial meniscus, left knee: Secondary | ICD-10-CM

## 2019-06-27 DIAGNOSIS — Z791 Long term (current) use of non-steroidal anti-inflammatories (NSAID): Secondary | ICD-10-CM | POA: Insufficient documentation

## 2019-06-27 DIAGNOSIS — S83242A Other tear of medial meniscus, current injury, left knee, initial encounter: Secondary | ICD-10-CM | POA: Insufficient documentation

## 2019-06-27 DIAGNOSIS — M1712 Unilateral primary osteoarthritis, left knee: Secondary | ICD-10-CM | POA: Diagnosis not present

## 2019-06-27 DIAGNOSIS — E785 Hyperlipidemia, unspecified: Secondary | ICD-10-CM | POA: Diagnosis not present

## 2019-06-27 DIAGNOSIS — Z79890 Hormone replacement therapy (postmenopausal): Secondary | ICD-10-CM | POA: Insufficient documentation

## 2019-06-27 DIAGNOSIS — F329 Major depressive disorder, single episode, unspecified: Secondary | ICD-10-CM | POA: Diagnosis not present

## 2019-06-27 DIAGNOSIS — J45909 Unspecified asthma, uncomplicated: Secondary | ICD-10-CM | POA: Diagnosis not present

## 2019-06-27 DIAGNOSIS — Z87891 Personal history of nicotine dependence: Secondary | ICD-10-CM | POA: Insufficient documentation

## 2019-06-27 DIAGNOSIS — X58XXXA Exposure to other specified factors, initial encounter: Secondary | ICD-10-CM | POA: Diagnosis not present

## 2019-06-27 DIAGNOSIS — F419 Anxiety disorder, unspecified: Secondary | ICD-10-CM | POA: Insufficient documentation

## 2019-06-27 HISTORY — PX: KNEE ARTHROSCOPY WITH MEDIAL MENISECTOMY: SHX5651

## 2019-06-27 SURGERY — ARTHROSCOPY, KNEE, WITH MEDIAL MENISCECTOMY
Anesthesia: General | Site: Knee | Laterality: Left

## 2019-06-27 MED ORDER — PROPOFOL 10 MG/ML IV BOLUS
INTRAVENOUS | Status: AC
  Filled 2019-06-27: qty 20

## 2019-06-27 MED ORDER — ONDANSETRON HCL 4 MG/2ML IJ SOLN
4.0000 mg | Freq: Once | INTRAMUSCULAR | Status: AC
  Administered 2019-06-27: 4 mg via INTRAVENOUS

## 2019-06-27 MED ORDER — ONDANSETRON HCL 4 MG/2ML IJ SOLN
INTRAMUSCULAR | Status: AC
  Filled 2019-06-27: qty 2

## 2019-06-27 MED ORDER — BUPIVACAINE-EPINEPHRINE (PF) 0.5% -1:200000 IJ SOLN
INTRAMUSCULAR | Status: AC
  Filled 2019-06-27: qty 60

## 2019-06-27 MED ORDER — ONDANSETRON HCL 4 MG/2ML IJ SOLN
INTRAMUSCULAR | Status: DC | PRN
  Administered 2019-06-27: 4 mg via INTRAVENOUS

## 2019-06-27 MED ORDER — FENTANYL CITRATE (PF) 100 MCG/2ML IJ SOLN
INTRAMUSCULAR | Status: DC | PRN
  Administered 2019-06-27: 50 ug via INTRAVENOUS
  Administered 2019-06-27: 100 ug via INTRAVENOUS
  Administered 2019-06-27 (×2): 25 ug via INTRAVENOUS

## 2019-06-27 MED ORDER — EPINEPHRINE PF 1 MG/ML IJ SOLN
INTRAMUSCULAR | Status: AC
  Filled 2019-06-27: qty 4

## 2019-06-27 MED ORDER — LACTATED RINGERS IV SOLN: INTRAVENOUS | Status: DC

## 2019-06-27 MED ORDER — HYDROCODONE-ACETAMINOPHEN 7.5-325 MG PO TABS
1.0000 | ORAL_TABLET | Freq: Once | ORAL | Status: DC | PRN
  Filled 2019-06-27: qty 1

## 2019-06-27 MED ORDER — CEFAZOLIN SODIUM-DEXTROSE 2-4 GM/100ML-% IV SOLN
2.0000 g | INTRAVENOUS | Status: AC
  Administered 2019-06-27: 2 g via INTRAVENOUS
  Filled 2019-06-27: qty 100

## 2019-06-27 MED ORDER — MIDAZOLAM HCL 2 MG/2ML IJ SOLN: 0.5000 mg | Freq: Once | INTRAMUSCULAR | Status: DC | PRN

## 2019-06-27 MED ORDER — FENTANYL CITRATE (PF) 100 MCG/2ML IJ SOLN
INTRAMUSCULAR | Status: AC
  Filled 2019-06-27: qty 2

## 2019-06-27 MED ORDER — BUPIVACAINE-EPINEPHRINE (PF) 0.5% -1:200000 IJ SOLN
INTRAMUSCULAR | Status: DC | PRN
  Administered 2019-06-27: 60 mL via PERINEURAL

## 2019-06-27 MED ORDER — SODIUM CHLORIDE 0.9 % IR SOLN
Status: DC | PRN
  Administered 2019-06-27 (×4): 3000 mL

## 2019-06-27 MED ORDER — HYDROCODONE-ACETAMINOPHEN 7.5-325 MG PO TABS
1.0000 | ORAL_TABLET | Freq: Once | ORAL | Status: AC
  Administered 2019-06-27: 09:00:00 1 via ORAL

## 2019-06-27 MED ORDER — CHLORHEXIDINE GLUCONATE 4 % EX LIQD: 60.0000 mL | Freq: Once | CUTANEOUS | Status: DC

## 2019-06-27 MED ORDER — DEXAMETHASONE SODIUM PHOSPHATE 10 MG/ML IJ SOLN
INTRAMUSCULAR | Status: DC | PRN
  Administered 2019-06-27: 6 mg via INTRAVENOUS

## 2019-06-27 MED ORDER — DEXAMETHASONE SODIUM PHOSPHATE 10 MG/ML IJ SOLN
INTRAMUSCULAR | Status: AC
  Filled 2019-06-27: qty 1

## 2019-06-27 MED ORDER — IBUPROFEN 400 MG PO TABS
400.0000 mg | ORAL_TABLET | Freq: Once | ORAL | Status: AC
  Administered 2019-06-27: 400 mg via ORAL

## 2019-06-27 MED ORDER — SODIUM CHLORIDE 0.9 % IR SOLN
Status: DC | PRN
  Administered 2019-06-27: 1000 mL

## 2019-06-27 MED ORDER — HYDROMORPHONE HCL 1 MG/ML IJ SOLN: 0.2500 mg | INTRAMUSCULAR | Status: DC | PRN

## 2019-06-27 MED ORDER — LIDOCAINE HCL (PF) 0.5 % IJ SOLN
INTRAMUSCULAR | Status: AC
  Filled 2019-06-27: qty 50

## 2019-06-27 MED ORDER — PROMETHAZINE HCL 25 MG/ML IJ SOLN: 6.2500 mg | INTRAMUSCULAR | Status: DC | PRN

## 2019-06-27 MED ORDER — PROPOFOL 10 MG/ML IV BOLUS
INTRAVENOUS | Status: DC | PRN
  Administered 2019-06-27: 200 mg via INTRAVENOUS
  Administered 2019-06-27: 50 mg via INTRAVENOUS

## 2019-06-27 MED ORDER — IBUPROFEN 400 MG PO TABS
ORAL_TABLET | ORAL | Status: AC
  Filled 2019-06-27: qty 1

## 2019-06-27 SURGICAL SUPPLY — 59 items
APL PRP STRL LF DISP 70% ISPRP (MISCELLANEOUS) ×1
ARTHROWAND PARAGON T2 (SURGICAL WAND)
BANDAGE ELASTIC 6 VELCRO NS (GAUZE/BANDAGES/DRESSINGS) ×2 IMPLANT
BLADE AGGRESSIVE PLUS 4.0 (BLADE) ×3 IMPLANT
BLADE SURG SZ11 CARB STEEL (BLADE) ×3 IMPLANT
BNDG CMPR STD VLCR NS LF 5.8X6 (GAUZE/BANDAGES/DRESSINGS) ×1
BNDG ELASTIC 6X5.8 VLCR NS LF (GAUZE/BANDAGES/DRESSINGS) ×3 IMPLANT
CHLORAPREP W/TINT 26 (MISCELLANEOUS) ×3 IMPLANT
CLOTH BEACON ORANGE TIMEOUT ST (SAFETY) ×3 IMPLANT
COOLER CRYO IC GRAV AND TUBE (ORTHOPEDIC SUPPLIES) ×3 IMPLANT
COVER WAND RF STERILE (DRAPES) ×3 IMPLANT
CUFF CRYO KNEE18X23 MED (MISCELLANEOUS) ×2 IMPLANT
CUFF TOURN SGL QUICK 34 (TOURNIQUET CUFF) ×3
CUFF TRNQT CYL 34X4.125X (TOURNIQUET CUFF) IMPLANT
DECANTER SPIKE VIAL GLASS SM (MISCELLANEOUS) ×6 IMPLANT
DRAPE HALF SHEET 40X57 (DRAPES) ×2 IMPLANT
DRSG XEROFORM 1X8 (GAUZE/BANDAGES/DRESSINGS) ×2 IMPLANT
GAUZE 4X4 16PLY RFD (DISPOSABLE) ×3 IMPLANT
GAUZE SPONGE 4X4 12PLY STRL (GAUZE/BANDAGES/DRESSINGS) ×3 IMPLANT
GAUZE SPONGE 4X4 16PLY XRAY LF (GAUZE/BANDAGES/DRESSINGS) ×3 IMPLANT
GAUZE XEROFORM 5X9 LF (GAUZE/BANDAGES/DRESSINGS) ×3 IMPLANT
GLOVE BIOGEL PI IND STRL 7.0 (GLOVE) ×2 IMPLANT
GLOVE BIOGEL PI INDICATOR 7.0 (GLOVE) ×4
GLOVE ECLIPSE 6.5 STRL STRAW (GLOVE) ×2 IMPLANT
GLOVE SKINSENSE NS SZ8.0 LF (GLOVE) ×2
GLOVE SKINSENSE STRL SZ8.0 LF (GLOVE) ×1 IMPLANT
GLOVE SS N UNI LF 8.5 STRL (GLOVE) ×3 IMPLANT
GOWN STRL REUS W/ TWL LRG LVL3 (GOWN DISPOSABLE) ×1 IMPLANT
GOWN STRL REUS W/TWL LRG LVL3 (GOWN DISPOSABLE) ×3
GOWN STRL REUS W/TWL XL LVL3 (GOWN DISPOSABLE) ×3 IMPLANT
IV NS IRRIG 3000ML ARTHROMATIC (IV SOLUTION) ×10 IMPLANT
KIT BLADEGUARD II DBL (SET/KITS/TRAYS/PACK) ×3 IMPLANT
KIT TURNOVER CYSTO (KITS) ×3 IMPLANT
MANIFOLD NEPTUNE II (INSTRUMENTS) ×3 IMPLANT
MARKER SKIN DUAL TIP RULER LAB (MISCELLANEOUS) ×3 IMPLANT
NDL HYPO 18GX1.5 BLUNT FILL (NEEDLE) ×1 IMPLANT
NDL HYPO 21X1.5 SAFETY (NEEDLE) ×1 IMPLANT
NDL SPNL 18GX3.5 QUINCKE PK (NEEDLE) ×1 IMPLANT
NEEDLE HYPO 18GX1.5 BLUNT FILL (NEEDLE) ×3 IMPLANT
NEEDLE HYPO 21X1.5 SAFETY (NEEDLE) ×3 IMPLANT
NEEDLE SPNL 18GX3.5 QUINCKE PK (NEEDLE) ×3 IMPLANT
NS IRRIG 1000ML POUR BTL (IV SOLUTION) ×3 IMPLANT
PACK ARTHRO LIMB DRAPE STRL (MISCELLANEOUS) ×3 IMPLANT
PAD ABD 5X9 TENDERSORB (GAUZE/BANDAGES/DRESSINGS) ×3 IMPLANT
PAD ARMBOARD 7.5X6 YLW CONV (MISCELLANEOUS) ×3 IMPLANT
PADDING CAST COTTON 6X4 STRL (CAST SUPPLIES) ×3 IMPLANT
PADDING WEBRIL 6 STERILE (GAUZE/BANDAGES/DRESSINGS) ×2 IMPLANT
PROBE BIPOLAR 50 DEGREE SUCT (MISCELLANEOUS) ×2 IMPLANT
PROBE BIPOLAR ATHRO 135MM 90D (MISCELLANEOUS) IMPLANT
SET ARTHROSCOPY INST (INSTRUMENTS) ×3 IMPLANT
SET BASIN LINEN APH (SET/KITS/TRAYS/PACK) ×3 IMPLANT
SUT ETHILON 3 0 FSL (SUTURE) ×3 IMPLANT
SYR 10ML LL (SYRINGE) ×3 IMPLANT
SYR 30ML LL (SYRINGE) ×3 IMPLANT
TOWEL OR 17X26 4PK STRL BLUE (TOWEL DISPOSABLE) ×2 IMPLANT
TUBE CONNECTING 12'X1/4 (SUCTIONS) ×2
TUBE CONNECTING 12X1/4 (SUCTIONS) ×4 IMPLANT
TUBING ARTHRO INFLOW-ONLY STRL (TUBING) ×3 IMPLANT
WAND ARTHRO PARAGON T2 (SURGICAL WAND) IMPLANT

## 2019-06-27 NOTE — Anesthesia Postprocedure Evaluation (Signed)
Anesthesia Post Note  Patient: [name]  Procedure(s) Performed: KNEE ARTHROSCOPY WITH MEDIAL MENISCECTOMY (Left Knee)  Patient location during evaluation: PACU Anesthesia Type: General Level of consciousness: awake and alert and patient cooperative Pain management: satisfactory to patient Respiratory status: spontaneous breathing Cardiovascular status: stable Postop Assessment: no apparent nausea or vomiting Anesthetic complications: no     Last Vitals:  Vitals:   06/27/19 0845 06/27/19 0900  BP: 107/69 107/72  Pulse: 72 73  Resp: 15 17  Temp:    SpO2: 95% 96%    Last Pain:  Vitals:   06/27/19 0830  PainSc: Asleep                 [name]

## 2019-06-27 NOTE — H&P (Signed)
History and physical for outpatient surgery  Chief complaint left knee pain\  This is a 58 year old female who had a right knee arthroscopy and did well back in October 2020.  However during the postoperative period she noticed and reported that she was having worsening severe left knee pain in the posterior aspect of the knee radiating to the front of the joint  She is status post lumbar disc surgery many years ago and takes oxycodone but reported that this pain felt more like her right knee did prior to surgery than her chronic back and leg pain  So we went ahead and did an MRI and found that she has a torn meniscus now in the left knee    Review of systems negative for numbness or tingling of the left leg, no shortness of breath or chest pain  Past Medical History:  Diagnosis Date  . Anxiety   . Arthritis   . Asthma   . Depression   . Hyperlipemia    Past Surgical History:  Procedure Laterality Date  . ABDOMINAL HYSTERECTOMY    . BACK SURGERY     pins  . BACK SURGERY  2010  . BLADDER REPAIR    . CESAREAN SECTION  1982, 1984  . COLONOSCOPY N/A 06/12/2013   Procedure: COLONOSCOPY;  Surgeon: Rogene Houston, MD;  Location: [facility];  Service: Endoscopy;  Laterality: N/A;  930-moved to 1020 Ann to notify pt  . KNEE ARTHROSCOPY WITH MEDIAL MENISECTOMY Right 05/09/2019   Procedure: KNEE ARTHROSCOPY WITH MEDIAL MENISECTOMY AND LATERAL MENISECTOMY;  Surgeon: Carole Civil, MD;  Location: [facility];  Service: Orthopedics;  Laterality: Right;   Family History  Problem Relation Age of Onset  . Colon cancer Other    Social History   Tobacco Use  . Smoking status: Former Smoker    Packs/day: 0.50    Years: 20.00    Pack years: 10.00    Types: Cigarettes    Quit date: 04/02/2019    Years since quitting: 0.2  . Smokeless tobacco: Never Used  Substance Use Topics  . Alcohol use: No    Alcohol/week: 0.0 standard drinks  . Drug use: No   Current Outpatient Medications   Medication Instructions  . albuterol (VENTOLIN HFA) 108 (90 Base) MCG/ACT inhaler 1-2 puffs, Inhalation, Every 6 hours PRN  . amphetamine-dextroamphetamine (ADDERALL) 20 MG tablet 20 mg, Oral, Daily  . busPIRone (BUSPAR) 5 mg, Oral, 2 times daily  . diclofenac (VOLTAREN) 75 MG EC tablet TAKE  (1)  TABLET TWICE A DAY WITH MEALS (BREAKFAST AND SUPPER)  . dicyclomine (BENTYL) 10 MG capsule TAKE  (1)  CAPSULE THREE TIMES DAILY BEFORE MEALS.  (TAKE AT [place])  . DULoxetine (CYMBALTA) 40 mg, Oral, Daily, Takes along with 60 mg tab to equal 90 mg.  . estradiol (ESTRACE) 2 MG tablet TAKE ONE TABLET AT BEDTIME  . FLUoxetine (PROZAC) 40 mg, Oral, Daily  . ipratropium (ATROVENT) 0.5 mg, Nebulization, Every 4 hours PRN  . loratadine (CLARITIN) 10 mg, Oral, Daily  . naproxen (NAPROSYN) 500 mg, Oral, 2 times daily with meals  . oxyCODONE (OXY IR/ROXICODONE) 5 mg, Oral, Every 4 hours PRN  . pravastatin (PRAVACHOL) 40 mg, Daily at bedtime  . traZODone (DESYREL) 200 mg, Oral, Daily at bedtime  . Vitamin D3 2,000 Units, Oral, Daily     On her last visit to the office she exhibited stable vital signs  She is obese but her appearance  is otherwise normal she is oriented to person place and time she has a normal mood and a flat affect her gait is marked by noticeable limp favoring the left side  The left knee Examination does show tenderness in the left knee with no effusion her knee feels stable she says she can only bend her knee about 45 degrees cannot get it fully straight  Right knee seems to have improved after surgery with return of functional range of motion no instability normal strength skin looks good portal sites clean no swelling neurovascular exam is intact  MRI scan findings   1. Almost complete radial tear of the root of the posterior horn of the medial meniscus with secondary meniscal peripheral extrusion and secondary stress reactions in the medial femoral condyle  and medial tibial plateau. 2. Large joint effusion with leaking Baker's cyst. 3. 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     Electronically Signed   By: Lorriane Shire M.D.   On: 06/14/2019 16:52   The procedure has been fully reviewed with the patient; The risks and benefits of surgery have been discussed and explained and understood. Alternative treatment has also been reviewed, questions were encouraged and answered. The postoperative plan is also been reviewed.  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

## 2019-06-27 NOTE — Anesthesia Procedure Notes (Signed)
Procedure Name: LMA Insertion Date/Time: 06/27/2019 7:38 AM Performed by: Vista Deck, CRNA Pre-anesthesia Checklist: Patient identified, Patient being monitored, Emergency Drugs available, Timeout performed and Suction available Patient Re-evaluated:Patient Re-evaluated prior to induction Oxygen Delivery Method: Circle System Utilized Preoxygenation: Pre-oxygenation with 100% oxygen Induction Type: IV induction Ventilation: Mask ventilation without difficulty LMA: LMA inserted LMA Size: 4.0 Number of attempts: 1 Placement Confirmation: positive ETCO2 and breath sounds checked- equal and bilateral Tube secured with: Tape Dental Injury: Teeth and Oropharynx as per pre-operative assessment

## 2019-06-27 NOTE — Interval H&P Note (Signed)
History and Physical Interval Note:  06/27/2019 7:20 AM  [name]  has presented today for surgery, with the diagnosis of Torn medial meniscus left knee.  The various methods of treatment have been discussed with the patient and family. After consideration of risks, benefits and other options for treatment, the patient has consented to  Procedure(s): KNEE ARTHROSCOPY WITH MEDIAL MENISCECTOMY (Left) as a surgical intervention.  The patient's history has been reviewed, patient examined, no change in status, stable for surgery.  I have reviewed the patient's chart and labs.  Questions were answered to the patient's satisfaction.     Arther Abbott

## 2019-06-27 NOTE — Transfer of Care (Addendum)
Immediate Anesthesia Transfer of Care Note  Patient: [name]  Procedure(s) Performed: KNEE ARTHROSCOPY WITH MEDIAL MENISCECTOMY (Left Knee)  Patient Location: PACU  Anesthesia Type:General  Level of Consciousness: awake, alert  and patient cooperative  Airway & Oxygen Therapy: Patient Spontanous Breathing  Post-op Assessment: Report given to RN and Post -op Vital signs reviewed and stable  Post vital signs: Reviewed and stable  Last Vitals:  Vitals Value Taken Time  BP    Temp 98   Pulse 83 06/27/19 0827  Resp 9 06/27/19 0827  SpO2 92 % 06/27/19 0827  Vitals shown include unvalidated device data.  Last Pain:  Vitals:   06/27/19 0640  PainSc: 9          Complications: No apparent anesthesia complications

## 2019-06-27 NOTE — Brief Op Note (Signed)
06/27/2019  8:21 AM  PATIENT:  [name]  58 y.o. female  PRE-OPERATIVE DIAGNOSIS:  Torn medial meniscus left knee  POST-OPERATIVE DIAGNOSIS:  Torn medial meniscus left knee  PROCEDURE:  Procedure(s): KNEE ARTHROSCOPY WITH MEDIAL MENISCECTOMY (Left)  SURGEON:  Surgeon(s) and Role:    Carole Civil, MD - Primary  06/27/2019  8:23 AM  Knee arthroscopy dictation Operative findings  Medial compartment grade 2 and 3 diffuse chondral changes with the posterior horn root tear which is radial  ACL PCL intact osteophyte ACL attachment  Lateral compartment normal  Grade 3 diffuse chondral changes trochlea grade 2 chondral changes patella   The patient was identified in the preoperative holding area using 2 approved identification mechanisms. The chart was reviewed and updated. The surgical site was confirmed as left knee and marked with an indelible marker.  The patient was taken to the operating room for anesthesia. After successful general anesthesia, Ancef was used as IV antibiotics.  The patient was placed in the supine position with the (left) the operative extremity in an arthroscopic leg holder and the opposite extremity in a padded leg holder.  The timeout was executed.  A lateral portal was established with an 11 blade and the scope was introduced into the joint. A diagnostic arthroscopy was performed in circumferential manner examining the entire knee joint. A medial portal was established and the diagnostic arthroscopy was repeated using a probe to palpate intra-articular structures as they were encountered.    The medial meniscus was resected using a duckbill forceps. The meniscal fragments were removed with a motorized shaver. The meniscus was balanced with a combination of a motorized shaver and a 50 ArthroCare wand until a stable rim was obtained.  Chondroplasty was also done on the medial femoral condyle.  We did place the shaver blade into the  lateral compartment to remove some degenerative fraying of the lateral meniscus but it was so minimal that it was not included as part of a lateral meniscectomy  The arthroscopic pump was placed on the wash mode and any excess debris was removed from the joint using suction.  60 cc of Marcaine with epinephrine was injected through the arthroscope.  The portals were closed with 3-0 nylon suture.  A sterile bandage, Ace wrap and Cryo/Cuff was placed and the Cryo/Cuff was activated. The patient was taken to the recovery room in stable condition.    PHYSICIAN ASSISTANT:   ASSISTANTS: none   ANESTHESIA:   general  EBL:  0  BLOOD ADMINISTERED:none  DRAINS: none   LOCAL MEDICATIONS USED: Half percent Sensorcaine with epinephrine 60 cc  SPECIMEN:  No Specimen  DISPOSITION OF SPECIMEN:  N/A  COUNTS:  YES  TOURNIQUET:  * Missing tourniquet times found for documented tourniquets in log: [id number] *  DICTATION: .Dragon Dictation  PLAN OF CARE: Discharge to home after PACU  PATIENT DISPOSITION:  PACU - hemodynamically stable.   Delay start of Pharmacological VTE agent (>24hrs) due to surgical blood loss or risk of bleeding: not applicable

## 2019-06-27 NOTE — Op Note (Signed)
06/27/2019  8:21 AM  PATIENT:  [name]  58 y.o. female  PRE-OPERATIVE DIAGNOSIS:  Torn medial meniscus left knee  POST-OPERATIVE DIAGNOSIS:  Torn medial meniscus left knee  PROCEDURE:  Procedure(s): KNEE ARTHROSCOPY WITH MEDIAL MENISCECTOMY (Left)  SURGEON:  Surgeon(s) and Role:    Carole Civil, MD - Primary  06/27/2019  8:23 AM  Knee arthroscopy dictation Operative findings  Medial compartment grade 2 and 3 diffuse chondral changes with the posterior horn root tear which is radial  ACL PCL intact osteophyte ACL attachment  Lateral compartment normal  Grade 3 diffuse chondral changes trochlea grade 2 chondral changes patella   The patient was identified in the preoperative holding area using 2 approved identification mechanisms. The chart was reviewed and updated. The surgical site was confirmed as left knee and marked with an indelible marker.  The patient was taken to the operating room for anesthesia. After successful general anesthesia, Ancef was used as IV antibiotics.  The patient was placed in the supine position with the (left) the operative extremity in an arthroscopic leg holder and the opposite extremity in a padded leg holder.  The timeout was executed.  A lateral portal was established with an 11 blade and the scope was introduced into the joint. A diagnostic arthroscopy was performed in circumferential manner examining the entire knee joint. A medial portal was established and the diagnostic arthroscopy was repeated using a probe to palpate intra-articular structures as they were encountered.    The medial meniscus was resected using a duckbill forceps. The meniscal fragments were removed with a motorized shaver. The meniscus was balanced with a combination of a motorized shaver and a 50 ArthroCare wand until a stable rim was obtained.  Chondroplasty was also done on the medial femoral condyle.  We did place the shaver blade into the  lateral compartment to remove some degenerative fraying of the lateral meniscus but it was so minimal that it was not included as part of a lateral meniscectomy  The arthroscopic pump was placed on the wash mode and any excess debris was removed from the joint using suction.  60 cc of Marcaine with epinephrine was injected through the arthroscope.  The portals were closed with 3-0 nylon suture.  A sterile bandage, Ace wrap and Cryo/Cuff was placed and the Cryo/Cuff was activated. The patient was taken to the recovery room in stable condition.    PHYSICIAN ASSISTANT:   ASSISTANTS: none   ANESTHESIA:   general  EBL:  0  BLOOD ADMINISTERED:none  DRAINS: none   LOCAL MEDICATIONS USED: Half percent Sensorcaine with epinephrine 60 cc  SPECIMEN:  No Specimen  DISPOSITION OF SPECIMEN:  N/A  COUNTS:  YES  TOURNIQUET:  * Missing tourniquet times found for documented tourniquets in log: [id number] *  DICTATION: .Dragon Dictation  PLAN OF CARE: Discharge to home after PACU  PATIENT DISPOSITION:  PACU - hemodynamically stable.   Delay start of Pharmacological VTE agent (>24hrs) due to surgical blood loss or risk of bleeding: not applicable

## 2019-06-27 NOTE — Anesthesia Preprocedure Evaluation (Signed)
Anesthesia Evaluation  Patient identified by MRN, date of birth, ID band Patient awake    Reviewed: Allergy & Precautions, NPO status , Patient's Chart, lab work & pertinent test results  Airway Mallampati: I  TM Distance: >3 FB Neck ROM: Full    Dental no notable dental hx. (+) Edentulous Upper, Edentulous Lower   Pulmonary asthma , former smoker,    Pulmonary exam normal breath sounds clear to auscultation       Cardiovascular Exercise Tolerance: Good negative cardio ROS Normal cardiovascular examI Rhythm:Regular Rate:Normal  Denies known cardiac issues -s/p recent R KS , here for L KS States ET limited by knees    Neuro/Psych Anxiety Depression negative neurological ROS  negative psych ROS   GI/Hepatic negative GI ROS, Neg liver ROS,   Endo/Other  negative endocrine ROS  Renal/GU negative Renal ROS  negative genitourinary   Musculoskeletal  (+) Arthritis ,   Abdominal   Peds negative pediatric ROS (+)  Hematology negative hematology ROS (+)   Anesthesia Other Findings   Reproductive/Obstetrics negative OB ROS                             Anesthesia Physical Anesthesia Plan  ASA: II  Anesthesia Plan: General   Post-op Pain Management:    Induction: Intravenous  PONV Risk Score and Plan: 3 and Ondansetron, Dexamethasone, Midazolam and Treatment may vary due to age or medical condition  Airway Management Planned: LMA and Oral ETT  Additional Equipment:   Intra-op Plan:   Post-operative Plan:   Informed Consent: I have reviewed the patients History and Physical, chart, labs and discussed the procedure including the risks, benefits and alternatives for the proposed anesthesia with the patient or authorized representative who has indicated his/her understanding and acceptance.     Dental advisory given  Plan Discussed with: CRNA  Anesthesia Plan Comments: (Plan Full PPE  use  Plan GA (LMA) with GETA as needed d/w pt -WTP with same after Q&A)        Anesthesia Quick Evaluation

## 2019-06-27 NOTE — Progress Notes (Signed)
Ecchymosis assessed right dorsal hand from previous Intravenous access attempt is covered with 2x2 and coban.  Patient able to move all metacarpals right hand, no complaints of pain or numbness.

## 2019-06-27 NOTE — Discharge Instructions (Signed)

## 2019-06-27 NOTE — Progress Notes (Signed)
Please excuse [name] from work today June 27, 2019 until after June 28, 2019 at 10:00am. 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

## 2019-06-27 NOTE — Progress Notes (Signed)
Instructed on incentive spirometer. 1500 ml obtained. Tolerated well. IS placed in belongings bag.

## 2019-06-28 ENCOUNTER — Emergency Department (HOSPITAL_COMMUNITY)
Admission: EM | Admit: 2019-06-28 | Discharge: 2019-06-28 | Disposition: A | Discharge status: Home / Self Care | Payer: Medicare Other | Attending: Emergency Medicine | Admitting: Emergency Medicine

## 2019-06-28 ENCOUNTER — Other Ambulatory Visit: Payer: Self-pay

## 2019-06-28 DIAGNOSIS — Z87891 Personal history of nicotine dependence: Secondary | ICD-10-CM | POA: Insufficient documentation

## 2019-06-28 DIAGNOSIS — M25562 Pain in left knee: Secondary | ICD-10-CM

## 2019-06-28 DIAGNOSIS — G8918 Other acute postprocedural pain: Secondary | ICD-10-CM | POA: Insufficient documentation

## 2019-06-28 DIAGNOSIS — J45909 Unspecified asthma, uncomplicated: Secondary | ICD-10-CM | POA: Insufficient documentation

## 2019-06-28 MED ORDER — ONDANSETRON 4 MG PO TBDP
4.0000 mg | ORAL_TABLET | Freq: Once | ORAL | Status: AC
  Administered 2019-06-28: 4 mg via ORAL
  Filled 2019-06-28: qty 1

## 2019-06-28 MED ORDER — HYDROMORPHONE HCL 2 MG/ML IJ SOLN
2.0000 mg | Freq: Once | INTRAMUSCULAR | Status: AC
  Administered 2019-06-28: 2 mg via INTRAMUSCULAR
  Filled 2019-06-28: qty 1

## 2019-06-28 NOTE — ED Notes (Signed)
Dr Aline Brochure performed knee surgery yesterday to her L knee   Repairing ligaments and stuff per pt   Had oxycontin at home "but it isn't touching it" referring to the pain per pt

## 2019-06-28 NOTE — ED Provider Notes (Signed)
[facility] EMERGENCY DEPARTMENT Provider Note   CSN: [id number] Arrival date & time: 06/28/19  1730     History Chief Complaint  Patient presents with  . Knee Pain    [name] is a 58 y.o. female.  Patient with history of chronic back pain on oxycodone 5 mg every 6 hours, presents with complaint of worsening left knee pain after having an arthroscopy yesterday.  Patient had a torn meniscus which was repaired.  Patient states that she did not have much pain after the procedure but upon waking this morning she was having a lot of pain and difficulty with ambulation.  Her home oxycodone is not helping with the pain.  She also takes Voltaren.  No fevers, nausea or vomiting.  She has had swelling around the knee and has been wearing an Ace wrap with a bandage however no significant swelling of other areas of the leg.  No chest pain or shortness of breath.  Onset of symptoms acute.  Course is constant.  Pain is worse with movement.  She states        Past Medical History:  Diagnosis Date  . Anxiety   . Arthritis   . Asthma   . Depression   . Hyperlipemia     Patient Active Problem List   Diagnosis Date Noted  . Derangement of posterior horn of medial meniscus of left knee   . Primary osteoarthritis of left knee   . S/P right knee arthroscopy 05/09/19 05/29/2019  . LOWER LEG, ARTHRITIS, DEGEN./OSTEO 02/18/2009  . DERANGEMENT MENISCUS 02/18/2009  . ANSERINE BURSITIS, RIGHT 02/18/2009    Past Surgical History:  Procedure Laterality Date  . ABDOMINAL HYSTERECTOMY    . BACK SURGERY     pins  . BACK SURGERY  2010  . BLADDER REPAIR    . CESAREAN SECTION  1982, 1984  . COLONOSCOPY N/A 06/12/2013   Procedure: COLONOSCOPY;  Surgeon: Rogene Houston, MD;  Location: [facility] ENDO SUITE;  Service: Endoscopy;  Laterality: N/A;  930-moved to 1020 Ann to notify pt  . KNEE ARTHROSCOPY WITH MEDIAL MENISECTOMY Right 05/09/2019   Procedure: KNEE ARTHROSCOPY WITH MEDIAL MENISECTOMY AND  LATERAL MENISECTOMY;  Surgeon: Carole Civil, MD;  Location: [facility] ORS;  Service: Orthopedics;  Laterality: Right;  . KNEE ARTHROSCOPY WITH MEDIAL MENISECTOMY Left 06/27/2019   Procedure: KNEE ARTHROSCOPY WITH MEDIAL MENISCECTOMY;  Surgeon: Carole Civil, MD;  Location: [facility] ORS;  Service: Orthopedics;  Laterality: Left;     OB History    Gravida  2   Para      Term      Preterm      AB      Living  2     SAB      TAB      Ectopic      Multiple      Live Births              Family History  Problem Relation Age of Onset  . Colon cancer Other     Social History   Tobacco Use  . Smoking status: Former Smoker    Packs/day: 0.50    Years: 20.00    Pack years: 10.00    Types: Cigarettes    Quit date: 04/02/2019    Years since quitting: 0.2  . Smokeless tobacco: Never Used  Substance Use Topics  . Alcohol use: No    Alcohol/week: 0.0 standard drinks  . Drug use: No  Home Medications Prior to Admission medications   Medication Sig Start Date End Date Taking? Authorizing Provider  albuterol (VENTOLIN HFA) 108 (90 Base) MCG/ACT inhaler Inhale 1-2 puffs into the lungs every 6 (six) hours as needed for wheezing or shortness of breath.    [provider]  amphetamine-dextroamphetamine (ADDERALL) 20 MG tablet Take 20 mg by mouth daily.  05/30/13   [provider]  busPIRone (BUSPAR) 5 MG tablet Take 5 mg by mouth 2 (two) times daily.     [provider]  Cholecalciferol (VITAMIN D3) 50 MCG (2000 UT) capsule Take 2,000 Units by mouth daily.     [provider]  diclofenac (VOLTAREN) 75 MG EC tablet TAKE  (1)  TABLET TWICE A DAY WITH MEALS (BREAKFAST AND SUPPER) Patient taking differently: Take 75 mg by mouth 2 (two) times daily.  06/04/19   Sanjuana Kava, MD  dicyclomine (BENTYL) 10 MG capsule TAKE  (1)  CAPSULE THREE TIMES DAILY BEFORE MEALS.  (TAKE AT [place]) Patient taking differently: Take 10 mg  by mouth 3 (three) times daily as needed for spasms.  12/10/18   Setzer, Rona Ravens, NP  DULoxetine (CYMBALTA) 30 MG capsule Take 40 mg by mouth daily. Takes along with 60 mg tab to equal 90 mg. 09/16/11 06/27/19  Lucia Gaskins, MD  estradiol (ESTRACE) 2 MG tablet TAKE ONE TABLET AT BEDTIME Patient taking differently: Take 2 mg by mouth at bedtime.  01/09/19   Florian Buff, MD  FLUoxetine (PROZAC) 40 MG capsule Take 40 mg by mouth daily.    [provider]  ipratropium (ATROVENT) 0.02 % nebulizer solution Take 2.5 mLs (0.5 mg total) by nebulization every 4 (four) hours as needed. Patient not taking: Reported on 05/01/2019 09/16/11 06/13/13  Lucia Gaskins, MD  loratadine (CLARITIN) 10 MG tablet Take 10 mg by mouth daily.    [provider]  naproxen (NAPROSYN) 500 MG tablet Take 500 mg by mouth 2 (two) times daily with a meal.    [provider]  oxyCODONE (OXY IR/ROXICODONE) 5 MG immediate release tablet Take 1 tablet (5 mg total) by mouth every 4 (four) hours as needed for severe pain. 05/09/19   Carole Civil, MD  pravastatin (PRAVACHOL) 40 MG tablet Take 40 mg by mouth at bedtime.     [provider]  traZODone (DESYREL) 50 MG tablet Take 200 mg by mouth at bedtime.  05/01/13   [provider]    Allergies    Patient has no known allergies.  Review of Systems   Review of Systems  Constitutional: Negative for fatigue.  Cardiovascular: Negative for leg swelling.  Musculoskeletal: Positive for arthralgias and joint swelling. Negative for back pain and neck pain.  Skin: Negative for wound.  Neurological: Negative for weakness and numbness.    Physical Exam Updated Vital Signs BP (!) 113/52 (BP Location: Right Arm)   Pulse 70   Temp 98.2 F (36.8 C) (Oral)   Resp 17   Ht 5\' 4"  (1.626 m)   Wt 113.4 kg   SpO2 96%   BMI 42.91 kg/m   Physical Exam Vitals and nursing note reviewed.  Constitutional:      Appearance: She is  well-developed.  HENT:     Head: Normocephalic and atraumatic.  Eyes:     Pupils: Pupils are equal, round, and reactive to light.  Cardiovascular:     Pulses: Normal pulses. No decreased pulses.  Musculoskeletal:  General: Tenderness present.     Cervical back: Normal range of motion and neck supple.     Left knee: Decreased range of motion. Tenderness present over the medial joint line and lateral joint line.       Legs:  Skin:    General: Skin is warm and dry.  Neurological:     Mental Status: She is alert.     Sensory: No sensory deficit.     Comments: Motor, sensation, and vascular distal to the injury is fully intact.      ED Results / Procedures / Treatments   Labs (all labs ordered are listed, but only abnormal results are displayed) Labs Reviewed - No data to display  EKG None  Radiology No results found.  Procedures Procedures (including critical care time)  Medications Ordered in ED Medications  HYDROmorphone (DILAUDID) injection 2 mg (2 mg Intramuscular Given 06/28/19 1946)  ondansetron (ZOFRAN-ODT) disintegrating tablet 4 mg (4 mg Oral Given 06/28/19 1946)    ED Course  I have reviewed the triage vital signs and the nursing notes.  Pertinent labs & imaging results that were available during my care of the patient were reviewed by me and considered in my medical decision making (see chart for details).  Patient seen and examined.  Reviewed surgical records from yesterday.  Patient here with postoperative pain in setting of chronic oxycodone use.  The wounds appear to be well-healing.  I do not see any obvious signs of complication or DVT at this time.  Suspect element of opioid hyperalgesia.  I am willing to give the patient an IM injection of Dilaudid and then discharge her to home on her chronic pain medication.  Vital signs reviewed and are as follows: BP (!) 113/52 (BP Location: Right Arm)   Pulse 70   Temp 98.2 F (36.8 C) (Oral)   Resp 17    Ht 5\' 4"  (1.626 m)   Wt 113.4 kg   SpO2 96%   BMI 42.91 kg/m   8:23 PM patient has received her medications and feels better.  Ready for discharge.  Encouraged continued use of home pain medications and follow-up with her surgeon as planned.  Return with uncontrolled pain, worsening redness, drainage from the wound site, or other concerns.    MDM Rules/Calculators/A&P                      Patient with postoperative pain in the knee.  Wounds appear to be healing well.  No evidence of infection.  Pain is at the surgical site.  No calf or thigh pain suggestive of DVT.  No signs of cellulitis.   Final Clinical Impression(s) / ED Diagnoses Final diagnoses:  Acute post-operative pain  Acute pain of left knee    Rx / DC Orders ED Discharge Orders    None       Carlisle Cater, PA-C 06/28/19 2024    Maudie Flakes, MD 07/02/19 [phone]

## 2019-06-28 NOTE — ED Triage Notes (Signed)
Patient reports she had surgery on her L knee yesterday. States her pain has become unbearable.

## 2019-06-28 NOTE — Discharge Instructions (Signed)
Please read and follow all provided instructions.  Your diagnoses today include:  1. Acute post-operative pain   2. Acute pain of left knee     Tests performed today include:  Vital signs. See below for your results today.   Medications prescribed:   None  Take any prescribed medications only as directed.  Home care instructions:   Follow any educational materials contained in this packet  Follow R.I.C.E. Protocol:  R - rest your injury   I  - use ice on injury without applying directly to skin  C - compress injury with bandage or splint  E - elevate the injury as much as possible  Follow-up instructions: Please follow-up with your orthopedic physician as planned.   Take your usual prescribed pain medication (oxycodone, diclofenac) as directed.   Return instructions:   Please return if your toes or feet are numb or tingling, appear gray or blue, or you have severe pain (also elevate the leg and loosen splint or wrap if you were given one)  Please return to the Emergency Department if you experience worsening symptoms.   Please return if you have any other emergent concerns.  Additional Information:  Your vital signs today were: BP (!) 113/52 (BP Location: Right Arm)   Pulse 70   Temp 98.2 F (36.8 C) (Oral)   Resp 17   Ht 5\' 4"  (1.626 m)   Wt 113.4 kg   SpO2 96%   BMI 42.91 kg/m  If your blood pressure (BP) was elevated above 135/85 this visit, please have this repeated by your doctor within one month. --------------

## 2019-07-01 ENCOUNTER — Telehealth: Payer: Self-pay

## 2019-07-01 DIAGNOSIS — Z9889 Other specified postprocedural states: Secondary | ICD-10-CM | POA: Insufficient documentation

## 2019-07-01 NOTE — Telephone Encounter (Signed)
Patient left message on voicemail (12/18 @ 12:42 pm) stating that her leg is hurting her, she can hardly walk on it and it seems like the pain medicine isn't helping.  Please call and advise

## 2019-07-01 NOTE — Telephone Encounter (Signed)
She is post op, can we work her in?

## 2019-07-03 ENCOUNTER — Encounter: Payer: Self-pay | Admitting: Orthopedic Surgery

## 2019-07-03 ENCOUNTER — Other Ambulatory Visit: Payer: Self-pay

## 2019-07-03 ENCOUNTER — Ambulatory Visit (INDEPENDENT_AMBULATORY_CARE_PROVIDER_SITE_OTHER): Payer: Medicare Other | Admitting: Orthopedic Surgery

## 2019-07-03 DIAGNOSIS — Z9889 Other specified postprocedural states: Secondary | ICD-10-CM

## 2019-07-03 NOTE — Progress Notes (Signed)
Postop visit  Chief Complaint  Patient presents with  . Routine Post Op    06/27/2019 leftt knee scope    N[place]ah had arthroscopy of the left knee she had medial compartment grade 2 and 3 chondral changes posterior horn root tear lateral compartment was normal she had some grade 3 and grade 2 changes in the trochlea and patella she was doing well but on December 18 went to the ER had to be given some Dilaudid for extensive knee pain checked out okay presents today with decreased pain ambulating with a cane saying that the medication that she got helped  She does have chronic pain and is on oxycodone  Her knee looks good her sutures were removed, I did not see any warmth drainage or swelling of the knee joint  Recommend home exercise program ice 4-5 times a day come back in 3 weeks  Encounter Diagnosis  Name Primary?  . S/P left knee arthroscopy 06/27/2019 Yes

## 2019-07-03 NOTE — Patient Instructions (Signed)
Ice 4-5 times a day  Exercises at home  Follow-up 3 weeks

## 2019-07-10 ENCOUNTER — Ambulatory Visit: Payer: Medicare Other | Admitting: Orthopedic Surgery

## 2019-07-18 ENCOUNTER — Encounter: Payer: Self-pay | Admitting: Orthopedic Surgery

## 2019-07-26 ENCOUNTER — Ambulatory Visit (INDEPENDENT_AMBULATORY_CARE_PROVIDER_SITE_OTHER): Payer: Medicare Other | Admitting: Orthopedic Surgery

## 2019-07-26 ENCOUNTER — Other Ambulatory Visit: Payer: Self-pay

## 2019-07-26 VITALS — BP 133/72 | HR 80 | Temp 97.3°F | Ht 64.0 in | Wt 232.0 lb

## 2019-07-26 DIAGNOSIS — M5442 Lumbago with sciatica, left side: Secondary | ICD-10-CM

## 2019-07-26 DIAGNOSIS — Z9889 Other specified postprocedural states: Secondary | ICD-10-CM

## 2019-07-26 MED ORDER — TIZANIDINE HCL 4 MG PO TABS: 4.0000 mg | ORAL_TABLET | Freq: Every day | ORAL | 1 refills | Status: DC

## 2019-07-26 MED ORDER — PREDNISONE 10 MG (48) PO TBPK: ORAL_TABLET | Freq: Every day | ORAL | 0 refills | Status: DC

## 2019-07-26 MED ORDER — GABAPENTIN 100 MG PO CAPS: 100.0000 mg | ORAL_CAPSULE | Freq: Three times a day (TID) | ORAL | 2 refills | Status: DC

## 2019-07-26 NOTE — Patient Instructions (Signed)

## 2019-07-26 NOTE — Progress Notes (Signed)
Postop appointment  The patient is 59 years old she had arthroscopy of the left knee on 1217 for torn meniscus and arthritis.  We cleaned out her knee very nicely however she presents today with extensive and severe left leg and knee pain which actually starts up in her left hip lower back which is been worse over the last 2 to 3 weeks and then radiates down to the knee and even into the ankle  She has good bowel bladder function  She is already on Voltaren 75 mg twice a day and oxycodone IR chronically she also has a history of back pain and chronic back pain  Her knee actually looks good there is no swelling in the joint she can bend it up to 110 degrees full extension is noted.  However she is tender on the lateral side of the leg including the lower leg tender in the lower back positive left straight leg raise with some discomfort but without tingling or numbness  Past Medical History:  Diagnosis Date  . Anxiety   . Arthritis   . Asthma   . Depression   . Hyperlipemia     Current Outpatient Medications:  .  albuterol (VENTOLIN HFA) 108 (90 Base) MCG/ACT inhaler, Inhale 1-2 puffs into the lungs every 6 (six) hours as needed for wheezing or shortness of breath., Disp: , Rfl:  .  amphetamine-dextroamphetamine (ADDERALL) 20 MG tablet, Take 20 mg by mouth daily. , Disp: , Rfl:  .  busPIRone (BUSPAR) 5 MG tablet, Take 5 mg by mouth 2 (two) times daily. , Disp: , Rfl:  .  Cholecalciferol (VITAMIN D3) 50 MCG (2000 UT) capsule, Take 2,000 Units by mouth daily. , Disp: , Rfl:  .  diclofenac (VOLTAREN) 75 MG EC tablet, TAKE  (1)  TABLET TWICE A DAY WITH MEALS (BREAKFAST AND SUPPER) (Patient taking differently: Take 75 mg by mouth 2 (two) times daily. ), Disp: 60 tablet, Rfl: 5 .  dicyclomine (BENTYL) 10 MG capsule, TAKE  (1)  CAPSULE THREE TIMES DAILY BEFORE MEALS.  (TAKE AT [place]) (Patient taking differently: Take 10 mg by mouth 3 (three) times daily as needed for spasms. ),  Disp: 90 capsule, Rfl: 2 .  estradiol (ESTRACE) 2 MG tablet, TAKE ONE TABLET AT BEDTIME (Patient taking differently: Take 2 mg by mouth at bedtime. ), Disp: 30 tablet, Rfl: 11 .  FLUoxetine (PROZAC) 40 MG capsule, Take 40 mg by mouth daily., Disp: , Rfl:  .  loratadine (CLARITIN) 10 MG tablet, Take 10 mg by mouth daily., Disp: , Rfl:  .  naproxen (NAPROSYN) 500 MG tablet, Take 500 mg by mouth 2 (two) times daily with a meal., Disp: , Rfl:  .  oxyCODONE (OXY IR/ROXICODONE) 5 MG immediate release tablet, Take 1 tablet (5 mg total) by mouth every 4 (four) hours as needed for severe pain., Disp: 30 tablet, Rfl: 0 .  pravastatin (PRAVACHOL) 40 MG tablet, Take 40 mg by mouth at bedtime. , Disp: , Rfl:  .  traZODone (DESYREL) 50 MG tablet, Take 200 mg by mouth at bedtime. , Disp: , Rfl:  .  DULoxetine (CYMBALTA) 30 MG capsule, Take 40 mg by mouth daily. Takes along with 60 mg tab to equal 90 mg., Disp: , Rfl:  .  gabapentin (NEURONTIN) 100 MG capsule, Take 1 capsule (100 mg total) by mouth 3 (three) times daily., Disp: 90 capsule, Rfl: 2 .  ipratropium (ATROVENT) 0.02 % nebulizer solution, Take 2.5  mLs (0.5 mg total) by nebulization every 4 (four) hours as needed. (Patient not taking: Reported on 05/01/2019), Disp: 75 mL, Rfl: 2 .  predniSONE (STERAPRED UNI-PAK 48 TAB) 10 MG (48) TBPK tablet, Take by mouth daily., Disp: 48 tablet, Rfl: 0 .  tiZANidine (ZANAFLEX) 4 MG tablet, Take 1 tablet (4 mg total) by mouth daily., Disp: 30 tablet, Rfl: 1  Medical decision making  Status post left knee arthroscopy stable  Chronic pain with exacerbation lower back with sciatica  Recommend 3 new prescriptions  Follow-up 3 weeks  Use a cane  Summation unrelated problem in the postop period acute uncomplicated illness prescription management  Encounter Diagnoses  Name Primary?  . Left-sided low back pain with left-sided sciatica, unspecified chronicity Yes  . S/P left knee arthroscopy 06/27/2019

## 2019-08-16 ENCOUNTER — Ambulatory Visit (INDEPENDENT_AMBULATORY_CARE_PROVIDER_SITE_OTHER): Payer: Medicare Other | Admitting: Orthopedic Surgery

## 2019-08-16 ENCOUNTER — Ambulatory Visit: Payer: Medicare Other

## 2019-08-16 ENCOUNTER — Other Ambulatory Visit: Payer: Self-pay

## 2019-08-16 VITALS — BP 130/76 | HR 79 | Temp 97.6°F | Ht 64.0 in | Wt 232.0 lb

## 2019-08-16 DIAGNOSIS — M5410 Radiculopathy, site unspecified: Secondary | ICD-10-CM

## 2019-08-16 DIAGNOSIS — M5442 Lumbago with sciatica, left side: Secondary | ICD-10-CM | POA: Diagnosis not present

## 2019-08-16 DIAGNOSIS — Z9889 Other specified postprocedural states: Secondary | ICD-10-CM

## 2019-08-16 MED ORDER — TIZANIDINE HCL 4 MG PO TABS: 4.0000 mg | ORAL_TABLET | Freq: Three times a day (TID) | ORAL | 3 refills | Status: DC

## 2019-08-16 MED ORDER — GABAPENTIN 300 MG PO CAPS: 300.0000 mg | ORAL_CAPSULE | Freq: Three times a day (TID) | ORAL | 3 refills | Status: AC

## 2019-08-16 MED ORDER — METHYLPREDNISOLONE ACETATE 40 MG/ML IJ SUSP
40.0000 mg | Freq: Once | INTRAMUSCULAR | Status: AC
  Administered 2019-08-16: 40 mg via INTRAMUSCULAR

## 2019-08-16 NOTE — Patient Instructions (Signed)
Increase the gabapentin to 300 mg 3 x a day   And the tizanidine to 4 mg 3 x a day   CT scan will be ordered   Make an appt for 1 week after CT scan

## 2019-08-16 NOTE — Progress Notes (Signed)
Chief Complaint  Patient presents with  . Leg Pain    3 week recheck on back pain/ left leg     [name] is status post right knee arthroscopy in October 2020 and then she had a left knee arthroscopy on June 27, 2019 but is having a complication secondary to left leg pain  She had a lumbar fusion several years ago by Dr. Ellene Route.   On her last visit we asked her to take a Dosepak start some tizanidine and gabapentin she is already on long-term chronic oxycodone immediate release 5 mg every 4 hours as needed  She did not get any relief  X-rays today show what appears to be a stable fusion construct in the lumbar region  Her examination reveals a quiet knee portal sites are clean no joint effusion her range of motion is 5degrees to 125 degrees knee feels stable she does have some tenderness around the knee joint but nothing out of the ordinary  She does have tenderness in the L5 dermatome and then in the posterior lateral aspect of the left thigh as well as the lumbar spine and left buttock  Recommend increasing her Neurontin, her Zanaflex.  We gave her an IM shot of 40 mg Depo-Medrol  We recommended an MRI scan but she says she cannot sit still for those so we recommended a CT scan  Meds ordered this encounter  Medications  . tiZANidine (ZANAFLEX) 4 MG tablet    Sig: Take 1 tablet (4 mg total) by mouth 3 (three) times daily.    Dispense:  21 tablet    Refill:  3  . gabapentin (NEURONTIN) 300 MG capsule    Sig: Take 1 capsule (300 mg total) by mouth 3 (three) times daily.    Dispense:  21 capsule    Refill:  3  . methylPREDNISolone acetate (DEPO-MEDROL) injection 40 mg   IM shot left hip  Nurse has given the patient intramuscular injection left hip with 40 mg Depo-Medrol 2 cc 1% lidocaine tolerated well no complications

## 2019-08-22 ENCOUNTER — Other Ambulatory Visit: Payer: Self-pay

## 2019-08-22 ENCOUNTER — Ambulatory Visit (HOSPITAL_COMMUNITY)
Admission: RE | Admit: 2019-08-22 | Discharge: 2019-08-22 | Disposition: A | Discharge status: Home / Self Care | Payer: Medicare Other | Source: Ambulatory Visit | Attending: Orthopedic Surgery | Admitting: Orthopedic Surgery

## 2019-08-22 DIAGNOSIS — M5442 Lumbago with sciatica, left side: Secondary | ICD-10-CM

## 2019-08-22 DIAGNOSIS — M5410 Radiculopathy, site unspecified: Secondary | ICD-10-CM | POA: Insufficient documentation

## 2019-08-26 ENCOUNTER — Encounter: Payer: Self-pay | Admitting: Orthopedic Surgery

## 2019-08-26 ENCOUNTER — Ambulatory Visit (INDEPENDENT_AMBULATORY_CARE_PROVIDER_SITE_OTHER): Payer: Medicare Other | Admitting: Orthopedic Surgery

## 2019-08-26 ENCOUNTER — Other Ambulatory Visit: Payer: Self-pay

## 2019-08-26 VITALS — BP 124/71 | HR 83 | Temp 97.2°F | Ht 64.0 in | Wt 232.0 lb

## 2019-08-26 DIAGNOSIS — M5410 Radiculopathy, site unspecified: Secondary | ICD-10-CM | POA: Diagnosis not present

## 2019-08-26 NOTE — Progress Notes (Signed)
Chief Complaint  Patient presents with  . Follow-up    Recheck on back pain    59 year old female with history of back fusion and meniscectomies in both knees did well on the right started having left leg pain after her left knee surgery and after a course of steroids rest she did not improve and was sent for MRI  Past Medical History:  Diagnosis Date  . Anxiety   . Arthritis   . Asthma   . Depression   . Hyperlipemia      MRI.  Was able to view the imaging studies and after looking at the images, my reading is that she has multilevel disc disease and a new area of spinal stenosis at L4 and 5 with compression of L4 nerve root   CLINICAL DATA:  Lumbar radiculopathy, prior surgery lower left-sided back pain radiating to left leg   EXAM: CT LUMBAR SPINE WITHOUT CONTRAST   TECHNIQUE: Multidetector CT imaging of the lumbar spine was performed without intravenous contrast administration. Multiplanar CT image reconstructions were also generated.   COMPARISON:  CT 04/17/2007, lumbar radiographs 08/16/2019, lumbar MRI 12/14/2005   FINDINGS: Segmentation: Transitional lumbosacral anatomy. In keeping with prior numbering system, the fused levels will be referred to as L5-S1 with partial lumbarization of S1. Per this numbering system, superior aspect of L1 is partially excluded from the field of view. Rudimentary ribs versus unfused transverse processes at L1 are noted.   Alignment: Minimal 2 mm of grade 1 anterolisthesis L4 on L5 on a likely facet degenerative basis with vacuum phenomenon. No spondylolysis is identified.   Vertebrae: Prior L5-S1 posterior spinal fusion with L5 laminotomies. Secured by intact fusion rods and bilateral transpedicular screws at the L5 and S1 levels. Interbody spacer is noted at the L5-S1 disc space with progressive bony incorporation and without evidence subsidence or other acute complication.   Paraspinal and other soft tissues: Chronic  postsurgical changes noted in the posterior soft tissues. Some mild fatty atrophy of the lower paraspinal musculature may be on a postsurgical or degenerative basis. Included portions of the posterior abdomen and pelvis are unremarkable aside from a atherosclerotic plaque within the included abdominal aorta and iliac arteries.   Disc levels:   Level by level evaluation of the lumbar spine below:   L1-L2: Exclusion of the superior L1 endplate. No significant posterior disc abnormality. No significant spinal canal stenosis or foraminal narrowing.   L2-L3: No significant posterior disc abnormality. No significant spinal canal stenosis or foraminal narrowing.   L3-L4: Mild disc height loss and small global disc bulge. No significant spinal canal stenosis or foraminal narrowing.   L4-L5: Mild disc height loss and small global disc bulge with superimposed left subarticular disc protrusion. Mild grade 1 anterolisthesis and progressive facet degenerative change with back game phenomenon as above as above. Moderate left and mild right neural foraminal narrowing with disc protrusion on the left contacting the exiting nerve root.   L5-S1: Minimal hypertrophic spurring. Effacement of the ventral thecal sac without significant canal stenosis. No significant neural foraminal narrowing.   S1-S2: Minimal narrowing of the rudimentary disc space with small global disc bulge and moderate bilateral facet hypertrophic changes. No significant spinal canal or foraminal stenosis.   IMPRESSION: 1. Transitional lumbosacral anatomy. Numbering system utilized above is in keeping with prior comparison studies which denote the posterior fusion at L5-S1 suggesting a lumbarized S1 level with rudimentary disc space at S1-S2. 2. Prior L5-S1 posterior spinal fusion posterior decompression of acute complication.  3. New grade 1 anterolisthesis L4 on L5 with progressive mild global disc bulge and left  subarticular disc protrusion contacting the exiting L4 nerve root. May correlate with distribution of patient's symptoms. 4. Aortic Atherosclerosis (ICD10-I70.0).     Electronically Signed   By: Lovena Le M.D.   On: 08/22/2019 22:44   Encounter Diagnosis  Name Primary?  . Radicular pain of left lower extremity Yes    This requires referral to neurosurgery

## 2019-08-26 NOTE — Patient Instructions (Addendum)
You have a new disc issue with pressure on the 4th nerve root and this will require referral to neurosurgery for evaluation and management   Radicular Pain Radicular pain is a type of pain that spreads from your back or neck along a spinal nerve. Spinal nerves are nerves that leave the spinal cord and go to the muscles. Radicular pain is sometimes called radiculopathy, radiculitis, or a pinched nerve. When you have this type of pain, you may also have weakness, numbness, or tingling in the area of your body that is supplied by the nerve. The pain may feel sharp and burning. Depending on which spinal nerve is affected, the pain may occur in the:  Neck area (cervical radicular pain). You may also feel pain, numbness, weakness, or tingling in the arms.  Mid-spine area (thoracic radicular pain). You would feel this pain in the back and chest. This type is rare.  Lower back area (lumbar radicular pain). You would feel this pain as low back pain. You may feel pain, numbness, weakness, or tingling in the buttocks or legs. Sciatica is a type of lumbar radicular pain that shoots down the back of the leg. Radicular pain occurs when one of the spinal nerves becomes irritated or squeezed (compressed). It is often caused by something pushing on a spinal nerve, such as one of the bones of the spine (vertebrae) or one of the round cushions between vertebrae (intervertebral disks). This can result from:  An injury.  Wear and tear or aging of a disk.  The growth of a bone spur that pushes on the nerve. Radicular pain often goes away when you follow instructions from your health care provider for relieving pain at home. Follow these instructions at home: Managing pain      If directed, put ice on the affected area: ? Put ice in a plastic bag. ? Place a towel between your skin and the bag. ? Leave the ice on for 20 minutes, 2-3 times a day.  If directed, apply heat to the affected area as often as told by  your health care provider. Use the heat source that your health care provider recommends, such as a moist heat pack or a heating pad. ? Place a towel between your skin and the heat source. ? Leave the heat on for 20-30 minutes. ? Remove the heat if your skin turns bright red. This is especially important if you are unable to feel pain, heat, or cold. You may have a greater risk of getting burned. Activity   Do not sit or rest in bed for long periods of time.  Try to stay as active as possible. Ask your health care provider what type of exercise or activity is best for you.  Avoid activities that make your pain worse, such as bending and lifting.  Do not lift anything that is heavier than 10 lb (4.5 kg), or the limit that you are told, until your health care provider says that it is safe.  Practice using proper technique when lifting items. Proper lifting technique involves bending your knees and rising up.  Do strength and range-of-motion exercises only as told by your health care provider or physical therapist. General instructions  Take over-the-counter and prescription medicines only as told by your health care provider.  Pay attention to any changes in your symptoms.  Keep all follow-up visits as told by your health care provider. This is important. ? Your health care provider may send you to a physical  therapist to help with this pain. Contact a health care provider if:  Your pain and other symptoms get worse.  Your pain medicine is not helping.  Your pain has not improved after a few weeks of home care.  You have a fever. Get help right away if:  You have severe pain, weakness, or numbness.  You have difficulty with bladder or bowel control. Summary  Radicular pain is a type of pain that spreads from your back or neck along a spinal nerve.  When you have radicular pain, you may also have weakness, numbness, or tingling in the area of your body that is supplied by the  nerve.  The pain may feel sharp or burning.  Radicular pain may be treated with ice, heat, medicines, or physical therapy. This information is not intended to replace advice given to you by your health care provider. Make sure you discuss any questions you have with your health care provider. Document Revised: 01/09/2018 Document Reviewed: 01/09/2018 Elsevier Patient Education  [place].  Spinal Stenosis  Spinal stenosis occurs when the open space (spinal canal) between the bones of your spine (vertebrae) narrows, putting pressure on the spinal cord or nerves. What are the causes? This condition is caused by areas of bone pushing into the central canals of your vertebrae. This condition may be present at birth (congenital), or it may be caused by:  Arthritic deterioration of your vertebrae (spinal degeneration). This usually starts around age 72.  Injury or trauma to the spine.  Tumors in the spine.  Calcium deposits in the spine. What are the signs or symptoms? Symptoms of this condition include:  Pain in the neck or back that is generally worse with activities, particularly when standing and walking.  Numbness, tingling, hot or cold sensations, weakness, or weariness in your legs.  Pain going up and down the leg (sciatica).  Frequent episodes of falling.  A foot-slapping gait that leads to muscle weakness. In more serious cases, you may develop:  Problemspassing stool or passing urine.  Difficulty having sex.  Loss of feeling in part or all of your leg. Symptoms may come on slowly and get worse over time. How is this diagnosed? This condition is diagnosed based on your medical history and a physical exam. Tests will also be done, such as:  MRI.  CT scan.  X-ray. How is this treated? Treatment for this condition often focuses on managing your pain and any other symptoms. Treatment may include:  Practicing good posture to lessen pressure on your  nerves.  Exercising to strengthen muscles, build endurance, improve balance, and maintain good joint movement (range of motion).  Losing weight, if needed.  Taking medicines to reduce swelling, inflammation, or pain.  Assistive devices, such as a corset or brace. In some cases, surgery may be needed. The most common procedure is decompression laminectomy. This is done to remove excess bone that puts pressure on your nerve roots. Follow these instructions at home: Managing pain, stiffness, and swelling  Do all exercises and stretches as told by your health care provider.  Practice good posture. If you were given a brace or a corset, wear it as told by your health care provider.  Do not do any activities that cause pain. Ask your health care provider what activities are safe for you.  Do not lift anything that is heavier than 10 lb (4.5 kg) or the limit that your health care provider tells you.  Maintain a healthy weight. Talk  with your health care provider if you need help losing weight.  If directed, apply heat to the affected area as often as told by your health care provider. Use the heat source that your health care provider recommends, such as a moist heat pack or a heating pad. ? Place a towel between your skin and the heat source. ? Leave the heat on for 20-30 minutes. ? Remove the heat if your skin turns bright red. This is especially important if you are not able to feel pain, heat, or cold. You may have a greater risk of getting burned. General instructions  Take over-the-counter and prescription medicines only as told by your health care provider.  Do not use any products that contain nicotine or tobacco, such as cigarettes and e-cigarettes. If you need help quitting, ask your health care provider.  Eat a healthy diet. This includes plenty of fruits and vegetables, whole grains, and low-fat (lean) protein.  Keep all follow-up visits as told by your health care provider.  This is important. Contact a health care provider if:  Your symptoms do not get better or they get worse.  You have a fever. Get help right away if:  You have new or worse pain in your neck or upper back.  You have severe pain that cannot be controlled with medicines.  You are dizzy.  You have vision problems, blurred vision, or double vision.  You have a severe headache that is worse when you stand.  You have nausea or you vomit.  You develop new or worse numbness or tingling in your back or legs.  You have pain, redness, swelling, or warmth in your arm or leg. Summary  Spinal stenosis occurs when the open space (spinal canal) between the bones of your spine (vertebrae) narrows. This narrowing puts pressure on the spinal cord or nerves.  Spinal stenosis can cause numbness, weakness, or pain in the neck, back, and legs.  This condition may be caused by a birth defect, arthritic deterioration of your vertebrae, injury, tumors, or calcium deposits.  This condition is usually diagnosed with MRIs, CT scans, and X-rays. This information is not intended to replace advice given to you by your health care provider. Make sure you discuss any questions you have with your health care provider. Document Revised: 06/09/2017 Document Reviewed: 06/01/2016 Elsevier Patient Education  2020 [employer].

## 2019-08-30 ENCOUNTER — Ambulatory Visit: Payer: Medicare Other | Admitting: Orthopedic Surgery

## 2019-09-10 ENCOUNTER — Telehealth: Payer: Self-pay | Admitting: Radiology

## 2019-09-10 NOTE — Telephone Encounter (Signed)
Notice received from [employer] today patient is getting Naproxen and Diclofenac from pharmacy and may be using both. She should use one or the other but not both  I called her, left message for her to call back.

## 2019-09-10 NOTE — Telephone Encounter (Signed)
I spoke to patient, she is using Naprosyn not the Diclofenac  She is aware not to use them together.

## 2019-10-03 ENCOUNTER — Other Ambulatory Visit (HOSPITAL_COMMUNITY): Payer: Self-pay | Admitting: Neurological Surgery

## 2019-10-03 ENCOUNTER — Ambulatory Visit: Payer: Medicare Other | Attending: Internal Medicine

## 2019-10-03 ENCOUNTER — Other Ambulatory Visit: Payer: Self-pay | Admitting: Neurological Surgery

## 2019-10-03 DIAGNOSIS — M4316 Spondylolisthesis, lumbar region: Secondary | ICD-10-CM

## 2019-10-03 DIAGNOSIS — Z23 Encounter for immunization: Secondary | ICD-10-CM

## 2019-10-03 NOTE — Progress Notes (Signed)
   Covid-19 Vaccination Clinic  Name:  [name]    MRN: [id number] DOB: March 15, 1961  10/03/2019  [name] was observed post Covid-19 immunization for 15 minutes without incident. She was provided with Vaccine Information Sheet and instruction to access the V-Safe system.   [name] was instructed to call 911 with any severe reactions post vaccine: Marland Kitchen Difficulty breathing  . Swelling of face and throat  . A fast heartbeat  . A bad rash all over body  . Dizziness and weakness   Immunizations Administered    Name Date Dose VIS Date Route   Moderna COVID-19 Vaccine 10/03/2019  8:40 AM 0.5 mL 06/11/2019 Intramuscular   Manufacturer: Moderna   Lot: [id number]   [id number]

## 2019-10-10 ENCOUNTER — Ambulatory Visit (HOSPITAL_COMMUNITY)
Admission: RE | Admit: 2019-10-10 | Discharge: 2019-10-10 | Disposition: A | Discharge status: Home / Self Care | Payer: Medicare Other | Source: Ambulatory Visit | Attending: Neurological Surgery | Admitting: Neurological Surgery

## 2019-10-10 ENCOUNTER — Other Ambulatory Visit: Payer: Self-pay

## 2019-10-10 DIAGNOSIS — M48062 Spinal stenosis, lumbar region with neurogenic claudication: Secondary | ICD-10-CM | POA: Diagnosis present

## 2019-10-10 DIAGNOSIS — M4316 Spondylolisthesis, lumbar region: Secondary | ICD-10-CM | POA: Insufficient documentation

## 2019-10-10 DIAGNOSIS — Z981 Arthrodesis status: Secondary | ICD-10-CM | POA: Insufficient documentation

## 2019-10-10 MED ORDER — DIAZEPAM 5 MG PO TABS
10.0000 mg | ORAL_TABLET | Freq: Once | ORAL | Status: AC
  Administered 2019-10-10: 10 mg via ORAL
  Filled 2019-10-10: qty 2

## 2019-10-10 MED ORDER — HYDROCODONE-ACETAMINOPHEN 5-325 MG PO TABS: 1.0000 | ORAL_TABLET | Freq: Four times a day (QID) | ORAL | Status: DC | PRN

## 2019-10-10 MED ORDER — DIAZEPAM 5 MG PO TABS
ORAL_TABLET | ORAL | Status: AC
  Filled 2019-10-10: qty 2

## 2019-10-10 MED ORDER — ONDANSETRON HCL 4 MG/2ML IJ SOLN: 4.0000 mg | Freq: Four times a day (QID) | INTRAMUSCULAR | Status: DC | PRN

## 2019-10-10 MED ORDER — IOHEXOL 180 MG/ML  SOLN
20.0000 mL | Freq: Once | INTRAMUSCULAR | Status: AC | PRN
  Administered 2019-10-10: 12 mL via INTRATHECAL

## 2019-10-10 MED ORDER — LIDOCAINE HCL (PF) 1 % IJ SOLN
5.0000 mL | Freq: Once | INTRAMUSCULAR | Status: AC
  Administered 2019-10-10: 5 mL via INTRADERMAL

## 2019-10-10 NOTE — Procedures (Signed)
[name] is a 59 year old individual who underwent surgical decompression arthrodesis at L4-L5 a number of years ago she recently has developed increasing weakness in her legs with loss of ability to walk greater distances than 30 or 40 feet is concerned that she may be developing degenerative spondylolisthesis at an adjacent level and myelogram postmyelogram CAT scan is now being performed to evaluate this process further.  The patient notes she has severe claustrophobia and cannot lie still for an MRI therefore myelogram is been offered.  Pre op Dx: Lumbar stenosis with neurogenic claudication, history of fusion L4-L5 Post op Dx: Same Procedure: Lumbar myelogram Surgeon: Delanna Blacketer Puncture level: L3-4 Fluid color: Clear colorless Injection: Iohexol 200, 12 mL Findings: Mild spondylolisthesis L3-L4 no severe stenosis centrally.  Further evaluation with CT scanning

## 2019-10-10 NOTE — Discharge Instructions (Signed)
Myelogram and Lumbar Puncture Discharge Instructions ° °1. Go home and rest quietly for the next 24 hours.  It is important to lie flat for the next 24 hours.  Get up only to go to the restroom.  You may lie in the bed or on a couch on your back, your stomach, your left side or your right side.  You may have one pillow under your head.  You may have pillows between your knees while you are on your side or under your knees while you are on your back. ° °2. DO NOT drive today.  Recline the seat as far back as it will go, while still wearing your seat belt, on the way home. ° °3. You may get up to go to the bathroom as needed.  You may sit up for 10 minutes to eat.  You may resume your normal diet and medications unless otherwise indicated. ° °4. The incidence of headache, nausea, or vomiting is about 5% (one in 20 patients).  If you develop a headache, lie flat and drink plenty of fluids until the headache goes away.  Caffeinated beverages may be helpful.  If you develop severe nausea and vomiting or a headache that does not go away with flat bed rest, call Dr Elsner ° °5. You may resume normal activities after your 24 hours of bed rest is over; however, do not exert yourself strongly or do any heavy lifting tomorrow. ° °6. Call your physician for a follow-up appointment.  The results of your myelogram will be sent directly to your physician by the following day. ° °7. If you have any questions or if complications develop after you arrive home, please call Dr Elsner ° °Discharge instructions have been explained to the patient.  The patient, or the person responsible for the patient, fully understands these instructions. ° ° °

## 2019-10-16 ENCOUNTER — Other Ambulatory Visit: Payer: Self-pay | Admitting: Neurological Surgery

## 2019-10-30 ENCOUNTER — Ambulatory Visit: Payer: Medicare Other | Attending: Internal Medicine

## 2019-10-30 DIAGNOSIS — Z23 Encounter for immunization: Secondary | ICD-10-CM

## 2019-10-30 NOTE — Progress Notes (Signed)
   Covid-19 Vaccination Clinic  Name:  [name]    MRN: [id number] DOB: May 28, 1961  10/30/2019  [name] was observed post Covid-19 immunization for 15 minutes without incident. She was provided with Vaccine Information Sheet and instruction to access the V-Safe system.   [name] was instructed to call 911 with any severe reactions post vaccine: Marland Kitchen Difficulty breathing  . Swelling of face and throat  . A fast heartbeat  . A bad rash all over body  . Dizziness and weakness   Immunizations Administered    Name Date Dose VIS Date Route   Moderna COVID-19 Vaccine 10/30/2019  8:11 AM 0.5 mL 06/2019 Intramuscular   Manufacturer: Levan Hurst   Lot: [id number]   [id number]      Covid-19 Vaccination Clinic  Name:  [name]    MRN: [id number] DOB: Jan 29, 1961  10/30/2019  [name] was observed post Covid-19 immunization for 15 minutes without incident. She was provided with Vaccine Information Sheet and instruction to access the V-Safe system.   [name] was instructed to call 911 with any severe reactions post vaccine: Marland Kitchen Difficulty breathing  . Swelling of face and throat  . A fast heartbeat  . A bad rash all over body  . Dizziness and weakness   Immunizations Administered    Name Date Dose VIS Date Route   Moderna COVID-19 Vaccine 10/30/2019  8:11 AM 0.5 mL 06/2019 Intramuscular   Manufacturer: Moderna   Lot: [id number]   [id number]

## 2019-10-31 NOTE — Progress Notes (Signed)
[place], [place] [address] Phone: [phone] Fax: [phone]      Your procedure is scheduled on November 05, 2019.  Report to [facility] Main Entrance "A" at 05:30 A.M., and check in at the Admitting office.  Call this number if you have problems the morning of surgery:  [phone]  Call [phone] if you have any questions prior to your surgery date Monday-Friday 8am-4pm   Remember:  Do not eat or drink after midnight the night before your surgery    Take these medicines the morning of surgery with A SIP OF WATER : Buspirone (Buspar) Fluoxetine (Prozac) Gabapentin (Neurontin) if needed Loratadine (Claritin) Oxycodone (Oxy IR/Roxicodone) if needed Tizanidine (Zanaflex) if needed  As of today, STOP taking any Aspirin (unless otherwise instructed by your surgeon) and Aspirin containing products, Aleve, Naproxen, Ibuprofen, Motrin, Advil, Goody's, BC's, all herbal medications, fish oil, and all vitamins.                     Do not wear jewelry, make up, or nail polish            Do not wear lotions, powders, perfumes/colognes, or deodorant.            Do not shave 48 hours prior to surgery.              Do not bring valuables to the hospital.            [facility] is not responsible for any belongings or valuables.  Do NOT Smoke (Tobacco/Vapping) or drink Alcohol 24 hours prior to your procedure If you use a CPAP at night, you may bring all equipment for your overnight stay.   Contacts, glasses, dentures or bridgework may not be worn into surgery.      For patients admitted to the hospital, discharge time will be determined by your treatment team.   Patients discharged the day of surgery will not be allowed to drive home, and someone needs to stay with them for 24 hours.    Special instructions:   [place]- Preparing For Surgery  Before surgery, you can play an important role. Because skin is  not sterile, your skin needs to be as free of germs as possible. You can reduce the number of germs on your skin by washing with CHG (chlorahexidine gluconate) Soap before surgery.  CHG is an antiseptic cleaner which kills germs and bonds with the skin to continue killing germs even after washing.    Oral Hygiene is also important to reduce your risk of infection.  Remember - BRUSH YOUR TEETH THE MORNING OF SURGERY WITH YOUR REGULAR TOOTHPASTE  Please do not use if you have an allergy to CHG or antibacterial soaps. If your skin becomes reddened/irritated stop using the CHG.  Do not shave (including legs and underarms) for at least 48 hours prior to first CHG shower. It is OK to shave your face.  Please follow these instructions carefully.   1. Shower the NIGHT BEFORE SURGERY and the MORNING OF SURGERY with CHG Soap.   2. If you chose to wash your hair, wash your hair first as usual with your normal shampoo.  3. After you shampoo, rinse your hair and body thoroughly to remove the shampoo.  4. Use CHG as you would any other liquid soap. You can apply CHG directly to the skin and wash gently with a scrungie or a  clean washcloth.   5. Apply the CHG Soap to your body ONLY FROM THE NECK DOWN.  Do not use on open wounds or open sores. Avoid contact with your eyes, ears, mouth and genitals (private parts). Wash Face and genitals (private parts)  with your normal soap.   6. Wash thoroughly, paying special attention to the area where your surgery will be performed.  7. Thoroughly rinse your body with warm water from the neck down.  8. DO NOT shower/wash with your normal soap after using and rinsing off the CHG Soap.  9. Pat yourself dry with a CLEAN TOWEL.  10. Wear CLEAN PAJAMAS to bed the night before surgery, wear comfortable clothes the morning of surgery  11. Place CLEAN SHEETS on your bed the night of your first shower and DO NOT SLEEP WITH PETS.   Day of Surgery:   Do not apply any  deodorants/lotions.  Please wear clean clothes to the hospital/surgery center.   Remember to brush your teeth WITH YOUR REGULAR TOOTHPASTE.   Please read over the following fact sheets that you were given.

## 2019-11-01 ENCOUNTER — Encounter (HOSPITAL_COMMUNITY)
Admission: RE | Admit: 2019-11-01 | Discharge: 2019-11-01 | Disposition: A | Discharge status: Home / Self Care | Payer: Medicare Other | Source: Ambulatory Visit | Attending: Neurological Surgery | Admitting: Neurological Surgery

## 2019-11-01 ENCOUNTER — Other Ambulatory Visit (HOSPITAL_COMMUNITY)
Admission: RE | Admit: 2019-11-01 | Discharge: 2019-11-01 | Disposition: A | Discharge status: Home / Self Care | Payer: Medicare Other | Source: Ambulatory Visit | Attending: Neurological Surgery | Admitting: Neurological Surgery

## 2019-11-01 ENCOUNTER — Other Ambulatory Visit: Payer: Self-pay

## 2019-11-01 ENCOUNTER — Encounter (HOSPITAL_COMMUNITY): Payer: Self-pay

## 2019-11-01 DIAGNOSIS — Z20822 Contact with and (suspected) exposure to covid-19: Secondary | ICD-10-CM | POA: Insufficient documentation

## 2019-11-01 DIAGNOSIS — Z01812 Encounter for preprocedural laboratory examination: Secondary | ICD-10-CM | POA: Diagnosis present

## 2019-11-01 LAB — BASIC METABOLIC PANEL
Anion gap: 7 (ref 5–15)
BUN: 12 mg/dL (ref 6–20)
CO2: 24 mmol/L (ref 22–32)
Calcium: 9 mg/dL (ref 8.9–10.3)
Chloride: 106 mmol/L (ref 98–111)
Creatinine, Ser: 0.73 mg/dL (ref 0.44–1.00)
GFR calc Af Amer: >60 mL/min (ref >60)
GFR calc non Af Amer: >60 mL/min (ref >60)
Glucose, Bld: 92 mg/dL (ref 70–99)
Potassium: 4.1 mmol/L (ref 3.5–5.1)
Sodium: 137 mmol/L (ref 135–145)

## 2019-11-01 LAB — CBC
HCT: 44.6 % (ref 36.0–46.0)
Hemoglobin: 14.6 g/dL (ref 12.0–15.0)
MCH: 31.1 pg (ref 26.0–34.0)
MCHC: 32.7 g/dL (ref 30.0–36.0)
MCV: 94.9 fL (ref 80.0–100.0)
Platelets: 268 10*3/uL (ref 150–400)
RBC: 4.7 MIL/uL (ref 3.87–5.11)
RDW: 13.7 % (ref 11.5–15.5)
WBC: 8.8 10*3/uL (ref 4.0–10.5)
nRBC: 0 % (ref 0.0–0.2)

## 2019-11-01 LAB — SURGICAL PCR SCREEN
MRSA, PCR: NEGATIVE
Staphylococcus aureus: NEGATIVE

## 2019-11-01 LAB — TYPE AND SCREEN
ABO/RH(D): O POS
Antibody Screen: NEGATIVE

## 2019-11-01 LAB — SARS CORONAVIRUS 2 (TAT 6-24 HRS): SARS Coronavirus 2: NEGATIVE

## 2019-11-01 NOTE — Progress Notes (Signed)
PCP Cindie Laroche, MD Cardiologist - pt denies  Chest x-ray - n/a EKG - n/a Stress Test - 2004 ECHO - pt denies Cardiac Cath - pt denies   COVID TEST- yes after PAT appt 11/01/19  Coronavirus Screening  Have you experienced the following symptoms:  Cough yes/no: No Fever (>100.60F)  yes/no: No Runny nose yes/no: No Sore throat yes/no: No Difficulty breathing/shortness of breath  yes/no: No  Have you or a family member traveled in the last 14 days and where? yes/no: No   If the patient indicates "YES" to the above questions, their PAT will be rescheduled to limit the exposure to others and, the surgeon will be notified. THE PATIENT WILL NEED TO BE ASYMPTOMATIC FOR 14 DAYS.   If the patient is not experiencing any of these symptoms, the PAT nurse will instruct them to NOT bring anyone with them to their appointment since they may have these symptoms or traveled as well.   Please remind your patients and families that hospital visitation restrictions are in effect and the importance of the restrictions.     Anesthesia review: n/a  Patient denies shortness of breath, fever, cough and chest pain at PAT appointment   All instructions explained to the patient, with a verbal understanding of the material. Patient agrees to go over the instructions while at home for a better understanding. Patient also instructed to self quarantine after being tested for COVID-19. The opportunity to ask questions was provided.

## 2019-11-05 ENCOUNTER — Inpatient Hospital Stay (HOSPITAL_COMMUNITY): Payer: Medicare Other | Admitting: Anesthesiology

## 2019-11-05 ENCOUNTER — Encounter (HOSPITAL_COMMUNITY): Payer: Self-pay | Admitting: Neurological Surgery

## 2019-11-05 ENCOUNTER — Encounter (HOSPITAL_COMMUNITY)
Admission: RE | Disposition: A | Discharge status: Home Health | Payer: Self-pay | Source: Home / Self Care | Attending: Neurological Surgery

## 2019-11-05 ENCOUNTER — Inpatient Hospital Stay (HOSPITAL_COMMUNITY): Payer: Medicare Other

## 2019-11-05 ENCOUNTER — Other Ambulatory Visit: Payer: Self-pay

## 2019-11-05 ENCOUNTER — Inpatient Hospital Stay (HOSPITAL_COMMUNITY)
Admission: RE | Admit: 2019-11-05 | Discharge: 2019-11-06 | DRG: 460 | Disposition: A | Discharge status: Home Health | Payer: Medicare Other | Attending: Neurological Surgery | Admitting: Neurological Surgery

## 2019-11-05 ENCOUNTER — Inpatient Hospital Stay (HOSPITAL_COMMUNITY): Payer: Medicare Other | Admitting: Physician Assistant

## 2019-11-05 DIAGNOSIS — Z87891 Personal history of nicotine dependence: Secondary | ICD-10-CM

## 2019-11-05 DIAGNOSIS — M48061 Spinal stenosis, lumbar region without neurogenic claudication: Secondary | ICD-10-CM | POA: Diagnosis present

## 2019-11-05 DIAGNOSIS — Z981 Arthrodesis status: Secondary | ICD-10-CM

## 2019-11-05 DIAGNOSIS — Z79890 Hormone replacement therapy (postmenopausal): Secondary | ICD-10-CM

## 2019-11-05 DIAGNOSIS — M5416 Radiculopathy, lumbar region: Secondary | ICD-10-CM | POA: Diagnosis present

## 2019-11-05 DIAGNOSIS — K08109 Complete loss of teeth, unspecified cause, unspecified class: Secondary | ICD-10-CM | POA: Diagnosis present

## 2019-11-05 DIAGNOSIS — M1990 Unspecified osteoarthritis, unspecified site: Secondary | ICD-10-CM | POA: Diagnosis present

## 2019-11-05 DIAGNOSIS — J45909 Unspecified asthma, uncomplicated: Secondary | ICD-10-CM | POA: Diagnosis present

## 2019-11-05 DIAGNOSIS — Z79899 Other long term (current) drug therapy: Secondary | ICD-10-CM

## 2019-11-05 DIAGNOSIS — Z419 Encounter for procedure for purposes other than remedying health state, unspecified: Secondary | ICD-10-CM

## 2019-11-05 DIAGNOSIS — M48062 Spinal stenosis, lumbar region with neurogenic claudication: Secondary | ICD-10-CM | POA: Diagnosis present

## 2019-11-05 DIAGNOSIS — E785 Hyperlipidemia, unspecified: Secondary | ICD-10-CM | POA: Diagnosis present

## 2019-11-05 HISTORY — PX: ANTERIOR LAT LUMBAR FUSION: SHX1168

## 2019-11-05 SURGERY — ANTERIOR LATERAL LUMBAR FUSION 1 LEVEL
Anesthesia: General | Site: Spine Lumbar

## 2019-11-05 MED ORDER — BUSPIRONE HCL 5 MG PO TABS
5.0000 mg | ORAL_TABLET | Freq: Three times a day (TID) | ORAL | Status: DC
  Administered 2019-11-05 – 2019-11-06 (×3): 5 mg via ORAL
  Filled 2019-11-05 (×5): qty 1

## 2019-11-05 MED ORDER — METHOCARBAMOL 1000 MG/10ML IJ SOLN
500.0000 mg | Freq: Four times a day (QID) | INTRAVENOUS | Status: DC | PRN
  Filled 2019-11-05: qty 5

## 2019-11-05 MED ORDER — MEPERIDINE HCL 25 MG/ML IJ SOLN: 6.2500 mg | INTRAMUSCULAR | Status: DC | PRN

## 2019-11-05 MED ORDER — ACETAMINOPHEN 650 MG RE SUPP: 650.0000 mg | RECTAL | Status: DC | PRN

## 2019-11-05 MED ORDER — BUPIVACAINE HCL (PF) 0.5 % IJ SOLN
INTRAMUSCULAR | Status: AC
  Filled 2019-11-05: qty 30

## 2019-11-05 MED ORDER — PROPOFOL 10 MG/ML IV BOLUS
INTRAVENOUS | Status: AC
  Filled 2019-11-05: qty 20

## 2019-11-05 MED ORDER — ACETAMINOPHEN 10 MG/ML IV SOLN: 1000.0000 mg | Freq: Once | INTRAVENOUS | Status: DC | PRN

## 2019-11-05 MED ORDER — DOCUSATE SODIUM 100 MG PO CAPS
100.0000 mg | ORAL_CAPSULE | Freq: Two times a day (BID) | ORAL | Status: DC
  Administered 2019-11-05: 100 mg via ORAL
  Filled 2019-11-05 (×2): qty 1

## 2019-11-05 MED ORDER — BISACODYL 10 MG RE SUPP: 10.0000 mg | Freq: Every day | RECTAL | Status: DC | PRN

## 2019-11-05 MED ORDER — PROMETHAZINE HCL 25 MG/ML IJ SOLN: 6.2500 mg | INTRAMUSCULAR | Status: DC | PRN

## 2019-11-05 MED ORDER — FLUOXETINE HCL 20 MG PO CAPS
40.0000 mg | ORAL_CAPSULE | Freq: Every day | ORAL | Status: DC
  Administered 2019-11-06: 09:00:00 40 mg via ORAL
  Filled 2019-11-05: qty 2

## 2019-11-05 MED ORDER — DICYCLOMINE HCL 10 MG PO CAPS
10.0000 mg | ORAL_CAPSULE | Freq: Three times a day (TID) | ORAL | Status: DC
  Administered 2019-11-05: 18:00:00 10 mg via ORAL
  Filled 2019-11-05 (×2): qty 1

## 2019-11-05 MED ORDER — MENTHOL 3 MG MT LOZG: 1.0000 | LOZENGE | OROMUCOSAL | Status: DC | PRN

## 2019-11-05 MED ORDER — LACTATED RINGERS IV SOLN: INTRAVENOUS | Status: DC

## 2019-11-05 MED ORDER — CEFAZOLIN SODIUM-DEXTROSE 2-4 GM/100ML-% IV SOLN
2.0000 g | Freq: Three times a day (TID) | INTRAVENOUS | Status: AC
  Administered 2019-11-05 (×2): 2 g via INTRAVENOUS
  Filled 2019-11-05 (×2): qty 100

## 2019-11-05 MED ORDER — DEXAMETHASONE SODIUM PHOSPHATE 10 MG/ML IJ SOLN
INTRAMUSCULAR | Status: AC
  Filled 2019-11-05: qty 1

## 2019-11-05 MED ORDER — ACETAMINOPHEN 325 MG PO TABS: 325.0000 mg | ORAL_TABLET | Freq: Once | ORAL | Status: DC | PRN

## 2019-11-05 MED ORDER — SUCCINYLCHOLINE CHLORIDE 200 MG/10ML IV SOSY
PREFILLED_SYRINGE | INTRAVENOUS | Status: AC
  Filled 2019-11-05: qty 10

## 2019-11-05 MED ORDER — 0.9 % SODIUM CHLORIDE (POUR BTL) OPTIME
TOPICAL | Status: DC | PRN
  Administered 2019-11-05: 1000 mL

## 2019-11-05 MED ORDER — HYDROMORPHONE HCL 1 MG/ML IJ SOLN
INTRAMUSCULAR | Status: AC
  Filled 2019-11-05: qty 1

## 2019-11-05 MED ORDER — LIDOCAINE 2% (20 MG/ML) 5 ML SYRINGE
INTRAMUSCULAR | Status: DC | PRN
  Administered 2019-11-05: 40 mg via INTRAVENOUS

## 2019-11-05 MED ORDER — METHOCARBAMOL 500 MG PO TABS
500.0000 mg | ORAL_TABLET | Freq: Four times a day (QID) | ORAL | Status: DC | PRN
  Administered 2019-11-05 – 2019-11-06 (×4): 500 mg via ORAL
  Filled 2019-11-05 (×3): qty 1

## 2019-11-05 MED ORDER — SODIUM CHLORIDE 0.9 % IV SOLN
INTRAVENOUS | Status: DC | PRN
  Administered 2019-11-05: 500 mL

## 2019-11-05 MED ORDER — LACTATED RINGERS IV SOLN: INTRAVENOUS | Status: DC | PRN

## 2019-11-05 MED ORDER — KETOROLAC TROMETHAMINE 15 MG/ML IJ SOLN
15.0000 mg | Freq: Four times a day (QID) | INTRAMUSCULAR | Status: AC
  Administered 2019-11-05 – 2019-11-06 (×4): 15 mg via INTRAVENOUS
  Filled 2019-11-05 (×4): qty 1

## 2019-11-05 MED ORDER — CHLORHEXIDINE GLUCONATE CLOTH 2 % EX PADS: 6.0000 | MEDICATED_PAD | Freq: Once | CUTANEOUS | Status: DC

## 2019-11-05 MED ORDER — ONDANSETRON HCL 4 MG/2ML IJ SOLN
INTRAMUSCULAR | Status: AC
  Filled 2019-11-05: qty 2

## 2019-11-05 MED ORDER — THROMBIN 5000 UNITS EX SOLR
CUTANEOUS | Status: AC
  Filled 2019-11-05: qty 5000

## 2019-11-05 MED ORDER — ACETAMINOPHEN 10 MG/ML IV SOLN
INTRAVENOUS | Status: AC
  Filled 2019-11-05: qty 100

## 2019-11-05 MED ORDER — POLYETHYLENE GLYCOL 3350 17 G PO PACK: 17.0000 g | PACK | Freq: Every day | ORAL | Status: DC | PRN

## 2019-11-05 MED ORDER — ONDANSETRON HCL 4 MG/2ML IJ SOLN
INTRAMUSCULAR | Status: DC | PRN
  Administered 2019-11-05: 4 mg via INTRAVENOUS

## 2019-11-05 MED ORDER — LORATADINE 10 MG PO TABS
10.0000 mg | ORAL_TABLET | Freq: Every day | ORAL | Status: DC
  Administered 2019-11-06: 10 mg via ORAL
  Filled 2019-11-05: qty 1

## 2019-11-05 MED ORDER — ACETAMINOPHEN 325 MG PO TABS: 650.0000 mg | ORAL_TABLET | ORAL | Status: DC | PRN

## 2019-11-05 MED ORDER — LIDOCAINE-EPINEPHRINE 1 %-1:100000 IJ SOLN
INTRAMUSCULAR | Status: AC
  Filled 2019-11-05: qty 1

## 2019-11-05 MED ORDER — DEXAMETHASONE SODIUM PHOSPHATE 10 MG/ML IJ SOLN
INTRAMUSCULAR | Status: DC | PRN
  Administered 2019-11-05: 10 mg via INTRAVENOUS

## 2019-11-05 MED ORDER — FLEET ENEMA 7-19 GM/118ML RE ENEM: 1.0000 | ENEMA | Freq: Once | RECTAL | Status: DC | PRN

## 2019-11-05 MED ORDER — DIAZEPAM 5 MG/ML IJ SOLN
INTRAMUSCULAR | Status: AC
  Filled 2019-11-05: qty 2

## 2019-11-05 MED ORDER — DIAZEPAM 5 MG/ML IJ SOLN
5.0000 mg | Freq: Once | INTRAMUSCULAR | Status: AC
  Administered 2019-11-05: 11:00:00 5 mg via INTRAVENOUS

## 2019-11-05 MED ORDER — ONDANSETRON HCL 4 MG/2ML IJ SOLN: 4.0000 mg | Freq: Four times a day (QID) | INTRAMUSCULAR | Status: DC | PRN

## 2019-11-05 MED ORDER — TRAZODONE HCL 100 MG PO TABS
200.0000 mg | ORAL_TABLET | Freq: Every day | ORAL | Status: DC
  Administered 2019-11-05: 21:00:00 200 mg via ORAL
  Filled 2019-11-05 (×2): qty 2

## 2019-11-05 MED ORDER — PHENYLEPHRINE HCL-NACL 10-0.9 MG/250ML-% IV SOLN
INTRAVENOUS | Status: DC | PRN
  Administered 2019-11-05: 25 ug/min via INTRAVENOUS

## 2019-11-05 MED ORDER — SODIUM CHLORIDE 0.9% FLUSH: 3.0000 mL | Freq: Two times a day (BID) | INTRAVENOUS | Status: DC

## 2019-11-05 MED ORDER — KETAMINE HCL 50 MG/5ML IJ SOSY
PREFILLED_SYRINGE | INTRAMUSCULAR | Status: AC
  Filled 2019-11-05: qty 5

## 2019-11-05 MED ORDER — KETAMINE HCL 10 MG/ML IJ SOLN
INTRAMUSCULAR | Status: DC | PRN
  Administered 2019-11-05: 10 mg via INTRAVENOUS
  Administered 2019-11-05: 30 mg via INTRAVENOUS

## 2019-11-05 MED ORDER — LIDOCAINE IN D5W 4-5 MG/ML-% IV SOLN
INTRAVENOUS | Status: DC | PRN
  Administered 2019-11-05: 25 ug/kg/min via INTRAVENOUS

## 2019-11-05 MED ORDER — FENTANYL CITRATE (PF) 250 MCG/5ML IJ SOLN
INTRAMUSCULAR | Status: AC
  Filled 2019-11-05: qty 5

## 2019-11-05 MED ORDER — PROPOFOL 10 MG/ML IV BOLUS
INTRAVENOUS | Status: DC | PRN
  Administered 2019-11-05: 100 mg via INTRAVENOUS
  Administered 2019-11-05: 30 mg via INTRAVENOUS
  Administered 2019-11-05: 50 mg via INTRAVENOUS
  Administered 2019-11-05: 20 mg via INTRAVENOUS

## 2019-11-05 MED ORDER — PHENYLEPHRINE 40 MCG/ML (10ML) SYRINGE FOR IV PUSH (FOR BLOOD PRESSURE SUPPORT)
PREFILLED_SYRINGE | INTRAVENOUS | Status: AC
  Filled 2019-11-05: qty 10

## 2019-11-05 MED ORDER — BUPIVACAINE HCL (PF) 0.5 % IJ SOLN
INTRAMUSCULAR | Status: DC | PRN
  Administered 2019-11-05: 5 mL

## 2019-11-05 MED ORDER — SODIUM CHLORIDE 0.9% FLUSH: 3.0000 mL | INTRAVENOUS | Status: DC | PRN

## 2019-11-05 MED ORDER — LIDOCAINE-EPINEPHRINE 1 %-1:100000 IJ SOLN
INTRAMUSCULAR | Status: DC | PRN
  Administered 2019-11-05: 5 mL

## 2019-11-05 MED ORDER — GABAPENTIN 300 MG PO CAPS: 300.0000 mg | ORAL_CAPSULE | Freq: Three times a day (TID) | ORAL | Status: DC | PRN

## 2019-11-05 MED ORDER — CEFAZOLIN SODIUM-DEXTROSE 2-4 GM/100ML-% IV SOLN
2.0000 g | INTRAVENOUS | Status: AC
  Administered 2019-11-05: 08:00:00 2 g via INTRAVENOUS
  Filled 2019-11-05: qty 100

## 2019-11-05 MED ORDER — MORPHINE SULFATE (PF) 2 MG/ML IV SOLN: 2.0000 mg | INTRAVENOUS | Status: DC | PRN

## 2019-11-05 MED ORDER — SODIUM CHLORIDE 0.9 % IV SOLN: 250.0000 mL | INTRAVENOUS | Status: DC

## 2019-11-05 MED ORDER — METHOCARBAMOL 500 MG PO TABS
ORAL_TABLET | ORAL | Status: AC
  Filled 2019-11-05: qty 1

## 2019-11-05 MED ORDER — THROMBIN 5000 UNITS EX SOLR
OROMUCOSAL | Status: DC | PRN
  Administered 2019-11-05: 5 mL via TOPICAL

## 2019-11-05 MED ORDER — LIDOCAINE 2% (20 MG/ML) 5 ML SYRINGE
INTRAMUSCULAR | Status: AC
  Filled 2019-11-05: qty 5

## 2019-11-05 MED ORDER — HYDROMORPHONE HCL 1 MG/ML IJ SOLN
0.2500 mg | INTRAMUSCULAR | Status: DC | PRN
  Administered 2019-11-05 (×2): 0.5 mg via INTRAVENOUS

## 2019-11-05 MED ORDER — PHENYLEPHRINE 40 MCG/ML (10ML) SYRINGE FOR IV PUSH (FOR BLOOD PRESSURE SUPPORT)
PREFILLED_SYRINGE | INTRAVENOUS | Status: DC | PRN
  Administered 2019-11-05 (×2): 120 ug via INTRAVENOUS
  Administered 2019-11-05: 80 ug via INTRAVENOUS

## 2019-11-05 MED ORDER — OXYCODONE HCL 5 MG PO TABS
ORAL_TABLET | ORAL | Status: AC
  Filled 2019-11-05: qty 1

## 2019-11-05 MED ORDER — ACETAMINOPHEN 160 MG/5ML PO SOLN: 325.0000 mg | Freq: Once | ORAL | Status: DC | PRN

## 2019-11-05 MED ORDER — ONDANSETRON HCL 4 MG PO TABS: 4.0000 mg | ORAL_TABLET | Freq: Four times a day (QID) | ORAL | Status: DC | PRN

## 2019-11-05 MED ORDER — MIDAZOLAM HCL 5 MG/5ML IJ SOLN
INTRAMUSCULAR | Status: DC | PRN
  Administered 2019-11-05: 2 mg via INTRAVENOUS

## 2019-11-05 MED ORDER — TIZANIDINE HCL 4 MG PO TABS: 4.0000 mg | ORAL_TABLET | Freq: Two times a day (BID) | ORAL | Status: DC | PRN

## 2019-11-05 MED ORDER — SUCCINYLCHOLINE CHLORIDE 200 MG/10ML IV SOSY
PREFILLED_SYRINGE | INTRAVENOUS | Status: DC | PRN
Start: 2019-11-05 — End: 2019-11-05
  Administered 2019-11-05: 140 mg via INTRAVENOUS

## 2019-11-05 MED ORDER — OXYCODONE-ACETAMINOPHEN 5-325 MG PO TABS
1.0000 | ORAL_TABLET | ORAL | Status: DC | PRN
  Administered 2019-11-05 – 2019-11-06 (×5): 2 via ORAL
  Filled 2019-11-05 (×5): qty 2

## 2019-11-05 MED ORDER — PHENOL 1.4 % MT LIQD: 1.0000 | OROMUCOSAL | Status: DC | PRN

## 2019-11-05 MED ORDER — OXYCODONE HCL 5 MG PO TABS
5.0000 mg | ORAL_TABLET | ORAL | Status: DC | PRN
  Administered 2019-11-05: 12:00:00 5 mg via ORAL

## 2019-11-05 MED ORDER — ALUM & MAG HYDROXIDE-SIMETH 200-200-20 MG/5ML PO SUSP: 30.0000 mL | Freq: Four times a day (QID) | ORAL | Status: DC | PRN

## 2019-11-05 MED ORDER — AMPHETAMINE-DEXTROAMPHETAMINE 10 MG PO TABS
20.0000 mg | ORAL_TABLET | Freq: Every day | ORAL | Status: DC
  Administered 2019-11-06: 20 mg via ORAL
  Filled 2019-11-05: qty 2

## 2019-11-05 MED ORDER — SENNA 8.6 MG PO TABS
1.0000 | ORAL_TABLET | Freq: Two times a day (BID) | ORAL | Status: DC
  Administered 2019-11-05: 8.6 mg via ORAL
  Filled 2019-11-05: qty 1

## 2019-11-05 MED ORDER — MIDAZOLAM HCL 2 MG/2ML IJ SOLN
INTRAMUSCULAR | Status: AC
  Filled 2019-11-05: qty 2

## 2019-11-05 MED ORDER — FENTANYL CITRATE (PF) 250 MCG/5ML IJ SOLN
INTRAMUSCULAR | Status: DC | PRN
  Administered 2019-11-05 (×5): 50 ug via INTRAVENOUS
  Administered 2019-11-05: 25 ug via INTRAVENOUS
  Administered 2019-11-05: 50 ug via INTRAVENOUS
  Administered 2019-11-05: 125 ug via INTRAVENOUS
  Administered 2019-11-05: 50 ug via INTRAVENOUS

## 2019-11-05 MED ORDER — ROCURONIUM BROMIDE 10 MG/ML (PF) SYRINGE
PREFILLED_SYRINGE | INTRAVENOUS | Status: AC
  Filled 2019-11-05: qty 10

## 2019-11-05 MED ORDER — ESTRADIOL 1 MG PO TABS
2.0000 mg | ORAL_TABLET | Freq: Every day | ORAL | Status: DC
  Administered 2019-11-05: 21:00:00 2 mg via ORAL
  Filled 2019-11-05: qty 2
  Filled 2019-11-05 (×2): qty 1
  Filled 2019-11-05: qty 2

## 2019-11-05 MED ORDER — ACETAMINOPHEN 10 MG/ML IV SOLN
INTRAVENOUS | Status: DC | PRN
  Administered 2019-11-05: 1000 mg via INTRAVENOUS

## 2019-11-05 MED ORDER — PRAVASTATIN SODIUM 40 MG PO TABS
40.0000 mg | ORAL_TABLET | Freq: Every day | ORAL | Status: DC
  Administered 2019-11-05: 21:00:00 40 mg via ORAL
  Filled 2019-11-05: qty 1

## 2019-11-05 SURGICAL SUPPLY — 56 items
ADH SKN CLS APL DERMABOND .7 (GAUZE/BANDAGES/DRESSINGS) ×1
BLADE CLIPPER SURG (BLADE) IMPLANT
BOLT 5.0X45 SPINAL (Bolt) ×2 IMPLANT
BOLT 5.0X50 SPINAL (Bolt) ×2 IMPLANT
BONE MATRIX OSTEOCEL PRO MED (Bone Implant) ×2 IMPLANT
COVER MAYO STAND STRL (DRAPES) IMPLANT
COVER WAND RF STERILE (DRAPES) ×1 IMPLANT
DERMABOND ADVANCED (GAUZE/BANDAGES/DRESSINGS) ×2
DERMABOND ADVANCED .7 DNX12 (GAUZE/BANDAGES/DRESSINGS) ×1 IMPLANT
DRAPE C-ARM 42X72 X-RAY (DRAPES) ×3 IMPLANT
DRAPE C-ARMOR (DRAPES) ×3 IMPLANT
DRAPE HALF SHEET 40X57 (DRAPES) ×2 IMPLANT
DRAPE LAPAROTOMY 100X72X124 (DRAPES) ×3 IMPLANT
DURAPREP 26ML APPLICATOR (WOUND CARE) ×3 IMPLANT
ELECT REM PT RETURN 9FT ADLT (ELECTROSURGICAL) ×3
ELECTRODE REM PT RTRN 9FT ADLT (ELECTROSURGICAL) ×1 IMPLANT
GAUZE 4X4 16PLY RFD (DISPOSABLE) IMPLANT
GLOVE BIOGEL PI IND STRL 6.5 (GLOVE) IMPLANT
GLOVE BIOGEL PI IND STRL 7.0 (GLOVE) IMPLANT
GLOVE BIOGEL PI IND STRL 8.5 (GLOVE) ×1 IMPLANT
GLOVE BIOGEL PI INDICATOR 6.5 (GLOVE) ×4
GLOVE BIOGEL PI INDICATOR 7.0 (GLOVE) ×4
GLOVE BIOGEL PI INDICATOR 8.5 (GLOVE) ×2
GLOVE ECLIPSE 7.0 STRL STRAW (GLOVE) ×2 IMPLANT
GLOVE ECLIPSE 8.5 STRL (GLOVE) ×3 IMPLANT
GLOVE EXAM NITRILE XL STR (GLOVE) IMPLANT
GLOVE SURG SS PI 6.0 STRL IVOR (GLOVE) ×8 IMPLANT
GLOVE SURG SS PI 7.0 STRL IVOR (GLOVE) ×4 IMPLANT
GOWN STRL REUS W/ TWL LRG LVL3 (GOWN DISPOSABLE) IMPLANT
GOWN STRL REUS W/ TWL XL LVL3 (GOWN DISPOSABLE) ×1 IMPLANT
GOWN STRL REUS W/TWL 2XL LVL3 (GOWN DISPOSABLE) ×3 IMPLANT
GOWN STRL REUS W/TWL LRG LVL3 (GOWN DISPOSABLE) ×12
GOWN STRL REUS W/TWL XL LVL3 (GOWN DISPOSABLE)
HEMOSTAT POWDER KIT SURGIFOAM (HEMOSTASIS) ×2 IMPLANT
KIT BASIN OR (CUSTOM PROCEDURE TRAY) ×3 IMPLANT
KIT DILATOR XLIF 5 (KITS) ×1 IMPLANT
KIT SURGICAL ACCESS MAXCESS 4 (KITS) ×2 IMPLANT
KIT TURNOVER KIT B (KITS) ×3 IMPLANT
KIT XLIF (KITS) ×1
MODULE NVM5 NEXT GEN EMG (NEEDLE) ×2 IMPLANT
MODULUS XLW 12X22X50MM 10DEG (Spine Construct) ×2 IMPLANT
NDL HYPO 25X1 1.5 SAFETY (NEEDLE) ×1 IMPLANT
NEEDLE HYPO 25X1 1.5 SAFETY (NEEDLE) ×3 IMPLANT
NS IRRIG 1000ML POUR BTL (IV SOLUTION) ×3 IMPLANT
PACK LAMINECTOMY NEURO (CUSTOM PROCEDURE TRAY) ×3 IMPLANT
PLATE DECADE 4HOLE SZ12 XLIF (Plate) ×2 IMPLANT
SCREW DECADE 5.5X50 (Screw) ×4 IMPLANT
SPONGE LAP 4X18 RFD (DISPOSABLE) IMPLANT
SUT VIC AB 2-0 CP2 18 (SUTURE) ×5 IMPLANT
SUT VIC AB 3-0 SH 8-18 (SUTURE) ×3 IMPLANT
SUT VIC AB 4-0 RB1 18 (SUTURE) ×2 IMPLANT
TAPE CLOTH 4X10 WHT NS (GAUZE/BANDAGES/DRESSINGS) ×8 IMPLANT
TOWEL GREEN STERILE (TOWEL DISPOSABLE) ×3 IMPLANT
TOWEL GREEN STERILE FF (TOWEL DISPOSABLE) ×3 IMPLANT
TRAY FOLEY MTR SLVR 16FR STAT (SET/KITS/TRAYS/PACK) ×3 IMPLANT
WATER STERILE IRR 1000ML POUR (IV SOLUTION) ×3 IMPLANT

## 2019-11-05 NOTE — Anesthesia Procedure Notes (Signed)
Procedure Name: Intubation Date/Time: 11/05/2019 8:03 AM Performed by: Harden Mo, CRNA Pre-anesthesia Checklist: Patient identified, Emergency Drugs available, Suction available and Patient being monitored Patient Re-evaluated:Patient Re-evaluated prior to induction Oxygen Delivery Method: Circle System Utilized Preoxygenation: Pre-oxygenation with 100% oxygen Induction Type: IV induction Ventilation: Mask ventilation without difficulty Laryngoscope Size: Miller and 2 Grade View: Grade I Tube type: Oral Tube size: 7.0 mm Number of attempts: 1 Airway Equipment and Method: Stylet and Oral airway Placement Confirmation: ETT inserted through vocal cords under direct vision,  positive ETCO2 and breath sounds checked- equal and bilateral Secured at: 22 cm Tube secured with: Tape Dental Injury: Teeth and Oropharynx as per pre-operative assessment

## 2019-11-05 NOTE — Transfer of Care (Signed)
Immediate Anesthesia Transfer of Care Note  Patient: [name]  Procedure(s) Performed: Lumbar Four-Five Anterolateral lumbar interbody fusion with lateral plate (N/A Spine Lumbar)  Patient Location: PACU  Anesthesia Type:General  Level of Consciousness: awake and alert   Airway & Oxygen Therapy: Patient Spontanous Breathing and Patient connected to nasal cannula oxygen  Post-op Assessment: Report given to RN, Post -op Vital signs reviewed and stable and Patient moving all extremities X 4  Post vital signs: Reviewed and stable  Last Vitals:  Vitals Value Taken Time  BP 119/70 11/05/19 1032  Temp    Pulse 97 11/05/19 1036  Resp 24 11/05/19 1034  SpO2 94 % 11/05/19 1036  Vitals shown include unvalidated device data.  Last Pain:  Vitals:   11/05/19 0617  TempSrc: Oral  PainSc: 10-Worst pain ever      Patients Stated Pain Goal: 3 ([id number])  Complications: No apparent anesthesia complications

## 2019-11-05 NOTE — Op Note (Signed)
Date of surgery: 11/05/2019 Preoperative diagnosis: Lumbar stenosis L3-L4, adjacent level disease fusion at L4-L5, transitional anatomy, lumbar radiculopathy left greater than right, neurogenic claudication. Postoperative diagnosis: Same Procedure: Anterolateral decompression L3-L4 (adjacent level to previous fusion labeled L4-L5) stabilization with XLIF spacer allograft arthrodesis using osteocell lateral plate fixation.  Fluoroscopic imaging.  Neuro monitoring with EMG. Surgeon: Kristeen Miss Anesthesia: General endotracheal Indications: [name] is a 59 year old individuals had significant lumbar stenosis and lumbar radiculopathy.  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  The patient was advised regarding the need for surgical decompression and stabilization.  Procedure the patient was brought to the operating room supine on stretcher after the smooth induction of general tracheal anesthesia and placement of a Foley catheter EMG electrodes were placed in the major muscle groups of the lower lumbar plexus.  Patient was then carefully turned to the left lateral decubitus position and the right side was positioned orthogonally which was checked radiographically her body was taped in the position and bony prominences were appropriately padded and protected.  The skin was marked for appropriate entry sites overlying the L3-4 disc space immediately adjacent to her fusion.  Skin was prepped with alcohol DuraPrep and draped in a sterile fashion.  Then a lateral incision was created after infiltrating with lidocaine with epinephrine mixed 50-50 with half percent Marcaine a second monitoring incision was created posteriorly.  The  fascia was noted to be thick in the depth and it was pierced with a Kelly clamp and then a narrow probe was guided over this region to be entered into the psoas muscle.  Radiographic confirmation of its position was obtained and it was guided through the monitoring incision posteriorly which allowed placement of the index finger towards the retroperitoneal space.  Then after stimulation yielded no irritability of the nerves of the lumbar plexus which did require repositioning of the initial probe a K wire was passed into the disc space at L3-L4.  A series of dilators was passed over this initial probe all the time checking for any irritability of the lumbar plexus nerves and none was identified ultimately 160 mm long cannula retractor was placed over the outer probe and this was secured to the operating table with a clamp it was initially dilated just a little to allow placement of light sources in the retractor bed and remove the inner cannulas.  Then by carefully stimulating around the posterior aspect of the posterior blade it was identified that no neural structures were immediately apparent and we placed a shim into the disc space at L3-L4.  This secured the clamp further and allowed opening ventrally sweeping the psoas muscle ventrally.  The area was carefully inspected and the lateral incision was then opened with a #15 blade a combination of curettes and rongeurs was used to evacuate a significant quantity of severely degenerated and desiccated disc material.  Then a Cobb elevator was passed superiorly and inferiorly to open the ligament on the contralateral aspect.  Further dissection yielded significant quantities of endplate material which were removed to assure that all the cartilaginous endplate was removed.  A series of dilators was then used to open the space first to the 8 mm x 22 mm size and 10 x  22 and ultimately 12 x 22 mm provided good distraction of the interspace.  A 12 mm x 22 mm x 50 mm  spacer with 10 degrees of lordosis was then packed with osteocell allograft and carefully tamped into the interspace.  Once this was seated appropriately a lateral plate was affixed over this region measuring 12 mm in height with 50 mm screws placed anteriorly and a 50 mm screw placed posteriorly at L4 and superiorly at L3 a 45 mm screw was placed.  These were locked into final position.  Final radiographs were obtained in [facility] and lateral projection EMG monitoring remained calm save for some irritability of the right sided vastus muscle.  The retractor was removed the wounds were inspected for hemostasis and the fascia was closed with 2-0 Vicryl in interrupted fashion 3-0 Vicryl was used in the subcu particular layer superiorly and the smallest layer was closed with 4-0 Vicryl.  Both incisions were closed in a similar fashion blood loss was estimated less than 50 cc.  Patient was returned to recovery room in stable condition after placing Dermabond over the incisions.

## 2019-11-05 NOTE — Progress Notes (Signed)
Patient ID: [name], female   DOB: Dec 03, 1960, 59 y.o.   MRN: [id number] Vital signs are stable Patient little rambunctious postoperatively Motor function appears good in the lower extremities Stable postop

## 2019-11-05 NOTE — Anesthesia Preprocedure Evaluation (Addendum)
Anesthesia Evaluation  Patient identified by MRN, date of birth, ID band Patient awake    Reviewed: Allergy & Precautions, NPO status , Patient's Chart, lab work & pertinent test results  Airway Mallampati: I  TM Distance: >3 FB Neck ROM: Full    Dental  (+) Edentulous Upper, Edentulous Lower, Dental Advisory Given   Pulmonary asthma , Patient abstained from smoking., former smoker,     + decreased breath sounds      Cardiovascular negative cardio ROS   Rhythm:Regular Rate:Normal     Neuro/Psych PSYCHIATRIC DISORDERS Anxiety Depression    GI/Hepatic negative GI ROS, Neg liver ROS,   Endo/Other  negative endocrine ROS  Renal/GU negative Renal ROS     Musculoskeletal  (+) Arthritis ,   Abdominal (+) + obese,   Peds  Hematology negative hematology ROS (+)   Anesthesia Other Findings   Reproductive/Obstetrics                           Anesthesia Physical Anesthesia Plan  ASA: III  Anesthesia Plan: General   Post-op Pain Management:    Induction: Intravenous  PONV Risk Score and Plan: 4 or greater and Ondansetron, Dexamethasone, Midazolam and Scopolamine patch - Pre-op  Airway Management Planned: Oral ETT  Additional Equipment: None  Intra-op Plan:   Post-operative Plan: Extubation in OR  Informed Consent: I have reviewed the patients History and Physical, chart, labs and discussed the procedure including the risks, benefits and alternatives for the proposed anesthesia with the patient or authorized representative who has indicated his/her understanding and acceptance.     Dental advisory given  Plan Discussed with: CRNA  Anesthesia Plan Comments: (Baseline Pain 10/10)       Anesthesia Quick Evaluation

## 2019-11-05 NOTE — H&P (Signed)
[name] is an 59 y.o. female.   Chief Complaint: Back and bilateral leg pain left worse than right HPI: Patient is a 59 year old individual whose had previous spondylolisthesis with decompression and fusion at what is labeled as the L5-S1 level but represents the second disc space above her sacrum.  She has a transitional level and on plain x-rays her fusion would be stated to be at L4-5 nonetheless her recent myelogram demonstrates a transitional level and that was labeled as L5-S1 the adjacent level immediately above her fusion has significant spondylitic stenosis and she is now admitted to undergo surgical decompression of this level which I have labeled in my notes is L3-4 but on radiographic examinations is labeled as L4-5.  Nonetheless she has significant left-sided leg pain from the spondylitic stenosis.  Surgery is planned via an anterolateral decompression and lateral plate fixation.  Past Medical History:  Diagnosis Date  . Anxiety   . Arthritis   . Asthma   . Depression   . Hyperlipemia     Past Surgical History:  Procedure Laterality Date  . ABDOMINAL HYSTERECTOMY    . BACK SURGERY     pins  . BACK SURGERY  2010  . BLADDER REPAIR    . CESAREAN SECTION  1982, 1984  . COLONOSCOPY N/A 06/12/2013   Procedure: COLONOSCOPY;  Surgeon: Rogene Houston, MD;  Location: [facility];  Service: Endoscopy;  Laterality: N/A;  930-moved to 1020 Ann to notify pt  . KNEE ARTHROSCOPY WITH MEDIAL MENISECTOMY Right 05/09/2019   Procedure: KNEE ARTHROSCOPY WITH MEDIAL MENISECTOMY AND LATERAL MENISECTOMY;  Surgeon: Carole Civil, MD;  Location: [facility]S;  Service: Orthopedics;  Laterality: Right;  . KNEE ARTHROSCOPY WITH MEDIAL MENISECTOMY Left 06/27/2019   Procedure: KNEE ARTHROSCOPY WITH MEDIAL MENISCECTOMY;  Surgeon: Carole Civil, MD;  Location: [facility];  Service: Orthopedics;  Laterality: Left;    Family History  Problem Relation Age of Onset  . Colon cancer Other     Social History:  reports that she has quit smoking. Her smoking use included cigarettes. She has a 20.00 pack-year smoking history. She has never used smokeless tobacco. She reports that she does not drink alcohol or use drugs.  Allergies: No Known Allergies  Medications Prior to Admission  Medication Sig Dispense Refill  . amphetamine-dextroamphetamine (ADDERALL) 20 MG tablet Take 20 mg by mouth daily.     . Aspirin-Acetaminophen-Caffeine (GOODY HEADACHE PO) Take 1 packet by mouth daily as needed (headaches).    . busPIRone (BUSPAR) 10 MG tablet Take 5 mg by mouth 3 (three) times daily.    . Cholecalciferol (VITAMIN D3) 50 MCG (2000 UT) capsule Take 2,000 Units by mouth daily.     Marland Kitchen estradiol (ESTRACE) 2 MG tablet TAKE ONE TABLET AT BEDTIME (Patient taking differently: Take 2 mg by mouth at bedtime. ) 30 tablet 11  . FLUoxetine (PROZAC) 40 MG capsule Take 40 mg by mouth daily.    Marland Kitchen gabapentin (NEURONTIN) 300 MG capsule Take 1 capsule (300 mg total) by mouth 3 (three) times daily. (Patient taking differently: Take 300 mg by mouth 3 (three) times daily as needed (pain). ) 21 capsule 3  . loratadine (CLARITIN) 10 MG tablet Take 10 mg by mouth daily.    . naproxen (NAPROSYN) 500 MG tablet Take 500 mg by mouth 2 (two) times daily with a meal.    . oxyCODONE (OXY IR/ROXICODONE) 5 MG immediate release tablet Take 1 tablet (5 mg total) by mouth every  4 (four) hours as needed for severe pain. 30 tablet 0  . pravastatin (PRAVACHOL) 40 MG tablet Take 40 mg by mouth at bedtime.     Marland Kitchen tiZANidine (ZANAFLEX) 4 MG tablet Take 1 tablet (4 mg total) by mouth 3 (three) times daily. (Patient taking differently: Take 4 mg by mouth 2 (two) times daily as needed for muscle spasms. ) 21 tablet 3  . traZODone (DESYREL) 100 MG tablet Take 200 mg by mouth at bedtime.    . dicyclomine (BENTYL) 10 MG capsule TAKE  (1)  CAPSULE THREE TIMES DAILY BEFORE MEALS.  (TAKE AT [place]) (Patient not taking: No  sig reported) 90 capsule 2  . predniSONE (STERAPRED UNI-PAK 48 TAB) 10 MG (48) TBPK tablet Take by mouth daily. (Patient not taking: Reported on 08/26/2019) 48 tablet 0    No results found for this or any previous visit (from the past 48 hour(s)). No results found.  Review of Systems  Constitutional: Positive for activity change.  HENT: Negative.   Eyes: Negative.   Respiratory: Negative.   Cardiovascular: Negative.   Gastrointestinal: Negative.   Endocrine: Negative.   Genitourinary: Negative.   Musculoskeletal: Positive for back pain.  Allergic/Immunologic: Negative.   Neurological: Positive for weakness and numbness.  Hematological: Negative.   Psychiatric/Behavioral: Negative.     Blood pressure 128/67, pulse 71, temperature 97.8 F (36.6 C), temperature source Oral, resp. rate 18, height 5\' 4"  (1.626 m), weight 109.6 kg, SpO2 98 %. Physical Exam  Constitutional: She is oriented to person, place, and time. She appears well-developed and well-nourished.  HENT:  Head: Normocephalic and atraumatic.  Eyes: Pupils are equal, round, and reactive to light. Conjunctivae and EOM are normal.  Cardiovascular: Normal rate and regular rhythm.  Respiratory: Effort normal and breath sounds normal.  GI: Soft. Bowel sounds are normal.  Musculoskeletal:     Cervical back: Normal range of motion and neck supple.     Comments: Positive straight leg raising examination at 30 degrees in either lower extremity Patrick's maneuver is negative bilaterally palpation and percussion of the lower lumbar spine reproduces some modest tenderness in the lumbar spine all the way up to the thoracolumbar junction  Neurological: She is alert and oriented to person, place, and time.  Absent deep tendon reflexes in the lower extremities.  Weakness in tibialis anterior graded 4- out of 5.  Iliopsoas and quadricep strength appears intact.  Cranial nerve examination is normal.  Station reveals a 10 degree forward  stooped.  Gait is wide-based.  Skin: Skin is warm and dry.  Psychiatric: She has a normal mood and affect. Her behavior is normal. Judgment and thought content normal.     Assessment/Plan Adjacent level disease at L3-4 above previous fusion at L4-5.  Plan: Anterolateral decompression at L3-4 with XLIF technique and lateral plate fixation.  Earleen Newport, MD 11/05/2019, 7:47 AM

## 2019-11-05 NOTE — Anesthesia Postprocedure Evaluation (Signed)
Anesthesia Post Note  Patient: [name]  Procedure(s) Performed: Lumbar Four-Five Anterolateral lumbar interbody fusion with lateral plate (N/A Spine Lumbar)     Patient location during evaluation: PACU Anesthesia Type: General Level of consciousness: awake and alert Pain management: pain level controlled Vital Signs Assessment: post-procedure vital signs reviewed and stable Respiratory status: spontaneous breathing, nonlabored ventilation, respiratory function stable and patient connected to nasal cannula oxygen Cardiovascular status: blood pressure returned to baseline and stable Postop Assessment: no apparent nausea or vomiting Anesthetic complications: no    Last Vitals:  Vitals:   11/05/19 1200 11/05/19 1232  BP: (!) 125/91 (!) 115/59  Pulse: 72 82  Resp: 14 16  Temp:  36.5 C  SpO2: 96% 93%    Last Pain:  Vitals:   11/05/19 1318  TempSrc:   PainSc: [name]

## 2019-11-06 ENCOUNTER — Ambulatory Visit: Payer: Medicare Other

## 2019-11-06 MED ORDER — OXYCODONE-ACETAMINOPHEN 5-325 MG PO TABS: 1.0000 | ORAL_TABLET | ORAL | 0 refills | Status: DC | PRN

## 2019-11-06 MED ORDER — METHOCARBAMOL 500 MG PO TABS: 500.0000 mg | ORAL_TABLET | Freq: Four times a day (QID) | ORAL | 3 refills | Status: DC | PRN

## 2019-11-06 NOTE — Progress Notes (Signed)
Patient discharged home per order. All instructions given per patient and family understanding. All DME's given per therapy order. Patient has an appointment with MD in 3 weeks

## 2019-11-06 NOTE — Evaluation (Signed)
Physical Therapy Evaluation Patient Details Name: [name] MRN: [id number] DOB: 1960/10/20 Today's Date: 11/06/2019   History of Present Illness  Pt 59yo who underwent anterolateral decompression L3-4 adn stabilization. PSH: R knee arthroscopy 10/20, L knee arthroscopy 12/20. PSH: asmtha, anxiety, arthritis, depression. smoker.    Clinical Impression  Patient is s/p above surgery resulting in the deficits listed below (see PT Problem List). Pt requiring use of RW for safe ambulation. Patient will benefit from skilled PT to increase their independence and safety with mobility (while adhering to their precautions) to allow discharge to the venue listed below.     Follow Up Recommendations Home health PT;Supervision/Assistance - 24 hour    Equipment Recommendations  Rolling walker with 5" wheels;3in1 (PT)    Recommendations for Other Services       Precautions / Restrictions Precautions Precautions: Back Precaution Booklet Issued: Yes (comment) Precaution Comments: pt able to recall 2/3 at end of session, pt re-educated on back precautions, spouse also present Required Braces or Orthoses: Spinal Brace Spinal Brace: Lumbar corset;Applied in sitting position Restrictions Weight Bearing Restrictions: No      Mobility  Bed Mobility Overal bed mobility: Needs Assistance Bed Mobility: Rolling;Sidelying to Sit;Sit to Supine Rolling: Min guard Sidelying to sit: Min guard   Sit to supine: Min guard   General bed mobility comments: verbal cues for log roll technique, hob slightly elevated, aware pt has hospital bed at home, pt with good return demonstration  Transfers Overall transfer level: Needs assistance Equipment used: None Transfers: Sit to/from Stand Sit to Stand: Min guard         General transfer comment: verbal cues to scoot to EOB and push up from bed  Ambulation/Gait Ambulation/Gait assistance: Min assist;Supervision Gait Distance (Feet): 300  Feet Assistive device: Rolling walker (2 wheeled);None Gait Pattern/deviations: Step-through pattern;Decreased stride length;Antalgic Gait velocity: dec Gait velocity interpretation: <1.31 ft/sec, indicative of household ambulator General Gait Details: pt began amb without AD however very unsteady and reaching for hand raill or wall. Pt given walker and pt demonstrated more stability, more fluid gait pattern and was able to achieve and maintain upright posture  Stairs Stairs: Yes Stairs assistance: Min assist Stair Management: One rail Left;Step to pattern;Sideways Number of Stairs: 3(to mimic home set up) General stair comments: SOB, pt reports this to be normal, minA to off weight pt pulling self up  Wheelchair Mobility    Modified Rankin (Stroke Patients Only)       Balance Overall balance assessment: Needs assistance Sitting-balance support: No upper extremity supported;Feet supported Sitting balance-Leahy Scale: Good     Standing balance support: Bilateral upper extremity supported Standing balance-Leahy Scale: Poor Standing balance comment: pt requires RW for safe amb                             Pertinent Vitals/Pain Pain Assessment: 0-10 Pain Score: 7  Pain Location: incision/operative site Pain Descriptors / Indicators: Operative site guarding;Sore Pain Intervention(s): Monitored during session;Patient requesting pain meds-RN notified    Home Living Family/patient expects to be discharged to:: Private residence Living Arrangements: Spouse/significant other Available Help at Discharge: Family;Available 24 hours/day Type of Home: House Home Access: Stairs to enter Entrance Stairs-Rails: Left Entrance Stairs-Number of Steps: 3 Home Layout: One level Home Equipment: Hospital bed;Walker - 4 wheels      Prior Function Level of Independence: Independent         Comments: SOB with activity at  baseline     Hand Dominance   Dominant Hand:  Right    Extremity/Trunk Assessment   Upper Extremity Assessment Upper Extremity Assessment: Overall WFL for tasks assessed    Lower Extremity Assessment Lower Extremity Assessment: Generalized weakness    Cervical / Trunk Assessment Cervical / Trunk Assessment: Other exceptions Cervical / Trunk Exceptions: back surgery  Communication   Communication: No difficulties  Cognition Arousal/Alertness: Awake/alert Behavior During Therapy: WFL for tasks assessed/performed Overall Cognitive Status: Within Functional Limits for tasks assessed                                 General Comments: pt requiring verbal cues to recall 3/3 back precautions      General Comments General comments (skin integrity, edema, etc.): incision unobserved, pt able to don brace properly with minimal verbal cues, noted SOB with amb, pt and spouse report this to be normal    Exercises     Assessment/Plan    PT Assessment Patient needs continued PT services  PT Problem List Decreased strength;Decreased range of motion;Decreased activity tolerance;Decreased balance;Decreased mobility;Decreased knowledge of use of DME       PT Treatment Interventions DME instruction;Gait training;Stair training;Functional mobility training;Therapeutic activities;Therapeutic exercise;Balance training;Neuromuscular re-education    PT Goals (Current goals can be found in the Care Plan section)  Acute Rehab PT Goals Patient Stated Goal: home PT Goal Formulation: With patient Time For Goal Achievement: 11/20/19 Potential to Achieve Goals: Good    Frequency Min 5X/week   Barriers to discharge        Co-evaluation               AM-PAC PT "6 Clicks" Mobility  Outcome Measure Help needed turning from your back to your side while in a flat bed without using bedrails?: None Help needed moving from lying on your back to sitting on the side of a flat bed without using bedrails?: None Help needed moving  to and from a bed to a chair (including a wheelchair)?: None Help needed standing up from a chair using your arms (e.g., wheelchair or bedside chair)?: None Help needed to walk in hospital room?: A Little Help needed climbing 3-5 steps with a railing? : A Little 6 Click Score: 22    End of Session Equipment Utilized During Treatment: Gait belt;Back brace Activity Tolerance: Patient tolerated treatment well Patient left: in chair;with call bell/phone within reach;with family/visitor present Nurse Communication: Mobility status PT Visit Diagnosis: Unsteadiness on feet (R26.81);Difficulty in walking, not elsewhere classified (R26.2);Pain Pain - part of body: (backl)    Time: [id number] PT Time Calculation (min) (ACUTE ONLY): 18 min   Charges:   PT Evaluation $PT Eval Moderate Complexity: 1 Mod          Kittie Plater, PT, DPT Acute Rehabilitation Services Pager #: [phone] Office #: [phone]   Berline Lopes 11/06/2019, 8:48 AM

## 2019-11-06 NOTE — TOC Transition Note (Signed)
Transition of Care [facility]) - CM/SW Discharge Note   Patient Details  Name: [name] MRN: [id number] Date of Birth: 04/01/1961  [facility] of [facility]) CM/SW Contact:  Angelita Ingles, RN Phone Number: 11/06/2019, 10:07 AM   Clinical Narrative: Patient is being discharged with [facility] PT/OT orders [facility] has been notified and will provide [facility] services. No further needs noted at this time.       Final next level of care: [place] Barriers to Discharge: No Barriers Identified   Patient Goals and CMS Choice Patient states their goals for this hospitalization and ongoing recovery are:: To go home CMS Medicare.gov Compare Post Acute Care list provided to:: Patient Choice offered to / list presented to : Patient  Discharge Placement                       Discharge Plan and Services                DME Arranged: N/A DME Agency: NA       HH Arranged: PT, OT [place] Agency: [facility] (now [facility]) Date [place]: 11/06/19 Time [place]: 1007 Representative spoke with at [place]: [place] ([place]) Interventions     Readmission Risk Interventions No flowsheet data found.

## 2019-11-06 NOTE — Discharge Instructions (Signed)
Wound Care Remove dressing in 2 days Leave incision open to air. You may shower. Do not scrub directly on incision.  Do not put any creams, lotions, or ointments on incision. Activity Walk each and every day, increasing distance each day. No lifting greater than 5 lbs.  Avoid bending, arching, and twisting. No driving for 2 weeks; may ride as a passenger locally. If provided with back brace, wear when out of bed.  It is not necessary to wear in bed. Diet Resume your normal diet.  Return to Work Will be discussed at you follow up appointment. Call Your Doctor If Any of These Occur Redness, drainage, or swelling at the wound.  Temperature greater than 101 degrees. Severe pain not relieved by pain medication. Incision starts to come apart. Follow Up Appt Call today for appointment in 2-3 weeks [id number]) or for problems.  If you have any hardware placed in your spine, you will need an x-ray before your appointment.

## 2019-11-06 NOTE — Evaluation (Signed)
Occupational Therapy Evaluation Patient Details Name: [name] MRN: [id number] DOB: [birth year] Today's Date: 11/06/2019    History of Present Illness Pt 59yo who underwent anterolateral decompression L3-4 adn stabilization. PSH: R knee arthroscopy 10/20, L knee arthroscopy 12/20. PSH: asmtha, anxiety, arthritis, depression. smoker.   Clinical Impression   This 59 y/o female presents with the above. PTA pt reports independence with ADL and functional mobility. Pt demonstrating functional transfers and seated UB ADL at minguard assist level today, requiring at least minA for LB ADL due to difficulty accessing LEs while maintaining back precautions. Pt requiring up to mod cues for carryover of precautions during functional tasks. Spouse present and supportive during session and reports able to assist with ADL PRN after discharge home. Educated and further reviewed with both pt/pt's spouse re: back precautions, brace management, safety and compensatory techniques for completing ADL and mobility tasks after return home. Pt will benefit from continued acute OT services and recommend follow up [place] services to maximize her safety and independence with ADL and mobility after return home.     Follow Up Recommendations  Home health OT;Supervision/Assistance - 24 hour    Equipment Recommendations  3 in 1 bedside commode           Precautions / Restrictions Precautions Precautions: Back Precaution Booklet Issued: Yes (comment) Precaution Comments: pt able to recall 3/3 back precautions Required Braces or Orthoses: Spinal Brace Spinal Brace: Lumbar corset;Applied in sitting position Restrictions Weight Bearing Restrictions: No      Mobility Bed Mobility               General bed mobility comments: received OOB in recliner   Transfers Overall transfer level: Needs assistance Equipment used: None Transfers: Sit to/from Stand Sit to Stand: Min guard         General  transfer comment: for balance and safety    Balance Overall balance assessment: Needs assistance Sitting-balance support: No upper extremity supported;Feet supported Sitting balance-Leahy Scale: Good     Standing balance support: Bilateral upper extremity supported Standing balance-Leahy Scale: Poor Standing balance comment: pt requires RW for safe amb                           ADL either performed or assessed with clinical judgement   ADL Overall ADL's : Needs assistance/impaired Eating/Feeding: Modified independent;Sitting   Grooming: Min guard;Standing   Upper Body Bathing: Supervision/ safety;Sitting   Lower Body Bathing: Minimal assistance;Sit to/from stand   Upper Body Dressing : Set up;Supervision/safety;Sitting Upper Body Dressing Details (indicate cue type and reason): donning overhead shirt and bra, donning lumbar corsett, cues to apply in sitting  Lower Body Dressing: Minimal assistance;Sit to/from stand Lower Body Dressing Details (indicate cue type and reason): mod cues for adhering to back precautions and not to bend towards feet, discussed likely need to have spouse assist and pt/pt's spouse in agreement  Toilet Transfer: Min guard;Ambulation;RW   Toileting- Clothing Manipulation and Hygiene: Min guard;Minimal assistance;Sit to/from stand       Functional mobility during ADLs: Min guard                           Pertinent Vitals/Pain Pain Assessment: Faces Faces Pain Scale: Hurts little more Pain Location: incision/operative site Pain Descriptors / Indicators: Operative site guarding;Sore Pain Intervention(s): Limited activity within patient's tolerance;Monitored during session;Repositioned     Hand Dominance Right   Extremity/Trunk Assessment  Upper Extremity Assessment Upper Extremity Assessment: Overall WFL for tasks assessed   Lower Extremity Assessment Lower Extremity Assessment: Defer to PT evaluation   Cervical / Trunk  Assessment Cervical / Trunk Assessment: Other exceptions Cervical / Trunk Exceptions: back surgery   Communication Communication Communication: No difficulties   Cognition Arousal/Alertness: Awake/alert Behavior During Therapy: WFL for tasks assessed/performed Overall Cognitive Status: Within Functional Limits for tasks assessed                                 General Comments:     General Comments       Exercises     Shoulder Instructions      Home Living Family/patient expects to be discharged to:: Private residence Living Arrangements: Spouse/significant other Available Help at Discharge: Family;Available 24 hours/day Type of Home: House Home Access: Stairs to enter [employer] of Steps: 3 Entrance Stairs-Rails: Left Home Layout: One level     Bathroom Shower/Tub: Occupational psychologist: Standard     Home Equipment: Hospital bed;Walker - 4 wheels          Prior Functioning/Environment Level of Independence: Independent        Comments: SOB with activity at baseline        OT Problem List: Decreased strength;Decreased activity tolerance;Impaired balance (sitting and/or standing);Decreased knowledge of use of DME or AE;Decreased knowledge of precautions;Pain      OT Treatment/Interventions: Self-care/ADL training;Therapeutic exercise;DME and/or AE instruction;Therapeutic activities;Cognitive remediation/compensation;Balance training;Patient/family education    OT Goals(Current goals can be found in the care plan section) Acute Rehab OT Goals Patient Stated Goal: home OT Goal Formulation: With patient Time For Goal Achievement: 11/20/19 Potential to Achieve Goals: Good  OT Frequency: Min 2X/week   Barriers to D/C:            Co-evaluation              AM-PAC OT "6 Clicks" Daily Activity     Outcome Measure Help from another person eating meals?: None Help from another person taking care of personal  grooming?: A Little Help from another person toileting, which includes using toliet, bedpan, or urinal?: A Little Help from another person bathing (including washing, rinsing, drying)?: A Little Help from another person to put on and taking off regular upper body clothing?: A Little Help from another person to put on and taking off regular lower body clothing?: A Little 6 Click Score: 19   End of Session Equipment Utilized During Treatment: Back brace Nurse Communication: Mobility status  Activity Tolerance: Patient tolerated treatment well Patient left: in chair;with call bell/phone within reach;with family/visitor present  OT Visit Diagnosis: Other abnormalities of gait and mobility (R26.89);Pain Pain - part of body: (back)                Time: [id number] OT Time Calculation (min): 17 min Charges:  OT General Charges $OT Visit: 1 Visit OT Evaluation $OT Eval Low Complexity: [place], OT Acute Rehabilitation Services Pager [phone] Office [phone]  Raymondo Band 11/06/2019, 1:51 PM

## 2019-11-06 NOTE — Plan of Care (Signed)
Patient is ready to be discharged after PT/OT evaluates this morning.

## 2019-11-06 NOTE — Discharge Summary (Signed)
Physician Discharge Summary  Patient ID: [name] MRN: [id number] DOB/AGE: 03-16-1961 59 y.o.  Admit date: 11/05/2019 Discharge date: 11/06/2019  Admission Diagnoses: Lumbar radiculopathy lumbar stenosis L3-4 status post decompression fusion L4-5, transitional anatomy  Discharge Diagnoses: Lumbar radiculopathy lumbar stenosis L3-4 status post decompression and fusion L4-5, transitional anatomy Active Problems:   Lumbar stenosis   Discharged Condition: fair  Hospital Course: Patient was admitted to undergo surgical decompression which he tolerated well incisions are clean and dry  Consults: None  Significant Diagnostic Studies: None  Treatments: surgery: Anterolateral decompression L3-4 lateral plate fixation  Discharge Exam: Blood pressure (!) 98/48, pulse 67, temperature 98 F (36.7 C), temperature source Oral, resp. rate 16, height 5\' 4"  (1.626 m), weight 109.6 kg, SpO2 96 %. Incision is clean and dry modest weakness in the iliopsoas and the quads bilaterally 4 out of 5.  Disposition: Discharge disposition: 01-Home or Self Care       Discharge Instructions    Call MD for:  redness, tenderness, or signs of infection (pain, swelling, redness, odor or green/yellow discharge around incision site)   Complete by: As directed    Call MD for:  severe uncontrolled pain   Complete by: As directed    Call MD for:  temperature >100.4   Complete by: As directed    Diet - low sodium heart healthy   Complete by: As directed    Discharge instructions   Complete by: As directed    Okay to shower. Do not apply salves or appointments to incision. No heavy lifting with the upper extremities greater than 15 pounds. May resume driving when not requiring pain medication and patient feels comfortable with doing so.   Incentive spirometry RT   Complete by: As directed    Increase activity slowly   Complete by: As directed      Allergies as of 11/06/2019   No Known Allergies      Medication List    TAKE these medications   amphetamine-dextroamphetamine 20 MG tablet Commonly known as: ADDERALL Take 20 mg by mouth daily.   busPIRone 10 MG tablet Commonly known as: BUSPAR Take 5 mg by mouth 3 (three) times daily.   dicyclomine 10 MG capsule Commonly known as: BENTYL TAKE  (1)  CAPSULE THREE TIMES DAILY BEFORE MEALS.  (TAKE AT LEAST 30MIN-   BEFORE MEALS)   estradiol 2 MG tablet Commonly known as: ESTRACE TAKE ONE TABLET AT BEDTIME   FLUoxetine 40 MG capsule Commonly known as: PROZAC Take 40 mg by mouth daily.   gabapentin 300 MG capsule Commonly known as: NEURONTIN Take 1 capsule (300 mg total) by mouth 3 (three) times daily. What changed:   when to take this  reasons to take this   GOODY HEADACHE PO Take 1 packet by mouth daily as needed (headaches).   loratadine 10 MG tablet Commonly known as: CLARITIN Take 10 mg by mouth daily.   methocarbamol 500 MG tablet Commonly known as: ROBAXIN Take 1 tablet (500 mg total) by mouth every 6 (six) hours as needed for muscle spasms.   naproxen 500 MG tablet Commonly known as: NAPROSYN Take 500 mg by mouth 2 (two) times daily with a meal.   oxyCODONE 5 MG immediate release tablet Commonly known as: Oxy IR/ROXICODONE Take 1 tablet (5 mg total) by mouth every 4 (four) hours as needed for severe pain.   oxyCODONE-acetaminophen 5-325 MG tablet Commonly known as: PERCOCET/ROXICET Take 1-2 tablets by mouth every 3 (three) hours as  needed for moderate pain or severe pain.   pravastatin 40 MG tablet Commonly known as: PRAVACHOL Take 40 mg by mouth at bedtime.   predniSONE 10 MG (48) Tbpk tablet Commonly known as: STERAPRED UNI-PAK 48 TAB Take by mouth daily.   tiZANidine 4 MG tablet Commonly known as: Zanaflex Take 1 tablet (4 mg total) by mouth 3 (three) times daily. What changed:   when to take this  reasons to take this   traZODone 100 MG tablet Commonly known as: DESYREL Take 200 mg  by mouth at bedtime.   Vitamin D3 50 MCG (2000 UT) capsule Take 2,000 Units by mouth daily.            Durable Medical Equipment  (From admission, onward)         Start     Ordered   11/06/19 0849  For home use only DME 3 n 1  Once     11/06/19 0850   11/06/19 0849  For home use only DME Walker  Once    Question:  Patient needs a walker to treat with the following condition  Answer:  S/P lumbar spinal fusion   11/06/19 0850           Signed: Blanchie Dessert Graylyn Bunney 11/06/2019, 9:00 AM

## 2019-11-12 ENCOUNTER — Ambulatory Visit (HOSPITAL_COMMUNITY)
Admission: RE | Admit: 2019-11-12 | Discharge: 2019-11-12 | Disposition: A | Discharge status: Home / Self Care | Payer: Medicare Other | Source: Ambulatory Visit | Attending: Neurological Surgery | Admitting: Neurological Surgery

## 2019-11-12 ENCOUNTER — Other Ambulatory Visit (HOSPITAL_COMMUNITY): Payer: Self-pay | Admitting: Neurological Surgery

## 2019-11-12 ENCOUNTER — Other Ambulatory Visit: Payer: Self-pay

## 2019-11-12 DIAGNOSIS — R609 Edema, unspecified: Secondary | ICD-10-CM

## 2019-11-12 DIAGNOSIS — R2243 Localized swelling, mass and lump, lower limb, bilateral: Secondary | ICD-10-CM | POA: Insufficient documentation

## 2019-11-12 NOTE — Progress Notes (Signed)
Bilateral lower extremity venous duplex complete.  Please see CV proc tab for preliminary results.  Preliminary results given to Dr. Clarice Pole medical assistant @ 16:30- patient okay to leave. Lita Mains- RDMS, RVT 4:40 PM  11/12/2019

## 2019-11-22 DIAGNOSIS — M4316 Spondylolisthesis, lumbar region: Secondary | ICD-10-CM | POA: Insufficient documentation

## 2020-01-04 ENCOUNTER — Other Ambulatory Visit: Payer: Self-pay | Admitting: Obstetrics & Gynecology

## 2020-04-01 DIAGNOSIS — G5600 Carpal tunnel syndrome, unspecified upper limb: Secondary | ICD-10-CM | POA: Insufficient documentation

## 2020-05-06 DIAGNOSIS — Z6841 Body Mass Index (BMI) 40.0 and over, adult: Secondary | ICD-10-CM | POA: Insufficient documentation

## 2020-05-28 ENCOUNTER — Telehealth: Payer: Self-pay | Admitting: Orthopedic Surgery

## 2020-05-28 NOTE — Telephone Encounter (Signed)
Call received from patient relaying her recurring problems with both knees. States she has had back surgery done "both", yet the knees are continuing to hurt and that she has a hard time walking some days.  I have scheduled her for first available in mid-December, and also placed her on our wait list. If any other advice, please let patient know.

## 2020-05-29 NOTE — Telephone Encounter (Signed)
Thank you, no

## 2020-06-02 NOTE — Telephone Encounter (Signed)
Called back to patient; voiced understanding

## 2020-06-24 DIAGNOSIS — I10 Essential (primary) hypertension: Secondary | ICD-10-CM

## 2020-06-24 HISTORY — DX: Essential (primary) hypertension: I10

## 2020-06-25 ENCOUNTER — Encounter: Payer: Self-pay | Admitting: Orthopedic Surgery

## 2020-06-25 ENCOUNTER — Ambulatory Visit: Payer: Medicare Other

## 2020-06-25 ENCOUNTER — Other Ambulatory Visit: Payer: Self-pay

## 2020-06-25 ENCOUNTER — Other Ambulatory Visit: Payer: Self-pay | Admitting: Neurological Surgery

## 2020-06-25 ENCOUNTER — Ambulatory Visit (INDEPENDENT_AMBULATORY_CARE_PROVIDER_SITE_OTHER): Payer: Medicare Other | Admitting: Orthopedic Surgery

## 2020-06-25 ENCOUNTER — Other Ambulatory Visit (HOSPITAL_COMMUNITY): Payer: Self-pay | Admitting: Neurological Surgery

## 2020-06-25 VITALS — Ht 64.0 in | Wt 256.0 lb

## 2020-06-25 DIAGNOSIS — M5410 Radiculopathy, site unspecified: Secondary | ICD-10-CM

## 2020-06-25 DIAGNOSIS — Z9889 Other specified postprocedural states: Secondary | ICD-10-CM | POA: Diagnosis not present

## 2020-06-25 DIAGNOSIS — G8929 Other chronic pain: Secondary | ICD-10-CM

## 2020-06-25 DIAGNOSIS — M25562 Pain in left knee: Secondary | ICD-10-CM

## 2020-06-25 DIAGNOSIS — M25561 Pain in right knee: Secondary | ICD-10-CM

## 2020-06-25 DIAGNOSIS — M4316 Spondylolisthesis, lumbar region: Secondary | ICD-10-CM

## 2020-06-25 NOTE — Progress Notes (Signed)
Chief Complaint  Patient presents with  . Knee Pain    Bilateral left is worse    Encounter Diagnoses  Name Primary?  . Radicular pain of left lower extremity Yes  . S/P left knee arthroscopy 06/27/2019   . S/P right knee arthroscopy 05/09/19   . Chronic pain of left knee   . Chronic pain of right knee    59 year old female history of lumbar fusion status post right and left knee arthroscopies in 2020, developed pain in the left leg and after a course of steroids and rest and no improvement she was sent for MRI which showed multilevel disc disease spinal stenosis at L4 and 5 with compression of L4 nerve root she was sent for neurosurgical referral presents today complaining of swelling and pain   Ht 5\' 4"  (1.626 m)   Wt 256 lb (116.1 kg)   BMI 43.94 kg/m   General appearance patient is overweight otherwise normal.  Mood affect normal  Oriented x3 normal  Gait sluggish  Right knee 0-120 left knee 5-110 Tenderness medial compartment right and left knee Muscle tone and strength normal right and left knee Right and left knee stable  Encounter Diagnoses  Name Primary?  . Radicular pain of left lower extremity Yes  . S/P left knee arthroscopy 06/27/2019   . S/P right knee arthroscopy 05/09/19   . Chronic pain of left knee   . Chronic pain of right knee     The patient meets the AMA guidelines for Morbid (severe) obesity with a BMI > 40.0 and I have recommended weight loss.  Assessment and plan bilateral knee pain left greater than right knee arthritis in the setting of chronic posterior leg pain currently being worked up by neurosurgery  Patient also needs to lose weight we gave her 2 injections she will follow up on an as-needed basis.  The left knee is deteriorating   Procedure note for bilateral knee injections  Procedure note left knee injection verbal consent was obtained to inject left knee joint  Timeout was completed to confirm the site of injection  The  medications used were 40 mg of Depo-Medrol and 1% lidocaine 3 cc  Anesthesia was provided by ethyl chloride and the skin was prepped with alcohol.  After cleaning the skin with alcohol a 20-gauge needle was used to inject the left knee joint. There were no complications. A sterile bandage was applied.   Procedure note right knee injection verbal consent was obtained to inject right knee joint  Timeout was completed to confirm the site of injection  The medications used were 40 mg of Depo-Medrol and 1% lidocaine 3 cc  Anesthesia was provided by ethyl chloride and the skin was prepped with alcohol.  After cleaning the skin with alcohol a 20-gauge needle was used to inject the right knee joint. There were no complications. A sterile bandage was applied.

## 2020-06-25 NOTE — Patient Instructions (Addendum)
You have received an injection of steroids into the joint. 15% of patients will have increased pain within the 24 hours postinjection.   This is transient and will go away.   We recommend that you use ice packs on the injection site for 20 minutes every 2 hours and extra strength Tylenol 2 tablets every 8 as needed until the pain resolves.  If you continue to have pain after taking the Tylenol and using the ice please call the office for further instructions.   Calorie Counting for Weight Loss Calories are units of energy. Your body needs a certain amount of calories from food to keep you going throughout the day. When you eat more calories than your body needs, your body stores the extra calories as fat. When you eat fewer calories than your body needs, your body burns fat to get the energy it needs. Calorie counting means keeping track of how many calories you eat and drink each day. Calorie counting can be helpful if you need to lose weight. If you make sure to eat fewer calories than your body needs, you should lose weight. Ask your health care provider what a healthy weight is for you. For calorie counting to work, you will need to eat the right number of calories in a day in order to lose a healthy amount of weight per week. A dietitian can help you determine how many calories you need in a day and will give you suggestions on how to reach your calorie goal.  A healthy amount of weight to lose per week is usually 1-2 lb (0.5-0.9 kg). This usually means that your daily calorie intake should be reduced by 500-750 calories.  Eating 1,200 - 1,500 calories per day can help most women lose weight.  Eating 1,500 - 1,800 calories per day can help most men lose weight. What is my plan? My goal is to have __________ calories per day. If I have this many calories per day, I should lose around __________ pounds per week. What do I need to know about calorie counting? In order to meet your daily  calorie goal, you will need to:  Find out how many calories are in each food you would like to eat. Try to do this before you eat.  Decide how much of the food you plan to eat.  Write down what you ate and how many calories it had. Doing this is called keeping a food log. To successfully lose weight, it is important to balance calorie counting with a healthy lifestyle that includes regular activity. Aim for 150 minutes of moderate exercise (such as walking) or 75 minutes of vigorous exercise (such as running) each week. Where do I find calorie information?  The number of calories in a food can be found on a Nutrition Facts label. If a food does not have a Nutrition Facts label, try to look up the calories online or ask your dietitian for help. Remember that calories are listed per serving. If you choose to have more than one serving of a food, you will have to multiply the calories per serving by the amount of servings you plan to eat. For example, the label on a package of bread might say that a serving size is 1 slice and that there are 90 calories in a serving. If you eat 1 slice, you will have eaten 90 calories. If you eat 2 slices, you will have eaten 180 calories. How do I keep a food log?  Immediately after each meal, record the following information in your food log:  What you ate. Don't forget to include toppings, sauces, and other extras on the food.  How much you ate. This can be measured in cups, ounces, or number of items.  How many calories each food and drink had.  The total number of calories in the meal. Keep your food log near you, such as in a small notebook in your pocket, or use a mobile app or website. Some programs will calculate calories for you and show you how many calories you have left for the day to meet your goal. What are some calorie counting tips?   Use your calories on foods and drinks that will fill you up and not leave you hungry: ? Some examples of foods  that fill you up are nuts and nut butters, vegetables, lean proteins, and high-fiber foods like whole grains. High-fiber foods are foods with more than 5 g fiber per serving. ? Drinks such as sodas, specialty coffee drinks, alcohol, and juices have a lot of calories, yet do not fill you up.  Eat nutritious foods and avoid empty calories. Empty calories are calories you get from foods or beverages that do not have many vitamins or protein, such as candy, sweets, and soda. It is better to have a nutritious high-calorie food (such as an avocado) than a food with few nutrients (such as a bag of chips).  Know how many calories are in the foods you eat most often. This will help you calculate calorie counts faster.  Pay attention to calories in drinks. Low-calorie drinks include water and unsweetened drinks.  Pay attention to nutrition labels for "low fat" or "fat free" foods. These foods sometimes have the same amount of calories or more calories than the full fat versions. They also often have added sugar, starch, or salt, to make up for flavor that was removed with the fat.  Find a way of tracking calories that works for you. Get creative. Try different apps or programs if writing down calories does not work for you. What are some portion control tips?  Know how many calories are in a serving. This will help you know how many servings of a certain food you can have.  Use a measuring cup to measure serving sizes. You could also try weighing out portions on a kitchen scale. With time, you will be able to estimate serving sizes for some foods.  Take some time to put servings of different foods on your favorite plates, bowls, and cups so you know what a serving looks like.  Try not to eat straight from a bag or box. Doing this can lead to overeating. Put the amount you would like to eat in a cup or on a plate to make sure you are eating the right portion.  Use smaller plates, glasses, and bowls to  prevent overeating.  Try not to multitask (for example, watch TV or use your computer) while eating. If it is time to eat, sit down at a table and enjoy your food. This will help you to know when you are full. It will also help you to be aware of what you are eating and how much you are eating. What are tips for following this plan? Reading food labels  Check the calorie count compared to the serving size. The serving size may be smaller than what you are used to eating.  Check the source of the calories. Make sure  the food you are eating is high in vitamins and protein and low in saturated and trans fats. Shopping  Read nutrition labels while you shop. This will help you make healthy decisions before you decide to purchase your food.  Make a grocery list and stick to it. Cooking  Try to cook your favorite foods in a healthier way. For example, try baking instead of frying.  Use low-fat dairy products. Meal planning  Use more fruits and vegetables. Half of your plate should be fruits and vegetables.  Include lean proteins like poultry and fish. How do I count calories when eating out?  Ask for smaller portion sizes.  Consider sharing an entree and sides instead of getting your own entree.  If you get your own entree, eat only half. Ask for a box at the beginning of your meal and put the rest of your entree in it so you are not tempted to eat it.  If calories are listed on the menu, choose the lower calorie options.  Choose dishes that include vegetables, fruits, whole grains, low-fat dairy products, and lean protein.  Choose items that are boiled, broiled, grilled, or steamed. Stay away from items that are buttered, battered, fried, or served with cream sauce. Items labeled "crispy" are usually fried, unless stated otherwise.  Choose water, low-fat milk, unsweetened iced tea, or other drinks without added sugar. If you want an alcoholic beverage, choose a lower calorie option  such as a glass of wine or light beer.  Ask for dressings, sauces, and syrups on the side. These are usually high in calories, so you should limit the amount you eat.  If you want a salad, choose a garden salad and ask for grilled meats. Avoid extra toppings like bacon, cheese, or fried items. Ask for the dressing on the side, or ask for olive oil and vinegar or lemon to use as dressing.  Estimate how many servings of a food you are given. For example, a serving of cooked rice is  cup or about the size of half a baseball. Knowing serving sizes will help you be aware of how much food you are eating at restaurants. The list below tells you how big or small some common portion sizes are based on everyday objects: ? 1 oz--4 stacked dice. ? 3 oz--1 deck of cards. ? 1 tsp--1 die. ? 1 Tbsp-- a ping-pong ball. ? 2 Tbsp--1 ping-pong ball. ?  cup-- baseball. ? 1 cup--1 baseball. Summary  Calorie counting means keeping track of how many calories you eat and drink each day. If you eat fewer calories than your body needs, you should lose weight.  A healthy amount of weight to lose per week is usually 1-2 lb (0.5-0.9 kg). This usually means reducing your daily calorie intake by 500-750 calories.  The number of calories in a food can be found on a Nutrition Facts label. If a food does not have a Nutrition Facts label, try to look up the calories online or ask your dietitian for help.  Use your calories on foods and drinks that will fill you up, and not on foods and drinks that will leave you hungry.  Use smaller plates, glasses, and bowls to prevent overeating. This information is not intended to replace advice given to you by your health care provider. Make sure you discuss any questions you have with your health care provider. Document Revised: 03/16/2018 Document Reviewed: 05/27/2016 Elsevier Patient Education  [employer].

## 2020-07-01 ENCOUNTER — Ambulatory Visit (HOSPITAL_COMMUNITY)
Admission: RE | Admit: 2020-07-01 | Discharge: 2020-07-01 | Disposition: A | Discharge status: Home / Self Care | Payer: Medicare Other | Source: Ambulatory Visit | Attending: Neurological Surgery | Admitting: Neurological Surgery

## 2020-07-01 ENCOUNTER — Other Ambulatory Visit: Payer: Self-pay

## 2020-07-01 DIAGNOSIS — M4316 Spondylolisthesis, lumbar region: Secondary | ICD-10-CM | POA: Diagnosis present

## 2020-07-14 ENCOUNTER — Other Ambulatory Visit: Payer: Self-pay | Admitting: Neurological Surgery

## 2020-07-31 ENCOUNTER — Other Ambulatory Visit (HOSPITAL_COMMUNITY): Payer: Medicare Other

## 2020-07-31 ENCOUNTER — Inpatient Hospital Stay (HOSPITAL_COMMUNITY): Admission: RE | Admit: 2020-07-31 | Payer: Medicare Other | Source: Ambulatory Visit

## 2020-08-20 NOTE — Progress Notes (Signed)
Surgical Instructions    Your procedure is scheduled on Tuesday, August 25, 2020 from 11:00AM - 2:51PM .  Report to [facility] Main Entrance "A" at 9:00 A.M., then check in with the Admitting office.  Call this number if you have problems the morning of surgery:  [phone]   If you have any questions prior to your surgery date call [phone]: Open Monday-Friday 8am-4pm    Remember:  Do not eat or drink after midnight the night before your surgery    Take these medicines the morning of surgery with A SIP OF WATER:  ARIPiprazole (ABILIFY) FLUoxetine (PROZAC) gabapentin (NEURONTIN)  loratadine (CLARITIN)   IF NEEDED: acetaminophen (TYLENOL)  EPINEPHrine (PRIMATENE MIST IN) methocarbamol (ROBAXIN) oxyCODONE (OXY IR/ROXICODONE)  As of today, STOP taking any Aspirin (unless otherwise instructed by your surgeon) ALEVE, NAPROXEN, Ibuprofen, Motrin, Advil, Goody's, BC's, all herbal medications, fish oil, and all vitamins.                     Do not wear jewelry, make up, or nail polish            Do not wear lotions, powders, perfumes, or deodorant.            Do not shave 48 hours prior to surgery.            Do not bring valuables to the hospital.            [facility] is not responsible for any belongings or valuables.  Do NOT Smoke (Tobacco/Vaping) or drink Alcohol 24 hours prior to your procedure If you use a CPAP at night, you may bring all equipment for your overnight stay.   Contacts, glasses, dentures or bridgework may not be worn into surgery, please bring cases for these belongings   For patients admitted to the hospital, discharge time will be determined by your treatment team.   Patients discharged the day of surgery will not be allowed to drive home, and someone needs to stay with them for 24 hours.    Special instructions:   [place]- Preparing For Surgery  Before surgery, you can play an important role. Because skin is not sterile, your skin needs  to be as free of germs as possible. You can reduce the number of germs on your skin by washing with CHG (chlorahexidine gluconate) Soap before surgery.  CHG is an antiseptic cleaner which kills germs and bonds with the skin to continue killing germs even after washing.    Oral Hygiene is also important to reduce your risk of infection.  Remember - BRUSH YOUR TEETH THE MORNING OF SURGERY WITH YOUR REGULAR TOOTHPASTE  Please do not use if you have an allergy to CHG or antibacterial soaps. If your skin becomes reddened/irritated stop using the CHG.  Do not shave (including legs and underarms) for at least 48 hours prior to first CHG shower. It is OK to shave your face.  Please follow these instructions carefully.   1. Shower the NIGHT BEFORE SURGERY and the MORNING OF SURGERY  2. If you chose to wash your hair, wash your hair first as usual with your normal shampoo.  3. After you shampoo, rinse your hair and body thoroughly to remove the shampoo.  4. Wash Face and genitals (private parts) with your normal soap.   5.  Shower the NIGHT BEFORE SURGERY and the MORNING OF SURGERY with CHG Soap.   6. Use CHG Soap as you would any other  liquid soap. You can apply CHG directly to the skin and wash gently with a scrungie or a clean washcloth.   7. Apply the CHG Soap to your body ONLY FROM THE NECK DOWN.  Do not use on open wounds or open sores. Avoid contact with your eyes, ears, mouth and genitals (private parts). Wash Face and genitals (private parts)  with your normal soap.   8. Wash thoroughly, paying special attention to the area where your surgery will be performed.  9. Thoroughly rinse your body with warm water from the neck down.  10. DO NOT shower/wash with your normal soap after using and rinsing off the CHG Soap.  11. Pat yourself dry with a CLEAN TOWEL.  12. Wear CLEAN PAJAMAS to bed the night before surgery  13. Place CLEAN SHEETS on your bed the night before your surgery  14. DO  NOT SLEEP WITH PETS.   Day of Surgery: Wear Clean/Comfortable clothing the morning of surgery Do not apply any deodorants/lotions.   Remember to brush your teeth WITH YOUR REGULAR TOOTHPASTE.   Please read over the following fact sheets that you were given.

## 2020-08-21 ENCOUNTER — Other Ambulatory Visit (HOSPITAL_COMMUNITY)
Admission: RE | Admit: 2020-08-21 | Discharge: 2020-08-21 | Disposition: A | Discharge status: Home / Self Care | Payer: Medicare Other | Source: Ambulatory Visit | Attending: Neurological Surgery | Admitting: Neurological Surgery

## 2020-08-21 ENCOUNTER — Other Ambulatory Visit: Payer: Self-pay

## 2020-08-21 ENCOUNTER — Encounter (HOSPITAL_COMMUNITY)
Admission: RE | Admit: 2020-08-21 | Discharge: 2020-08-21 | Disposition: A | Discharge status: Home / Self Care | Payer: Medicare Other | Source: Ambulatory Visit | Attending: Neurological Surgery | Admitting: Neurological Surgery

## 2020-08-21 ENCOUNTER — Encounter (HOSPITAL_COMMUNITY): Payer: Self-pay

## 2020-08-21 DIAGNOSIS — Z20822 Contact with and (suspected) exposure to covid-19: Secondary | ICD-10-CM | POA: Insufficient documentation

## 2020-08-21 DIAGNOSIS — Z01812 Encounter for preprocedural laboratory examination: Secondary | ICD-10-CM | POA: Insufficient documentation

## 2020-08-21 HISTORY — DX: Malignant (primary) neoplasm, unspecified: C80.1

## 2020-08-21 LAB — CBC
HCT: 40.5 % (ref 36.0–46.0)
Hemoglobin: 13 g/dL (ref 12.0–15.0)
MCH: 29.6 pg (ref 26.0–34.0)
MCHC: 32.1 g/dL (ref 30.0–36.0)
MCV: 92.3 fL (ref 80.0–100.0)
Platelets: 288 10*3/uL (ref 150–400)
RBC: 4.39 MIL/uL (ref 3.87–5.11)
RDW: 13.2 % (ref 11.5–15.5)
WBC: 10.1 10*3/uL (ref 4.0–10.5)
nRBC: 0 % (ref 0.0–0.2)

## 2020-08-21 LAB — TYPE AND SCREEN
ABO/RH(D): O POS
Antibody Screen: NEGATIVE

## 2020-08-21 LAB — SURGICAL PCR SCREEN
MRSA, PCR: NEGATIVE
Staphylococcus aureus: NEGATIVE

## 2020-08-21 LAB — SARS CORONAVIRUS 2 (TAT 6-24 HRS): SARS Coronavirus 2: NEGATIVE

## 2020-08-21 NOTE — Progress Notes (Signed)
PCP - Dr. Lucia Gaskins Cardiologist - Denies  PPM/ICD - Denies  Chest x-ray - N/A EKG - N/A Stress Test - Denies ECHO - Denies Cardiac Cath - Denies  Sleep Study - Denies  Patient denies having diabetes.  Blood Thinner Instructions: N/A Aspirin Instructions: N/A  ERAS Protcol - No  COVID TEST- 08/21/20   Anesthesia review: No  Patient denies shortness of breath, fever, cough and chest pain at PAT appointment   All instructions explained to the patient, with a verbal understanding of the material. Patient agrees to go over the instructions while at home for a better understanding. Patient also instructed to self quarantine after being tested for COVID-19. The opportunity to ask questions was provided.

## 2020-08-25 ENCOUNTER — Inpatient Hospital Stay (HOSPITAL_COMMUNITY)
Admission: RE | Admit: 2020-08-25 | Discharge: 2020-08-26 | DRG: 460 | Disposition: A | Discharge status: Home / Self Care | Payer: Medicare Other | Attending: Neurological Surgery | Admitting: Neurological Surgery

## 2020-08-25 ENCOUNTER — Inpatient Hospital Stay (HOSPITAL_COMMUNITY): Payer: Medicare Other | Admitting: Anesthesiology

## 2020-08-25 ENCOUNTER — Inpatient Hospital Stay (HOSPITAL_COMMUNITY): Payer: Medicare Other

## 2020-08-25 ENCOUNTER — Other Ambulatory Visit: Payer: Self-pay

## 2020-08-25 ENCOUNTER — Encounter (HOSPITAL_COMMUNITY)
Admission: RE | Disposition: A | Discharge status: Home / Self Care | Payer: Self-pay | Source: Home / Self Care | Attending: Neurological Surgery

## 2020-08-25 ENCOUNTER — Inpatient Hospital Stay (HOSPITAL_COMMUNITY): Payer: Medicare Other | Admitting: Vascular Surgery

## 2020-08-25 ENCOUNTER — Encounter (HOSPITAL_COMMUNITY): Payer: Self-pay | Admitting: Neurological Surgery

## 2020-08-25 DIAGNOSIS — M4726 Other spondylosis with radiculopathy, lumbar region: Secondary | ICD-10-CM | POA: Diagnosis present

## 2020-08-25 DIAGNOSIS — Z87891 Personal history of nicotine dependence: Secondary | ICD-10-CM

## 2020-08-25 DIAGNOSIS — Z7984 Long term (current) use of oral hypoglycemic drugs: Secondary | ICD-10-CM

## 2020-08-25 DIAGNOSIS — Z8541 Personal history of malignant neoplasm of cervix uteri: Secondary | ICD-10-CM

## 2020-08-25 DIAGNOSIS — E785 Hyperlipidemia, unspecified: Secondary | ICD-10-CM | POA: Diagnosis present

## 2020-08-25 DIAGNOSIS — S32009K Unspecified fracture of unspecified lumbar vertebra, subsequent encounter for fracture with nonunion: Secondary | ICD-10-CM | POA: Diagnosis present

## 2020-08-25 DIAGNOSIS — M960 Pseudarthrosis after fusion or arthrodesis: Principal | ICD-10-CM | POA: Diagnosis present

## 2020-08-25 DIAGNOSIS — Z79818 Long term (current) use of other agents affecting estrogen receptors and estrogen levels: Secondary | ICD-10-CM

## 2020-08-25 DIAGNOSIS — Z419 Encounter for procedure for purposes other than remedying health state, unspecified: Secondary | ICD-10-CM

## 2020-08-25 DIAGNOSIS — E119 Type 2 diabetes mellitus without complications: Secondary | ICD-10-CM | POA: Diagnosis present

## 2020-08-25 DIAGNOSIS — Z79899 Other long term (current) drug therapy: Secondary | ICD-10-CM

## 2020-08-25 DIAGNOSIS — J45909 Unspecified asthma, uncomplicated: Secondary | ICD-10-CM | POA: Diagnosis present

## 2020-08-25 DIAGNOSIS — M48061 Spinal stenosis, lumbar region without neurogenic claudication: Secondary | ICD-10-CM | POA: Diagnosis present

## 2020-08-25 SURGERY — POSTERIOR LUMBAR FUSION 1 LEVEL
Anesthesia: General | Site: Back

## 2020-08-25 MED ORDER — PRAVASTATIN SODIUM 40 MG PO TABS
40.0000 mg | ORAL_TABLET | Freq: Every day | ORAL | Status: DC
  Administered 2020-08-25: 40 mg via ORAL
  Filled 2020-08-25: qty 1

## 2020-08-25 MED ORDER — DOCUSATE SODIUM 100 MG PO CAPS
100.0000 mg | ORAL_CAPSULE | Freq: Two times a day (BID) | ORAL | Status: DC
  Administered 2020-08-25 – 2020-08-26 (×2): 100 mg via ORAL
  Filled 2020-08-25 (×2): qty 1

## 2020-08-25 MED ORDER — ONDANSETRON HCL 4 MG/2ML IJ SOLN: 4.0000 mg | Freq: Once | INTRAMUSCULAR | Status: DC | PRN

## 2020-08-25 MED ORDER — KETOROLAC TROMETHAMINE 15 MG/ML IJ SOLN
15.0000 mg | Freq: Four times a day (QID) | INTRAMUSCULAR | Status: DC
  Administered 2020-08-25 – 2020-08-26 (×3): 15 mg via INTRAVENOUS
  Filled 2020-08-25 (×3): qty 1

## 2020-08-25 MED ORDER — CHLORHEXIDINE GLUCONATE 0.12 % MT SOLN
15.0000 mL | Freq: Once | OROMUCOSAL | Status: AC
  Administered 2020-08-25: 15 mL via OROMUCOSAL
  Filled 2020-08-25: qty 15

## 2020-08-25 MED ORDER — HYDROMORPHONE HCL 1 MG/ML IJ SOLN
0.2500 mg | INTRAMUSCULAR | Status: DC | PRN
  Administered 2020-08-25 (×2): 0.5 mg via INTRAVENOUS

## 2020-08-25 MED ORDER — ONDANSETRON HCL 4 MG/2ML IJ SOLN: 4.0000 mg | Freq: Four times a day (QID) | INTRAMUSCULAR | Status: DC | PRN

## 2020-08-25 MED ORDER — CHLORHEXIDINE GLUCONATE CLOTH 2 % EX PADS: 6.0000 | MEDICATED_PAD | Freq: Once | CUTANEOUS | Status: DC

## 2020-08-25 MED ORDER — MIDAZOLAM HCL 2 MG/2ML IJ SOLN
INTRAMUSCULAR | Status: DC | PRN
  Administered 2020-08-25: 2 mg via INTRAVENOUS

## 2020-08-25 MED ORDER — EPHEDRINE 5 MG/ML INJ
INTRAVENOUS | Status: AC
  Filled 2020-08-25: qty 20

## 2020-08-25 MED ORDER — ACETAMINOPHEN 325 MG PO TABS: 650.0000 mg | ORAL_TABLET | ORAL | Status: DC | PRN

## 2020-08-25 MED ORDER — BUPIVACAINE HCL (PF) 0.5 % IJ SOLN
INTRAMUSCULAR | Status: AC
  Filled 2020-08-25: qty 30

## 2020-08-25 MED ORDER — SODIUM CHLORIDE 0.9 % IV SOLN: 250.0000 mL | INTRAVENOUS | Status: DC

## 2020-08-25 MED ORDER — THROMBIN 5000 UNITS EX SOLR
OROMUCOSAL | Status: DC | PRN
  Administered 2020-08-25: 5 mL via TOPICAL

## 2020-08-25 MED ORDER — ORAL CARE MOUTH RINSE: 15.0000 mL | Freq: Once | OROMUCOSAL | Status: AC

## 2020-08-25 MED ORDER — TRAZODONE HCL 100 MG PO TABS
200.0000 mg | ORAL_TABLET | Freq: Every day | ORAL | Status: DC
  Administered 2020-08-25: 200 mg via ORAL
  Filled 2020-08-25: qty 2

## 2020-08-25 MED ORDER — FLEET ENEMA 7-19 GM/118ML RE ENEM: 1.0000 | ENEMA | Freq: Once | RECTAL | Status: DC | PRN

## 2020-08-25 MED ORDER — OXYCODONE HCL 5 MG PO TABS
5.0000 mg | ORAL_TABLET | ORAL | Status: DC | PRN
  Administered 2020-08-25 – 2020-08-26 (×4): 10 mg via ORAL
  Filled 2020-08-25 (×4): qty 2

## 2020-08-25 MED ORDER — METFORMIN HCL 500 MG PO TABS
500.0000 mg | ORAL_TABLET | Freq: Two times a day (BID) | ORAL | Status: DC
  Administered 2020-08-26: 500 mg via ORAL
  Filled 2020-08-25: qty 1

## 2020-08-25 MED ORDER — ESTRADIOL 1 MG PO TABS
2.0000 mg | ORAL_TABLET | Freq: Every day | ORAL | Status: DC
  Filled 2020-08-25 (×2): qty 1
  Filled 2020-08-25: qty 2
  Filled 2020-08-25: qty 1

## 2020-08-25 MED ORDER — DEXAMETHASONE SODIUM PHOSPHATE 10 MG/ML IJ SOLN
INTRAMUSCULAR | Status: AC
  Filled 2020-08-25: qty 2

## 2020-08-25 MED ORDER — PHENYLEPHRINE 40 MCG/ML (10ML) SYRINGE FOR IV PUSH (FOR BLOOD PRESSURE SUPPORT)
PREFILLED_SYRINGE | INTRAVENOUS | Status: DC | PRN
  Administered 2020-08-25: 80 ug via INTRAVENOUS
  Administered 2020-08-25: 120 ug via INTRAVENOUS

## 2020-08-25 MED ORDER — CEFAZOLIN SODIUM-DEXTROSE 2-4 GM/100ML-% IV SOLN
2.0000 g | Freq: Three times a day (TID) | INTRAVENOUS | Status: AC
  Administered 2020-08-25 – 2020-08-26 (×2): 2 g via INTRAVENOUS
  Filled 2020-08-25 (×2): qty 100

## 2020-08-25 MED ORDER — SODIUM CHLORIDE 0.9% FLUSH
3.0000 mL | Freq: Two times a day (BID) | INTRAVENOUS | Status: DC
  Administered 2020-08-25: 3 mL via INTRAVENOUS

## 2020-08-25 MED ORDER — LIDOCAINE 2% (20 MG/ML) 5 ML SYRINGE
INTRAMUSCULAR | Status: AC
  Filled 2020-08-25: qty 10

## 2020-08-25 MED ORDER — LIDOCAINE-EPINEPHRINE 1 %-1:100000 IJ SOLN
INTRAMUSCULAR | Status: DC | PRN
  Administered 2020-08-25: 5 mL

## 2020-08-25 MED ORDER — CEFAZOLIN SODIUM-DEXTROSE 2-4 GM/100ML-% IV SOLN
2.0000 g | INTRAVENOUS | Status: AC
  Administered 2020-08-25: 2 g via INTRAVENOUS
  Filled 2020-08-25: qty 100

## 2020-08-25 MED ORDER — ROCURONIUM BROMIDE 10 MG/ML (PF) SYRINGE
PREFILLED_SYRINGE | INTRAVENOUS | Status: DC | PRN
  Administered 2020-08-25 (×2): 20 mg via INTRAVENOUS
  Administered 2020-08-25: 60 mg via INTRAVENOUS
  Administered 2020-08-25: 20 mg via INTRAVENOUS

## 2020-08-25 MED ORDER — ACETAMINOPHEN 650 MG RE SUPP: 650.0000 mg | RECTAL | Status: DC | PRN

## 2020-08-25 MED ORDER — LIDOCAINE-EPINEPHRINE 1 %-1:100000 IJ SOLN
INTRAMUSCULAR | Status: AC
  Filled 2020-08-25: qty 1

## 2020-08-25 MED ORDER — DEXAMETHASONE SODIUM PHOSPHATE 10 MG/ML IJ SOLN
INTRAMUSCULAR | Status: DC | PRN
  Administered 2020-08-25: 10 mg via INTRAVENOUS

## 2020-08-25 MED ORDER — ONDANSETRON HCL 4 MG PO TABS: 4.0000 mg | ORAL_TABLET | Freq: Four times a day (QID) | ORAL | Status: DC | PRN

## 2020-08-25 MED ORDER — ROCURONIUM BROMIDE 10 MG/ML (PF) SYRINGE
PREFILLED_SYRINGE | INTRAVENOUS | Status: AC
  Filled 2020-08-25: qty 20

## 2020-08-25 MED ORDER — METHOCARBAMOL 500 MG PO TABS: 500.0000 mg | ORAL_TABLET | Freq: Four times a day (QID) | ORAL | Status: DC | PRN

## 2020-08-25 MED ORDER — MENTHOL 3 MG MT LOZG: 1.0000 | LOZENGE | OROMUCOSAL | Status: DC | PRN

## 2020-08-25 MED ORDER — METHOCARBAMOL 1000 MG/10ML IJ SOLN
500.0000 mg | Freq: Four times a day (QID) | INTRAVENOUS | Status: DC | PRN
  Filled 2020-08-25: qty 5

## 2020-08-25 MED ORDER — THROMBIN 5000 UNITS EX SOLR
CUTANEOUS | Status: AC
  Filled 2020-08-25: qty 5000

## 2020-08-25 MED ORDER — METHOCARBAMOL 500 MG PO TABS
500.0000 mg | ORAL_TABLET | Freq: Four times a day (QID) | ORAL | Status: DC | PRN
  Administered 2020-08-26: 500 mg via ORAL
  Filled 2020-08-25: qty 1

## 2020-08-25 MED ORDER — EPHEDRINE SULFATE-NACL 50-0.9 MG/10ML-% IV SOSY
PREFILLED_SYRINGE | INTRAVENOUS | Status: DC | PRN
  Administered 2020-08-25 (×2): 10 mg via INTRAVENOUS
  Administered 2020-08-25: 5 mg via INTRAVENOUS

## 2020-08-25 MED ORDER — PHENYLEPHRINE 40 MCG/ML (10ML) SYRINGE FOR IV PUSH (FOR BLOOD PRESSURE SUPPORT)
PREFILLED_SYRINGE | INTRAVENOUS | Status: AC
  Filled 2020-08-25: qty 10

## 2020-08-25 MED ORDER — MIDAZOLAM HCL 2 MG/2ML IJ SOLN
INTRAMUSCULAR | Status: AC
  Filled 2020-08-25: qty 2

## 2020-08-25 MED ORDER — ALUM & MAG HYDROXIDE-SIMETH 200-200-20 MG/5ML PO SUSP: 30.0000 mL | Freq: Four times a day (QID) | ORAL | Status: DC | PRN

## 2020-08-25 MED ORDER — FLUOXETINE HCL 20 MG PO CAPS
40.0000 mg | ORAL_CAPSULE | Freq: Every day | ORAL | Status: DC
  Administered 2020-08-26: 40 mg via ORAL
  Filled 2020-08-25: qty 2

## 2020-08-25 MED ORDER — EPINEPHRINE 0.125 MG/ACT IN AERO: INHALATION_SPRAY | Freq: Every day | RESPIRATORY_TRACT | Status: DC | PRN

## 2020-08-25 MED ORDER — ONDANSETRON HCL 4 MG/2ML IJ SOLN
INTRAMUSCULAR | Status: DC | PRN
  Administered 2020-08-25: 4 mg via INTRAVENOUS

## 2020-08-25 MED ORDER — PHENYLEPHRINE HCL-NACL 10-0.9 MG/250ML-% IV SOLN
INTRAVENOUS | Status: DC | PRN
  Administered 2020-08-25: 20 ug/min via INTRAVENOUS

## 2020-08-25 MED ORDER — MORPHINE SULFATE (PF) 2 MG/ML IV SOLN: 2.0000 mg | INTRAVENOUS | Status: DC | PRN

## 2020-08-25 MED ORDER — KETAMINE HCL 10 MG/ML IJ SOLN
INTRAMUSCULAR | Status: DC | PRN
  Administered 2020-08-25: 10 mg via INTRAVENOUS
  Administered 2020-08-25: 20 mg via INTRAVENOUS

## 2020-08-25 MED ORDER — BISACODYL 10 MG RE SUPP
10.0000 mg | Freq: Every day | RECTAL | Status: DC | PRN
Start: 2020-08-25 — End: 2020-08-26

## 2020-08-25 MED ORDER — ONDANSETRON HCL 4 MG/2ML IJ SOLN
INTRAMUSCULAR | Status: AC
  Filled 2020-08-25: qty 4

## 2020-08-25 MED ORDER — ACETAMINOPHEN 10 MG/ML IV SOLN: 1000.0000 mg | Freq: Once | INTRAVENOUS | Status: DC | PRN

## 2020-08-25 MED ORDER — 0.9 % SODIUM CHLORIDE (POUR BTL) OPTIME
TOPICAL | Status: DC | PRN
  Administered 2020-08-25: 1000 mL

## 2020-08-25 MED ORDER — POLYETHYLENE GLYCOL 3350 17 G PO PACK: 17.0000 g | PACK | Freq: Every day | ORAL | Status: DC | PRN

## 2020-08-25 MED ORDER — SUGAMMADEX SODIUM 200 MG/2ML IV SOLN
INTRAVENOUS | Status: DC | PRN
  Administered 2020-08-25: 200 mg via INTRAVENOUS

## 2020-08-25 MED ORDER — LIDOCAINE 2% (20 MG/ML) 5 ML SYRINGE
INTRAMUSCULAR | Status: DC | PRN
  Administered 2020-08-25: 60 mg via INTRAVENOUS

## 2020-08-25 MED ORDER — SODIUM CHLORIDE 0.9% FLUSH: 3.0000 mL | INTRAVENOUS | Status: DC | PRN

## 2020-08-25 MED ORDER — PHENOL 1.4 % MT LIQD: 1.0000 | OROMUCOSAL | Status: DC | PRN

## 2020-08-25 MED ORDER — PROPOFOL 10 MG/ML IV BOLUS
INTRAVENOUS | Status: AC
  Filled 2020-08-25: qty 20

## 2020-08-25 MED ORDER — ARIPIPRAZOLE 2 MG PO TABS
2.0000 mg | ORAL_TABLET | Freq: Every day | ORAL | Status: DC
  Administered 2020-08-26: 2 mg via ORAL
  Filled 2020-08-25: qty 1

## 2020-08-25 MED ORDER — HYDROMORPHONE HCL 1 MG/ML IJ SOLN
INTRAMUSCULAR | Status: AC
  Filled 2020-08-25: qty 1

## 2020-08-25 MED ORDER — BUPIVACAINE HCL (PF) 0.5 % IJ SOLN
INTRAMUSCULAR | Status: DC | PRN
  Administered 2020-08-25: 30 mL
  Administered 2020-08-25: 5 mL

## 2020-08-25 MED ORDER — SENNA 8.6 MG PO TABS
1.0000 | ORAL_TABLET | Freq: Two times a day (BID) | ORAL | Status: DC
  Administered 2020-08-25 – 2020-08-26 (×2): 8.6 mg via ORAL
  Filled 2020-08-25 (×2): qty 1

## 2020-08-25 MED ORDER — PROPOFOL 10 MG/ML IV BOLUS
INTRAVENOUS | Status: DC | PRN
  Administered 2020-08-25: 120 mg via INTRAVENOUS

## 2020-08-25 MED ORDER — FENTANYL CITRATE (PF) 250 MCG/5ML IJ SOLN
INTRAMUSCULAR | Status: DC | PRN
  Administered 2020-08-25: 25 ug via INTRAVENOUS
  Administered 2020-08-25: 150 ug via INTRAVENOUS

## 2020-08-25 MED ORDER — LORATADINE 10 MG PO TABS
10.0000 mg | ORAL_TABLET | Freq: Every day | ORAL | Status: DC
  Administered 2020-08-26: 10 mg via ORAL
  Filled 2020-08-25: qty 1

## 2020-08-25 MED ORDER — GABAPENTIN 300 MG PO CAPS
300.0000 mg | ORAL_CAPSULE | Freq: Three times a day (TID) | ORAL | Status: DC
  Administered 2020-08-25 – 2020-08-26 (×2): 300 mg via ORAL
  Filled 2020-08-25 (×2): qty 1

## 2020-08-25 MED ORDER — FENTANYL CITRATE (PF) 250 MCG/5ML IJ SOLN
INTRAMUSCULAR | Status: AC
  Filled 2020-08-25: qty 5

## 2020-08-25 MED ORDER — LACTATED RINGERS IV SOLN: INTRAVENOUS | Status: DC

## 2020-08-25 MED ORDER — KETAMINE HCL 50 MG/5ML IJ SOSY
PREFILLED_SYRINGE | INTRAMUSCULAR | Status: AC
  Filled 2020-08-25: qty 5

## 2020-08-25 SURGICAL SUPPLY — 60 items
ADH SKN CLS APL DERMABOND .7 (GAUZE/BANDAGES/DRESSINGS) ×1
APL SRG 60D 8 XTD TIP BNDBL (TIP)
BASKET BONE COLLECTION (BASKET) ×2 IMPLANT
BLADE CLIPPER SURG (BLADE) IMPLANT
BUR MATCHSTICK NEURO 3.0 LAGG (BURR) ×2 IMPLANT
CANISTER SUCT 3000ML PPV (MISCELLANEOUS) ×2 IMPLANT
CNTNR URN SCR LID CUP LEK RST (MISCELLANEOUS) ×1 IMPLANT
CONT SPEC 4OZ STRL OR WHT (MISCELLANEOUS) ×2
COVER BACK TABLE 60X90IN (DRAPES) ×2 IMPLANT
COVER WAND RF STERILE (DRAPES) ×2 IMPLANT
DECANTER SPIKE VIAL GLASS SM (MISCELLANEOUS) ×2 IMPLANT
DERMABOND ADVANCED (GAUZE/BANDAGES/DRESSINGS) ×1
DERMABOND ADVANCED .7 DNX12 (GAUZE/BANDAGES/DRESSINGS) ×1 IMPLANT
DEVICE DISSECT PLASMABLAD 3.0S (MISCELLANEOUS) ×1 IMPLANT
DRAPE C-ARM 42X72 X-RAY (DRAPES) ×4 IMPLANT
DRAPE HALF SHEET 40X57 (DRAPES) ×1 IMPLANT
DRAPE LAPAROTOMY 100X72X124 (DRAPES) ×2 IMPLANT
DRSG OPSITE POSTOP 4X6 (GAUZE/BANDAGES/DRESSINGS) ×1 IMPLANT
DURAPREP 26ML APPLICATOR (WOUND CARE) ×2 IMPLANT
DURASEAL APPLICATOR TIP (TIP) IMPLANT
DURASEAL SPINE SEALANT 3ML (MISCELLANEOUS) IMPLANT
ELECT REM PT RETURN 9FT ADLT (ELECTROSURGICAL) ×2
ELECTRODE REM PT RTRN 9FT ADLT (ELECTROSURGICAL) ×1 IMPLANT
GAUZE 4X4 16PLY RFD (DISPOSABLE) IMPLANT
GAUZE SPONGE 4X4 12PLY STRL (GAUZE/BANDAGES/DRESSINGS) ×2 IMPLANT
GLOVE BIOGEL PI IND STRL 8.5 (GLOVE) ×2 IMPLANT
GLOVE BIOGEL PI INDICATOR 8.5 (GLOVE) ×2
GLOVE ECLIPSE 8.5 STRL (GLOVE) ×4 IMPLANT
GOWN STRL REUS W/ TWL LRG LVL3 (GOWN DISPOSABLE) IMPLANT
GOWN STRL REUS W/ TWL XL LVL3 (GOWN DISPOSABLE) IMPLANT
GOWN STRL REUS W/TWL 2XL LVL3 (GOWN DISPOSABLE) ×4 IMPLANT
GOWN STRL REUS W/TWL LRG LVL3 (GOWN DISPOSABLE)
GOWN STRL REUS W/TWL XL LVL3 (GOWN DISPOSABLE)
HEMOSTAT POWDER KIT SURGIFOAM (HEMOSTASIS) IMPLANT
KIT BASIN OR (CUSTOM PROCEDURE TRAY) ×2 IMPLANT
KIT TURNOVER KIT B (KITS) ×2 IMPLANT
MILL MEDIUM DISP (BLADE) ×2 IMPLANT
NEEDLE HYPO 22GX1.5 SAFETY (NEEDLE) ×2 IMPLANT
NS IRRIG 1000ML POUR BTL (IV SOLUTION) ×2 IMPLANT
PACK LAMINECTOMY NEURO (CUSTOM PROCEDURE TRAY) ×2 IMPLANT
PAD ARMBOARD 7.5X6 YLW CONV (MISCELLANEOUS) ×6 IMPLANT
PATTIES SURGICAL .5 X1 (DISPOSABLE) ×2 IMPLANT
PLASMABLADE 3.0S (MISCELLANEOUS) ×2
ROD RELINE LOROTIC TI 5.5X55MM (Rod) ×1 IMPLANT
ROD RELINE-O LORDOTIC 5.5X60MM (Rod) ×1 IMPLANT
SCREW LOCK RELINE 5.5 TULIP (Screw) ×2 IMPLANT
SCREW RELINE-O POLY 6.5X45 (Screw) ×2 IMPLANT
SPONGE LAP 4X18 RFD (DISPOSABLE) IMPLANT
SPONGE SURGIFOAM ABS GEL 100 (HEMOSTASIS) ×2 IMPLANT
SUT PROLENE 6 0 BV (SUTURE) IMPLANT
SUT VIC AB 1 CT1 18XBRD ANBCTR (SUTURE) ×1 IMPLANT
SUT VIC AB 1 CT1 8-18 (SUTURE) ×2
SUT VIC AB 2-0 CP2 18 (SUTURE) ×2 IMPLANT
SUT VIC AB 3-0 SH 8-18 (SUTURE) ×2 IMPLANT
SUT VIC AB 4-0 RB1 18 (SUTURE) ×2 IMPLANT
SYR 3ML LL SCALE MARK (SYRINGE) ×8 IMPLANT
TOWEL GREEN STERILE (TOWEL DISPOSABLE) ×2 IMPLANT
TOWEL GREEN STERILE FF (TOWEL DISPOSABLE) ×2 IMPLANT
TRAY FOLEY MTR SLVR 16FR STAT (SET/KITS/TRAYS/PACK) ×2 IMPLANT
WATER STERILE IRR 1000ML POUR (IV SOLUTION) ×2 IMPLANT

## 2020-08-25 NOTE — Anesthesia Preprocedure Evaluation (Addendum)
Anesthesia Evaluation  Patient identified by MRN, date of birth, ID band Patient awake    Reviewed: Allergy & Precautions, NPO status , Patient's Chart, lab work & pertinent test results  Airway Mallampati: II  TM Distance: >3 FB Neck ROM: Full    Dental  (+) Edentulous Upper, Edentulous Lower   Pulmonary asthma , Patient abstained from smoking., former smoker,    Pulmonary exam normal        Cardiovascular negative cardio ROS   Rhythm:Regular Rate:Normal     Neuro/Psych Anxiety Depression negative neurological ROS     GI/Hepatic negative GI ROS, Neg liver ROS,   Endo/Other  diabetes, Oral Hypoglycemic Agents  Renal/GU negative Renal ROS   cervical cancer    Musculoskeletal  (+) Arthritis , Osteoarthritis,    Abdominal (+)  Abdomen: soft. Bowel sounds: normal.  Peds  Hematology negative hematology ROS (+)   Anesthesia Other Findings   Reproductive/Obstetrics                            Anesthesia Physical Anesthesia Plan  ASA: II  Anesthesia Plan: General   Post-op Pain Management:    Induction: Intravenous  PONV Risk Score and Plan: 3 and Ondansetron, Dexamethasone, Midazolam and Treatment may vary due to age or medical condition  Airway Management Planned: Mask and Oral ETT  Additional Equipment: None  Intra-op Plan:   Post-operative Plan: Extubation in OR  Informed Consent: I have reviewed the patients History and Physical, chart, labs and discussed the procedure including the risks, benefits and alternatives for the proposed anesthesia with the patient or authorized representative who has indicated his/her understanding and acceptance.     Dental advisory given  Plan Discussed with: CRNA  Anesthesia Plan Comments: (Lab Results      Component                Value               Date                      WBC                      10.1                08/21/2020                 HGB                      13.0                08/21/2020                HCT                      40.5                08/21/2020                MCV                      92.3                08/21/2020                PLT  288                 08/21/2020           Lab Results      Component                Value               Date                      NA                       137                 11/01/2019                K                        4.1                 11/01/2019                CO2                      24                  11/01/2019                GLUCOSE                  92                  11/01/2019                BUN                      12                  11/01/2019                CREATININE               0.73                11/01/2019                CALCIUM                  9.0                 11/01/2019                GFRNONAA                 >60                 11/01/2019                GFRAA                    >60                 11/01/2019          )        Anesthesia Quick Evaluation

## 2020-08-25 NOTE — Anesthesia Procedure Notes (Signed)
Procedure Name: Intubation Date/Time: 08/25/2020 12:39 PM Performed by: Thelma Comp, CRNA Pre-anesthesia Checklist: Patient identified, Emergency Drugs available, Suction available and Patient being monitored Patient Re-evaluated:Patient Re-evaluated prior to induction Oxygen Delivery Method: Circle System Utilized Preoxygenation: Pre-oxygenation with 100% oxygen Induction Type: IV induction Ventilation: Mask ventilation without difficulty Laryngoscope Size: Mac and 3 Grade View: Grade I Tube type: Oral Tube size: 7.0 mm Number of attempts: 1 Airway Equipment and Method: Stylet and Oral airway Placement Confirmation: ETT inserted through vocal cords under direct vision,  positive ETCO2 and breath sounds checked- equal and bilateral Secured at: 22 cm Tube secured with: Tape Dental Injury: Teeth and Oropharynx as per pre-operative assessment

## 2020-08-25 NOTE — Anesthesia Postprocedure Evaluation (Signed)
Anesthesia Post Note  Patient: [name]  Procedure(s) Performed: Revision Pseudoarthrosis Lumbar Three- Lumbar Four, Lumbar Four-Five Posterior lateral fusion (N/A Back)     Patient location during evaluation: PACU Anesthesia Type: General Level of consciousness: awake and alert Pain management: pain level controlled Vital Signs Assessment: post-procedure vital signs reviewed and stable Respiratory status: spontaneous breathing, nonlabored ventilation, respiratory function stable and patient connected to nasal cannula oxygen Cardiovascular status: blood pressure returned to baseline and stable Postop Assessment: no apparent nausea or vomiting Anesthetic complications: no   No complications documented.  Last Vitals:  Vitals:   08/25/20 1635 08/25/20 1648  BP: (!) 104/57 (!) 107/54  Pulse: 74 71  Resp:    Temp:  36.6 C  SpO2: 96% 95%    Last Pain:  Vitals:   08/25/20 1844  TempSrc:   PainSc: 4                  Maclain Cohron P Katie Faraone

## 2020-08-25 NOTE — Transfer of Care (Signed)
Immediate Anesthesia Transfer of Care Note  Patient: [name]  Procedure(s) Performed: Revision Pseudoarthrosis Lumbar Three- Lumbar Four, Lumbar Four-Five Posterior lateral fusion (N/A Back)  Patient Location: PACU  Anesthesia Type:General  Level of Consciousness: drowsy and patient cooperative  Airway & Oxygen Therapy: Patient Spontanous Breathing and Patient connected to face mask oxygen  Post-op Assessment: Report given to RN and Post -op Vital signs reviewed and stable  Post vital signs: Reviewed and stable  Last Vitals:  Vitals Value Taken Time  BP 133/59 08/25/20 1533  Temp    Pulse 78 08/25/20 1533  Resp 18 08/25/20 1533  SpO2 100 % 08/25/20 1533  Vitals shown include unvalidated device data.  Last Pain:  Vitals:   08/25/20 0904  TempSrc:   PainSc: 0-No pain         Complications: No complications documented.

## 2020-08-25 NOTE — H&P (Signed)
[name] is an 60 y.o. female.   Chief Complaint: Back pain HPI: [name] is a 60 year old individuals had previous decompression and fusion at L4-L5 posteriorly and an anterolateral decompression at L3-L4.  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  Is been advised regarding need for surgical decompression stabilization of L3-L4.  She is now admitted for this procedure.  Past Medical History:  Diagnosis Date  . Anxiety   . Arthritis   . Asthma   . Cancer ([place])    cervical cancer; pt states a long long time ago  . Depression   . Hyperlipemia     Past Surgical History:  Procedure Laterality Date  . ABDOMINAL HYSTERECTOMY    . ANTERIOR LAT LUMBAR FUSION N/A 11/05/2019   Procedure: Lumbar Four-Five Anterolateral lumbar interbody fusion with lateral plate;  Surgeon: Kristeen Miss, MD;  Location: [facility];  Service: Neurosurgery;  Laterality: N/A;  anterolateral  . BACK SURGERY     pins  . BACK SURGERY  2010  . BLADDER REPAIR    . CESAREAN SECTION  1982, 1984  . COLONOSCOPY N/A 06/12/2013   Procedure: COLONOSCOPY;  Surgeon: Rogene Houston, MD;  [facility]: [facility];  Service: Endoscopy;  Laterality: N/A;  930-moved to 1020 Ann to notify pt  . KNEE ARTHROSCOPY WITH MEDIAL MENISECTOMY Right 05/09/2019   Procedure: KNEE ARTHROSCOPY WITH MEDIAL MENISECTOMY AND LATERAL MENISECTOMY;  Surgeon: Carole Civil, MD;  Location: [facility];  Service: Orthopedics;  Laterality: Right;  . KNEE ARTHROSCOPY WITH MEDIAL MENISECTOMY Left 06/27/2019   Procedure: KNEE ARTHROSCOPY WITH MEDIAL MENISCECTOMY;  Surgeon: Carole Civil, MD;  Location: [facility];  Service: Orthopedics;  Laterality: Left;    Family History  Problem Relation Age of Onset  . Colon cancer Other    Social History:  reports that she has quit smoking. Her smoking use included cigarettes. She has a 20.00 pack-year smoking history. She has never used smokeless tobacco. She reports that she does  not drink alcohol and does not use drugs.  Allergies: No Known Allergies  Medications Prior to Admission  Medication Sig Dispense Refill  . acetaminophen (TYLENOL) 500 MG tablet Take 1,000 mg by mouth every 6 (six) hours as needed for moderate pain.    . ARIPiprazole (ABILIFY) 2 MG tablet Take 2 mg by mouth daily.    . Cholecalciferol (VITAMIN D3) 50 MCG (2000 UT) capsule Take 2,000 Units by mouth daily.     Marland Kitchen estradiol (ESTRACE) 2 MG tablet Take 1 tablet (2 mg total) by mouth at bedtime. 30 tablet 11  . FLUoxetine (PROZAC) 40 MG capsule Take 40 mg by mouth daily.    Marland Kitchen gabapentin (NEURONTIN) 300 MG capsule Take 1 capsule (300 mg total) by mouth 3 (three) times daily. 21 capsule 3  . loratadine (CLARITIN) 10 MG tablet Take 10 mg by mouth daily.    . metFORMIN (GLUCOPHAGE) 500 MG tablet Take 500 mg by mouth 2 (two) times daily with a meal.    . methocarbamol (ROBAXIN) 500 MG tablet Take 1 tablet (500 mg total) by mouth every 6 (six) hours as needed for muscle spasms. 40 tablet 3  . Multiple Vitamin (MULTIVITAMIN WITH MINERALS) TABS tablet Take 1 tablet by mouth daily.    . naproxen (NAPROSYN) 500 MG tablet Take 500 mg by mouth 2 (two) times daily with a meal.    . naproxen sodium (ALEVE) 220 MG tablet Take 220 mg by mouth daily as needed (pain).    Marland Kitchen  oxyCODONE (OXY IR/ROXICODONE) 5 MG immediate release tablet Take 1 tablet (5 mg total) by mouth every 4 (four) hours as needed for severe pain. (Patient taking differently: Take 5-10 mg by mouth every 6 (six) hours as needed for severe pain.) 30 tablet 0  . pravastatin (PRAVACHOL) 40 MG tablet Take 40 mg by mouth at bedtime.     . traZODone (DESYREL) 100 MG tablet Take 200 mg by mouth at bedtime.    Marland Kitchen EPINEPHrine (PRIMATENE MIST IN) Inhale 1 puff into the lungs daily as needed (shortness of breath).      No results found for this or any previous visit (from the past 48 hour(s)). No results found.  Review of Systems  Constitutional: Positive for  activity change.  HENT: Negative.   Eyes: Negative.   Respiratory: Negative.   Cardiovascular: Negative.   Gastrointestinal: Negative.   Endocrine: Negative.   Musculoskeletal: Positive for back pain.  Allergic/Immunologic: Negative.   Neurological: Positive for weakness, light-headedness and numbness.  Hematological: Negative.   Psychiatric/Behavioral: Negative.     Blood pressure (!) 126/51, pulse 71, temperature 98.3 F (36.8 C), temperature source Oral, resp. rate 17, height 5\' 2"  (1.575 m), weight 108.9 kg, SpO2 96 %. Physical Exam Constitutional:      Appearance: She is obese.  HENT:     Head: Normocephalic and atraumatic.     Nose: Nose normal.     Mouth/Throat:     Mouth: Mucous membranes are moist.  Eyes:     Extraocular Movements: Extraocular movements intact.  Cardiovascular:     Rate and Rhythm: Normal rate and regular rhythm.     Pulses: Normal pulses.     Heart sounds: Normal heart sounds.  Pulmonary:     Breath sounds: Normal breath sounds.  Abdominal:     Palpations: Abdomen is soft.  Musculoskeletal:     Cervical back: Neck supple.     Comments: Tenderness to palpation and percussion of the lumbar spine.  Straight leg raising is positive at 30 degrees Patrick's maneuver is negative bilaterally.  Skin:    General: Skin is warm and dry.     Capillary Refill: Capillary refill takes less than 2 seconds.  Neurological:     Mental Status: She is alert.     Comments: Cranial nerve examination is within the limits of normal.  Upper extremity strength is normal deep tendon reflexes in the lower extremities are absent in the patellae and the Achilles.  Modest weakness in the tibialis anterior bilaterally at 4 out of 5.  Extensor houses longus is intact.  Psychiatric:        Mood and Affect: Mood normal.        Behavior: Behavior normal.        Thought Content: Thought content normal.        Judgment: Judgment normal.      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  Plan: Posterior fixation with posterior lateral arthrodesis L3-L4.  Decompression via laminectomy.  Earleen Newport, MD 08/25/2020, 12:18 PM

## 2020-08-25 NOTE — Op Note (Signed)
Date of surgery: 08/25/2020 Preoperative diagnosis: Pseudoarthrosis L3-4 status post arthrodesis L4-5 lumbar spinal stenosis L3-4 lumbar radiculopathy Postoperative diagnosis: Same Procedure: Bilateral laminectomies decompression of L3 and L4 specifically decompressing the L3 and L4 nerve roots.  Posterior lateral arthrodesis with local autograft L3-L4.  Posterior fixation L3-L5 with revision of pedicle fixation from L4-5 to include L3-L4 and L5 segmentally. Surgeon: Kristeen Miss First Assistant: Duffy Rhody, MD Anesthesia: General endotracheal Indications: [name] is a 60 year old individuals had significant back and bilateral lower extremity pain.  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  She unfortunately developed a pseudoarthrosis in this area and having had increasing pain and recurrent back and leg pain she is advised regarding the need for surgical decompression posterior fixation of the pseudoarthrosis with posterior lateral arthrodesis.  She is admitted for that procedure now.  Procedure: Patient was brought to the operating room supine on the stretcher after the smooth induction of general endotracheal anesthesia and placement of a Foley catheter she was carefully turned prone and the back was prepped with alcohol DuraPrep and draped in a sterile fashion.  After an appropriate timeout the back was opened with a midline incision after infiltrating with 10 cc of lidocaine with epinephrine mixed 50-50 with half percent Marcaine.  The dissection was carried down to the lumbodorsal fascia which was opened on either side midline.  For spinous process was noted to be that of L3 and then the hardware was identified by further dissection in the subperiosteal space out laterally to expose the hardware at L4-L5.  The transverse process of L3 was then  exposed as was the facet joints at the L3-4 level.  Lateral gutters were packed off in this area for later use in grafting then a laminectomy was created removing the inferior margin lamina of L3 out to and including the entirety of the facet at L3-4.  Common dural tube was then carefully exposed and decompressed of thickened redundant yellow ligament first on the left side and on the right side individual pads of the L3 and L4 nerve roots were then carefully decompressed and dissected out.  The disc space was noted to have a slight bulge posteriorly but with the decompression posteriorly it was noted to be well decompressed and not creating any problem on the exiting nerve roots.  The traversing L4 nerve root was decompressed into the Spath out the foramen.  Once this was all accomplished pedicle entry sites were chosen at L3 and then using fluoroscopic guidance 6.5 x 45 mm pedicle screws were placed in the L3 pedicle and vertebrae.  There positioning was checked radiographically.  Once verified the previous hardware had the screw caps loosening of L4 and L5 this was [employer] hardware and was in good condition.  The rod was removed.  The saddles were loosened and moved about appropriately.  Lateral gutters which had previously been decorticated and packed away were then filled with approximately 5 cc of autograft bone that had been harvested from the laminectomies.  This was packed into each lateral gutter left and right.  Then on the right side a 60 mm precontoured rod was placed in between the saddles and tightened in a neutral construct on the left side a 55 mm rod was used to secure the screws at L3-L4 and L5.  Once this was accomplished hemostasis in the soft tissues was obtained the grafting site was checked  meticulously the path of the L3 and L4 nerve root were checked to make sure they are adequately decompressed and then the wound was closed with #1 Vicryl interrupted fashion 2-0 Vicryl was used in  subcutaneous tissues 3-0 Vicryl subcuticularly along with 4-0 Vicryl that was used to close the final subcuticular layer blood loss was estimated 250 cc and the patient was returned to recovery room in stable condition.

## 2020-08-26 DIAGNOSIS — E785 Hyperlipidemia, unspecified: Secondary | ICD-10-CM | POA: Diagnosis present

## 2020-08-26 DIAGNOSIS — Z7984 Long term (current) use of oral hypoglycemic drugs: Secondary | ICD-10-CM | POA: Diagnosis not present

## 2020-08-26 DIAGNOSIS — Z87891 Personal history of nicotine dependence: Secondary | ICD-10-CM | POA: Diagnosis not present

## 2020-08-26 DIAGNOSIS — J45909 Unspecified asthma, uncomplicated: Secondary | ICD-10-CM | POA: Diagnosis present

## 2020-08-26 DIAGNOSIS — M48061 Spinal stenosis, lumbar region without neurogenic claudication: Secondary | ICD-10-CM | POA: Diagnosis present

## 2020-08-26 DIAGNOSIS — Z8541 Personal history of malignant neoplasm of cervix uteri: Secondary | ICD-10-CM | POA: Diagnosis not present

## 2020-08-26 DIAGNOSIS — E119 Type 2 diabetes mellitus without complications: Secondary | ICD-10-CM | POA: Diagnosis present

## 2020-08-26 DIAGNOSIS — M4726 Other spondylosis with radiculopathy, lumbar region: Secondary | ICD-10-CM | POA: Diagnosis present

## 2020-08-26 DIAGNOSIS — Z79899 Other long term (current) drug therapy: Secondary | ICD-10-CM | POA: Diagnosis not present

## 2020-08-26 DIAGNOSIS — Z79818 Long term (current) use of other agents affecting estrogen receptors and estrogen levels: Secondary | ICD-10-CM | POA: Diagnosis not present

## 2020-08-26 DIAGNOSIS — M960 Pseudarthrosis after fusion or arthrodesis: Secondary | ICD-10-CM | POA: Diagnosis present

## 2020-08-26 MED ORDER — METHOCARBAMOL 500 MG PO TABS: 500.0000 mg | ORAL_TABLET | Freq: Four times a day (QID) | ORAL | 0 refills | Status: DC | PRN

## 2020-08-26 MED ORDER — OXYCODONE HCL 5 MG PO TABS: 5.0000 mg | ORAL_TABLET | ORAL | 0 refills | Status: DC | PRN

## 2020-08-26 MED ORDER — DOCUSATE SODIUM 100 MG PO CAPS: 100.0000 mg | ORAL_CAPSULE | Freq: Two times a day (BID) | ORAL | 2 refills | Status: DC

## 2020-08-26 NOTE — Evaluation (Signed)
Occupational Therapy Evaluation Patient Details Name: [name] MRN: [id number] DOB: June 30, 1961 Today's Date: 08/26/2020    History of Present Illness 60 y.o. 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   Clinical Impression   PTA patient was living with her spouse in a single-level private residence and was completing ADLs/IADLs with use of AD reporting increased difficulty over last several weeks 2/2 back pain radiating into BLE. Patient currently presents at baseline level of function demonstrating ADLs, ADL transfers and short-distance functional mobility with supervision A to Mod I with use of RW. OT provided education on spinal precautions, home set-up to maximize safety and independence with self-care tasks, and acquisition/use of AE. Patient expressed verbal understanding. Patient does not require continued acute occupational therapy services with OT to sign off at this time.       Follow Up Recommendations  No OT follow up;Supervision - Intermittent    Equipment Recommendations  None recommended by OT (Patient has necessary DME.)    Recommendations for Other Services       Precautions / Restrictions Precautions Precautions: Back Precaution Booklet Issued: No Precaution Comments: Reviewed back precautions. Able to recall 2/3 initially and 3/3 at conclusion of session. Good adherence during ADLs. Required Braces or Orthoses: Spinal Brace Spinal Brace: Lumbar corset Restrictions Weight Bearing Restrictions: No      Mobility Bed Mobility Overal bed mobility: Modified Independent                  Transfers Overall transfer level: Needs assistance Equipment used: Rolling walker (2 wheeled) Transfers: Sit to/from Stand Sit to Stand: Supervision         General transfer  comment: Supervision A for safety. Cues for hand placement and power negotiation.    Balance Overall balance assessment: Needs assistance Sitting-balance support: No upper extremity supported;Feet supported Sitting balance-Leahy Scale: Good     Standing balance support: Single extremity supported;Bilateral upper extremity supported;During functional activity Standing balance-Leahy Scale: Fair Standing balance comment: Able to maintain static standing balance without UE support on RW during LB dressing tasks.                           ADL either performed or assessed with clinical judgement   ADL Overall ADL's : At baseline                                       General ADL Comments: Demonstrates bed mobility, ADL transfers, and UB/LB dressing with Mod I and use of RW. Good adherence to back precautions.     Vision Baseline Vision/History: Wears glasses Wears Glasses: At all times Patient Visual Report: No change from baseline       Perception     Praxis      Pertinent Vitals/Pain Pain Assessment: 0-10 Pain Score: 9  Pain Location: Low back Pain Descriptors / Indicators: Operative site guarding;Grimacing (Pulling) Pain Intervention(s): Limited activity within patient's tolerance;Monitored during session;Repositioned     Hand Dominance Right   Extremity/Trunk Assessment Upper Extremity Assessment Upper Extremity Assessment: Overall WFL for tasks assessed   Lower Extremity Assessment Lower Extremity Assessment: Defer to PT evaluation   Cervical / Trunk Assessment Cervical / Trunk Assessment: Other exceptions Cervical / Trunk Exceptions:  s/p spinal surgery.   Communication Communication Communication: No difficulties   Cognition Arousal/Alertness: Awake/alert Behavior During Therapy: WFL for tasks assessed/performed Overall Cognitive Status: Within Functional Limits for tasks assessed                                      General Comments  Clean, dry dressing at incision. Patient with multiple open sores on R shoulder and L posterior aspect of forearm. Patient reports that she "picks" at her skin when feeling stressed. Patient notes increased anxiety leading up to surgery.    Exercises     Shoulder Instructions      Home Living Family/patient expects to be discharged to:: Private residence Living Arrangements: Spouse/significant other Available Help at Discharge: Family;Available 24 hours/day Type of Home: House Home Access: Stairs to enter [employer] of Steps: 3 Entrance Stairs-Rails: Left Home Layout: One level     Bathroom Shower/Tub: Occupational psychologist: Standard Bathroom Accessibility: Yes How Accessible: Accessible via walker [facility] bed;Walker - 4 wheels;Walker - 2 wheels;Bedside commode;Tub bench;Kasandra Knudsen - single point [facility] bed from mother.)   Additional Comments: Plans to sleep in hospital bed upon return home.      Prior Functioning/Environment Level of Independence: Independent with assistive device(s)        Comments: SOB with activity at baseline. Use of RW in home and use of SPC in community dwellings. Has not driven in over a week 2/2 back pain.        OT Problem List:        OT Treatment/Interventions:      OT Goals(Current goals can be found in the care plan section) Acute Rehab OT Goals Patient Stated Goal: To return home. OT Goal Formulation: With patient Time For Goal Achievement: 09/09/20 Potential to Achieve Goals: Good  OT Frequency:     Barriers to D/C:            Co-evaluation              AM-PAC OT "6 Clicks" Daily Activity     Outcome Measure Help from another person eating meals?: None Help from another person taking care of personal grooming?: None Help from another person toileting, which includes using toliet, bedpan, or urinal?: A Little Help from another person bathing (including washing,  rinsing, drying)?: None Help from another person to put on and taking off regular upper body clothing?: None Help from another person to put on and taking off regular lower body clothing?: None 6 Click Score: 23   End of Session Equipment Utilized During Treatment: Rolling walker;Back brace Nurse Communication: Mobility status  Activity Tolerance: Patient tolerated treatment well Patient left: in chair;with call bell/phone within reach                   Time: 0731-0751 OT Time Calculation (min): 20 min Charges:  OT General Charges $OT Visit: 1 Visit OT Evaluation $OT Eval Low Complexity: 1 Low  Anne Boltz H. OTR/L Supplemental OT, Department of rehab services [phone]  Mikiyah Glasner R H. 08/26/2020, 8:59 AM

## 2020-08-26 NOTE — Discharge Summary (Deleted)
  The note originally documented on this encounter has been moved the the encounter in which it belongs.  

## 2020-08-26 NOTE — Evaluation (Signed)
Physical Therapy Evaluation Patient Details Name: [name] MRN: [id number] DOB: 1960-09-25 Today's Date: 08/26/2020   History of Present Illness  60 y.o. 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  Clinical Impression   Patient received in recliner, very pleasant and very motivated to mobilize with PT despite high pain levels. Able to mobilize well today without assistive device with good recall and carryover of lumbar precautions, but somewhat limited by pain and did require MinA for stair navigation (which family is able to provide). Verbally reviewed lumbar precautions as well as car transfers and physically practiced bed mobility. Left up in recliner with all needs met, spouse present. Will benefit from [place] f/u at DC.     Follow Up Recommendations Home health PT    Equipment Recommendations  Other (comment) (well equipped)    Recommendations for Other Services       Precautions / Restrictions Precautions Precautions: Back Precaution Booklet Issued: No Precaution Comments: able to recall 3/3 precautions easily, good adherence and application during mobility Required Braces or Orthoses: Spinal Brace Spinal Brace: Lumbar corset;Applied in sitting position Restrictions Weight Bearing Restrictions: No      Mobility  Bed Mobility Overal bed mobility: Modified Independent Bed Mobility: Rolling;Sidelying to Sit;Sit to Sidelying Rolling: Modified independent (Device/Increase time) Sidelying to sit: Modified independent (Device/Increase time)     Sit to sidelying: Modified independent (Device/Increase time) General bed mobility comments: occasional VC for technique, otherwise Mod(I) with no physical assist given    Transfers Overall transfer level: Needs assistance Equipment used:  None Transfers: Sit to/from Stand Sit to Stand: Supervision         General transfer comment: S for safety, good carryover of hand placement and sequencing, adherence to precautions  Ambulation/Gait Ambulation/Gait assistance: Min guard Gait Distance (Feet): 120 Feet Assistive device: None Gait Pattern/deviations: Step-through pattern;Decreased step length - right;Decreased step length - left;Wide base of support;Trunk flexed;Antalgic Gait velocity: decreased   General Gait Details: slow and steady without assistive device, good adherence to precautions during gait. Had some DOE which she reports is due to pain  Stairs Stairs: Yes Stairs assistance: Min assist Stair Management: One rail Left;Step to pattern;Forwards Number of Stairs: 3 General stair comments: MinA for stability and balance, cues for step navigation and safety  Wheelchair Mobility    Modified Rankin (Stroke Patients Only)       Balance Overall balance assessment: Needs assistance Sitting-balance support: No upper extremity supported;Feet supported Sitting balance-Leahy Scale: Good     Standing balance support: During functional activity;No upper extremity supported Standing balance-Leahy Scale: Fair Standing balance comment: mildly unsteady but able to maintain balance without UE support                             Pertinent Vitals/Pain Pain Assessment: 0-10 Pain Score: 10-Worst pain ever Pain Location: Low back Pain Descriptors / Indicators: Operative site guarding;Grimacing Pain Intervention(s): Limited activity within patient's tolerance;Monitored during session;Patient requesting pain meds-RN notified;RN gave pain meds during session    Home Living Family/patient expects to be discharged to:: Private residence Living Arrangements: Spouse/significant other Available Help at Discharge: Family;Available 24 hours/day Type of Home: House Home Access: Stairs to enter Entrance  Stairs-Rails: Left Entrance Stairs-Number of Steps: 3 Home Layout: One level Home Equipment: Hospital bed;Walker - 4 wheels;Walker - 2  wheels;Bedside commode;Tub bench;Kasandra Knudsen - single point [facility] bed from mother.) Additional Comments: Plans to sleep in hospital bed upon return home.    Prior Function Level of Independence: Independent with assistive device(s)         Comments: SOB with activity at baseline. Use of RW in home and use of SPC in community dwellings. Has not driven in over a week 2/2 back pain.     Hand Dominance   Dominant Hand: Right    Extremity/Trunk Assessment   Upper Extremity Assessment Upper Extremity Assessment: Defer to OT evaluation    Lower Extremity Assessment Lower Extremity Assessment: Generalized weakness    Cervical / Trunk Assessment Cervical / Trunk Assessment: Other exceptions Cervical / Trunk Exceptions: s/p spinal surgery.  Communication   Communication: No difficulties  Cognition Arousal/Alertness: Awake/alert Behavior During Therapy: WFL for tasks assessed/performed Overall Cognitive Status: Within Functional Limits for tasks assessed                                 General Comments: very pleasant and eager to go home today, requesting to walk as soon as possible this AM      General Comments General comments (skin integrity, edema, etc.): Clean, dry dressing at incision. Patient with multiple open sores on R shoulder and L posterior aspect of forearm. Patient reports that she "picks" at her skin when feeling stressed. Patient notes increased anxiety leading up to surgery.    Exercises     Assessment/Plan    PT Assessment Patient needs continued PT services  PT Problem List Decreased strength;Decreased knowledge of use of DME;Obesity;Decreased activity tolerance;Decreased balance;Decreased knowledge of precautions;Pain;Decreased mobility       PT Treatment Interventions DME instruction;Balance training;Gait  training;Stair training;Functional mobility training;Patient/family education;Therapeutic activities;Therapeutic exercise    PT Goals (Current goals can be found in the Care Plan section)  Acute Rehab PT Goals Patient Stated Goal: To return home. PT Goal Formulation: With patient/family Time For Goal Achievement: 09/09/20 Potential to Achieve Goals: Good    Frequency Min 5X/week   Barriers to discharge        Co-evaluation               AM-PAC PT "6 Clicks" Mobility  Outcome Measure Help needed turning from your back to your side while in a flat bed without using bedrails?: A Little Help needed moving from lying on your back to sitting on the side of a flat bed without using bedrails?: A Little Help needed moving to and from a bed to a chair (including a wheelchair)?: A Little Help needed standing up from a chair using your arms (e.g., wheelchair or bedside chair)?: A Little Help needed to walk in hospital room?: A Little Help needed climbing 3-5 steps with a railing? : A Little 6 Click Score: 18    End of Session Equipment Utilized During Treatment: Back brace Activity Tolerance: Patient tolerated treatment well Patient left: in chair;with call bell/phone within reach;with family/visitor present Nurse Communication: Mobility status PT Visit Diagnosis: Muscle weakness (generalized) (M62.81);Pain;Difficulty in walking, not elsewhere classified (R26.2) Pain - Right/Left:  (midline) Pain - part of body:  (back)    Time: 2458-0998 PT Time Calculation (min) (ACUTE ONLY): 17 min   Charges:   PT Evaluation $PT Eval Moderate Complexity: 1 Mod          Miroslava Santellan U PT, DPT, PN1   Supplemental Physical Therapist [place]  Pager [phone] Acute Rehab Office [phone]

## 2020-08-26 NOTE — Plan of Care (Signed)
Pt doing well. Pt and husband given D/C instructions with verbal understanding. Pt's incision is clean and dry with no sign of infection. Pt's IV was removed prior to D/C. TOC was notified of Home Health needs, Pt requested to leave and would like TOC to call her at home. Pt D/C'd home via wheelchair per MD order. Pt is stable @ D/C and has no other needs at this time. Holli Humbles, RN

## 2020-08-26 NOTE — TOC Progression Note (Signed)
Transition of Care [facility]) - Progression Note    Patient Details  Name: QUIN MCPHERSON MRN: [id number] Date of Birth: 11/30/1960  [facility] CM/SW Contact  Angelita Ingles, RN Phone Number: [phone]  08/26/2020, 10:25 AM  Clinical Narrative:    Ambulatory referral has been entered patient has left before CM could speak with patient. CM has attempted to call patient with no answer. Voicemail has been left. TOC will sign off.        Expected Discharge Plan and Services           Expected Discharge Date: 08/26/20                                     Social Determinants of Health (SDOH) Interventions    Readmission Risk Interventions No flowsheet data found.

## 2020-08-26 NOTE — Discharge Summary (Signed)
  Physician Discharge Summary  Patient ID: [name] MRN: [id number] DOB/AGE: 59/29/1962 59 y.o.  Admit date: 08/21/2020 Discharge date: 08/26/2020  Admission Diagnoses:  Pseudoarthrosis, radiculopathy   Discharge Diagnoses:  Pseudoarthrosis, radiculopathy  Active Problems:   * No active hospital problems. *   Discharged Condition: Stable  Hospital Course:  [name] is a 60 y.o. female who underwent elective posterior L3-4, L4-5 instrumented fusion and decompression.  Postoperatively, she was admitted to the spine unit.  On POD#1, she was ambulating without difficulty with pain well-controlled.  She was voiding without difficulty.  She was deemed ready for d/c home.  Treatments: Surgery - Bilateral laminectomies decompression of L3 and L4 specifically decompressing the L3 and L4 nerve roots.  Posterior lateral arthrodesis with local autograft L3-L4.  Posterior fixation L3-L5 with revision of pedicle fixation from L4-5 to include L3-L4 and L5 segmentally.  Discharge Exam: There were no vitals taken for this visit. Awake, alert, oriented Speech fluent, appropriate CN grossly intact 5/5 BUE/BLE Wound c/d/i  Disposition:  There are no questions and answers to display.            Signed: Vallarie Mare 08/26/2020, 1:18 PM

## 2020-08-26 NOTE — Discharge Instructions (Signed)
Wound Care Remove outer dressing dressing in 2-3 days Leave incision open to air. You may shower. Do not scrub directly on incision.  Do not put any creams, lotions, or ointments on incision. Activity Walk each and every day, increasing distance each day. No lifting greater than 5 lbs.  Avoid bending, arching, and twisting. No driving for 2 weeks; may ride as a passenger locally. If provided with back brace, wear when out of bed.  It is not necessary to wear in bed. Diet Resume your normal diet.  Return to Work Will be discussed at you follow up appointment. Call Your Doctor If Any of These Occur Redness, drainage, or swelling at the wound.  Temperature greater than 101 degrees. Severe pain not relieved by pain medication. Incision starts to come apart. Follow Up Appt Call today for appointment in 2-3 weeks ([phone]) or for problems.  If you have any hardware placed in your spine, you will need an x-ray before your appointment.

## 2020-08-31 NOTE — Progress Notes (Signed)
CM received message that patients insurance company has called to inquire about patients Home health not being set up. CM called patient reminded patient of CM attempting to set up home health prior to discharge but patient did not want to wait and asked that the CM call her. CM was unable to get [facility] services and wanted to speak with patient about ambulatory HH. CM told patient that CM  Made several  attempts to call patient but was unable to reach patient. Patient states that she does not remember  And admits to being a little out of it for the past several days. CM informed patient that ambulatory HH has been set up and the officed will call patient but if patient is requesting other services she would need to contact office. Patient verbalized understanding. TOC will sign off

## 2020-12-23 ENCOUNTER — Other Ambulatory Visit: Payer: Self-pay | Admitting: Obstetrics & Gynecology

## 2020-12-30 DIAGNOSIS — M5412 Radiculopathy, cervical region: Secondary | ICD-10-CM | POA: Insufficient documentation

## 2021-01-21 ENCOUNTER — Ambulatory Visit: Payer: Medicare Other | Admitting: Orthopedic Surgery

## 2021-02-08 ENCOUNTER — Other Ambulatory Visit: Payer: Self-pay

## 2021-02-08 ENCOUNTER — Ambulatory Visit: Payer: Medicare Other

## 2021-02-08 ENCOUNTER — Ambulatory Visit (INDEPENDENT_AMBULATORY_CARE_PROVIDER_SITE_OTHER): Payer: Medicare Other | Admitting: Orthopedic Surgery

## 2021-02-08 VITALS — BP 126/64 | HR 77 | Ht 62.0 in | Wt 258.0 lb

## 2021-02-08 DIAGNOSIS — M5410 Radiculopathy, site unspecified: Secondary | ICD-10-CM | POA: Diagnosis not present

## 2021-02-08 DIAGNOSIS — M170 Bilateral primary osteoarthritis of knee: Secondary | ICD-10-CM

## 2021-02-08 DIAGNOSIS — M25551 Pain in right hip: Secondary | ICD-10-CM

## 2021-02-08 DIAGNOSIS — M25552 Pain in left hip: Secondary | ICD-10-CM

## 2021-02-08 DIAGNOSIS — Z6841 Body Mass Index (BMI) 40.0 and over, adult: Secondary | ICD-10-CM

## 2021-02-08 MED ORDER — MELOXICAM 7.5 MG PO TABS: 7.5000 mg | ORAL_TABLET | Freq: Every day | ORAL | 5 refills | Status: DC

## 2021-02-08 NOTE — Progress Notes (Addendum)
Chief Complaint  Patient presents with   Back Pain    States Bilateral hip pain but points to back    Knee Pain    Both    R shoulder has had recent lumbar fusion this year comes in complaining of bilateral hip pain bilateral thigh pain knee pain leg pain and foot pain  She also complains of bilateral knee pain she has had a previous arthroscopy on the right and left in 2020 this bothers her as well it causes her to have trouble getting up but she came in today specifically complaining of bilateral buttock pain and radiation into her feet bilaterally  She saw Dr. Ellene Route he says he thinks it is her hips and not coming from her back  X-ray of the pelvis show normal AP and lateral of both hips  She has known arthritis in both knees  Body mass index is 47.19 kg/m.  A:   60 year old female status post recent lumbar fusion with bilateral radicular leg pain has underlying arthritis as well in both knees  Pain is currently predominantly lumbar related  Plan:    Weight loss  I recommend injection both knees and return to Dr. Ellene Route for the bilateral radicular leg pain  Start meloxicam and inject both knees   Meds ordered this encounter  Medications   meloxicam (MOBIC) 7.5 MG tablet    Sig: Take 1 tablet (7.5 mg total) by mouth daily.    Dispense:  30 tablet    Refill:  5  Procedure note  Injection  Verbal consent was obtained to inject the right knee  Timeout procedure was completed to confirm injection site  Diagnosis osteoarthritis right knee with chronic pain  Medications used 6 mg Celestone Lidocaine 1% plain 3 cc  Anesthesia was provided by ethyl chloride spray  Prep was performed with alcohol  Technique of injection the knee was in flexion we used a lateral approach to the lateral inferior knee portal  No complications were noted   Procedure note  Injection  Verbal consent was obtained to inject the left knee  Timeout procedure was completed to  confirm injection site  Diagnosis osteoarthritis chronic pain left knee  Medications used Celestone 6 mg Lidocaine 1% plain 3 cc  Anesthesia was provided by ethyl chloride spray  Prep was performed with alcohol  Technique of injection the knee was in flexion we used a lateral approach and injected through the lateral inferior portal  No complications were noted

## 2021-02-08 NOTE — Patient Instructions (Addendum)
Stop the naproxen   Start meloxicam

## 2021-03-17 ENCOUNTER — Emergency Department (HOSPITAL_COMMUNITY): Payer: Medicare Other

## 2021-03-17 ENCOUNTER — Emergency Department (HOSPITAL_COMMUNITY)
Admission: EM | Admit: 2021-03-17 | Discharge: 2021-03-17 | Disposition: A | Discharge status: Home / Self Care | Payer: Medicare Other | Attending: Emergency Medicine | Admitting: Emergency Medicine

## 2021-03-17 ENCOUNTER — Other Ambulatory Visit: Payer: Self-pay

## 2021-03-17 ENCOUNTER — Encounter (HOSPITAL_COMMUNITY): Payer: Self-pay

## 2021-03-17 DIAGNOSIS — S82832A Other fracture of upper and lower end of left fibula, initial encounter for closed fracture: Secondary | ICD-10-CM | POA: Insufficient documentation

## 2021-03-17 DIAGNOSIS — Y92009 Unspecified place in unspecified non-institutional (private) residence as the place of occurrence of the external cause: Secondary | ICD-10-CM | POA: Insufficient documentation

## 2021-03-17 DIAGNOSIS — R519 Headache, unspecified: Secondary | ICD-10-CM | POA: Diagnosis not present

## 2021-03-17 DIAGNOSIS — J45909 Unspecified asthma, uncomplicated: Secondary | ICD-10-CM | POA: Insufficient documentation

## 2021-03-17 DIAGNOSIS — W19XXXA Unspecified fall, initial encounter: Secondary | ICD-10-CM | POA: Insufficient documentation

## 2021-03-17 DIAGNOSIS — Z87891 Personal history of nicotine dependence: Secondary | ICD-10-CM | POA: Insufficient documentation

## 2021-03-17 DIAGNOSIS — Z8541 Personal history of malignant neoplasm of cervix uteri: Secondary | ICD-10-CM | POA: Diagnosis not present

## 2021-03-17 DIAGNOSIS — S8992XA Unspecified injury of left lower leg, initial encounter: Secondary | ICD-10-CM | POA: Diagnosis present

## 2021-03-17 DIAGNOSIS — Y9301 Activity, walking, marching and hiking: Secondary | ICD-10-CM | POA: Insufficient documentation

## 2021-03-17 MED ORDER — OXYCODONE-ACETAMINOPHEN 5-325 MG PO TABS: 1.0000 | ORAL_TABLET | Freq: Three times a day (TID) | ORAL | 0 refills | Status: AC | PRN

## 2021-03-17 NOTE — ED Triage Notes (Signed)
Pt presents to ED ambulatory after fall on Sunday and again this am. Pt c/o lower back pain, right hip pain, left knee pain, and left ankle pain. Pt denies LOC or hitting head.

## 2021-03-17 NOTE — Discharge Instructions (Addendum)
Appears that you have broken fibula that is  causing  left knee pain.  Given you a  brace please wear during the day you may take off at nighttime.  I like you to be nonweightbearing, please use a crutch or cane as needed.I have given you a short course of narcotics please take as prescribed.  This medication can make you drowsy do not consume alcohol or operate heavy machinery when taking this medication.  This medication is Tylenol in it do not take Tylenol and take this medication.     Please follow-up with orthopedic surgery for further evaluation.  Come back to the emergency department if you develop chest pain, shortness of breath, severe abdominal pain, uncontrolled nausea, vomiting, diarrhea.

## 2021-03-17 NOTE — ED Provider Notes (Signed)
[facility] Provider Note   CSN: [id number] Arrival date & time: 03/17/21  [id number]     History Chief Complaint  Patient presents with   [name]    TANG RATHJE is a 60 y.o. female.  HPI  Patient with significant medical history of anxiety, arthritis, cervical cancer, presents to the emergency department with chief complaint of a fall.  Patient states on Sunday she had a mechanical fall, was walking in her house her left leg became weak due to the arthritis and fall onto her right side, she denies hitting her head, losing conscious, is not on anticoagulants.  Patient states she was able to get herself up off the floor, and get back into bed.  She states she had some pain in her right hip but felt okay.  She then had another fall yesterday, she states that she was walking had pain in her right hip causing her to fall onto her left knee, she again denies hitting her head, losing conscious.  She states that she is having pain in her left knee, left ankle, right hip, lower back states she has had this headache for a while, is not associated with change in vision, paresthesia,weakness in the upper /lower extremities, she denies neck pain but does have some lower back pain, denies pain rating to her lower extremities, denies urinary incontinency, retention, difficulty with bowel movements, has no other complaints at this time.  She does not endorse chest pain, shortness of breath, abdominal pain, worsening pedal edema.  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   Lumbar stenosis 11/05/2019   S/P left knee arthroscopy 06/27/2019 07/01/2019   Derangement of posterior horn of medial meniscus of left knee    Primary osteoarthritis of left knee    S/P  right knee arthroscopy 05/09/19 05/29/2019   Depression 07/24/2014   LOWER LEG, ARTHRITIS, DEGEN./OSTEO 02/18/2009   DERANGEMENT MENISCUS 02/18/2009   ANSERINE BURSITIS, RIGHT 02/18/2009    Past Surgical History:  Procedure Laterality Date   ABDOMINAL HYSTERECTOMY     ANTERIOR LAT LUMBAR FUSION N/A 11/05/2019   Procedure: Lumbar Four-Five Anterolateral lumbar interbody fusion with lateral plate;  Surgeon: Kristeen Miss, MD;  Location: [facility];  Service: Neurosurgery;  Laterality: N/A;  anterolateral   BACK SURGERY     pins   BACK SURGERY  2010   BLADDER REPAIR     CESAREAN SECTION  1982, 1984   COLONOSCOPY N/A 06/12/2013   Procedure: COLONOSCOPY;  Surgeon: Rogene Houston, MD;  [facility];  Service: Endoscopy;  Laterality: N/A;  930-moved to 1020 Ann to notify pt   KNEE ARTHROSCOPY WITH MEDIAL MENISECTOMY Right 05/09/2019   Procedure: KNEE ARTHROSCOPY WITH MEDIAL MENISECTOMY AND LATERAL MENISECTOMY;  Surgeon: Carole Civil, MD;  Location: [facility];  Service: Orthopedics;  Laterality: Right;   KNEE ARTHROSCOPY WITH MEDIAL MENISECTOMY Left 06/27/2019   Procedure: KNEE ARTHROSCOPY WITH MEDIAL MENISCECTOMY;  Surgeon: Carole Civil, MD;  Location: [facility];  Service: Orthopedics;  Laterality: Left;     OB History     Gravida  2   Para      Term  Preterm      AB      Living  2      SAB      IAB      Ectopic      Multiple      Live Births              Family History  Problem Relation Age of Onset   Colon cancer Other     Social History   Tobacco Use   Smoking status: Former    Packs/day: 1.00    Years: 20.00    Pack years: 20.00    Types: Cigarettes   Smokeless tobacco: Never   Tobacco comments:    trying to quit  Vaping Use   Vaping Use: Some days  Substance Use Topics   Alcohol use: No    Alcohol/week: 0.0 standard drinks   Drug use: No    Home Medications Prior to Admission medications   Medication Sig Start Date End  Date Taking? Authorizing Provider  oxyCODONE-acetaminophen (PERCOCET/ROXICET) 5-325 MG tablet Take 1 tablet by mouth every 8 (eight) hours as needed for up to 4 days for severe pain. 03/17/21 03/21/21 Yes Marcello Fennel, PA-C  acetaminophen (TYLENOL) 500 MG tablet Take 1,000 mg by mouth every 6 (six) hours as needed for moderate pain.    [provider]  ARIPiprazole (ABILIFY) 2 MG tablet Take 2 mg by mouth daily.    [provider]  Cholecalciferol (VITAMIN D3) 50 MCG (2000 UT) capsule Take 2,000 Units by mouth daily.     [provider]  EPINEPHrine (PRIMATENE MIST IN) Inhale 1 puff into the lungs daily as needed (shortness of breath).    [provider]  estradiol (ESTRACE) 2 MG tablet TAKE ONE TABLET AT BEDTIME 12/23/20   Florian Buff, MD  FLUoxetine (PROZAC) 40 MG capsule Take 40 mg by mouth daily.    [provider]  gabapentin (NEURONTIN) 300 MG capsule Take 1 capsule (300 mg total) by mouth 3 (three) times daily. 08/16/19   Carole Civil, MD  loratadine (CLARITIN) 10 MG tablet Take 10 mg by mouth daily.    [provider]  meloxicam (MOBIC) 7.5 MG tablet Take 1 tablet (7.5 mg total) by mouth daily. 02/08/21   Carole Civil, MD  metFORMIN (GLUCOPHAGE) 500 MG tablet Take 500 mg by mouth 2 (two) times daily with a meal.    [provider]  methocarbamol (ROBAXIN) 500 MG tablet Take 1 tablet (500 mg total) by mouth every 6 (six) hours as needed for muscle spasms. 08/26/20   Vallarie Mare, MD  Multiple Vitamin (MULTIVITAMIN WITH MINERALS) TABS tablet Take 1 tablet by mouth daily.    [provider]  naproxen sodium (ALEVE) 220 MG tablet Take 220 mg by mouth daily as needed (pain).    [provider]  oxyCODONE (OXY IR/ROXICODONE) 5 MG immediate release tablet Take 1 tablet (5 mg total) by mouth every 4 (four) hours as needed for severe pain. Patient taking differently: Take 5-10 mg by mouth every 6  (six) hours as needed for severe pain. 05/09/19   Carole Civil, MD  oxyCODONE (OXY IR/ROXICODONE) 5 MG immediate release tablet Take 1-2 tablets (5-10 mg total) by mouth every 3 (three) hours as needed for moderate pain or severe pain ((score 7 to 10)). 08/26/20   Vallarie Mare, MD  pravastatin (PRAVACHOL) 40 MG tablet Take 40 mg by mouth at bedtime.  [provider]  traZODone (DESYREL) 100 MG tablet Take 200 mg by mouth at bedtime.    [provider]    Allergies    Patient has no known allergies.  Review of Systems   Review of Systems  Constitutional:  Negative for chills and fever.  HENT:  Negative for congestion.   Respiratory:  Negative for shortness of breath.   Cardiovascular:  Negative for chest pain.  Gastrointestinal:  Negative for abdominal pain, diarrhea, nausea and vomiting.  Genitourinary:  Negative for enuresis.  Musculoskeletal:  Positive for back pain. Negative for neck pain.       Left knee, left ankle, right hip, lower back pain.  Skin:  Negative for rash.  Neurological:  Positive for headaches. Negative for dizziness, weakness and numbness.  Hematological:  Does not bruise/bleed easily.   Physical Exam Updated Vital Signs BP 124/65 (BP Location: Right Arm)   Pulse 74   Temp 97.8 F (36.6 C)   Resp 20   Ht '5\' 2"'$  (1.575 m)   SpO2 92%   BMI 47.19 kg/m   Physical Exam Vitals and nursing note reviewed.  Constitutional:      General: She is not in acute distress.    Appearance: She is not ill-appearing.  HENT:     Head: Normocephalic and atraumatic.     Comments: No deformities of the head present, no raccoon eyes or battle sign noted.    Nose: No congestion.  Eyes:     Extraocular Movements: Extraocular movements intact.     Conjunctiva/sclera: Conjunctivae normal.     Pupils: Pupils are equal, round, and reactive to light.  Cardiovascular:     Rate and Rhythm: Normal rate and regular rhythm.     Pulses: Normal pulses.      Heart sounds: No murmur heard.   No friction rub. No gallop.  Pulmonary:     Effort: No respiratory distress.     Breath sounds: No wheezing, rhonchi or rales.  Chest:     Chest wall: No tenderness.  Abdominal:     Palpations: Abdomen is soft.     Tenderness: There is no abdominal tenderness. There is no right CVA tenderness or left CVA tenderness.  Musculoskeletal:     Cervical back: No tenderness.     Comments: Spine was palpated she had tenderness to palpation her lumbar spine, no step-off deformities present.  Patient has full range of motion 5 of 5 strength in the upper and lower extremity.    Patient has  tenderness along the lateral aspect of the tibial plateau of the left knee no crepitus or deformities noted, no joint laxity present, patient has tenderness on the medial malleolus of the left ankle, no deformities present, no joint laxity noted.  Skin:    General: Skin is warm and dry.  Neurological:     Mental Status: She is alert.     Comments: No facial asymmetry, no difficulty with word finding, able to follow two-step commands, no unilateral weakness present on my exam.  Psychiatric:        Mood and Affect: Mood normal.    ED Results / Procedures / Treatments   Labs (all labs ordered are listed, but only abnormal results are displayed) Labs Reviewed - No data to display  EKG None  Radiology DG Ankle Complete Left  Result Date: 03/17/2021 CLINICAL DATA:  Falls, left ankle pain, initial encounter. EXAM: LEFT ANKLE COMPLETE - 3+ VIEW COMPARISON:  None. FINDINGS: No acute  osseous abnormality. Soft tissue swelling about the ankle joint. IMPRESSION: Soft tissue swelling without fracture. Electronically Signed   By: Lorin Picket M.D.   On: 03/17/2021 10:10   CT Head Wo Contrast  Result Date: 03/17/2021 CLINICAL DATA:  Headache for 1 week EXAM: CT HEAD WITHOUT CONTRAST TECHNIQUE: Contiguous axial images were obtained from the base of the skull through the vertex  without intravenous contrast. COMPARISON:  None. FINDINGS: Brain: There is no evidence of acute intracranial hemorrhage, extra-axial fluid collection, or infarct. The ventricles are not enlarged. There is no mass lesion. There is no midline shift. Vascular: No hyperdense vessel or unexpected calcification. Skull: Normal. Negative for fracture or focal lesion. Sinuses/Orbits: There is a small mucous retention cyst in the sphenoid sinus. The globes and orbits are unremarkable. Other: None. IMPRESSION: No acute intracranial pathology. Electronically Signed   By: Valetta Mole M.D.   On: 03/17/2021 14:51   CT Lumbar Spine Wo Contrast  Result Date: 03/17/2021 CLINICAL DATA:  Low back pain, trauma EXAM: CT LUMBAR SPINE WITHOUT CONTRAST TECHNIQUE: Multidetector CT imaging of the lumbar spine was performed without intravenous contrast administration. Multiplanar CT image reconstructions were also generated. COMPARISON:  December 2021 FINDINGS: Segmentation: Numbering is kept consistent with the prior study with a partially lumbarized S1. Alignment: Stable including anterolisthesis at L4-L5. Vertebrae: Postoperative changes are again identified at L4-S1. No evidence of hardware complication. There is associated streak artifact. Stable vertebral body heights. No acute fracture. Paraspinal and other soft tissues: No acute abnormality. Disc levels: Appearance is similar to the prior study. Small disc bulges and mild facet hypertrophy are present with ligamentum flavum thickening. There is no high-grade canal or foraminal stenosis identified. Canal is obscured at the operative levels. IMPRESSION: No acute fracture. Electronically Signed   By: Macy Mis M.D.   On: 03/17/2021 14:37   DG Knee Complete 4 Views Left  Result Date: 03/17/2021 CLINICAL DATA:  Left knee pain. Fell 4 days ago. EXAM: LEFT KNEE - COMPLETE 4+ VIEW COMPARISON:  06/25/2020 FINDINGS: Stable appearing tricompartmental degenerative changes with joint  space narrowing and osteophytic spurring most notable in the medial compartment. Possible nondisplaced fracture involving the fibular head. Recommend correlation with pain and tenderness over this area. No other significant bony findings. IMPRESSION: 1. Stable tricompartmental degenerative changes. 2. Possible nondisplaced fracture involving the fibular head. Electronically Signed   By: Marijo Sanes M.D.   On: 03/17/2021 10:14   DG Hip Unilat W or Wo Pelvis 2-3 Views Right  Result Date: 03/17/2021 CLINICAL DATA:  Fall EXAM: DG HIP (WITH OR WITHOUT PELVIS) 2-3V RIGHT COMPARISON:  Pelvis radiographs 02/08/2021 FINDINGS: There is no acute fracture or dislocation. Femoroacetabular alignment is normal. There is mild degenerative change about both hips. Lower lumbar spine fusion hardware and pelvic surgical clips are again noted. The SI joints and symphysis pubis are intact. IMPRESSION: No acute fracture or dislocation. Electronically Signed   By: Valetta Mole M.D.   On: 03/17/2021 10:10    Procedures Procedures   Medications Ordered in ED Medications - No data to display  ED Course  I have reviewed the triage vital signs and the nursing notes.  Pertinent labs & imaging results that were available during my care of the patient were reviewed by me and considered in my medical decision making (see chart for details).    MDM Rules/Calculators/A&P  Initial impression-patient presents after a fall, she is alert, does not appear in acute stress, vital signs reassuring.  She is obtained imaging of the left knee and ankle, as well as the right hip, will also add on CT head as well as lumbar spine for further evaluation.  Work-up-DG of right hip negative for acute findings, left knee reveals stable tricompartment degeneration changes, possible nondisplaced fracture involving the fibular head, DG of left ankle negative for acute findings.  CT lumbar spine CT negative for acute  findings.  CT head negative for acute findings.  Rule out- low suspicion for intracranial head bleed as patient denies loss of conscious, is not on anticoagulant, she does not endorse headaches, paresthesia/weakness in the upper and lower extremities, no focal deficits present on my exam.  CT imaging negative for acute findings.  Low suspicion for spinal cord abnormality or spinal fracture spine was palpated was nontender to palpation, patient has full range of motion in the upper and lower extremities imaging of the lumbar spine negative for acute findings.  Low suspicion for pneumothorax as lung sounds are clear bilaterally, chest was nontender to palpation, will defer imaging at this time.  Low suspicion for intra-abdominal trauma as abdomen soft nontender to palpation.  Low suspicion for orthopedic injury of the right hip, left ankle as imaging is negative for acute findings.   Plan-  Left knee pain-likely secondary due to fracture of the femoral head, will place her in a brace, make her nonweightbearing, follow-up with orthopedic surgery for further evaluation.  Vital signs have remained stable, no indication for hospital admission.    Patient given at home care as well strict return precautions.  Patient verbalized that they understood agreed to said plan.  Final Clinical Impression(s) / ED Diagnoses Final diagnoses:  Fall in home, initial encounter  Closed fracture of proximal end of left fibula, unspecified fracture morphology, initial encounter    Rx / DC Orders ED Discharge Orders          Ordered    oxyCODONE-acetaminophen (PERCOCET/ROXICET) 5-325 MG tablet  Every 8 hours PRN        03/17/21 1507             Marcello Fennel, PA-C 03/17/21 1510    Milton Ferguson, MD 03/20/21 1244

## 2021-03-17 NOTE — ED Notes (Signed)
Pt to CT at this time.

## 2021-03-25 ENCOUNTER — Ambulatory Visit (INDEPENDENT_AMBULATORY_CARE_PROVIDER_SITE_OTHER): Payer: Medicare Other | Admitting: Orthopedic Surgery

## 2021-03-25 ENCOUNTER — Other Ambulatory Visit: Payer: Self-pay

## 2021-03-25 ENCOUNTER — Encounter: Payer: Self-pay | Admitting: Orthopedic Surgery

## 2021-03-25 VITALS — BP 129/67 | HR 77 | Ht 62.0 in | Wt 258.0 lb

## 2021-03-25 DIAGNOSIS — S82832A Other fracture of upper and lower end of left fibula, initial encounter for closed fracture: Secondary | ICD-10-CM

## 2021-03-25 NOTE — Progress Notes (Signed)
Chief Complaint  Patient presents with   Fracture    Lt fib DOI 03/14/21    60 year old female got up on 4 September her leg gave way she fell onto her left knee she sustained a proximal fibular fracture no other injuries.  She went to the emergency room for x-rays diagnosing the injury she is wearing a knee immobilizer but does not fit  She has chronic weakness in both lower extremities uses a walker prior to the injury  Exam she was brought in in a wheelchair she is having trouble standing on bearing weight she was put on the exam table she is moderately obese she is awake and alert she is oriented x3 her leg is's compartments are soft she has good distal perfusion normal sensation her collateral ligaments are stable there is no ACL or PCL injury she is tender over the proximal fibula she has some bruising along the skin of the left knee and leg  The x-rays are from the hospital I see a proximal fibular nondisplaced fracture no other injuries there is some mild degenerative change  Recommend weight-bear as tolerated in a walker  Expect 6 weeks to heal recommend x-ray in 5 weeks left knee

## 2021-04-13 ENCOUNTER — Other Ambulatory Visit (HOSPITAL_COMMUNITY): Payer: Self-pay | Admitting: Nurse Practitioner

## 2021-04-13 DIAGNOSIS — Z1231 Encounter for screening mammogram for malignant neoplasm of breast: Secondary | ICD-10-CM

## 2021-04-19 ENCOUNTER — Other Ambulatory Visit: Payer: Self-pay

## 2021-04-19 ENCOUNTER — Ambulatory Visit (HOSPITAL_COMMUNITY)
Admission: RE | Admit: 2021-04-19 | Discharge: 2021-04-19 | Disposition: A | Discharge status: Home / Self Care | Payer: Medicare Other | Source: Ambulatory Visit | Attending: Adult Health | Admitting: Adult Health

## 2021-04-19 ENCOUNTER — Other Ambulatory Visit (HOSPITAL_COMMUNITY): Payer: Self-pay | Admitting: Adult Health

## 2021-04-19 ENCOUNTER — Ambulatory Visit (HOSPITAL_COMMUNITY)
Admission: RE | Admit: 2021-04-19 | Discharge: 2021-04-19 | Disposition: A | Discharge status: Home / Self Care | Payer: Medicare Other | Source: Ambulatory Visit | Attending: Nurse Practitioner | Admitting: Nurse Practitioner

## 2021-04-19 DIAGNOSIS — R059 Cough, unspecified: Secondary | ICD-10-CM | POA: Insufficient documentation

## 2021-04-19 DIAGNOSIS — Z1231 Encounter for screening mammogram for malignant neoplasm of breast: Secondary | ICD-10-CM | POA: Insufficient documentation

## 2021-04-25 IMAGING — DX RIGHT KNEE - 1-2 VIEW
2 series · 2 of 2 positions shown · non-contrast
Comparison: Right knee radiograph 07/15/2013

CLINICAL DATA: Acute pain of right knee. Patient reports bilateral
knee pain and stiffness for months.

EXAM:
RIGHT KNEE - 1-2 VIEW

[knee ap]
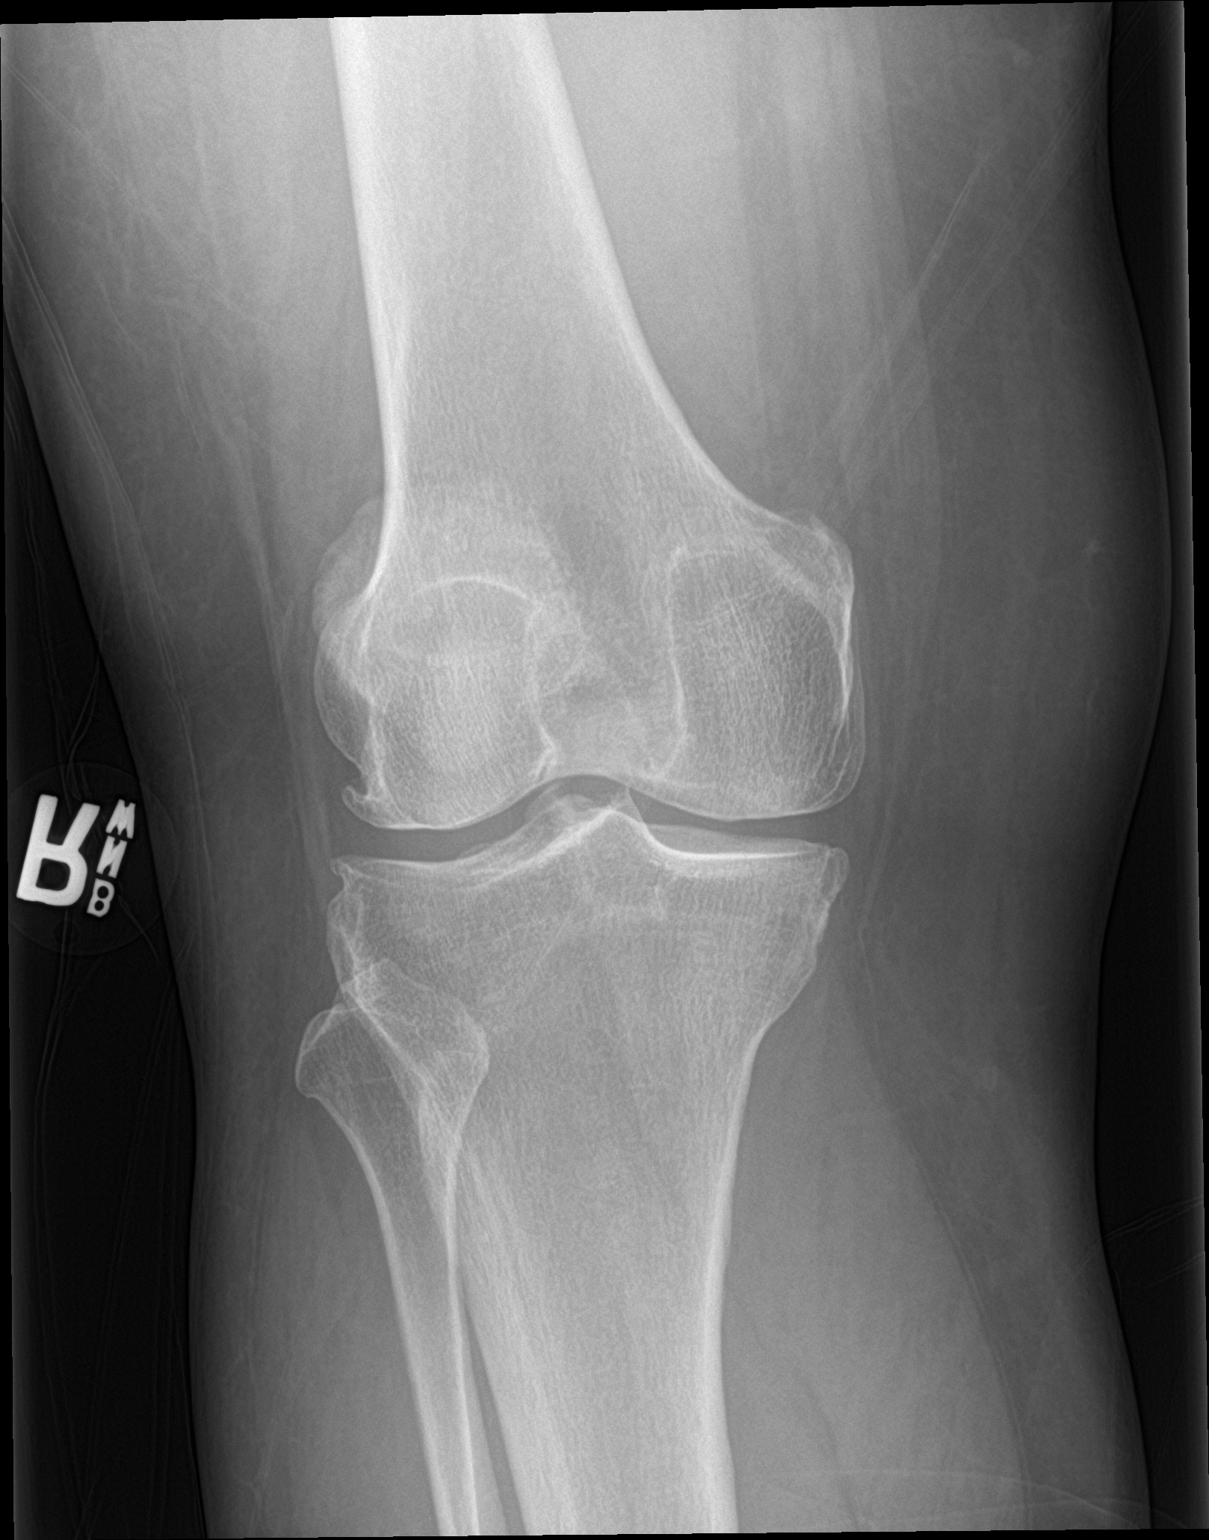

[knee lat]
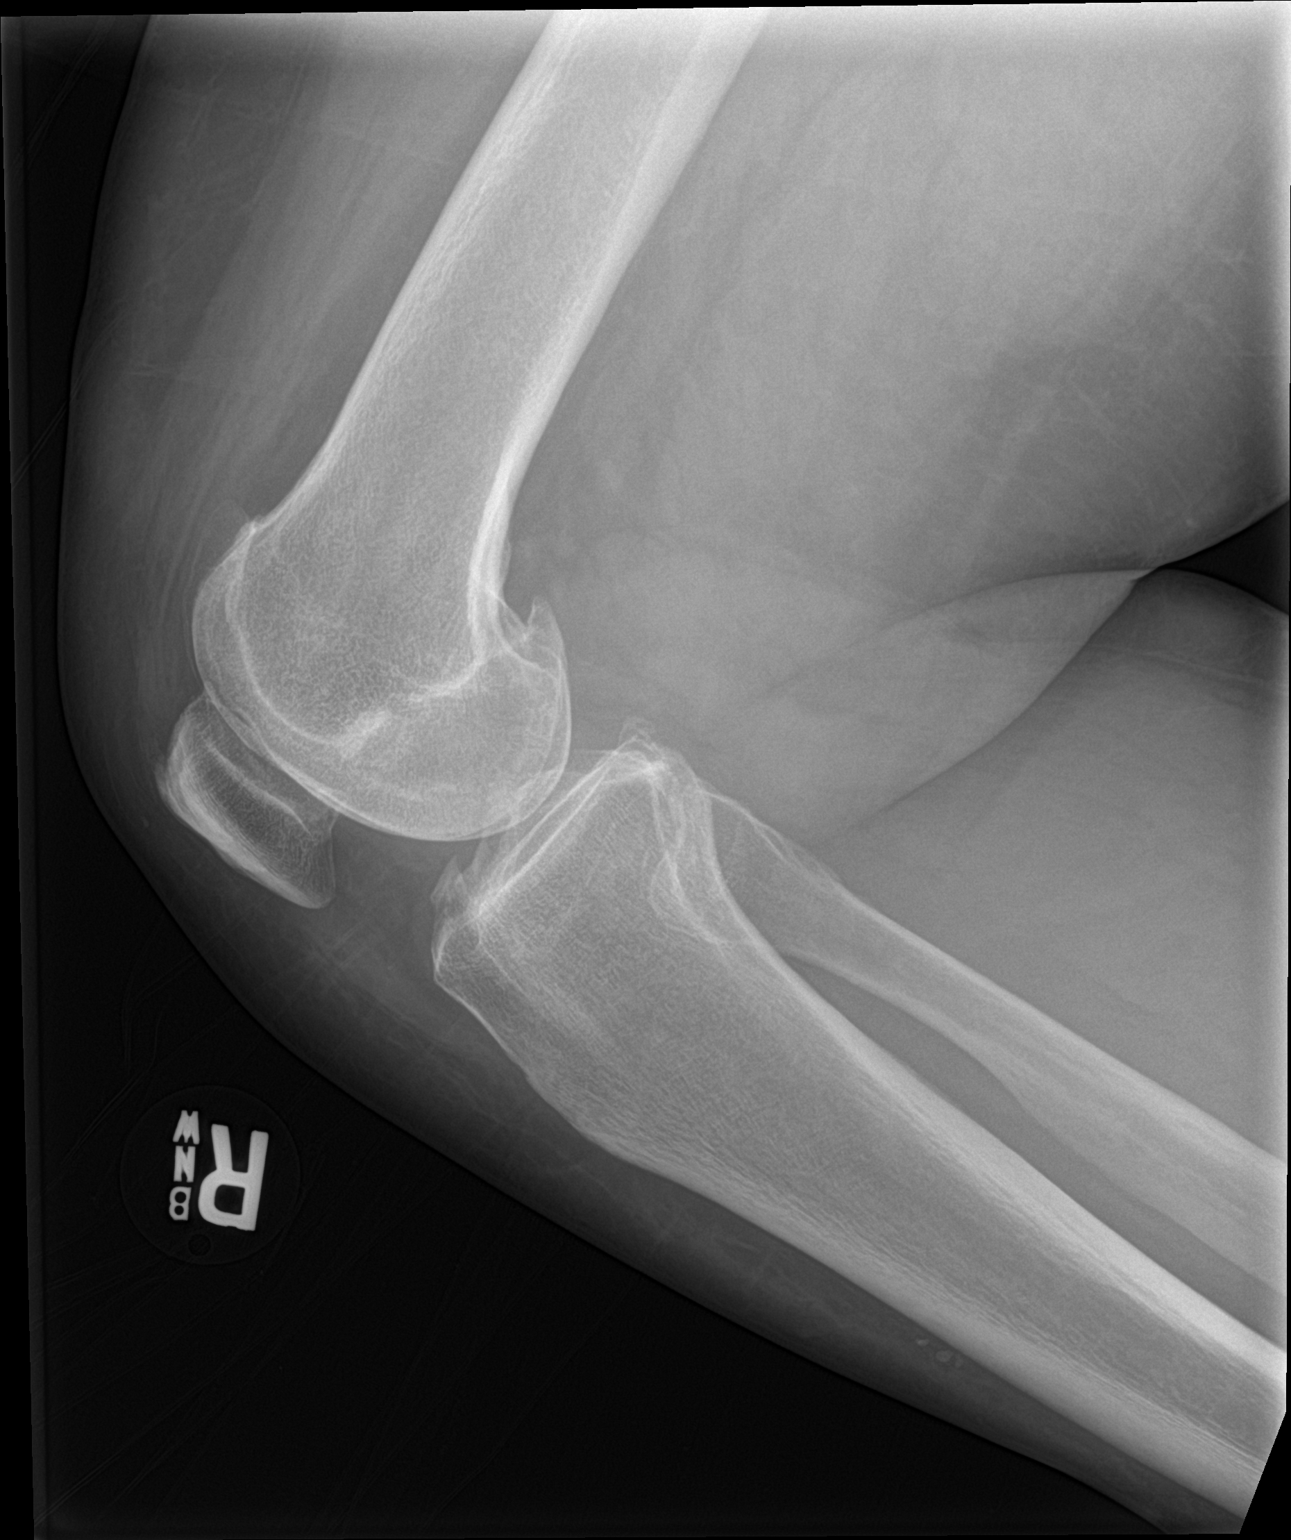

[2 of 2 positions shown; findings below may reference images not displayed]

FINDINGS: Mild-to-moderate tricompartmental osteophytes, progressed from prior
exam. No significant joint space narrowing. No significant joint
effusion. No fracture, dislocation, or focal bone abnormality. Tiny
quadriceps tendon enthesophyte.
IMPRESSION: Mild-to-moderate tricompartmental osteoarthritis, progressed from

## 2021-04-29 ENCOUNTER — Other Ambulatory Visit: Payer: Self-pay

## 2021-04-29 ENCOUNTER — Other Ambulatory Visit: Payer: Self-pay | Admitting: Orthopedic Surgery

## 2021-04-29 ENCOUNTER — Ambulatory Visit: Payer: Medicare Other

## 2021-04-29 ENCOUNTER — Ambulatory Visit (INDEPENDENT_AMBULATORY_CARE_PROVIDER_SITE_OTHER): Payer: Medicare Other | Admitting: Orthopedic Surgery

## 2021-04-29 ENCOUNTER — Encounter: Payer: Self-pay | Admitting: Orthopedic Surgery

## 2021-04-29 VITALS — BP 154/78 | HR 79 | Ht 64.0 in | Wt 265.0 lb

## 2021-04-29 DIAGNOSIS — Z6841 Body Mass Index (BMI) 40.0 and over, adult: Secondary | ICD-10-CM

## 2021-04-29 DIAGNOSIS — Z9889 Other specified postprocedural states: Secondary | ICD-10-CM

## 2021-04-29 DIAGNOSIS — S82832D Other fracture of upper and lower end of left fibula, subsequent encounter for closed fracture with routine healing: Secondary | ICD-10-CM

## 2021-04-29 DIAGNOSIS — M25561 Pain in right knee: Secondary | ICD-10-CM

## 2021-04-29 DIAGNOSIS — M25562 Pain in left knee: Secondary | ICD-10-CM

## 2021-04-29 DIAGNOSIS — G8929 Other chronic pain: Secondary | ICD-10-CM

## 2021-04-29 DIAGNOSIS — M170 Bilateral primary osteoarthritis of knee: Secondary | ICD-10-CM

## 2021-04-29 NOTE — Patient Instructions (Signed)
Return when u lose 60 lbs   Talk to your doctor about weight loss drugs

## 2021-04-29 NOTE — Progress Notes (Signed)
Chief Complaint  Patient presents with   Knee Pain    Bilateral getting worse states cant even go to grocery store pain severe. Fracture left prox fibula 03/14/21    Encounter Diagnoses  Name Primary?   Other closed fracture of proximal end of left fibula with routine healing, subsequent encounter Yes   Chronic pain of right knee    Chronic pain of left knee    Body mass index is 45.49 kg/m.  PMD [place]   Follow-up appointment  Patient is a proximal left fibula fracture secondary to a fall  Here for x-rays to check on the healing of the fracture  [name] IS C/O BILATERAL KNEE PAIN MEDIAL SIDE   HARD TO WALK AND SHOP   Physical Exam Constitutional:      Appearance: She is obese.  Musculoskeletal:     Right knee: Deformity and bony tenderness present. No swelling, effusion, erythema or crepitus. Normal range of motion. Tenderness present over the medial joint line. Abnormal alignment.     Instability Tests: Anterior drawer test negative. Posterior drawer test negative.     Left knee: Deformity and bony tenderness present. No swelling, effusion, erythema or crepitus. Normal range of motion. Tenderness present over the medial joint line. Abnormal alignment.     Instability Tests: Anterior drawer test negative. Posterior drawer test negative.     Comments: MEDIAL JOINT LINE TENDERNESS BOTH KNEES   MILD VARUS BOTH KNEES   ROM 120 + BOTH KNEES  Skin:    General: Skin is warm and dry.     Capillary Refill: Capillary refill takes less than 2 seconds.  Neurological:     Mental Status: She is alert and oriented to person, place, and time.     Gait: Gait abnormal.  Psychiatric:        Mood and Affect: Mood normal.        Behavior: Behavior normal.        Thought Content: Thought content normal.        Judgment: Judgment normal.   THE FIB FRX HEALED   THE KNEE ARTHRITIS IS WORSE   SHES OVERWEIGHT   SHE NEEDS REPALCEMENTS   AS A TEMP MEASURE INJECT BOTH KNEES    Procedure note  Injection  Verbal consent was obtained to inject the  LEFT KNEE   Timeout procedure was completed to confirm injection site  Diagnosis OA LEFT KNEE   Medications used CELESTONE Lidocaine 1% plain 3 cc  Anesthesia was provided by ethyl chloride spray  Prep was performed with alcohol  Technique of injection   KNEE 90 FLEXION LATERAL PORTAL   No complications were noted  Procedure note  Injection  Verbal consent was obtained to inject the  RIGHT KNEE   Timeout procedure was completed to confirm injection site  Diagnosis OA RT KNEE   Medications used CELESTONE  Lidocaine 1% plain 3 cc  Anesthesia was provided by ethyl chloride spray  Prep was performed with alcohol  Technique of injection  KNEE FLEXION 90, INJECT THRU LATERAL PORTAL    No complications were noted

## 2021-05-04 ENCOUNTER — Ambulatory Visit (HOSPITAL_COMMUNITY)
Admission: RE | Admit: 2021-05-04 | Discharge: 2021-05-04 | Disposition: A | Discharge status: Home / Self Care | Payer: Medicare Other | Source: Ambulatory Visit | Attending: Family Medicine | Admitting: Family Medicine

## 2021-05-04 ENCOUNTER — Other Ambulatory Visit: Payer: Self-pay

## 2021-05-04 DIAGNOSIS — Z136 Encounter for screening for cardiovascular disorders: Secondary | ICD-10-CM | POA: Insufficient documentation

## 2021-05-20 ENCOUNTER — Encounter (INDEPENDENT_AMBULATORY_CARE_PROVIDER_SITE_OTHER): Payer: Self-pay | Admitting: *Deleted

## 2021-06-11 ENCOUNTER — Telehealth: Payer: Self-pay | Admitting: Orthopedic Surgery

## 2021-06-11 NOTE — Telephone Encounter (Signed)
Patient called regarding left knee and lower leg, pain and swelling; asked for immediate appointment with Dr Aline Brochure.  Per office visit with Xrays 04/29/21, visit summary shows: "Return when u lose 60 lbs  Talk to your doctor about weight loss drugs " - please review and advise.

## 2021-06-14 NOTE — Telephone Encounter (Signed)
Done

## 2021-06-16 ENCOUNTER — Other Ambulatory Visit: Payer: Self-pay

## 2021-06-16 ENCOUNTER — Ambulatory Visit (INDEPENDENT_AMBULATORY_CARE_PROVIDER_SITE_OTHER): Payer: Medicare Other | Admitting: Orthopedic Surgery

## 2021-06-16 ENCOUNTER — Encounter: Payer: Self-pay | Admitting: Orthopedic Surgery

## 2021-06-16 DIAGNOSIS — M5410 Radiculopathy, site unspecified: Secondary | ICD-10-CM

## 2021-06-16 DIAGNOSIS — M1712 Unilateral primary osteoarthritis, left knee: Secondary | ICD-10-CM

## 2021-06-16 NOTE — Patient Instructions (Signed)
You have been referred to [facility] on [address] in [place], they will call you with appointment. If you have not heard anything in one week call them to schedule [phone]   

## 2021-06-16 NOTE — Progress Notes (Signed)
Chief Complaint  Patient presents with   Knee Pain    Bilateral left is worse     [name] comes in with extensive knee pain today.  She has tried several things including ice heat oxycodone she is on a walker she says it is hard to walk  The pain is diffuse throughout the knee and she actually points to the medial side of her leg and knee starting up around the mid thigh down across the mid tibia  She is status post lumbar fusion she had a CT back in September or October showing a stable fusion mass with no stenosis  However based on her x-ray which was done about a month ago she has moderate arthritis of the medial compartment and this does not explain the severe pain that she is having  I would recommend that she get a root block at L3 or 4 to see if we can block that area  I also gave her an injection in the left knee  Procedure note  Injection  Verbal consent was obtained to inject the left knee  Timeout procedure was completed to confirm injection site  Diagnosis chronic pain osteoarthritis  Medications used Depo-Medrol 40 mg Lidocaine 1% plain 3 cc  Anesthesia was provided by ethyl chloride spray  Prep was performed with alcohol  Technique of injection the knee was injected in flexion through the lateral portal  No complications were noted

## 2021-06-24 ENCOUNTER — Other Ambulatory Visit (INDEPENDENT_AMBULATORY_CARE_PROVIDER_SITE_OTHER): Payer: Self-pay

## 2021-06-24 ENCOUNTER — Encounter (INDEPENDENT_AMBULATORY_CARE_PROVIDER_SITE_OTHER): Payer: Self-pay

## 2021-06-24 ENCOUNTER — Encounter (INDEPENDENT_AMBULATORY_CARE_PROVIDER_SITE_OTHER): Payer: Self-pay | Admitting: Gastroenterology

## 2021-06-24 ENCOUNTER — Ambulatory Visit (INDEPENDENT_AMBULATORY_CARE_PROVIDER_SITE_OTHER): Payer: Medicare Other | Admitting: Gastroenterology

## 2021-06-24 ENCOUNTER — Other Ambulatory Visit: Payer: Self-pay

## 2021-06-24 VITALS — BP 138/80 | HR 81 | Temp 98.8°F | Ht 64.0 in | Wt 258.5 lb

## 2021-06-24 DIAGNOSIS — R1310 Dysphagia, unspecified: Secondary | ICD-10-CM

## 2021-06-24 NOTE — Patient Instructions (Addendum)
Make sure you are taking small bites, chewing thoroughly, avoiding dryer, thicker foods, and taking sips of liquids between bites to avoid choking, we will get you scheduled for EGD.

## 2021-06-24 NOTE — Progress Notes (Signed)
Referring Provider: Alanson Puls [facility] Primary Care Physician:  [facility] Primary GI Physician: previously established with Dr. Laural Golden  Chief Complaint  Patient presents with   Dysphagia    Pt arrives as a new patient to discuss trouble swallowing. Pills and certain foods like, meat and veggies get stuck in throat.    HPI:   [name] is a 60 y.o. female with past medical history of anxiety, arthritis, asthma, cervical cancer, depression, HTN and Hyperlipemia.  Patient presenting today as a new patient, here for dysphagia.   She reports that dysphagia has been occurring about 3-4 months, almost every time she eats, but worse with thicker foods. She has dysphagia with pills as well, sometimes she has to cough food or pills back up but, usually can drink a lot of watery and get it to pass. She does not have any issues with dysphagia with liquids. She has occasional acid reflux if she eats spicy foods, she takes tums with good result, this occurs maybe 3-4x/month. She reports she sometimes has early satiety, appetite is good though. She has some nausea with occasional vomiting, this occurs maybe twice per week and is also usually related to something she ate. She has no unintentional weight loss, states she has to lose 60 pounds before she has knee surgery, is working on this. She denies abdominal pain. Denies melena or rectal bleeding. She has a BM every day without constipation or diarrhea. She does not take NSAIDs on a regular basis or drink alcohol. She has no abdominal pain.   NSAID YQM:VHQIO powder only on occasion Social hx:no etoh or tobacco use Fam hx: grandmother had CRC in her 29s,  no liver disease or IBD.  Last Colonoscopy:06/12/13, normal  Last Endoscopy:never  Past Medical History:  Diagnosis Date   Anxiety    Arthritis    Asthma    Cancer ([place])    cervical cancer; pt states a long long time ago   Depression    Essential (primary)  hypertension 06/24/2020   Hyperlipemia     Past Surgical History:  Procedure Laterality Date   ABDOMINAL HYSTERECTOMY     ANTERIOR LAT LUMBAR FUSION N/A 11/05/2019   Procedure: Lumbar Four-Five Anterolateral lumbar interbody fusion with lateral plate;  Surgeon: Kristeen Miss, MD;  Location: [place];  Service: Neurosurgery;  Laterality: N/A;  anterolateral   BACK SURGERY     pins   BACK SURGERY  2010   BLADDER REPAIR     CESAREAN SECTION  1982, 1984   COLONOSCOPY N/A 06/12/2013   Procedure: COLONOSCOPY;  Surgeon: Rogene Houston, MD;  [facility];  Service: Endoscopy;  Laterality: N/A;  930-moved to 1020 Ann to notify pt   KNEE ARTHROSCOPY WITH MEDIAL MENISECTOMY Right 05/09/2019   Procedure: KNEE ARTHROSCOPY WITH MEDIAL MENISECTOMY AND LATERAL MENISECTOMY;  Surgeon: Carole Civil, MD;  [facility];  Service: Orthopedics;  Laterality: Right;   KNEE ARTHROSCOPY WITH MEDIAL MENISECTOMY Left 06/27/2019   Procedure: KNEE ARTHROSCOPY WITH MEDIAL MENISCECTOMY;  Surgeon: Carole Civil, MD;  [facility];  Service: Orthopedics;  Laterality: Left;    Current Outpatient Medications  Medication Sig Dispense Refill   acetaminophen (TYLENOL) 500 MG tablet Take 1,000 mg by mouth every 6 (six) hours as needed for moderate pain.     ARIPiprazole (ABILIFY) 2 MG tablet Take 2 mg by mouth daily.     Cholecalciferol (VITAMIN D3) 50 MCG (2000 UT) capsule Take 2,000  Units by mouth daily.      EPINEPHrine (PRIMATENE MIST IN) Inhale 1 puff into the lungs daily as needed (shortness of breath).     estradiol (ESTRACE) 2 MG tablet TAKE ONE TABLET AT BEDTIME 30 tablet 11   FLUoxetine (PROZAC) 40 MG capsule Take 40 mg by mouth daily.     gabapentin (NEURONTIN) 300 MG capsule Take 1 capsule (300 mg total) by mouth 3 (three) times daily. 21 capsule 3   loratadine (CLARITIN) 10 MG tablet Take 10 mg by mouth daily.     metFORMIN (GLUCOPHAGE) 500 MG tablet Take 500 mg by mouth 2 (two) times  daily with a meal.     Multiple Vitamin (MULTIVITAMIN WITH MINERALS) TABS tablet Take 1 tablet by mouth daily.     naproxen sodium (ALEVE) 220 MG tablet Take 220 mg by mouth daily as needed (pain).     oxyCODONE (OXY IR/ROXICODONE) 5 MG immediate release tablet Take 1 tablet (5 mg total) by mouth every 4 (four) hours as needed for severe pain. 30 tablet 0   oxyCODONE (OXY IR/ROXICODONE) 5 MG immediate release tablet Take 1-2 tablets (5-10 mg total) by mouth every 3 (three) hours as needed for moderate pain or severe pain ((score 7 to 10)). 30 tablet 0   phentermine (ADIPEX-P) 37.5 MG tablet Take by mouth.     pravastatin (PRAVACHOL) 40 MG tablet Take 40 mg by mouth at bedtime.      traZODone (DESYREL) 100 MG tablet Take 200 mg by mouth at bedtime.     No current facility-administered medications for this visit.    Allergies as of 06/24/2021   (No Known Allergies)    Family History  Problem Relation Age of Onset   Lung cancer Mother    Colon cancer Other     Social History   Socioeconomic History   Marital status: Divorced    Spouse name: Not on file   Number of children: Not on file   Years of education: Not on file   Highest education level: Not on file  Occupational History   Not on file  Tobacco Use   Smoking status: Former    Packs/day: 1.00    Years: 20.00    Pack years: 20.00    Types: Cigarettes   Smokeless tobacco: Never   Tobacco comments:    trying to quit  Vaping Use   Vaping Use: Some days  Substance and Sexual Activity   Alcohol use: No    Alcohol/week: 0.0 standard drinks   Drug use: No   Sexual activity: Not Currently    Birth control/protection: Surgical  Other Topics Concern   Not on file  Social History Narrative   Not on file   Social Determinants of Health   Financial Resource Strain: Not on file  Food Insecurity: Not on file  Transportation Needs: Not on file  Physical Activity: Not on file  Stress: Not on file  Social Connections: Not  on file   Review of systems General: negative for malaise, night sweats, fever, chills, weight loss Neck: Negative for lumps, goiter, pain and significant neck swelling Resp: Negative for cough, wheezing, dyspnea at rest CV: Negative for chest pain, leg swelling, palpitations, orthopnea GI: denies melena, hematochezia, diarrhea, constipation,  odyonophagia,  or unintentional weight loss. +dysphagia +early satiety +nausea with occasional vomiting MSK: Negative for joint pain or swelling, back pain, and muscle pain. Derm: Negative for itching or rash Psych: Denies depression, anxiety, memory loss, confusion. No homicidal  or suicidal ideation.  Heme: Negative for prolonged bleeding, bruising easily, and swollen nodes. Endocrine: Negative for cold or heat intolerance, polyuria, polydipsia and goiter. Neuro: negative for tremor, gait imbalance, syncope and seizures. The remainder of the review of systems is noncontributory.  Physical Exam: Ht 5\' 4"  (1.626 m)    Wt 258 lb 8 oz (117.3 kg)    BMI 44.37 kg/m  General:   Alert and oriented. No distress noted. Pleasant and cooperative.  Head:  Normocephalic and atraumatic. Eyes:  Conjuctiva clear without scleral icterus. Mouth:  Oral mucosa pink and moist. Good dentition. No lesions. Heart: Normal rate and rhythm, s1 and s2 heart sounds present.  Lungs: Clear lung sounds in all lobes. Respirations equal and unlabored. Abdomen:  +BS, soft, non-tender and non-distended. No rebound or guarding. No HSM or masses noted. Derm: No palmar erythema or jaundice Msk:  Symmetrical without gross deformities. Normal posture. Extremities:  Without edema. Neurologic:  Alert and  oriented x4 Psych:  Alert and cooperative. Normal mood and affect.  Invalid input(s): 6 MONTHS   ASSESSMENT: SINCERITY CEDAR is a 60 y.o. female presenting today as new patient for further evaluation of dysphagia.   Dysphagia has been present x3-4 months with some occasional  early satiety. She reports infrequent reflux symptoms, usually induced by spicy foods, needs tums maybe 3-4x/month for this. She has occasional early satiety and sometimes has nausea with vomiting if she eats something that does not agree with her, reassuringly she has no alarm symptoms, she does not take frequent NSAIDs or drink alcohol, low suspicion for malignancy, She has never had an EGD in the past. I suspect she could have a benign esophageal stricture or esophageal stenosis, we will get her scheduled for EGD +/- dilation for further evaluation, I will hold off on PPI initiation at this time, further recommendations to follow after completion of endoscopic evaluation.  Indications, risks and benefits of procedure discussed in detail with patient. Patient verbalized understanding and is in agreement to proceed with EGD at this time.    PLAN:  Scheduled EGD 2. Chewing precautions 3. Further recommendations regarding PPI initiation after EGD   Follow Up: TBD after EGD  Gervis Gaba L. Alver Sorrow, MSN, APRN, AGNP-C Adult-Gerontology Nurse Practitioner [facility]

## 2021-06-24 NOTE — H&P (View-Only) (Signed)
Referring Provider: Alanson Puls [facility] Primary Care Physician:  [facility] Primary GI Physician: previously established with Dr. Laural Golden  Chief Complaint  Patient presents with   Dysphagia    Pt arrives as a new patient to discuss trouble swallowing. Pills and certain foods like, meat and veggies get stuck in throat.    HPI:   [name] is a 60 y.o. female with past medical history of anxiety, arthritis, asthma, cervical cancer, depression, HTN and Hyperlipemia.  Patient presenting today as a new patient, here for dysphagia.   She reports that dysphagia has been occurring about 3-4 months, almost every time she eats, but worse with thicker foods. She has dysphagia with pills as well, sometimes she has to cough food or pills back up but, usually can drink a lot of watery and get it to pass. She does not have any issues with dysphagia with liquids. She has occasional acid reflux if she eats spicy foods, she takes tums with good result, this occurs maybe 3-4x/month. She reports she sometimes has early satiety, appetite is good though. She has some nausea with occasional vomiting, this occurs maybe twice per week and is also usually related to something she ate. She has no unintentional weight loss, states she has to lose 60 pounds before she has knee surgery, is working on this. She denies abdominal pain. Denies melena or rectal bleeding. She has a BM every day without constipation or diarrhea. She does not take NSAIDs on a regular basis or drink alcohol. She has no abdominal pain.   NSAID VQM:GQQPY powder only on occasion Social hx:no etoh or tobacco use Fam hx: grandmother had CRC in her 104s,  no liver disease or IBD.  Last Colonoscopy:06/12/13, normal  Last Endoscopy:never  Past Medical History:  Diagnosis Date   Anxiety    Arthritis    Asthma    Cancer ([place])    cervical cancer; pt states a long long time ago   Depression    Essential (primary)  hypertension 06/24/2020   Hyperlipemia     Past Surgical History:  Procedure Laterality Date   ABDOMINAL HYSTERECTOMY     ANTERIOR LAT LUMBAR FUSION N/A 11/05/2019   Procedure: Lumbar Four-Five Anterolateral lumbar interbody fusion with lateral plate;  Surgeon: Kristeen Miss, MD;  Location: [place];  Service: Neurosurgery;  Laterality: N/A;  anterolateral   BACK SURGERY     pins   BACK SURGERY  2010   BLADDER REPAIR     CESAREAN SECTION  1982, 1984   COLONOSCOPY N/A 06/12/2013   Procedure: COLONOSCOPY;  Surgeon: Rogene Houston, MD;  [facility];  Service: Endoscopy;  Laterality: N/A;  930-moved to 1020 Ann to notify pt   KNEE ARTHROSCOPY WITH MEDIAL MENISECTOMY Right 05/09/2019   Procedure: KNEE ARTHROSCOPY WITH MEDIAL MENISECTOMY AND LATERAL MENISECTOMY;  Surgeon: Carole Civil, MD;  [facility];  Service: Orthopedics;  Laterality: Right;   KNEE ARTHROSCOPY WITH MEDIAL MENISECTOMY Left 06/27/2019   Procedure: KNEE ARTHROSCOPY WITH MEDIAL MENISCECTOMY;  Surgeon: Carole Civil, MD;  [facility];  Service: Orthopedics;  Laterality: Left;    Current Outpatient Medications  Medication Sig Dispense Refill   acetaminophen (TYLENOL) 500 MG tablet Take 1,000 mg by mouth every 6 (six) hours as needed for moderate pain.     ARIPiprazole (ABILIFY) 2 MG tablet Take 2 mg by mouth daily.     Cholecalciferol (VITAMIN D3) 50 MCG (2000 UT) capsule Take 2,000  Units by mouth daily.      EPINEPHrine (PRIMATENE MIST IN) Inhale 1 puff into the lungs daily as needed (shortness of breath).     estradiol (ESTRACE) 2 MG tablet TAKE ONE TABLET AT BEDTIME 30 tablet 11   FLUoxetine (PROZAC) 40 MG capsule Take 40 mg by mouth daily.     gabapentin (NEURONTIN) 300 MG capsule Take 1 capsule (300 mg total) by mouth 3 (three) times daily. 21 capsule 3   loratadine (CLARITIN) 10 MG tablet Take 10 mg by mouth daily.     metFORMIN (GLUCOPHAGE) 500 MG tablet Take 500 mg by mouth 2 (two) times  daily with a meal.     Multiple Vitamin (MULTIVITAMIN WITH MINERALS) TABS tablet Take 1 tablet by mouth daily.     naproxen sodium (ALEVE) 220 MG tablet Take 220 mg by mouth daily as needed (pain).     oxyCODONE (OXY IR/ROXICODONE) 5 MG immediate release tablet Take 1 tablet (5 mg total) by mouth every 4 (four) hours as needed for severe pain. 30 tablet 0   oxyCODONE (OXY IR/ROXICODONE) 5 MG immediate release tablet Take 1-2 tablets (5-10 mg total) by mouth every 3 (three) hours as needed for moderate pain or severe pain ((score 7 to 10)). 30 tablet 0   phentermine (ADIPEX-P) 37.5 MG tablet Take by mouth.     pravastatin (PRAVACHOL) 40 MG tablet Take 40 mg by mouth at bedtime.      traZODone (DESYREL) 100 MG tablet Take 200 mg by mouth at bedtime.     No current facility-administered medications for this visit.    Allergies as of 06/24/2021   (No Known Allergies)    Family History  Problem Relation Age of Onset   Lung cancer Mother    Colon cancer Other     Social History   Socioeconomic History   Marital status: Divorced    Spouse name: Not on file   Number of children: Not on file   Years of education: Not on file   Highest education level: Not on file  Occupational History   Not on file  Tobacco Use   Smoking status: Former    Packs/day: 1.00    Years: 20.00    Pack years: 20.00    Types: Cigarettes   Smokeless tobacco: Never   Tobacco comments:    trying to quit  Vaping Use   Vaping Use: Some days  Substance and Sexual Activity   Alcohol use: No    Alcohol/week: 0.0 standard drinks   Drug use: No   Sexual activity: Not Currently    Birth control/protection: Surgical  Other Topics Concern   Not on file  Social History Narrative   Not on file   Social Determinants of Health   Financial Resource Strain: Not on file  Food Insecurity: Not on file  Transportation Needs: Not on file  Physical Activity: Not on file  Stress: Not on file  Social Connections: Not  on file   Review of systems General: negative for malaise, night sweats, fever, chills, weight loss Neck: Negative for lumps, goiter, pain and significant neck swelling Resp: Negative for cough, wheezing, dyspnea at rest CV: Negative for chest pain, leg swelling, palpitations, orthopnea GI: denies melena, hematochezia, diarrhea, constipation,  odyonophagia,  or unintentional weight loss. +dysphagia +early satiety +nausea with occasional vomiting MSK: Negative for joint pain or swelling, back pain, and muscle pain. Derm: Negative for itching or rash Psych: Denies depression, anxiety, memory loss, confusion. No homicidal  or suicidal ideation.  Heme: Negative for prolonged bleeding, bruising easily, and swollen nodes. Endocrine: Negative for cold or heat intolerance, polyuria, polydipsia and goiter. Neuro: negative for tremor, gait imbalance, syncope and seizures. The remainder of the review of systems is noncontributory.  Physical Exam: Ht 5\' 4"  (1.626 m)    Wt 258 lb 8 oz (117.3 kg)    BMI 44.37 kg/m  General:   Alert and oriented. No distress noted. Pleasant and cooperative.  Head:  Normocephalic and atraumatic. Eyes:  Conjuctiva clear without scleral icterus. Mouth:  Oral mucosa pink and moist. Good dentition. No lesions. Heart: Normal rate and rhythm, s1 and s2 heart sounds present.  Lungs: Clear lung sounds in all lobes. Respirations equal and unlabored. Abdomen:  +BS, soft, non-tender and non-distended. No rebound or guarding. No HSM or masses noted. Derm: No palmar erythema or jaundice Msk:  Symmetrical without gross deformities. Normal posture. Extremities:  Without edema. Neurologic:  Alert and  oriented x4 Psych:  Alert and cooperative. Normal mood and affect.  Invalid input(s): 6 MONTHS   ASSESSMENT: [name] is a 60 y.o. female presenting today as new patient for further evaluation of dysphagia.   Dysphagia has been present x3-4 months with some occasional  early satiety. She reports infrequent reflux symptoms, usually induced by spicy foods, needs tums maybe 3-4x/month for this. She has occasional early satiety and sometimes has nausea with vomiting if she eats something that does not agree with her, reassuringly she has no alarm symptoms, she does not take frequent NSAIDs or drink alcohol, low suspicion for malignancy, She has never had an EGD in the past. I suspect she could have a benign esophageal stricture or esophageal stenosis, we will get her scheduled for EGD +/- dilation for further evaluation, I will hold off on PPI initiation at this time, further recommendations to follow after completion of endoscopic evaluation.  Indications, risks and benefits of procedure discussed in detail with patient. Patient verbalized understanding and is in agreement to proceed with EGD at this time.    PLAN:  Scheduled EGD 2. Chewing precautions 3. Further recommendations regarding PPI initiation after EGD   Follow Up: TBD after EGD  Cauy Melody L. Alver Sorrow, MSN, APRN, AGNP-C Adult-Gerontology Nurse Practitioner [facility]

## 2021-06-24 NOTE — Addendum Note (Signed)
Addended by: Saintclair Halsted A on: 06/24/2021 02:04 PM   Modules accepted: Orders

## 2021-06-25 NOTE — Patient Instructions (Signed)
[name]  06/25/2021     @PREFPERIOPPHARMACY @   Your procedure is scheduled on  06/29/2021.   Report to [facility] at  0700  A.M.   Call this number if you have problems the morning of surgery:  [phone]   Remember:  Follow the diet instructions given to you by the office.    DO NOT take any medications for diabetes the morning of your procedure.    Take these medicines the morning of surgery with A SIP OF WATER        abilify, prozac, gabapentin, claritin, oxycodone(if needed)     Do not wear jewelry, make-up or nail polish.  Do not wear lotions, powders, or perfumes, or deodorant.  Do not shave 48 hours prior to surgery.  Men may shave face and neck.  Do not bring valuables to the hospital.  [facility] is not responsible for any belongings or valuables.  Contacts, dentures or bridgework may not be worn into surgery.  Leave your suitcase in the car.  After surgery it may be brought to your room.  For patients admitted to the hospital, discharge time will be determined by your treatment team.  Patients discharged the day of surgery will not be allowed to drive home and must have someone with them for 24 hours.    Special instructions:   DO NOT smoke tobacco or vape for 24 hours before your procedure.  Please read over the following fact sheets that you were given. Anesthesia Post-op Instructions and Care and Recovery After Surgery      Upper Endoscopy, Adult, Care After This sheet gives you information about how to care for yourself after your procedure. Your health care provider may also give you more specific instructions. If you have problems or questions, contact your health care provider. What can I expect after the procedure? After the procedure, it is common to have: A sore throat. Mild stomach pain or discomfort. Bloating. Nausea. Follow these instructions at home:  Follow instructions from your health care provider about what to  eat or drink after your procedure. Return to your normal activities as told by your health care provider. Ask your health care provider what activities are safe for you. Take over-the-counter and prescription medicines only as told by your health care provider. If you were given a sedative during the procedure, it can affect you for several hours. Do not drive or operate machinery until your health care provider says that it is safe. Keep all follow-up visits as told by your health care provider. This is important. Contact a health care provider if you have: A sore throat that lasts longer than one day. Trouble swallowing. Get help right away if: You vomit blood or your vomit looks like coffee grounds. You have: A fever. Bloody, black, or tarry stools. A severe sore throat or you cannot swallow. Difficulty breathing. Severe pain in your chest or abdomen. Summary After the procedure, it is common to have a sore throat, mild stomach discomfort, bloating, and nausea. If you were given a sedative during the procedure, it can affect you for several hours. Do not drive or operate machinery until your health care provider says that it is safe. Follow instructions from your health care provider about what to eat or drink after your procedure. Return to your normal activities as told by your health care provider. This information is not intended to replace advice given to you by your health  care provider. Make sure you discuss any questions you have with your health care provider. Document Revised: 05/03/2019 Document Reviewed: 11/27/2017 Elsevier Patient Education  2022 [employer] After This sheet gives you information about how to care for yourself after your procedure. Your health care provider may also give you more specific instructions. If you have problems or questions, contact your health care provider. What can I expect after the procedure? After the  procedure, it is common to have: Tiredness. Forgetfulness about what happened after the procedure. Impaired judgment for important decisions. Nausea or vomiting. Some difficulty with balance. Follow these instructions at home: For the time period you were told by your health care provider:   Rest as needed. Do not participate in activities where you could fall or become injured. Do not drive or use machinery. Do not drink alcohol. Do not take sleeping pills or medicines that cause drowsiness. Do not make important decisions or sign legal documents. Do not take care of children on your own. Eating and drinking Follow the diet that is recommended by your health care provider. Drink enough fluid to keep your urine pale yellow. If you vomit: Drink water, juice, or soup when you can drink without vomiting. Make sure you have little or no nausea before eating solid foods. General instructions Have a responsible adult stay with you for the time you are told. It is important to have someone help care for you until you are awake and alert. Take over-the-counter and prescription medicines only as told by your health care provider. If you have sleep apnea, surgery and certain medicines can increase your risk for breathing problems. Follow instructions from your health care provider about wearing your sleep device: Anytime you are sleeping, including during daytime naps. While taking prescription pain medicines, sleeping medicines, or medicines that make you drowsy. Avoid smoking. Keep all follow-up visits as told by your health care provider. This is important. Contact a health care provider if: You keep feeling nauseous or you keep vomiting. You feel light-headed. You are still sleepy or having trouble with balance after 24 hours. You develop a rash. You have a fever. You have redness or swelling around the IV site. Get help right away if: You have trouble breathing. You have new-onset  confusion at home. Summary For several hours after your procedure, you may feel tired. You may also be forgetful and have poor judgment. Have a responsible adult stay with you for the time you are told. It is important to have someone help care for you until you are awake and alert. Rest as told. Do not drive or operate machinery. Do not drink alcohol or take sleeping pills. Get help right away if you have trouble breathing, or if you suddenly become confused. This information is not intended to replace advice given to you by your health care provider. Make sure you discuss any questions you have with your health care provider. Document Revised: 03/12/2020 Document Reviewed: 05/30/2019 Elsevier Patient Education  2022 [employer].

## 2021-06-28 ENCOUNTER — Other Ambulatory Visit: Payer: Self-pay

## 2021-06-28 ENCOUNTER — Encounter (HOSPITAL_COMMUNITY): Payer: Self-pay

## 2021-06-28 ENCOUNTER — Encounter (HOSPITAL_COMMUNITY)
Admission: RE | Admit: 2021-06-28 | Discharge: 2021-06-28 | Disposition: A | Discharge status: Home / Self Care | Payer: Medicare Other | Source: Ambulatory Visit | Attending: Gastroenterology | Admitting: Gastroenterology

## 2021-06-28 VITALS — BP 126/63 | HR 70 | Temp 98.8°F | Resp 18 | Ht 64.0 in | Wt 258.5 lb

## 2021-06-28 DIAGNOSIS — E119 Type 2 diabetes mellitus without complications: Secondary | ICD-10-CM

## 2021-06-28 DIAGNOSIS — Z01812 Encounter for preprocedural laboratory examination: Secondary | ICD-10-CM | POA: Insufficient documentation

## 2021-06-28 LAB — BASIC METABOLIC PANEL
Anion gap: 8 (ref 5–15)
BUN: 11 mg/dL (ref 6–20)
CO2: 25 mmol/L (ref 22–32)
Calcium: 9.1 mg/dL (ref 8.9–10.3)
Chloride: 105 mmol/L (ref 98–111)
Creatinine, Ser: 0.69 mg/dL (ref 0.44–1.00)
GFR, Estimated: >60 mL/min (ref >60)
Glucose, Bld: 128 mg/dL — ABNORMAL HIGH (ref 70–99)
Potassium: 3.8 mmol/L (ref 3.5–5.1)
Sodium: 138 mmol/L (ref 135–145)

## 2021-06-29 ENCOUNTER — Ambulatory Visit (HOSPITAL_COMMUNITY)
Admission: RE | Admit: 2021-06-29 | Discharge: 2021-06-29 | Disposition: A | Discharge status: Home / Self Care | Payer: Medicare Other | Attending: Gastroenterology | Admitting: Gastroenterology

## 2021-06-29 ENCOUNTER — Encounter (HOSPITAL_COMMUNITY)
Admission: RE | Disposition: A | Discharge status: Home / Self Care | Payer: Self-pay | Source: Home / Self Care | Attending: Gastroenterology

## 2021-06-29 ENCOUNTER — Encounter (HOSPITAL_COMMUNITY): Payer: Self-pay | Admitting: Gastroenterology

## 2021-06-29 ENCOUNTER — Ambulatory Visit (HOSPITAL_COMMUNITY): Payer: Medicare Other | Admitting: Anesthesiology

## 2021-06-29 DIAGNOSIS — K3189 Other diseases of stomach and duodenum: Secondary | ICD-10-CM | POA: Insufficient documentation

## 2021-06-29 DIAGNOSIS — E785 Hyperlipidemia, unspecified: Secondary | ICD-10-CM | POA: Diagnosis not present

## 2021-06-29 DIAGNOSIS — R1310 Dysphagia, unspecified: Secondary | ICD-10-CM

## 2021-06-29 DIAGNOSIS — I10 Essential (primary) hypertension: Secondary | ICD-10-CM | POA: Diagnosis not present

## 2021-06-29 DIAGNOSIS — K259 Gastric ulcer, unspecified as acute or chronic, without hemorrhage or perforation: Secondary | ICD-10-CM | POA: Insufficient documentation

## 2021-06-29 DIAGNOSIS — E119 Type 2 diabetes mellitus without complications: Secondary | ICD-10-CM | POA: Insufficient documentation

## 2021-06-29 DIAGNOSIS — Z87891 Personal history of nicotine dependence: Secondary | ICD-10-CM | POA: Insufficient documentation

## 2021-06-29 DIAGNOSIS — Z800 Family history of malignant neoplasm of digestive organs: Secondary | ICD-10-CM | POA: Diagnosis not present

## 2021-06-29 DIAGNOSIS — K2100 Gastro-esophageal reflux disease with esophagitis, without bleeding: Secondary | ICD-10-CM | POA: Diagnosis not present

## 2021-06-29 DIAGNOSIS — R12 Heartburn: Secondary | ICD-10-CM | POA: Diagnosis not present

## 2021-06-29 HISTORY — PX: BIOPSY: SHX5522

## 2021-06-29 HISTORY — DX: Type 2 diabetes mellitus without complications: E11.9

## 2021-06-29 HISTORY — PX: ESOPHAGOGASTRODUODENOSCOPY (EGD) WITH PROPOFOL: SHX5813

## 2021-06-29 LAB — GLUCOSE, CAPILLARY: Glucose-Capillary: 122 mg/dL — ABNORMAL HIGH (ref 70–99)

## 2021-06-29 SURGERY — ESOPHAGOGASTRODUODENOSCOPY (EGD) WITH PROPOFOL
Anesthesia: General

## 2021-06-29 MED ORDER — LACTATED RINGERS IV SOLN: INTRAVENOUS | Status: DC

## 2021-06-29 MED ORDER — PROPOFOL 500 MG/50ML IV EMUL
INTRAVENOUS | Status: DC | PRN
  Administered 2021-06-29: 150 ug/kg/min via INTRAVENOUS

## 2021-06-29 MED ORDER — PHENYLEPHRINE 40 MCG/ML (10ML) SYRINGE FOR IV PUSH (FOR BLOOD PRESSURE SUPPORT)
PREFILLED_SYRINGE | INTRAVENOUS | Status: AC
  Filled 2021-06-29: qty 10

## 2021-06-29 MED ORDER — OMEPRAZOLE 40 MG PO CPDR: 40.0000 mg | DELAYED_RELEASE_CAPSULE | Freq: Every day | ORAL | 3 refills | Status: DC

## 2021-06-29 MED ORDER — PHENYLEPHRINE HCL (PRESSORS) 10 MG/ML IV SOLN
INTRAVENOUS | Status: DC | PRN
Start: 2021-06-29 — End: 2021-06-29
  Administered 2021-06-29: 200 ug via INTRAVENOUS

## 2021-06-29 MED ORDER — PROPOFOL 1000 MG/100ML IV EMUL
INTRAVENOUS | Status: AC
  Filled 2021-06-29: qty 100

## 2021-06-29 NOTE — Interval H&P Note (Signed)
History and Physical Interval Note:  06/29/2021 8:11 AM  [name]  has presented today for surgery, with the diagnosis of Dysphagia.  The various methods of treatment have been discussed with the patient and family. After consideration of risks, benefits and other options for treatment, the patient has consented to  Procedure(s) with comments: ESOPHAGOGASTRODUODENOSCOPY (EGD) WITH PROPOFOL (N/A) - 8:40 as a surgical intervention.  The patient's history has been reviewed, patient examined, no change in status, stable for surgery.  I have reviewed the patient's chart and labs.  Questions were answered to the patient's satisfaction.     [name]

## 2021-06-29 NOTE — Transfer of Care (Signed)
Immediate Anesthesia Transfer of Care Note  Patient: [name]  Procedure(s) Performed: ESOPHAGOGASTRODUODENOSCOPY (EGD) WITH PROPOFOL BIOPSY  Patient Location: PACU  Anesthesia Type:General  Level of Consciousness: awake, alert , oriented and patient cooperative  Airway & Oxygen Therapy: Patient Spontanous Breathing  Post-op Assessment: Report given to RN, Post -op Vital signs reviewed and stable and Patient moving all extremities X 4  Post vital signs: Reviewed and stable  Last Vitals:  Vitals Value Taken Time  BP 106/51 06/29/21 0918  Temp 36.7 C 06/29/21 0918  Pulse 68 06/29/21 0918  Resp 16 06/29/21 0918  SpO2 97 % 06/29/21 0918    Last Pain:  Vitals:   06/29/21 0918  TempSrc: Axillary  PainSc: 0-No pain      Patients Stated Pain Goal: 7 (54/36/06 7703)  Complications: No notable events documented.

## 2021-06-29 NOTE — Discharge Instructions (Signed)
You are being discharged to home.  Resume your previous diet.  We are waiting for your pathology results.  Take Prilosec (omeprazole) 40 mg by mouth once a day.  

## 2021-06-29 NOTE — Anesthesia Preprocedure Evaluation (Addendum)
Anesthesia Evaluation  Patient identified by MRN, date of birth, ID band Patient awake    Reviewed: Allergy & Precautions, NPO status , Patient's Chart, lab work & pertinent test results  Airway Mallampati: II  TM Distance: >3 FB Neck ROM: Full   Comment: Cervical radiculopathy Dental  (+) Edentulous Upper, Edentulous Lower   Pulmonary asthma , former smoker,    Pulmonary exam normal breath sounds clear to auscultation       Cardiovascular Exercise Tolerance: Poor hypertension, Pt. on medications Normal cardiovascular exam Rhythm:Regular Rate:Normal  04-May-2021 11:34:31 [facility] ROUTINE RECORD [birth year] (59 yr) Female Caucasian Vent. rate 78 BPM PR interval 162 ms QRS duration 78 ms QT/QTcB 390/444 ms P-R-T axes 35 59 61 Normal sinus rhythm Low voltage QRS Borderline ECG Confirmed by Asencion Noble [id number]) on 05/05/2021 6:40:01 AM   Neuro/Psych PSYCHIATRIC DISORDERS Anxiety Depression  Neuromuscular disease    GI/Hepatic Neg liver ROS,   Endo/Other  diabetes, Well Controlled, Type 2, Oral Hypoglycemic AgentsMorbid obesity  Renal/GU negative Renal ROS  Female GU complaint (cervical cancer)     Musculoskeletal  (+) Arthritis , Osteoarthritis,  narcotic dependent  Abdominal   Peds  Hematology   Anesthesia Other Findings   Reproductive/Obstetrics negative OB ROS                           Anesthesia Physical Anesthesia Plan  ASA: 3  Anesthesia Plan: General   Post-op Pain Management: Minimal or no pain anticipated   Induction: Intravenous  PONV Risk Score and Plan: TIVA  Airway Management Planned: Nasal Cannula and Natural Airway  Additional Equipment:   Intra-op Plan:   Post-operative Plan:   Informed Consent: I have reviewed the patients History and Physical, chart, labs and discussed the procedure including the risks, benefits and alternatives for  the proposed anesthesia with the patient or authorized representative who has indicated his/her understanding and acceptance.     Dental advisory given  Plan Discussed with: CRNA and Surgeon  Anesthesia Plan Comments:        Anesthesia Quick Evaluation

## 2021-06-29 NOTE — Anesthesia Postprocedure Evaluation (Signed)
Anesthesia Post Note  Patient: [name]  Procedure(s) Performed: ESOPHAGOGASTRODUODENOSCOPY (EGD) WITH PROPOFOL BIOPSY  Patient location during evaluation: Phase II Anesthesia Type: General Level of consciousness: awake and alert and oriented Pain management: pain level controlled Vital Signs Assessment: post-procedure vital signs reviewed and stable Respiratory status: spontaneous breathing, nonlabored ventilation and respiratory function stable Cardiovascular status: blood pressure returned to baseline and stable Postop Assessment: no apparent nausea or vomiting Anesthetic complications: no   No notable events documented.   Last Vitals:  Vitals:   06/29/21 0749 06/29/21 0918  BP: (!) 108/53 (!) 106/51  Pulse: 69 68  Resp: 18 16  Temp: 36.5 C 36.7 C  SpO2: 95% 97%    Last Pain:  Vitals:   06/29/21 0918  TempSrc: Axillary  PainSc: 0-No pain                 Trust Leh C Eion Timbrook

## 2021-06-29 NOTE — Op Note (Addendum)
[facility] Patient Name: [name] Procedure Date: 06/29/2021 8:50 AM MRN: [id number] Date of Birth: [birth year] Attending MD: Maylon Peppers ,  CSN: [id number] Age: 60 Admit Type: Outpatient Procedure:                Upper GI endoscopy Indications:              Dysphagia, Heartburn Providers:                Maylon Peppers, Caprice Kluver, Raphael Gibney,                            Technician Referring MD:              Medicines:                Monitored Anesthesia Care Complications:            No immediate complications. Estimated Blood Loss:     Estimated blood loss: none. Procedure:                Pre-Anesthesia Assessment:                           - Prior to the procedure, a History and Physical                            was performed, and patient medications, allergies                            and sensitivities were reviewed. The patient's                            tolerance of previous anesthesia was reviewed.                           - The risks and benefits of the procedure and the                            sedation options and risks were discussed with the                            patient. All questions were answered and informed                            consent was obtained.                           - ASA Grade Assessment: III - A patient with severe                            systemic disease.                           After obtaining informed consent, the endoscope was                            passed under direct vision. Throughout the  procedure, the patient's blood pressure, pulse, and                            oxygen saturations were monitored continuously. The                            GIF-H190 ([id number]) scope was introduced through the                            mouth, and advanced to the second part of duodenum.                            The upper GI endoscopy was accomplished without                             difficulty. The patient tolerated the procedure                            well. Scope In: 9:07:45 AM Scope Out: 9:14:32 AM Total Procedure Duration: 0 hours 6 minutes 47 seconds  Findings:      LA Grade B (one or more mucosal breaks greater than 5 mm, not extending       between the tops of two mucosal folds) esophagitis with no bleeding was       found in the lower third of the esophagus.      Multiple localized medium erosions with no stigmata of recent bleeding       were found in the gastric body and in the gastric antrum. Some of the       erosions had a yellowish coating, especially in the antrum Biopsies from       body and antrum were taken with a cold forceps for Helicobacter pylori       testing.      The examined duodenum was normal. Impression:               - LA Grade B reflux esophagitis with no bleeding.                           - Erosive gastropathy with no stigmata of recent                            bleeding. Biopsied.                           - Normal examined duodenum. Moderate Sedation:      Per Anesthesia Care Recommendation:           - Discharge patient to home (ambulatory).                           - Resume previous diet.                           - Await pathology results.                           - Use Prilosec (omeprazole) 40 mg PO  daily.                           - May consider repeating EGD for empiric dilation                            if dysphagia not improving with Prilosec.                           - No ibuprofen, naproxen, or other non-steroidal                            anti-inflammatory drugs. Procedure Code(s):        --- Professional ---                           [phone], Esophagogastroduodenoscopy, flexible,                            transoral; with biopsy, single or multiple Diagnosis Code(s):        --- Professional ---                           K21.00, Gastro-esophageal reflux disease with                            esophagitis,  without bleeding                           K31.89, Other diseases of stomach and duodenum                           R13.10, Dysphagia, unspecified                           R12, Heartburn CPT copyright 2019 American Medical Association. All rights reserved. The codes documented in this report are preliminary and upon coder review may  be revised to meet current compliance requirements. Maylon Peppers, MD Maylon Peppers,  06/29/2021 9:21:13 AM This report has been signed electronically. Number of Addenda: 0

## 2021-06-30 LAB — SURGICAL PATHOLOGY

## 2021-07-01 ENCOUNTER — Encounter (HOSPITAL_COMMUNITY): Payer: Self-pay | Admitting: Gastroenterology

## 2021-08-30 ENCOUNTER — Ambulatory Visit: Payer: Self-pay | Admitting: Urology

## 2021-08-30 NOTE — Progress Notes (Deleted)
   Assessment: No diagnosis found.   Plan: ***  Chief Complaint: No chief complaint on file.   History of Present Illness:  [name] is a 61 y.o. year old female who is seen in consultation from No primary care provider on file.  for evaluation of ***.   Past Medical History:  No past medical history on file.  Past Surgical History:  *** The histories are not reviewed yet. Please review them in the "History" navigator section and refresh this [place].  Allergies:  Not on File  Family History:  No family history on file.  Social History:     Review of symptoms:  Constitutional:  Negative for unexplained weight loss, night sweats, fever, chills ENT:  Negative for nose bleeds, sinus pain, painful swallowing CV:  Negative for chest pain, shortness of breath, exercise intolerance, palpitations, loss of consciousness Resp:  Negative for cough, wheezing, shortness of breath GI:  Negative for nausea, vomiting, diarrhea, bloody stools GU:  Positives noted in HPI; otherwise negative for gross hematuria, dysuria Neuro:  Negative for seizures, poor balance, limb weakness, slurred speech Psych:  Negative for lack of energy, depression, anxiety Endocrine:  Negative for polydipsia, polyuria, symptoms of hypoglycemia (dizziness, hunger, sweating) Hematologic:  Negative for anemia, purpura, petechia, prolonged or excessive bleeding, use of anticoagulants  Allergic:  Negative for difficulty breathing or choking as a result of exposure to anything; no shellfish allergy; no allergic response (rash/itch) to materials, foods  Physical exam: There were no vitals taken for this visit. GENERAL APPEARANCE:  Well appearing, well developed, well nourished, NAD HEENT: Atraumatic, Normocephalic, oropharynx clear. NECK: Supple without lymphadenopathy or thyromegaly. LUNGS: Clear to auscultation bilaterally. HEART: Regular Rate and Rhythm without murmurs, gallops, or rubs. ABDOMEN:  Soft, non-tender, No Masses. EXTREMITIES: Moves all extremities well.  Without clubbing, cyanosis, or edema. NEUROLOGIC:  Alert and oriented x 3, normal gait, CN II-XII grossly intact.  MENTAL STATUS:  Appropriate. BACK:  Non-tender to palpation.  No CVAT SKIN:  Warm, dry and intact.    Results: No results found for this or any previous visit (from the past 24 hour(s)).

## 2021-09-02 ENCOUNTER — Other Ambulatory Visit: Payer: Self-pay

## 2021-09-02 ENCOUNTER — Ambulatory Visit (INDEPENDENT_AMBULATORY_CARE_PROVIDER_SITE_OTHER): Payer: Medicare Other | Admitting: Urology

## 2021-09-02 ENCOUNTER — Encounter: Payer: Self-pay | Admitting: Urology

## 2021-09-02 VITALS — BP 142/85 | HR 85 | Wt 250.0 lb

## 2021-09-02 DIAGNOSIS — N393 Stress incontinence (female) (male): Secondary | ICD-10-CM

## 2021-09-02 DIAGNOSIS — N3941 Urge incontinence: Secondary | ICD-10-CM

## 2021-09-02 DIAGNOSIS — R8290 Unspecified abnormal findings in urine: Secondary | ICD-10-CM

## 2021-09-02 LAB — URINALYSIS, ROUTINE W REFLEX MICROSCOPIC
Bilirubin, UA: NEGATIVE
Glucose, UA: NEGATIVE
Ketones, UA: NEGATIVE
Nitrite, UA: NEGATIVE
Protein,UA: NEGATIVE
RBC, UA: NEGATIVE
Specific Gravity, UA: 1.025 (ref 1.005–1.030)
Urobilinogen, Ur: 0.2 mg/dL (ref 0.2–1.0)
pH, UA: 6.5 (ref 5.0–7.5)

## 2021-09-02 LAB — BLADDER SCAN AMB NON-IMAGING: Scan Result: 3

## 2021-09-02 LAB — MICROSCOPIC EXAMINATION
RBC, Urine: NONE SEEN /hpf (ref 0–2)
Renal Epithel, UA: NONE SEEN /hpf

## 2021-09-02 MED ORDER — MIRABEGRON ER 50 MG PO TB24: 50.0000 mg | ORAL_TABLET | Freq: Every day | ORAL | 0 refills | Status: DC

## 2021-09-02 NOTE — Progress Notes (Signed)
Assessment: 1. Urge incontinence   2. Stress incontinence, female   3. Abnormal urine findings     Plan: I reviewed the records from her PCP. Diagnosis and management of stress and urge incontinence discussed.  Her primary complaint currently is urge incontinence.  Options for management including avoidance of dietary irritants, behavioral modification, medical therapy, neuromodulation, and chemodenervation discussed. Bladder diet sheet given. Kegel exercise instructions provided. Recommend a trial of Myrbetriq 50 mg daily.  Samples provided.  Use and side effects discussed. Urine culture sent today Return to office in 1 month.  Chief Complaint:  Chief Complaint  Patient presents with   Urinary Incontinence    History of Present Illness:  [name] is a 61 y.o. year old female who is seen in consultation from Denyce Robert, [place] for evaluation of urinary incontinence.  She reports a history of stress incontinence for a number of years.  Within the past several months, she has noted onset of urinary frequency, urgency, and nocturia.  She voids approximately 10 times during the day and 4 times at night.  She is having problems with urge incontinence.  She is using 3 poise pads per day.  She has incontinence both daytime and nighttime.  She is not having any dysuria or gross hematuria.  No recent UTIs.  No problems with constipation, diarrhea, or fecal incontinence.  She was given a trial of oxybutynin twice daily without improvement in her symptoms.  She took this for approximately 3 weeks.  No other medical therapy.  She is status post an abdominal hysterectomy a number of years ago.  She also underwent a cystocele repair a number of years ago.  She does have a history of back pain, is status post multiple back surgeries, and is currently having sciatica.   Past Medical History:  Past Medical History:  Diagnosis Date   Anxiety    Arthritis    Asthma    Cancer ([place])     cervical cancer; pt states a long long time ago   Depression    Diabetes mellitus without complication ([place])    Essential (primary) hypertension 06/24/2020   Hyperlipemia     Past Surgical History:  Past Surgical History:  Procedure Laterality Date   ABDOMINAL HYSTERECTOMY     ANTERIOR LAT LUMBAR FUSION N/A 11/05/2019   Procedure: Lumbar Four-Five Anterolateral lumbar interbody fusion with lateral plate;  Surgeon: Kristeen Miss, MD;  Location: [facility];  Service: Neurosurgery;  Laterality: N/A;  anterolateral   BACK SURGERY     pins   BACK SURGERY  07/11/2008   BIOPSY  06/29/2021   Procedure: BIOPSY;  Surgeon: Harvel Quale, MD;  [facility];  Service: Gastroenterology;;   BLADDER REPAIR     CARPAL TUNNEL RELEASE Right    [place]   COLONOSCOPY N/A 06/12/2013   [place]: normal   ESOPHAGOGASTRODUODENOSCOPY (EGD) WITH PROPOFOL N/A 06/29/2021   Procedure: ESOPHAGOGASTRODUODENOSCOPY (EGD) WITH PROPOFOL;  Surgeon: Harvel Quale, MD;  [facility];  Service: Gastroenterology;  Laterality: N/A;  8:40   KNEE ARTHROSCOPY WITH MEDIAL MENISECTOMY Right 05/09/2019   Procedure: KNEE ARTHROSCOPY WITH MEDIAL MENISECTOMY AND LATERAL MENISECTOMY;  Surgeon: Carole Civil, MD;  Location: [facility];  Service: Orthopedics;  Laterality: Right;   KNEE ARTHROSCOPY WITH MEDIAL MENISECTOMY Left 06/27/2019   Procedure: KNEE ARTHROSCOPY WITH MEDIAL MENISCECTOMY;  Surgeon: Carole Civil, MD;  Location: [facility];  Service: Orthopedics;  Laterality: Left;  Allergies:  No Known Allergies  Family History:  Family History  Problem Relation Age of Onset   Lung cancer Mother    Colon cancer Other     Social History:  Social History   Tobacco Use   Smoking status: Former    Packs/day: 1.00    Years: 20.00    Pack years: 20.00    Types: Cigarettes   Smokeless tobacco: Never   Tobacco comments:    trying to quit  Vaping Use    Vaping Use: Some days  Substance Use Topics   Alcohol use: No    Alcohol/week: 0.0 standard drinks   Drug use: No    Review of symptoms:  Constitutional:  Negative for unexplained weight loss, night sweats, fever, chills ENT:  Negative for nose bleeds, sinus pain, painful swallowing CV:  Negative for chest pain, shortness of breath, exercise intolerance, palpitations, loss of consciousness Resp:  Negative for cough, wheezing, shortness of breath GI:  Negative for nausea, vomiting, diarrhea, bloody stools GU:  Positives noted in HPI; otherwise negative for gross hematuria, dysuria Neuro:  Negative for seizures, poor balance, limb weakness, slurred speech Psych:  Negative for lack of energy, depression, anxiety Endocrine:  Negative for polydipsia, polyuria, symptoms of hypoglycemia (dizziness, hunger, sweating) Hematologic:  Negative for anemia, purpura, petechia, prolonged or excessive bleeding, use of anticoagulants  Allergic:  Negative for difficulty breathing or choking as a result of exposure to anything; no shellfish allergy; no allergic response (rash/itch) to materials, foods  Physical exam: BP (!) 142/85    Pulse 85    Wt 250 lb (113.4 kg)    BMI 42.91 kg/m  GENERAL APPEARANCE:  Well appearing, well developed, well nourished, NAD HEENT: Atraumatic, Normocephalic, oropharynx clear. NECK: Supple without lymphadenopathy or thyromegaly. LUNGS: Clear to auscultation bilaterally. HEART: Regular Rate and Rhythm without murmurs, gallops, or rubs. ABDOMEN: Soft, non-tender, No Masses. EXTREMITIES: Moves all extremities well.  Without clubbing, cyanosis, or edema. NEUROLOGIC:  Alert and oriented x 3, normal gait, CN II-XII grossly intact.  MENTAL STATUS:  Appropriate. BACK:  Non-tender to palpation.  No CVAT SKIN:  Warm, dry and intact.    Results: U/A: 6-10 WBCs, moderate bacteria  Results for orders placed or performed in visit on 09/02/21 (from the past 24 hour(s))  BLADDER  SCAN AMB NON-IMAGING   Collection Time: 09/02/21  8:57 AM  Result Value Ref Range   Scan Result 3 ml

## 2021-09-02 NOTE — Progress Notes (Signed)
post void residual=3 ml

## 2021-09-04 LAB — URINE CULTURE

## 2021-09-07 ENCOUNTER — Telehealth: Payer: Self-pay

## 2021-09-07 NOTE — Telephone Encounter (Signed)
Patient called and given results- voiced understanding.

## 2021-09-07 NOTE — Telephone Encounter (Signed)
-----   Message from Primus Bravo, MD sent at 09/07/2021  7:43 AM EST ----- Please notify patient that her urine culture was negative for UTI.  No treatment indicated.

## 2021-09-30 ENCOUNTER — Ambulatory Visit (INDEPENDENT_AMBULATORY_CARE_PROVIDER_SITE_OTHER): Payer: Medicare Other | Admitting: Urology

## 2021-09-30 ENCOUNTER — Encounter: Payer: Self-pay | Admitting: Urology

## 2021-09-30 ENCOUNTER — Other Ambulatory Visit: Payer: Self-pay

## 2021-09-30 VITALS — BP 114/70 | HR 91

## 2021-09-30 DIAGNOSIS — N3946 Mixed incontinence: Secondary | ICD-10-CM | POA: Diagnosis not present

## 2021-09-30 LAB — URINALYSIS, ROUTINE W REFLEX MICROSCOPIC
Bilirubin, UA: NEGATIVE
Glucose, UA: NEGATIVE
Leukocytes,UA: NEGATIVE
Nitrite, UA: NEGATIVE
Protein,UA: NEGATIVE
RBC, UA: NEGATIVE
Specific Gravity, UA: 1.025 (ref 1.005–1.030)
Urobilinogen, Ur: 1 mg/dL (ref 0.2–1.0)
pH, UA: 6 (ref 5.0–7.5)

## 2021-09-30 LAB — BLADDER SCAN AMB NON-IMAGING: Scan Result: 7

## 2021-09-30 MED ORDER — SOLIFENACIN SUCCINATE 10 MG PO TABS: 10.0000 mg | ORAL_TABLET | Freq: Every day | ORAL | 11 refills | Status: DC

## 2021-09-30 NOTE — Progress Notes (Signed)
? ?Assessment: ?1. Mixed incontinence   ? ? ?Plan: ?Continue Kegel exercises ?She does have some vaginal mesh exposure but this is not bothersome for her. ?Trial of Vesicare 10 mg daily.  Rx sent.  Use and side effects discussed. ?Advised patient to call with results of medication in approximately 1 month. ?Discussed potential role of urodynamics for further evaluation of her incontinence if her symptoms not improve. ?Return to office in 2-3 months. ? ?Chief Complaint:  ?Chief Complaint  ?Patient presents with  ? Urinary Incontinence  ? ? ?History of Present Illness: ? ?[name] is a 61 y.o. year old female who is seen for further evaluation of urinary incontinence.  At her initial visit in February 20203, she reported a history of stress incontinence for a number of years.  Within the past several months, she noted onset of urinary frequency, urgency, and nocturia.  She was voiding approximately 10 times during the day and 4 times at night.  She reported urge incontinence and was using 3 poise pads per day.  She noted incontinence both daytime and nighttime. No dysuria or gross hematuria.  No recent UTIs.  No problems with constipation, diarrhea, or fecal incontinence.  She was given a trial of oxybutynin twice daily without improvement in her symptoms.  She took this for approximately 3 weeks.  No other medical therapy. ? ?She is status post an abdominal hysterectomy a number of years ago.  She also underwent a cystocele repair a number of years ago.  She does have a history of back pain, is status post multiple back surgeries, and is currently having sciatica. ? ?Urine culture from 2/23:  <10K colonies ?She was given a trial of Myrbetriq 50 mg daily in 2/23. ? ?She returns today for follow-up.  She noted slight improvement in her urinary symptoms with Myrbetriq.  She continues to have frequency, urgency, nocturia, and urge incontinence.  No dysuria or gross hematuria.  No current UTI  symptoms. ? ?Portions of the above documentation were copied from a prior visit for review purposes only. ? ? ?Past Medical History:  ?Past Medical History:  ?Diagnosis Date  ? Anxiety   ? Arthritis   ? Asthma   ? Cancer [facility])   ? cervical cancer; pt states a long long time ago  ? Depression   ? Diabetes mellitus without complication ([place])   ? Essential (primary) hypertension 06/24/2020  ? Hyperlipemia   ? ? ?Past Surgical History:  ?Past Surgical History:  ?Procedure Laterality Date  ? ABDOMINAL HYSTERECTOMY    ? ANTERIOR LAT LUMBAR FUSION N/A 11/05/2019  ? Procedure: Lumbar Four-Five Anterolateral lumbar interbody fusion with lateral plate;  Surgeon: Kristeen Miss, MD;  Location: [place];  Service: Neurosurgery;  Laterality: N/A;  anterolateral  ? BACK SURGERY    ? pins  ? BACK SURGERY  07/11/2008  ? BIOPSY  06/29/2021  ? Procedure: BIOPSY;  Surgeon: Montez Morita, Quillian Quince, MD;  [facility];  Service: Gastroenterology;;  ? BLADDER REPAIR    ? CARPAL TUNNEL RELEASE Right   ? CESAREAN SECTION  1982, 1984  ? COLONOSCOPY N/A 06/12/2013  ? rehman: normal  ? ESOPHAGOGASTRODUODENOSCOPY (EGD) WITH PROPOFOL N/A 06/29/2021  ? Procedure: ESOPHAGOGASTRODUODENOSCOPY (EGD) WITH PROPOFOL;  Surgeon: Harvel Quale, MD;  [facility];  Service: Gastroenterology;  Laterality: N/A;  8:40  ? KNEE ARTHROSCOPY WITH MEDIAL MENISECTOMY Right 05/09/2019  ? Procedure: KNEE ARTHROSCOPY WITH MEDIAL MENISECTOMY AND LATERAL MENISECTOMY;  Surgeon: Carole Civil,  MD;  [facility];  Service: Orthopedics;  Laterality: Right;  ? KNEE ARTHROSCOPY WITH MEDIAL MENISECTOMY Left 06/27/2019  ? Procedure: KNEE ARTHROSCOPY WITH MEDIAL MENISCECTOMY;  Surgeon: Carole Civil, MD;  [facility];  Service: Orthopedics;  Laterality: Left;  ? ? ?Allergies:  ?No Known Allergies ? ?Family History:  ?Family History  ?Problem Relation Age of Onset  ? Lung cancer Mother   ? Colon cancer Other   ? ? ?Social  History:  ?Social History  ? ?Tobacco Use  ? Smoking status: Former  ?  Packs/day: 1.00  ?  Years: 20.00  ?  Pack years: 20.00  ?  Types: Cigarettes  ? Smokeless tobacco: Never  ? Tobacco comments:  ?  trying to quit  ?Vaping Use  ? Vaping Use: Some days  ?Substance Use Topics  ? Alcohol use: No  ?  Alcohol/week: 0.0 standard drinks  ? Drug use: No  ? ? ?ROS: ?Constitutional:  Negative for fever, chills, weight loss ?CV: Negative for chest pain, previous MI, hypertension ?Respiratory:  Negative for shortness of breath, wheezing, sleep apnea, frequent cough ?GI:  Negative for nausea, vomiting, bloody stool, GERD ? ?Physical exam: ?BP 114/70   Pulse 91  ?GENERAL APPEARANCE:  Well appearing, well developed, well nourished, NAD ?HEENT:  Atraumatic, normocephalic, oropharynx clear ?NECK:  Supple without lymphadenopathy or thyromegaly ?ABDOMEN:  Soft, non-tender, no masses ?EXTREMITIES:  Moves all extremities well, without clubbing, cyanosis, or edema ?NEUROLOGIC:  Alert and oriented x 3, normal gait, CN II-XII grossly intact ?MENTAL STATUS:  appropriate ?BACK:  Non-tender to palpation, No CVAT ?SKIN:  Warm, dry, and intact ?GU: ?Urethra: well supported, no leakage with cough or valsalve ?Vagina: exposed mesh under urethra; grade 2 cystocele ? ? ?Results: ?U/A: 6-10 WBC, >10 epithelial cells, many bacteria, nitrite negative ?Cath U/A negative ? ?I&O cath:  35 ml ? ?

## 2021-10-29 ENCOUNTER — Encounter: Payer: Self-pay | Admitting: Orthopedic Surgery

## 2021-10-29 ENCOUNTER — Ambulatory Visit (INDEPENDENT_AMBULATORY_CARE_PROVIDER_SITE_OTHER): Payer: Medicare Other | Admitting: Orthopedic Surgery

## 2021-10-29 ENCOUNTER — Ambulatory Visit: Payer: Medicare Other

## 2021-10-29 VITALS — Ht 64.0 in | Wt 241.0 lb

## 2021-10-29 DIAGNOSIS — M1712 Unilateral primary osteoarthritis, left knee: Secondary | ICD-10-CM | POA: Diagnosis not present

## 2021-10-29 DIAGNOSIS — G8929 Other chronic pain: Secondary | ICD-10-CM

## 2021-10-29 DIAGNOSIS — M25562 Pain in left knee: Secondary | ICD-10-CM | POA: Diagnosis not present

## 2021-10-29 NOTE — Progress Notes (Signed)
?  FOLLOW UP  ? ?Encounter Diagnosis  ?Name Primary?  ? Chronic pain of left knee Yes  ? ? ? ?Chief Complaint  ?Patient presents with  ? Knee Pain  ?  Bilateral ?Getting worse and having swelling  ? ? ? ?Teresia is still having severe left knee pain  ?She says she has lost 40 lbs however her BMI is still above 40 ? ?Most of her pain is on the medial side of her knee she uses a cane to walk ? ?X-rays today show ? ?X-rays show a moderate varus deformity with sclerosis in the subchondral bone grade 4 arthritis of the medial compartment osteophyte surrounding the joint and some mild patellar tilt ? ?Impression grade 4 arthritis left knee with moderate varus  ? ?She obviously needs to lose a few more pounds I did give her an injection we will have her come back at a reasonable time and recheck her weight and assess her ability to have surgery ? ?Medical problems include chronic opioid management diabetes so we will need to check her hemoglobin A1c ? ?She has had back fusion as well.  Hyperlipidemia and hypertension ?

## 2021-10-29 NOTE — Patient Instructions (Signed)
Continue to hold your sugar down.  The last A1c you told us was 7.1 which is perfect ? ?If you can lose 20 pounds we can replace the knee ? ?You had a cortisone injection it should give you some pain relief but it would be temporary ? ?You really need to have the knee replaced when your weight is appropriate and your hemoglobin A1c remains under 7.5 ? ? ?

## 2021-11-01 NOTE — Progress Notes (Signed)
FOLLOW UP  ?  ?    ?Encounter Diagnosis  ?Name Primary?  ? Chronic pain of left knee Yes  ?  ?  ?  ?    ?Chief Complaint  ?Patient presents with  ? Knee Pain  ?    Bilateral ?Getting worse and having swelling  ?  ?  ?  ?[name] is still having severe left knee pain  ?She says she has lost 40 lbs however her BMI is still above 40 ? ?Most of her pain is on the medial side of her knee she uses a cane to walk ? ?X-rays today show ?  ?X-rays show a moderate varus deformity with sclerosis in the subchondral bone grade 4 arthritis of the medial compartment osteophyte surrounding the joint and some mild patellar tilt ?  ?Impression grade 4 arthritis left knee with moderate varus  ?  ?She obviously needs to lose a few more pounds I did give her an injection we will have her come back at a reasonable time and recheck her weight and assess her ability to have surgery ? ?Medical problems include chronic opioid management diabetes so we will need to check her hemoglobin A1c ? ?She has had back fusion as well.  Hyperlipidemia and hypertension  ?  ? ?

## 2021-11-01 NOTE — Progress Notes (Signed)
[name] had an injection in her left knee after x-rays show that she has progressive arthritis grade 4 her BMI is over 40 and she is not ready for surgery yet see per other notes in the chart for my full progress note ?

## 2021-11-30 ENCOUNTER — Other Ambulatory Visit (HOSPITAL_COMMUNITY): Payer: Self-pay | Admitting: Anesthesiology

## 2021-11-30 ENCOUNTER — Ambulatory Visit (HOSPITAL_COMMUNITY)
Admission: RE | Admit: 2021-11-30 | Discharge: 2021-11-30 | Disposition: A | Discharge status: Home / Self Care | Payer: Medicare Other | Source: Ambulatory Visit | Attending: Anesthesiology | Admitting: Anesthesiology

## 2021-11-30 DIAGNOSIS — M1710 Unilateral primary osteoarthritis, unspecified knee: Secondary | ICD-10-CM | POA: Insufficient documentation

## 2021-12-30 ENCOUNTER — Other Ambulatory Visit: Payer: Self-pay | Admitting: Obstetrics & Gynecology

## 2021-12-31 ENCOUNTER — Other Ambulatory Visit: Payer: Self-pay | Admitting: Urology

## 2022-01-03 ENCOUNTER — Ambulatory Visit: Payer: Medicare Other | Admitting: Urology

## 2022-01-03 NOTE — Progress Notes (Deleted)
Assessment: 1. Mixed incontinence      Plan: Continue Kegel exercises She does have some vaginal mesh exposure but this is not bothersome for her. Trial of Vesicare 10 mg daily.  Rx sent.  Use and side effects discussed. Advised patient to call with results of medication in approximately 1 month. Discussed potential role of urodynamics for further evaluation of her incontinence if her symptoms not improve. Return to office in 2-3 months.  Chief Complaint:  No chief complaint on file.   History of Present Illness:  [name] is a 61 y.o. year old female who is seen for further evaluation of urinary incontinence.   At her initial visit in February 20203, she reported a history of stress incontinence for a number of years.  Within the past several months, she noted onset of urinary frequency, urgency, and nocturia.  She was voiding approximately 10 times during the day and 4 times at night.  She reported urge incontinence and was using 3 poise pads per day.  She noted incontinence both daytime and nighttime. No dysuria or gross hematuria.  No recent UTIs.  No problems with constipation, diarrhea, or fecal incontinence.  She was given a trial of oxybutynin twice daily without improvement in her symptoms.  She took this for approximately 3 weeks.  No other medical therapy.  She is status post an abdominal hysterectomy a number of years ago.  She also underwent a cystocele repair a number of years ago.  She does have a history of back pain, is status post multiple back surgeries, and is currently having sciatica.  Urine culture from 2/23:  <10K colonies She was given a trial of Myrbetriq 50 mg daily in 2/23. At her visit in 3/23, she reported a slight improvement in her urinary symptoms with Myrbetriq.  She continued to have frequency, urgency, nocturia, and urge incontinence.  No dysuria or gross hematuria.  No current UTI symptoms. Vaginal exam demonstrated good support of urethra  and bladder, vaginal mesh exposure. She was given a trial of Vesicare 10 mg daily.   Portions of the above documentation were copied from a prior visit for review purposes only.   Past Medical History:  Past Medical History:  Diagnosis Date   Anxiety    Arthritis    Asthma    Cancer ([place])    cervical cancer; pt states a long long time ago   Depression    Diabetes mellitus without complication ([place])    Essential (primary) hypertension 06/24/2020   Hyperlipemia     Past Surgical History:  Past Surgical History:  Procedure Laterality Date   ABDOMINAL HYSTERECTOMY     ANTERIOR LAT LUMBAR FUSION N/A 11/05/2019   Procedure: Lumbar Four-Five Anterolateral lumbar interbody fusion with lateral plate;  Surgeon: Kristeen Miss, MD;  Location: [place];  Service: Neurosurgery;  Laterality: N/A;  anterolateral   BACK SURGERY     pins   BACK SURGERY  07/11/2008   BIOPSY  06/29/2021   Procedure: BIOPSY;  Surgeon: Harvel Quale, MD;  [facility];  Service: Gastroenterology;;   BLADDER REPAIR     CARPAL TUNNEL RELEASE Right    [place]   COLONOSCOPY N/A 06/12/2013   rehman: normal   ESOPHAGOGASTRODUODENOSCOPY (EGD) WITH PROPOFOL N/A 06/29/2021   Procedure: ESOPHAGOGASTRODUODENOSCOPY (EGD) WITH PROPOFOL;  Surgeon: Harvel Quale, MD;  [facility];  Service: Gastroenterology;  Laterality: N/A;  8:40   KNEE ARTHROSCOPY WITH MEDIAL MENISECTOMY  Right 05/09/2019   Procedure: KNEE ARTHROSCOPY WITH MEDIAL MENISECTOMY AND LATERAL MENISECTOMY;  Surgeon: Carole Civil, MD;  [facility];  Service: Orthopedics;  Laterality: Right;   KNEE ARTHROSCOPY WITH MEDIAL MENISECTOMY Left 06/27/2019   Procedure: KNEE ARTHROSCOPY WITH MEDIAL MENISCECTOMY;  Surgeon: Carole Civil, MD;  [facility];  Service: Orthopedics;  Laterality: Left;    Allergies:  No Known Allergies  Family History:  Family History  Problem Relation  Age of Onset   Lung cancer Mother    Colon cancer Other     Social History:  Social History   Tobacco Use   Smoking status: Former    Packs/day: 1.00    Years: 20.00    Total pack years: 20.00    Types: Cigarettes   Smokeless tobacco: Never   Tobacco comments:    trying to quit  Vaping Use   Vaping Use: Some days  Substance Use Topics   Alcohol use: No    Alcohol/week: 0.0 standard drinks of alcohol   Drug use: No    ROS: Constitutional:  Negative for fever, chills, weight loss CV: Negative for chest pain, previous MI, hypertension Respiratory:  Negative for shortness of breath, wheezing, sleep apnea, frequent cough GI:  Negative for nausea, vomiting, bloody stool, GERD  Physical exam: There were no vitals taken for this visit. GENERAL APPEARANCE:  Well appearing, well developed, well nourished, NAD HEENT:  Atraumatic, normocephalic, oropharynx clear NECK:  Supple without lymphadenopathy or thyromegaly ABDOMEN:  Soft, non-tender, no masses EXTREMITIES:  Moves all extremities well, without clubbing, cyanosis, or edema NEUROLOGIC:  Alert and oriented x 3, normal gait, CN II-XII grossly intact MENTAL STATUS:  appropriate BACK:  Non-tender to palpation, No CVAT SKIN:  Warm, dry, and intact   Results: U/A:

## 2022-01-07 ENCOUNTER — Telehealth: Payer: Self-pay

## 2022-01-07 NOTE — Telephone Encounter (Signed)
The pharmacy called aboout her [name] and they need an auth so they can fill it. She stated that she called for it last week as well.

## 2022-01-25 ENCOUNTER — Other Ambulatory Visit: Payer: Self-pay | Admitting: Obstetrics & Gynecology

## 2022-02-15 DIAGNOSIS — Z9889 Other specified postprocedural states: Secondary | ICD-10-CM | POA: Insufficient documentation

## 2022-02-16 ENCOUNTER — Encounter: Payer: Self-pay | Admitting: Orthopedic Surgery

## 2022-02-16 ENCOUNTER — Ambulatory Visit (INDEPENDENT_AMBULATORY_CARE_PROVIDER_SITE_OTHER): Payer: Medicare Other | Admitting: Orthopedic Surgery

## 2022-02-16 VITALS — Ht 65.0 in | Wt 258.0 lb

## 2022-02-16 DIAGNOSIS — M1712 Unilateral primary osteoarthritis, left knee: Secondary | ICD-10-CM | POA: Diagnosis not present

## 2022-02-16 DIAGNOSIS — I10 Essential (primary) hypertension: Secondary | ICD-10-CM | POA: Diagnosis not present

## 2022-02-16 DIAGNOSIS — Z01818 Encounter for other preprocedural examination: Secondary | ICD-10-CM | POA: Diagnosis not present

## 2022-02-16 MED ORDER — BUPIVACAINE-MELOXICAM ER 200-6 MG/7ML IJ SOLN
400.0000 mg | Freq: Once | INTRAMUSCULAR | Status: DC
Start: 2022-02-16 — End: 2022-03-16

## 2022-02-16 NOTE — Progress Notes (Signed)
Chief Complaint  Patient presents with   Knee Pain    Wants left knee replaced     Ht '5\' 5"'$  (1.651 m)   Wt 258 lb (117 kg)   BMI 42.93 kg/m   HGA1c +  ???  Recent A1c was 6.5  Patient's weight is 235 at home  BMI less than 40 by her weight  Patient interested in total knee  Past history reviewed  Risk factors include diabetes and obesity  Left knee range of motion 5-115 pain tenderness diffusely about the joint  Prior x-rays show arthritis  with mild varus  Patient will proceed with knee replacement also note patient is on chronic opioid therapy.  Decision today has been made to proceed with left total knee arthroplasty with risk factors of diabetes  Past Medical History:  Diagnosis Date   Anxiety    Arthritis    Asthma    Cancer ([place])    cervical cancer; pt states a long long time ago   Depression    Diabetes mellitus without complication ([place])    Essential (primary) hypertension 06/24/2020   Hyperlipemia    Past Surgical History:  Procedure Laterality Date   ABDOMINAL HYSTERECTOMY     ANTERIOR LAT LUMBAR FUSION N/A 11/05/2019   Procedure: Lumbar Four-Five Anterolateral lumbar interbody fusion with lateral plate;  Surgeon: Kristeen Miss, MD;  Location: [place];  Service: Neurosurgery;  Laterality: N/A;  anterolateral   BACK SURGERY     pins   BACK SURGERY  07/11/2008   BIOPSY  06/29/2021   Procedure: BIOPSY;  Surgeon: Harvel Quale, MD;  [facility];  Service: Gastroenterology;;   BLADDER REPAIR     CARPAL TUNNEL RELEASE Right    [place]   COLONOSCOPY N/A 06/12/2013   rehman: normal   ESOPHAGOGASTRODUODENOSCOPY (EGD) WITH PROPOFOL N/A 06/29/2021   Procedure: ESOPHAGOGASTRODUODENOSCOPY (EGD) WITH PROPOFOL;  Surgeon: Harvel Quale, MD;  [facility];  Service: Gastroenterology;  Laterality: N/A;  8:40   KNEE ARTHROSCOPY WITH MEDIAL MENISECTOMY Right 05/09/2019   Procedure: KNEE ARTHROSCOPY  WITH MEDIAL MENISECTOMY AND LATERAL MENISECTOMY;  Surgeon: Carole Civil, MD;  [facility];  Service: Orthopedics;  Laterality: Right;   KNEE ARTHROSCOPY WITH MEDIAL MENISECTOMY Left 06/27/2019   Procedure: KNEE ARTHROSCOPY WITH MEDIAL MENISCECTOMY;  Surgeon: Carole Civil, MD;  [facility];  Service: Orthopedics;  Laterality: Left;   Family History  Problem Relation Age of Onset   Lung cancer Mother    Colon cancer Other    Current Outpatient Medications  Medication Instructions   acetaminophen (TYLENOL) 1,000 mg, Oral, Every 6 hours PRN   ARIPiprazole (ABILIFY) 2 mg, Oral, Daily   EPINEPHrine (PRIMATENE MIST IN) 1 puff, Inhalation, Daily PRN   estradiol (ESTRACE) 2 MG tablet TAKE ONE TABLET AT BEDTIME   FLUoxetine (PROZAC) 40 mg, Oral, Daily   gabapentin (NEURONTIN) 300 mg, Oral, 3 times daily   loratadine (CLARITIN) 10 mg, Oral, Daily   metFORMIN (GLUCOPHAGE) 500 mg, Oral, 2 times daily with meals   Multiple Vitamin (MULTIVITAMIN WITH MINERALS) TABS tablet 1 tablet, Oral, Daily   omeprazole (PRILOSEC) 40 mg, Oral, Daily   oxyCODONE (OXY IR/ROXICODONE) 5-10 mg, Oral, Every  3 hours PRN   phentermine (ADIPEX-P) 37.5 mg, Oral, Daily   pravastatin (PRAVACHOL) 40 mg, Daily at bedtime   solifenacin (VESICARE) 10 mg, Oral, Daily   traZODone (DESYREL) 200 mg, Oral, Daily at bedtime   Vitamin D3 2,000  Units, Oral, Daily

## 2022-02-16 NOTE — Addendum Note (Signed)
Addended byCandice Camp on: 02/16/2022 03:48 PM   Modules accepted: Orders

## 2022-02-16 NOTE — Addendum Note (Signed)
Addended byCandice Camp on: 02/16/2022 11:50 AM   Modules accepted: Orders

## 2022-02-16 NOTE — Patient Instructions (Addendum)
   You will also get a call from a representative of [employer], they have a machine that you will use in the first few weeks after surgery. It is called a CPM.   You will have home physical therapy for 2 weeks after surgery, the home health agency is [facility],  You need physical therapy scheduled for 2 weeks after the surgery also, call [facility] health to schedule the number is [phone]  If you have questions or need to Reschedule the surgery, call the office ask for Suzanne Kho.    Your surgery will be at [facility] by Dr Aline Brochure  The hospital will contact you with a preoperative appointment to discuss Anesthesia.  Please arrive on time or 15 minutes early for the preoperative appointment, they have a very tight schedule if you are late or do not come in your surgery will be cancelled.  The phone number is [phone]. Please bring your medications with you for the appointment. They will tell you the arrival time and medication instructions when you have your preoperative evaluation. Do not wear nail polish the day of your surgery and if you take Phentermine you need to stop this medication ONE WEEK prior to your surgery. Please arrive at the hospital 2 hours before procedure if scheduled at 9:30 or later in the day or at the time the nurse tells you at your preoperative visit.   If you have my chart do not use the time given in my chart use the time given to you by the nurse during your preoperative visit.   Your surgery  time may change. Please be available for phone calls the day of your surgery and the day before. The Short Stay department may need to discuss changes about your surgery time. Not reaching the you could lead to procedure delays and possible cancellation.  You must have a ride home and someone to stay with you for 24 to 48 hours. The person taking you home will receive and sign for the your discharge instructions.  Please be prepared to give your support person's name and  telephone number to [employer]. Dr Aline Brochure will need that name and phone number post procedure.

## 2022-02-23 ENCOUNTER — Other Ambulatory Visit: Payer: Self-pay | Admitting: Orthopedic Surgery

## 2022-02-23 DIAGNOSIS — M1712 Unilateral primary osteoarthritis, left knee: Secondary | ICD-10-CM

## 2022-03-08 NOTE — Patient Instructions (Signed)
Susan VANDERHEIDEN  03/08/2022     '@PREFPERIOPPHARMACY'$ @   Your procedure is scheduled on  03/15/2022.   Report to [facility] at  [facility]   Call this number if you have problems the morning of surgery:  [phone]   Remember:  Do not eat or drink after midnight.         Use your inhaler before you come and bring your rescue inhaler with you.      DO NOT take any medications for diabetes the morning of your procedure.     Take these medicines the morning of surgery with A SIP OF WATER           abilify, prozac, neurontin, prilosec, percocet(if needed), vesicare.     Do not wear jewelry, make-up or nail polish.  Do not wear lotions, powders, or perfumes, or deodorant.  Do not shave 48 hours prior to surgery.  Men may shave face and neck.  Do not bring valuables to the hospital.  [facility] is not responsible for any belongings or valuables.  Contacts, dentures or bridgework may not be worn into surgery.  Leave your suitcase in the car.  After surgery it may be brought to your room.  For patients admitted to the hospital, discharge time will be determined by your treatment team.  Patients discharged the day of surgery will not be allowed to drive home and must have someone with them for 24 hours.     Special instructions:   DO NOT smoke tobacco or vape for 24 hours before your procedure.   Please read over the following fact sheets that you were given. Pain Booklet, Coughing and Deep Breathing, Blood Transfusion Information, Total Joint Packet, MRSA Information, Surgical Site Infection Prevention, Anesthesia Post-op Instructions, and Care and Recovery After Surgery      Total Knee Replacement, Care After This sheet gives you information about how to care for yourself after your procedure. Your health care provider may also give you more specific instructions. If you have problems or questions, contact your health care provider. What can I expect after  the procedure? After the procedure, it is common to have: Redness, pain, and swelling at the incision area. Stiffness. Discomfort. A small amount of blood or clear fluid coming from your incision. Follow these instructions at home: Medicines Take over-the-counter and prescription medicines only as told by your health care provider. If you were prescribed a blood thinner (anticoagulant), take it as told by your health care provider. Ask your health care provider if the medicine prescribed to you: Requires you to avoid driving or using machinery. Can cause constipation. You may need to take these actions to prevent or treat constipation: Drink enough fluid to keep your urine pale yellow. Take over-the-counter or prescription medicines. Eat foods that are high in fiber, such as beans, whole grains, and fresh fruits and vegetables. Limit foods that are high in fat and processed sugars, such as fried or sweet foods. Incision care  Follow instructions from your health care provider about how to take care of your incision. Make sure you: Wash your hands with soap and water for at least 20 seconds before and after you change your bandage (dressing). If soap and water are not available, use hand sanitizer. Change your dressing as told by your health care provider. Leave stitches (sutures), staples, skin glue, or adhesive strips in place. These skin closures may need to stay in place  for 2 weeks or longer. If adhesive strip edges start to loosen and curl up, you may trim the loose edges. Do not remove adhesive strips completely unless your health care provider tells you to do that. Do not take baths, swim, or use a hot tub until your health care provider approves. Check your incision area every day for signs of infection. Check for: More redness, swelling, or pain. More fluid or blood. Warmth. Pus or a bad smell. Activity Rest as told by your health care provider. Avoid sitting for a long time  without moving. Get up to take short walks every 1-2 hours. This is important to improve blood flow and breathing. Ask for help if you feel weak or unsteady. Follow instructions from your health care provider about using a walker, crutches, or a cane. You may use your legs to support (bear) your body weight as told by your health care provider. Follow instructions about how much weight you may safely support on your affected leg (weight-bearing restrictions). A physical therapist may show you how to get out of a bed and chair and how to go up and down stairs. You will first do this with a walker, crutches, or a cane and then without any of these devices. Once you are able to walk without a limp, you may stop using a walker, crutches, or a cane. Do exercises as told by your health care provider or physical therapist. Avoid high-impact activities, including running, jumping rope, and doing jumping jacks. Do not play contact sports until your health care provider approves. Return to your normal activities as told by your health care provider. Ask your health care provider what activities are safe for you. Managing pain, stiffness, and swelling  If directed, put ice on your knee. To do this: Put ice in a plastic bag or use the icing device (cold flow pad) that you were given. Follow instructions from your health care provider about how to use the icing device. Place a towel between your skin and the bag or between your skin and the icing device. Leave the ice on for 20 minutes, 2-3 times a day. Remove the ice if your skin turns bright red. This is very important. If you cannot feel pain, heat, or cold, you have a greater risk of damage to the area. Move your toes often to reduce stiffness and swelling. Raise (elevate) your leg above the level of your heart while you are sitting or lying down. Use several pillows to keep your leg straight. Do not put a pillow just under the knee. If the knee is bent  for a long time, this may lead to stiffness. Wear elastic knee support as told by your health care provider. Safety  To help prevent falls, keep floors clear of objects you may trip over. Place items that you may need within easy reach. Wear an apron or tool belt with pockets for carrying objects. This leaves your hands free to help with your balance. Ask your health care provider when it is safe to drive. General instructions Wear compression stockings as told by your health care provider. These stockings help to prevent blood clots and reduce swelling in your legs. Continue with breathing exercises. This helps prevent lung infection. Do not use any products that contain nicotine or tobacco. These products include cigarettes, chewing tobacco, and vaping devices, such as e-cigarettes. These can delay healing after surgery. If you need help quitting, ask your health care provider. Tell your health care  provider if you plan to have dental work. Also: Tell your dentist about your joint replacement. Ask your health care provider if there are any special instructions you need to follow before having dental care and routine cleanings. Keep all follow-up visits. This is important. Contact a health care provider if: You have a fever or chills. You have a cough or feel short of breath. Your medicine is not controlling your pain. You have any of these signs of infection: More redness, swelling, or pain around your incision. More fluid or blood coming from your incision. Warmth coming from your incision. Pus or a bad smell coming from your incision. You fall. Get help right away if: You have severe pain. You have trouble breathing. You have chest pain. You have redness, swelling, pain, or warmth in your calf or leg. Your incision breaks open after sutures or staples are removed. These symptoms may represent a serious problem that is an emergency. Do not wait to see if the symptoms will go away.  Get medical help right away. Call your local emergency services (911 in the [place] Do not drive yourself to the hospital. Summary After the procedure, it is common to have pain and swelling at the incision area, a small amount of blood or fluid coming from your incision, and stiffness. Follow instructions from your health care provider about how to take care of your incision. Use crutches, a walker, or a cane as told by your health care provider. This information is not intended to replace advice given to you by your health care provider. Make sure you discuss any questions you have with your health care provider. Document Revised: 12/17/2019 Document Reviewed: 12/17/2019 Elsevier Patient Education  [place] Anesthesia, Adult, Care After The following information offers guidance on how to care for yourself after your procedure. Your health care provider may also give you more specific instructions. If you have problems or questions, contact your health care provider. What can I expect after the procedure? After the procedure, it is common for people to: Have pain or discomfort at the IV site. Have nausea or vomiting. Have a sore throat or hoarseness. Have trouble concentrating. Feel cold or chills. Feel weak, sleepy, or tired (fatigue). Have soreness and body aches. These can affect parts of the body that were not involved in surgery. Follow these instructions at home: For the time period you were told by your health care provider:  Rest. Do not participate in activities where you could fall or become injured. Do not drive or use machinery. Do not drink alcohol. Do not take sleeping pills or medicines that cause drowsiness. Do not make important decisions or sign legal documents. Do not take care of children on your own. General instructions Drink enough fluid to keep your urine pale yellow. If you have sleep apnea, surgery and certain medicines can increase your  risk for breathing problems. Follow instructions from your health care provider about wearing your sleep device: Anytime you are sleeping, including during daytime naps. While taking prescription pain medicines, sleeping medicines, or medicines that make you drowsy. Return to your normal activities as told by your health care provider. Ask your health care provider what activities are safe for you. Take over-the-counter and prescription medicines only as told by your health care provider. Do not use any products that contain nicotine or tobacco. These products include cigarettes, chewing tobacco, and vaping devices, such as e-cigarettes. These can delay incision healing after surgery. If you need  help quitting, ask your health care provider. Contact a health care provider if: You have nausea or vomiting that does not get better with medicine. You vomit every time you eat or drink. You have pain that does not get better with medicine. You cannot urinate or have bloody urine. You develop a skin rash. You have a fever. Get help right away if: You have trouble breathing. You have chest pain. You vomit blood. These symptoms may be an emergency. Get help right away. Call 911. Do not wait to see if the symptoms will go away. Do not drive yourself to the hospital. Summary After the procedure, it is common to have a sore throat, hoarseness, nausea, vomiting, or to feel weak, sleepy, or fatigue. For the time period you were told by your health care provider, do not drive or use machinery. Get help right away if you have difficulty breathing, have chest pain, or vomit blood. These symptoms may be an emergency. This information is not intended to replace advice given to you by your health care provider. Make sure you discuss any questions you have with your health care provider. Document Revised: 09/24/2021 Document Reviewed: 09/24/2021 Elsevier Patient Education  [place]. How to Use  Chlorhexidine Before Surgery Chlorhexidine gluconate (CHG) is a germ-killing (antiseptic) solution that is used to clean the skin. It can get rid of the bacteria that normally live on the skin and can keep them away for about 24 hours. To clean your skin with CHG, you may be given: A CHG solution to use in the shower or as part of a sponge bath. A prepackaged cloth that contains CHG. Cleaning your skin with CHG may help lower the risk for infection: While you are staying in the intensive care unit of the hospital. If you have a vascular access, such as a central line, to provide short-term or long-term access to your veins. If you have a catheter to drain urine from your bladder. If you are on a ventilator. A ventilator is a machine that helps you breathe by moving air in and out of your lungs. After surgery. What are the risks? Risks of using CHG include: A skin reaction. Hearing loss, if CHG gets in your ears and you have a perforated eardrum. Eye injury, if CHG gets in your eyes and is not rinsed out. The CHG product catching fire. Make sure that you avoid smoking and flames after applying CHG to your skin. Do not use CHG: If you have a chlorhexidine allergy or have previously reacted to chlorhexidine. On babies younger than 51 months of age. How to use CHG solution Use CHG only as told by your health care provider, and follow the instructions on the label. Use the full amount of CHG as directed. Usually, this is one bottle. During a shower Follow these steps when using CHG solution during a shower (unless your health care provider gives you different instructions): Start the shower. Use your normal soap and shampoo to wash your face and hair. Turn off the shower or move out of the shower stream. Pour the CHG onto a clean washcloth. Do not use any type of brush or rough-edged sponge. Starting at your neck, lather your body down to your toes. Make sure you follow these instructions: If  you will be having surgery, pay special attention to the part of your body where you will be having surgery. Scrub this area for at least 1 minute. Do not use CHG on your head  or face. If the solution gets into your ears or eyes, rinse them well with water. Avoid your genital area. Avoid any areas of skin that have broken skin, cuts, or scrapes. Scrub your back and under your arms. Make sure to wash skin folds. Let the lather sit on your skin for 1-2 minutes or as long as told by your health care provider. Thoroughly rinse your entire body in the shower. Make sure that all body creases and crevices are rinsed well. Dry off with a clean towel. Do not put any substances on your body afterward--such as powder, lotion, or perfume--unless you are told to do so by your health care provider. Only use lotions that are recommended by the manufacturer. Put on clean clothes or pajamas. If it is the night before your surgery, sleep in clean sheets.  During a sponge bath Follow these steps when using CHG solution during a sponge bath (unless your health care provider gives you different instructions): Use your normal soap and shampoo to wash your face and hair. Pour the CHG onto a clean washcloth. Starting at your neck, lather your body down to your toes. Make sure you follow these instructions: If you will be having surgery, pay special attention to the part of your body where you will be having surgery. Scrub this area for at least 1 minute. Do not use CHG on your head or face. If the solution gets into your ears or eyes, rinse them well with water. Avoid your genital area. Avoid any areas of skin that have broken skin, cuts, or scrapes. Scrub your back and under your arms. Make sure to wash skin folds. Let the lather sit on your skin for 1-2 minutes or as long as told by your health care provider. Using a different clean, wet washcloth, thoroughly rinse your entire body. Make sure that all body creases  and crevices are rinsed well. Dry off with a clean towel. Do not put any substances on your body afterward--such as powder, lotion, or perfume--unless you are told to do so by your health care provider. Only use lotions that are recommended by the manufacturer. Put on clean clothes or pajamas. If it is the night before your surgery, sleep in clean sheets. How to use CHG prepackaged cloths Only use CHG cloths as told by your health care provider, and follow the instructions on the label. Use the CHG cloth on clean, dry skin. Do not use the CHG cloth on your head or face unless your health care provider tells you to. When washing with the CHG cloth: Avoid your genital area. Avoid any areas of skin that have broken skin, cuts, or scrapes. Before surgery Follow these steps when using a CHG cloth to clean before surgery (unless your health care provider gives you different instructions): Using the CHG cloth, vigorously scrub the part of your body where you will be having surgery. Scrub using a back-and-forth motion for 3 minutes. The area on your body should be completely wet with CHG when you are done scrubbing. Do not rinse. Discard the cloth and let the area air-dry. Do not put any substances on the area afterward, such as powder, lotion, or perfume. Put on clean clothes or pajamas. If it is the night before your surgery, sleep in clean sheets.  For general bathing Follow these steps when using CHG cloths for general bathing (unless your health care provider gives you different instructions). Use a separate CHG cloth for each area of your body.  Make sure you wash between any folds of skin and between your fingers and toes. Wash your body in the following order, switching to a new cloth after each step: The front of your neck, shoulders, and chest. Both of your arms, under your arms, and your hands. Your stomach and groin area, avoiding the genitals. Your right leg and foot. Your left leg and  foot. The back of your neck, your back, and your buttocks. Do not rinse. Discard the cloth and let the area air-dry. Do not put any substances on your body afterward--such as powder, lotion, or perfume--unless you are told to do so by your health care provider. Only use lotions that are recommended by the manufacturer. Put on clean clothes or pajamas. Contact a health care provider if: Your skin gets irritated after scrubbing. You have questions about using your solution or cloth. You swallow any chlorhexidine. Call your local poison control center (1-(305) 622-7433 in the [place] Get help right away if: Your eyes itch badly, or they become very red or swollen. Your skin itches badly and is red or swollen. Your hearing changes. You have trouble seeing. You have swelling or tingling in your mouth or throat. You have trouble breathing. These symptoms may represent a serious problem that is an emergency. Do not wait to see if the symptoms will go away. Get medical help right away. Call your local emergency services (911 in the [place] Do not drive yourself to the hospital. Summary Chlorhexidine gluconate (CHG) is a germ-killing (antiseptic) solution that is used to clean the skin. Cleaning your skin with CHG may help to lower your risk for infection. You may be given CHG to use for bathing. It may be in a bottle or in a prepackaged cloth to use on your skin. Carefully follow your health care provider's instructions and the instructions on the product label. Do not use CHG if you have a chlorhexidine allergy. Contact your health care provider if your skin gets irritated after scrubbing. This information is not intended to replace advice given to you by your health care provider. Make sure you discuss any questions you have with your health care provider. Document Revised: 10/25/2021 Document Reviewed: 09/07/2020 Elsevier Patient Education  [place].

## 2022-03-09 ENCOUNTER — Other Ambulatory Visit: Payer: Self-pay | Admitting: Orthopedic Surgery

## 2022-03-10 ENCOUNTER — Encounter (HOSPITAL_COMMUNITY)
Admission: RE | Admit: 2022-03-10 | Discharge: 2022-03-10 | Disposition: A | Discharge status: Home / Self Care | Payer: Medicare Other | Source: Ambulatory Visit | Attending: Orthopedic Surgery | Admitting: Orthopedic Surgery

## 2022-03-10 VITALS — BP 130/75 | HR 75 | Temp 97.7°F | Resp 18 | Ht 65.0 in | Wt 257.9 lb

## 2022-03-10 DIAGNOSIS — Z01818 Encounter for other preprocedural examination: Secondary | ICD-10-CM | POA: Insufficient documentation

## 2022-03-10 DIAGNOSIS — I10 Essential (primary) hypertension: Secondary | ICD-10-CM | POA: Diagnosis not present

## 2022-03-10 DIAGNOSIS — E119 Type 2 diabetes mellitus without complications: Secondary | ICD-10-CM | POA: Diagnosis not present

## 2022-03-10 DIAGNOSIS — M1712 Unilateral primary osteoarthritis, left knee: Secondary | ICD-10-CM | POA: Insufficient documentation

## 2022-03-10 LAB — BASIC METABOLIC PANEL
Anion gap: 10 (ref 5–15)
BUN: 7 mg/dL (ref 6–20)
CO2: 26 mmol/L (ref 22–32)
Calcium: 9.1 mg/dL (ref 8.9–10.3)
Chloride: 99 mmol/L (ref 98–111)
Creatinine, Ser: 0.66 mg/dL (ref 0.44–1.00)
GFR, Estimated: >60 mL/min (ref >60)
Glucose, Bld: 133 mg/dL — ABNORMAL HIGH (ref 70–99)
Potassium: 3.1 mmol/L — ABNORMAL LOW (ref 3.5–5.1)
Sodium: 135 mmol/L (ref 135–145)

## 2022-03-10 LAB — CBC WITH DIFFERENTIAL/PLATELET
Abs Immature Granulocytes: 0.06 10*3/uL (ref 0.00–0.07)
Basophils Absolute: 0.1 10*3/uL (ref 0.0–0.1)
Basophils Relative: 1 %
Eosinophils Absolute: 0.2 10*3/uL (ref 0.0–0.5)
Eosinophils Relative: 2 %
HCT: 40.9 % (ref 36.0–46.0)
Hemoglobin: 13.7 g/dL (ref 12.0–15.0)
Immature Granulocytes: 1 %
Lymphocytes Relative: 17 %
Lymphs Abs: 1.7 10*3/uL (ref 0.7–4.0)
MCH: 30.7 pg (ref 26.0–34.0)
MCHC: 33.5 g/dL (ref 30.0–36.0)
MCV: 91.7 fL (ref 80.0–100.0)
Monocytes Absolute: 0.7 10*3/uL (ref 0.1–1.0)
Monocytes Relative: 6 %
Neutro Abs: 7.6 10*3/uL (ref 1.7–7.7)
Neutrophils Relative %: 73 %
Platelets: 246 10*3/uL (ref 150–400)
RBC: 4.46 MIL/uL (ref 3.87–5.11)
RDW: 13.9 % (ref 11.5–15.5)
WBC: 10.3 10*3/uL (ref 4.0–10.5)
nRBC: 0 % (ref 0.0–0.2)

## 2022-03-10 LAB — PREPARE RBC (CROSSMATCH)

## 2022-03-10 LAB — HEMOGLOBIN A1C
Hgb A1c MFr Bld: 6.8 % — ABNORMAL HIGH (ref 4.8–5.6)
Mean Plasma Glucose: 148.46 mg/dL

## 2022-03-10 LAB — TYPE AND SCREEN
ABO/RH(D): O POS
Antibody Screen: NEGATIVE

## 2022-03-10 LAB — SURGICAL PCR SCREEN
MRSA, PCR: NEGATIVE
Staphylococcus aureus: NEGATIVE

## 2022-03-11 ENCOUNTER — Other Ambulatory Visit: Payer: Self-pay | Admitting: Orthopedic Surgery

## 2022-03-11 DIAGNOSIS — E876 Hypokalemia: Secondary | ICD-10-CM

## 2022-03-11 MED ORDER — POTASSIUM CHLORIDE CRYS ER 20 MEQ PO TBCR: 20.0000 meq | EXTENDED_RELEASE_TABLET | Freq: Two times a day (BID) | ORAL | 0 refills | Status: DC

## 2022-03-11 NOTE — Progress Notes (Signed)
Encounter Diagnosis  Name Primary?   Hypokalemia Yes   Meds ordered this encounter  Medications   potassium chloride SA (KLOR-CON M) 20 MEQ tablet    Sig: Take 1 tablet (20 mEq total) by mouth 2 (two) times daily.    Dispense:  8 tablet    Refill:  0

## 2022-03-14 NOTE — H&P (Signed)
ADMIT HISTORY AND PHYSICAL    Chief Complaint  Patient presents with   Knee Pain - LEFT       Wants left knee replaced       Ht '5\' 5"'$  (1.651 m)   Wt 258 lb (117 kg)   BMI 42.93 kg/m    HGA1c +  ???  Recent A1c was 6.5  Patient's weight is 235 at home  BMI less than 40 by her weight  Patient interested in left  total knee  Past history reviewed  Risk factors include diabetes and obesity  Constitutional: Vital signs stable General appearance OBESITY/BMI CLOSE TO 40   Cardiovascular: MILD swelling, varicosities palpation of pulses INTACT temperature NORMAL  MILD edema MILD tenderness  Lymph nodes NORMAL   Skin CLEAN   Neuro coordination NORMAL , deep tendon reflexes NORMAL, NORMAL sensation  Psych alert and oriented X 3, NO depression anxiety OR agitation  Musculoskeletal gait ABNORMAL NEEDS SUPPORT  LEFT KNEE:Inspection and palpation, range of motion, stability, strength INSPECTION: MEDIAL JOINT TENDERNESS, VARUS ALIGNED KNEE  ROM 5-115 THE KNEE IS STABLE  EXTENSOR MECH IS INTACT WITH GOOD STRENGTH   Prior x-rays show SEVERE arthritis  with mild varus  Patient will proceed with LEFT TOTAL KNEE  replacement also note patient is on chronic opioid therapy.   Decision today has been made to proceed with left total knee arthroplasty with risk factors of diabetes       Past Medical History:  Diagnosis Date   Anxiety     Arthritis     Asthma     Cancer ([place])      cervical cancer; pt states a long long time ago   Depression     Diabetes mellitus without complication ([place]     Essential (primary) hypertension 06/24/2020   Hyperlipemia           Past Surgical History:  Procedure Laterality Date   ABDOMINAL HYSTERECTOMY       ANTERIOR LAT LUMBAR FUSION N/A 11/05/2019    Procedure: Lumbar Four-Five Anterolateral lumbar interbody fusion with lateral plate;  Surgeon: Kristeen Miss, MD;  Location: [facility];  Service: Neurosurgery;  Laterality: N/A;  anterolateral    BACK SURGERY        pins   BACK SURGERY   07/11/2008   BIOPSY   06/29/2021    Procedure: BIOPSY;  Surgeon: Harvel Quale, MD;  Location: [facility];  Service: Gastroenterology;;   BLADDER REPAIR       CARPAL TUNNEL RELEASE Right     [facility]   COLONOSCOPY N/A 06/12/2013    [facility]: normal   ESOPHAGOGASTRODUODENOSCOPY (EGD) WITH PROPOFOL N/A 06/29/2021    Procedure: ESOPHAGOGASTRODUODENOSCOPY (EGD) WITH PROPOFOL;  Surgeon: Harvel Quale, MD;  Location: [facility];  Service: Gastroenterology;  Laterality: N/A;  8:40   KNEE ARTHROSCOPY WITH MEDIAL MENISECTOMY Right 05/09/2019    Procedure: KNEE ARTHROSCOPY WITH MEDIAL MENISECTOMY AND LATERAL MENISECTOMY;  Surgeon: Carole Civil, MD;  Location: [facility];  Service: Orthopedics;  Laterality: Right;   KNEE ARTHROSCOPY WITH MEDIAL MENISECTOMY Left 06/27/2019    Procedure: KNEE ARTHROSCOPY WITH MEDIAL MENISCECTOMY;  Surgeon: Carole Civil, MD;  Location: [facility];  Service: Orthopedics;  Laterality: Left;         Family History  Problem Relation Age of Onset   Lung cancer Mother     Colon cancer Other          Current Outpatient Medications  Medication Instructions   acetaminophen (TYLENOL) 1,000 mg, Oral, Every 6 hours PRN   ARIPiprazole (ABILIFY) 2 mg, Oral, Daily   EPINEPHrine (PRIMATENE MIST IN) 1 puff, Inhalation, Daily PRN   estradiol (ESTRACE) 2 MG tablet TAKE ONE TABLET AT BEDTIME   FLUoxetine (PROZAC) 40 mg, Oral, Daily   gabapentin (NEURONTIN) 300 mg, Oral, 3 times daily   loratadine (CLARITIN) 10 mg, Oral, Daily   metFORMIN (GLUCOPHAGE) 500 mg, Oral, 2 times daily with meals   Multiple Vitamin (MULTIVITAMIN WITH MINERALS) TABS tablet 1 tablet, Oral, Daily   omeprazole (PRILOSEC) 40 mg, Oral, Daily   oxyCODONE (OXY IR/ROXICODONE) 5-10 mg, Oral, Every  3 hours PRN   phentermine (ADIPEX-P) 37.5 mg, Oral, Daily   pravastatin (PRAVACHOL) 40 mg, Daily at bedtime    solifenacin (VESICARE) 10 mg, Oral, Daily   traZODone (DESYREL) 200 mg, Oral, Daily at bedtime   Vitamin D3 2,000 Units, Oral, Daily

## 2022-03-15 ENCOUNTER — Encounter (HOSPITAL_COMMUNITY)
Admission: RE | Disposition: A | Discharge status: Home Health | Payer: Self-pay | Source: Home / Self Care | Attending: Orthopedic Surgery

## 2022-03-15 ENCOUNTER — Other Ambulatory Visit: Payer: Self-pay

## 2022-03-15 ENCOUNTER — Ambulatory Visit (HOSPITAL_COMMUNITY): Payer: Medicare Other

## 2022-03-15 ENCOUNTER — Ambulatory Visit (HOSPITAL_BASED_OUTPATIENT_CLINIC_OR_DEPARTMENT_OTHER): Payer: Medicare Other | Admitting: Anesthesiology

## 2022-03-15 ENCOUNTER — Observation Stay (HOSPITAL_COMMUNITY)
Admission: RE | Admit: 2022-03-15 | Discharge: 2022-03-16 | Disposition: A | Discharge status: Home Health | Payer: Medicare Other | Attending: Orthopedic Surgery | Admitting: Orthopedic Surgery

## 2022-03-15 ENCOUNTER — Encounter (HOSPITAL_COMMUNITY): Payer: Self-pay | Admitting: Orthopedic Surgery

## 2022-03-15 ENCOUNTER — Ambulatory Visit (HOSPITAL_COMMUNITY): Payer: Medicare Other | Admitting: Anesthesiology

## 2022-03-15 DIAGNOSIS — Z79899 Other long term (current) drug therapy: Secondary | ICD-10-CM | POA: Diagnosis not present

## 2022-03-15 DIAGNOSIS — M1712 Unilateral primary osteoarthritis, left knee: Secondary | ICD-10-CM | POA: Diagnosis present

## 2022-03-15 DIAGNOSIS — Z7984 Long term (current) use of oral hypoglycemic drugs: Secondary | ICD-10-CM

## 2022-03-15 DIAGNOSIS — J45909 Unspecified asthma, uncomplicated: Secondary | ICD-10-CM | POA: Diagnosis not present

## 2022-03-15 DIAGNOSIS — Z8541 Personal history of malignant neoplasm of cervix uteri: Secondary | ICD-10-CM | POA: Insufficient documentation

## 2022-03-15 DIAGNOSIS — Z23 Encounter for immunization: Secondary | ICD-10-CM | POA: Insufficient documentation

## 2022-03-15 DIAGNOSIS — I10 Essential (primary) hypertension: Secondary | ICD-10-CM

## 2022-03-15 DIAGNOSIS — E119 Type 2 diabetes mellitus without complications: Secondary | ICD-10-CM | POA: Insufficient documentation

## 2022-03-15 HISTORY — PX: TOTAL KNEE ARTHROPLASTY: SHX125

## 2022-03-15 LAB — GLUCOSE, CAPILLARY
Glucose-Capillary: 162 mg/dL — ABNORMAL HIGH (ref 70–99)
Glucose-Capillary: 259 mg/dL — ABNORMAL HIGH (ref 70–99)
Glucose-Capillary: 239 mg/dL — ABNORMAL HIGH (ref 70–99)

## 2022-03-15 SURGERY — ARTHROPLASTY, KNEE, TOTAL
Anesthesia: General | Site: Knee | Laterality: Left

## 2022-03-15 MED ORDER — BUPIVACAINE-MELOXICAM ER 200-6 MG/7ML IJ SOLN
INTRAMUSCULAR | Status: AC
  Filled 2022-03-15: qty 2

## 2022-03-15 MED ORDER — GABAPENTIN 300 MG PO CAPS
300.0000 mg | ORAL_CAPSULE | Freq: Every morning | ORAL | Status: DC
  Administered 2022-03-16: 300 mg via ORAL
  Filled 2022-03-15: qty 1

## 2022-03-15 MED ORDER — FENTANYL CITRATE (PF) 100 MCG/2ML IJ SOLN
INTRAMUSCULAR | Status: AC
  Filled 2022-03-15: qty 2

## 2022-03-15 MED ORDER — HYDROCHLOROTHIAZIDE 25 MG PO TABS
25.0000 mg | ORAL_TABLET | Freq: Every morning | ORAL | Status: DC
  Administered 2022-03-16: 25 mg via ORAL
  Filled 2022-03-15: qty 1

## 2022-03-15 MED ORDER — ONDANSETRON HCL 4 MG/2ML IJ SOLN
4.0000 mg | Freq: Once | INTRAMUSCULAR | Status: DC | PRN
Start: 2022-03-15 — End: 2022-03-15

## 2022-03-15 MED ORDER — PHENYLEPHRINE HCL (PRESSORS) 10 MG/ML IV SOLN
INTRAVENOUS | Status: DC | PRN
  Administered 2022-03-15: 80 ug via INTRAVENOUS

## 2022-03-15 MED ORDER — BUPIVACAINE HCL (PF) 0.25 % IJ SOLN
INTRAMUSCULAR | Status: AC
  Filled 2022-03-15: qty 30

## 2022-03-15 MED ORDER — ROCURONIUM BROMIDE 10 MG/ML (PF) SYRINGE
PREFILLED_SYRINGE | INTRAVENOUS | Status: AC
  Filled 2022-03-15: qty 10

## 2022-03-15 MED ORDER — METOCLOPRAMIDE HCL 5 MG/ML IJ SOLN
INTRAMUSCULAR | Status: AC
  Filled 2022-03-15: qty 2

## 2022-03-15 MED ORDER — PROPOFOL 10 MG/ML IV BOLUS
INTRAVENOUS | Status: AC
  Filled 2022-03-15: qty 20

## 2022-03-15 MED ORDER — LABETALOL HCL 5 MG/ML IV SOLN
INTRAVENOUS | Status: AC
  Filled 2022-03-15: qty 4

## 2022-03-15 MED ORDER — 0.9 % SODIUM CHLORIDE (POUR BTL) OPTIME
TOPICAL | Status: DC | PRN
  Administered 2022-03-15: 1000 mL

## 2022-03-15 MED ORDER — APIXABAN 2.5 MG PO TABS
2.5000 mg | ORAL_TABLET | Freq: Two times a day (BID) | ORAL | Status: DC
  Administered 2022-03-16: 2.5 mg via ORAL
  Filled 2022-03-15: qty 1

## 2022-03-15 MED ORDER — FENTANYL CITRATE (PF) 250 MCG/5ML IJ SOLN
INTRAMUSCULAR | Status: AC
  Filled 2022-03-15: qty 5

## 2022-03-15 MED ORDER — HYDROMORPHONE HCL 1 MG/ML IJ SOLN
0.5000 mg | INTRAMUSCULAR | Status: DC | PRN
  Administered 2022-03-15: 1 mg via INTRAVENOUS
  Administered 2022-03-16: 0.5 mg via INTRAVENOUS
  Administered 2022-03-16: 1 mg via INTRAVENOUS
  Filled 2022-03-15 (×2): qty 1
  Filled 2022-03-15: qty 0.5

## 2022-03-15 MED ORDER — POVIDONE-IODINE 10 % EX SWAB: 2.0000 | Freq: Once | CUTANEOUS | Status: DC

## 2022-03-15 MED ORDER — MIDAZOLAM HCL 2 MG/2ML IJ SOLN
2.0000 mg | Freq: Once | INTRAMUSCULAR | Status: AC
  Administered 2022-03-15: 2 mg via INTRAVENOUS
  Filled 2022-03-15: qty 2

## 2022-03-15 MED ORDER — ROCURONIUM BROMIDE 100 MG/10ML IV SOLN
INTRAVENOUS | Status: DC | PRN
  Administered 2022-03-15: 20 mg via INTRAVENOUS
  Administered 2022-03-15: 70 mg via INTRAVENOUS
  Administered 2022-03-15: 30 mg via INTRAVENOUS

## 2022-03-15 MED ORDER — POTASSIUM CHLORIDE CRYS ER 20 MEQ PO TBCR
20.0000 meq | EXTENDED_RELEASE_TABLET | Freq: Two times a day (BID) | ORAL | Status: DC
  Administered 2022-03-15 – 2022-03-16 (×2): 20 meq via ORAL
  Filled 2022-03-15 (×2): qty 1

## 2022-03-15 MED ORDER — METOCLOPRAMIDE HCL 5 MG/ML IJ SOLN
INTRAMUSCULAR | Status: DC | PRN
  Administered 2022-03-15: 10 mg via INTRAVENOUS

## 2022-03-15 MED ORDER — SODIUM CHLORIDE 0.9 % IR SOLN
Status: DC | PRN
  Administered 2022-03-15: 3000 mL

## 2022-03-15 MED ORDER — SODIUM CHLORIDE (PF) 0.9 % IJ SOLN
INTRAMUSCULAR | Status: AC
  Filled 2022-03-15: qty 10

## 2022-03-15 MED ORDER — LORATADINE 10 MG PO TABS: 10.0000 mg | ORAL_TABLET | Freq: Every day | ORAL | Status: DC | PRN

## 2022-03-15 MED ORDER — ONDANSETRON HCL 4 MG/2ML IJ SOLN: 4.0000 mg | Freq: Four times a day (QID) | INTRAMUSCULAR | Status: DC | PRN

## 2022-03-15 MED ORDER — FLUOXETINE HCL 20 MG PO CAPS
40.0000 mg | ORAL_CAPSULE | Freq: Every day | ORAL | Status: DC
  Administered 2022-03-16: 40 mg via ORAL
  Filled 2022-03-15: qty 2

## 2022-03-15 MED ORDER — LIDOCAINE HCL (PF) 1 % IJ SOLN
INTRAMUSCULAR | Status: DC | PRN
  Administered 2022-03-15: 3 mL

## 2022-03-15 MED ORDER — ESMOLOL HCL 100 MG/10ML IV SOLN
INTRAVENOUS | Status: AC
  Filled 2022-03-15: qty 10

## 2022-03-15 MED ORDER — BUPIVACAINE HCL (PF) 0.25 % IJ SOLN
INTRAMUSCULAR | Status: DC | PRN
  Administered 2022-03-15: 29 mL via PERINEURAL

## 2022-03-15 MED ORDER — METOCLOPRAMIDE HCL 5 MG PO TABS: 5.0000 mg | ORAL_TABLET | Freq: Three times a day (TID) | ORAL | Status: DC | PRN

## 2022-03-15 MED ORDER — MEPERIDINE HCL 50 MG/ML IJ SOLN: 6.2500 mg | INTRAMUSCULAR | Status: DC | PRN

## 2022-03-15 MED ORDER — ONDANSETRON HCL 4 MG/2ML IJ SOLN
INTRAMUSCULAR | Status: DC | PRN
  Administered 2022-03-15: 4 mg via INTRAVENOUS

## 2022-03-15 MED ORDER — HYDROMORPHONE HCL 1 MG/ML IJ SOLN
INTRAMUSCULAR | Status: AC
  Filled 2022-03-15: qty 1

## 2022-03-15 MED ORDER — LIDOCAINE HCL (CARDIAC) PF 50 MG/5ML IV SOSY
PREFILLED_SYRINGE | INTRAVENOUS | Status: DC | PRN
  Administered 2022-03-15: 60 mg via INTRAVENOUS

## 2022-03-15 MED ORDER — LIDOCAINE HCL (PF) 2 % IJ SOLN
INTRAMUSCULAR | Status: AC
  Filled 2022-03-15: qty 5

## 2022-03-15 MED ORDER — PNEUMOCOCCAL 20-VAL CONJ VACC 0.5 ML IM SUSY
0.5000 mL | PREFILLED_SYRINGE | INTRAMUSCULAR | Status: AC
  Administered 2022-03-16: 0.5 mL via INTRAMUSCULAR
  Filled 2022-03-15: qty 0.5

## 2022-03-15 MED ORDER — ROPIVACAINE HCL 5 MG/ML IJ SOLN
INTRAMUSCULAR | Status: AC
  Filled 2022-03-15: qty 30

## 2022-03-15 MED ORDER — ALBUTEROL SULFATE HFA 108 (90 BASE) MCG/ACT IN AERS
INHALATION_SPRAY | RESPIRATORY_TRACT | Status: DC | PRN
  Administered 2022-03-15: 3 via RESPIRATORY_TRACT

## 2022-03-15 MED ORDER — SUGAMMADEX SODIUM 500 MG/5ML IV SOLN
INTRAVENOUS | Status: DC | PRN
  Administered 2022-03-15: 300 mg via INTRAVENOUS

## 2022-03-15 MED ORDER — LACTATED RINGERS IV SOLN: INTRAVENOUS | Status: DC

## 2022-03-15 MED ORDER — PHENOL 1.4 % MT LIQD: 1.0000 | OROMUCOSAL | Status: DC | PRN

## 2022-03-15 MED ORDER — METHOCARBAMOL 500 MG PO TABS
500.0000 mg | ORAL_TABLET | Freq: Four times a day (QID) | ORAL | Status: DC | PRN
  Administered 2022-03-16 (×2): 500 mg via ORAL
  Filled 2022-03-15 (×2): qty 1

## 2022-03-15 MED ORDER — BUPIVACAINE-MELOXICAM ER 200-6 MG/7ML IJ SOLN
INTRAMUSCULAR | Status: DC | PRN
  Administered 2022-03-15: 400 mg

## 2022-03-15 MED ORDER — INSULIN ASPART 100 UNIT/ML IJ SOLN
0.0000 [IU] | Freq: Three times a day (TID) | INTRAMUSCULAR | Status: DC
  Administered 2022-03-15: 5 [IU] via SUBCUTANEOUS
  Administered 2022-03-16: 2 [IU] via SUBCUTANEOUS
  Administered 2022-03-16: 1 [IU] via SUBCUTANEOUS

## 2022-03-15 MED ORDER — ALBUTEROL SULFATE (2.5 MG/3ML) 0.083% IN NEBU: 2.5000 mg | INHALATION_SOLUTION | Freq: Four times a day (QID) | RESPIRATORY_TRACT | Status: DC | PRN

## 2022-03-15 MED ORDER — DOCUSATE SODIUM 100 MG PO CAPS
100.0000 mg | ORAL_CAPSULE | Freq: Two times a day (BID) | ORAL | Status: DC
  Administered 2022-03-15 – 2022-03-16 (×2): 100 mg via ORAL
  Filled 2022-03-15 (×2): qty 1

## 2022-03-15 MED ORDER — SODIUM CHLORIDE 0.9 % IV SOLN
INTRAVENOUS | Status: DC
  Administered 2022-03-15: 750 mL via INTRAVENOUS

## 2022-03-15 MED ORDER — ALBUTEROL SULFATE HFA 108 (90 BASE) MCG/ACT IN AERS
INHALATION_SPRAY | RESPIRATORY_TRACT | Status: AC
  Filled 2022-03-15: qty 6.7

## 2022-03-15 MED ORDER — DEXAMETHASONE SODIUM PHOSPHATE 4 MG/ML IJ SOLN
INTRAMUSCULAR | Status: AC
  Filled 2022-03-15: qty 2

## 2022-03-15 MED ORDER — LIDOCAINE HCL (PF) 1 % IJ SOLN
INTRAMUSCULAR | Status: AC
  Filled 2022-03-15: qty 30

## 2022-03-15 MED ORDER — TRANEXAMIC ACID-NACL 1000-0.7 MG/100ML-% IV SOLN
1000.0000 mg | INTRAVENOUS | Status: AC
  Administered 2022-03-15: 1000 mg via INTRAVENOUS
  Filled 2022-03-15: qty 100

## 2022-03-15 MED ORDER — POLYETHYLENE GLYCOL 3350 17 G PO PACK
17.0000 g | PACK | Freq: Every day | ORAL | Status: DC
  Administered 2022-03-15 – 2022-03-16 (×2): 17 g via ORAL
  Filled 2022-03-15 (×2): qty 1

## 2022-03-15 MED ORDER — PHENYLEPHRINE 80 MCG/ML (10ML) SYRINGE FOR IV PUSH (FOR BLOOD PRESSURE SUPPORT)
PREFILLED_SYRINGE | INTRAVENOUS | Status: AC
  Filled 2022-03-15: qty 10

## 2022-03-15 MED ORDER — METFORMIN HCL 500 MG PO TABS
500.0000 mg | ORAL_TABLET | Freq: Every day | ORAL | Status: DC
  Administered 2022-03-16: 500 mg via ORAL
  Filled 2022-03-15: qty 1

## 2022-03-15 MED ORDER — OXYCODONE HCL 5 MG PO TABS
10.0000 mg | ORAL_TABLET | ORAL | Status: DC | PRN
  Administered 2022-03-15 – 2022-03-16 (×5): 10 mg via ORAL
  Filled 2022-03-15 (×5): qty 2

## 2022-03-15 MED ORDER — HYDRALAZINE HCL 20 MG/ML IJ SOLN
INTRAMUSCULAR | Status: DC | PRN
  Administered 2022-03-15: 6 mg via INTRAVENOUS
  Administered 2022-03-15: 4 mg via INTRAVENOUS

## 2022-03-15 MED ORDER — DEXAMETHASONE SODIUM PHOSPHATE 4 MG/ML IJ SOLN
INTRAMUSCULAR | Status: DC | PRN
  Administered 2022-03-15: 8 mg via PERINEURAL

## 2022-03-15 MED ORDER — INFLUENZA VAC SPLIT QUAD 0.5 ML IM SUSY
0.5000 mL | PREFILLED_SYRINGE | INTRAMUSCULAR | Status: AC
  Administered 2022-03-16: 0.5 mL via INTRAMUSCULAR
  Filled 2022-03-15: qty 0.5

## 2022-03-15 MED ORDER — ORAL CARE MOUTH RINSE: 15.0000 mL | Freq: Once | OROMUCOSAL | Status: AC

## 2022-03-15 MED ORDER — MIDAZOLAM HCL 2 MG/2ML IJ SOLN
INTRAMUSCULAR | Status: AC
  Filled 2022-03-15: qty 2

## 2022-03-15 MED ORDER — ONDANSETRON HCL 4 MG PO TABS: 4.0000 mg | ORAL_TABLET | Freq: Four times a day (QID) | ORAL | Status: DC | PRN

## 2022-03-15 MED ORDER — METHOCARBAMOL 1000 MG/10ML IJ SOLN: 500.0000 mg | Freq: Four times a day (QID) | INTRAVENOUS | Status: DC | PRN

## 2022-03-15 MED ORDER — ONDANSETRON HCL 4 MG/2ML IJ SOLN
INTRAMUSCULAR | Status: AC
  Filled 2022-03-15: qty 2

## 2022-03-15 MED ORDER — CEFAZOLIN SODIUM-DEXTROSE 2-4 GM/100ML-% IV SOLN
2.0000 g | Freq: Four times a day (QID) | INTRAVENOUS | Status: AC
  Administered 2022-03-15 – 2022-03-16 (×2): 2 g via INTRAVENOUS
  Filled 2022-03-15 (×2): qty 100

## 2022-03-15 MED ORDER — MIDAZOLAM HCL 2 MG/2ML IJ SOLN
INTRAMUSCULAR | Status: DC | PRN
  Administered 2022-03-15: 1 mg via INTRAVENOUS

## 2022-03-15 MED ORDER — PRAVASTATIN SODIUM 40 MG PO TABS: 40.0000 mg | ORAL_TABLET | Freq: Every day | ORAL | Status: DC

## 2022-03-15 MED ORDER — HYDROMORPHONE HCL 1 MG/ML IJ SOLN: 0.2500 mg | INTRAMUSCULAR | Status: DC | PRN

## 2022-03-15 MED ORDER — ARIPIPRAZOLE 2 MG PO TABS
2.0000 mg | ORAL_TABLET | Freq: Every day | ORAL | Status: DC
  Administered 2022-03-16: 2 mg via ORAL
  Filled 2022-03-15 (×4): qty 1

## 2022-03-15 MED ORDER — BISACODYL 5 MG PO TBEC: 5.0000 mg | DELAYED_RELEASE_TABLET | Freq: Every day | ORAL | Status: DC | PRN

## 2022-03-15 MED ORDER — CHLORHEXIDINE GLUCONATE 0.12 % MT SOLN
15.0000 mL | Freq: Once | OROMUCOSAL | Status: AC
  Administered 2022-03-15: 15 mL via OROMUCOSAL

## 2022-03-15 MED ORDER — FENTANYL CITRATE (PF) 100 MCG/2ML IJ SOLN
INTRAMUSCULAR | Status: DC | PRN
  Administered 2022-03-15 (×3): 50 ug via INTRAVENOUS
  Administered 2022-03-15: 100 ug via INTRAVENOUS
  Administered 2022-03-15: 50 ug via INTRAVENOUS
  Administered 2022-03-15: 100 ug via INTRAVENOUS
  Administered 2022-03-15: 50 ug via INTRAVENOUS
  Administered 2022-03-15: 100 ug via INTRAVENOUS

## 2022-03-15 MED ORDER — PANTOPRAZOLE SODIUM 40 MG PO TBEC
40.0000 mg | DELAYED_RELEASE_TABLET | Freq: Every day | ORAL | Status: DC
  Administered 2022-03-16: 40 mg via ORAL
  Filled 2022-03-15: qty 1

## 2022-03-15 MED ORDER — ESMOLOL HCL 100 MG/10ML IV SOLN
INTRAVENOUS | Status: DC | PRN
  Administered 2022-03-15: 20 ug via INTRAVENOUS
  Administered 2022-03-15: 30 ug via INTRAVENOUS
  Administered 2022-03-15: 20 ug via INTRAVENOUS

## 2022-03-15 MED ORDER — CEFAZOLIN SODIUM-DEXTROSE 2-4 GM/100ML-% IV SOLN
2.0000 g | INTRAVENOUS | Status: AC
  Administered 2022-03-15: 2 g via INTRAVENOUS
  Filled 2022-03-15: qty 100

## 2022-03-15 MED ORDER — ADULT MULTIVITAMIN W/MINERALS CH
1.0000 | ORAL_TABLET | Freq: Every day | ORAL | Status: DC
Start: 2022-03-16 — End: 2022-03-16
  Administered 2022-03-16: 1 via ORAL
  Filled 2022-03-15: qty 1

## 2022-03-15 MED ORDER — MENTHOL 3 MG MT LOZG
1.0000 | LOZENGE | OROMUCOSAL | Status: DC | PRN
Start: 2022-03-15 — End: 2022-03-16

## 2022-03-15 MED ORDER — LABETALOL HCL 5 MG/ML IV SOLN
INTRAVENOUS | Status: DC | PRN
  Administered 2022-03-15: 10 mg via INTRAVENOUS
  Administered 2022-03-15: 5 mg via INTRAVENOUS

## 2022-03-15 MED ORDER — FENTANYL CITRATE (PF) 100 MCG/2ML IJ SOLN
100.0000 ug | Freq: Once | INTRAMUSCULAR | Status: AC
  Administered 2022-03-15: 100 ug via INTRAVENOUS
  Filled 2022-03-15: qty 2

## 2022-03-15 MED ORDER — HYDROMORPHONE HCL 1 MG/ML IJ SOLN
INTRAMUSCULAR | Status: DC | PRN
  Administered 2022-03-15 (×2): .5 mg via INTRAVENOUS

## 2022-03-15 MED ORDER — TRAZODONE HCL 50 MG PO TABS
100.0000 mg | ORAL_TABLET | Freq: Every day | ORAL | Status: DC
  Administered 2022-03-15: 100 mg via ORAL
  Filled 2022-03-15: qty 2

## 2022-03-15 MED ORDER — DOCUSATE SODIUM 100 MG PO CAPS: 100.0000 mg | ORAL_CAPSULE | Freq: Every day | ORAL | Status: DC | PRN

## 2022-03-15 MED ORDER — SUGAMMADEX SODIUM 500 MG/5ML IV SOLN
INTRAVENOUS | Status: AC
  Filled 2022-03-15: qty 5

## 2022-03-15 MED ORDER — METOCLOPRAMIDE HCL 5 MG/ML IJ SOLN: 5.0000 mg | Freq: Three times a day (TID) | INTRAMUSCULAR | Status: DC | PRN

## 2022-03-15 MED ORDER — PROPOFOL 10 MG/ML IV BOLUS
INTRAVENOUS | Status: DC | PRN
  Administered 2022-03-15: 150 mg via INTRAVENOUS

## 2022-03-15 MED ORDER — TRANEXAMIC ACID-NACL 1000-0.7 MG/100ML-% IV SOLN
1000.0000 mg | Freq: Once | INTRAVENOUS | Status: AC
  Administered 2022-03-15: 1000 mg via INTRAVENOUS
  Filled 2022-03-15: qty 100

## 2022-03-15 MED ORDER — ESTRADIOL 1 MG PO TABS: 2.0000 mg | ORAL_TABLET | Freq: Every day | ORAL | Status: DC

## 2022-03-15 SURGICAL SUPPLY — 68 items
ATTUNE MED DOME PAT 38 KNEE (Knees) IMPLANT
ATTUNE PS FEM LT SZ 5 CEM KNEE (Femur) IMPLANT
BANDAGE ESMARK 6X9 LF (GAUZE/BANDAGES/DRESSINGS) ×2 IMPLANT
BASEPLATE TIB CMT FB PCKT SZ5 (Knees) IMPLANT
BLADE HEX COATED 2.75 (ELECTRODE) IMPLANT
BLADE SAGITTAL 25.0X1.27X90 (BLADE) ×2 IMPLANT
BLADE SAW SGTL 11.0X1.19X90.0M (BLADE) ×2 IMPLANT
BNDG CMPR 9X6 STRL LF SNTH (GAUZE/BANDAGES/DRESSINGS) ×1
BNDG ESMARK 6X9 LF (GAUZE/BANDAGES/DRESSINGS) ×1
BSPLAT TIB 5 CMNT FXBRNG STRL (Knees) ×1 IMPLANT
CEMENT HV SMART SET (Cement) ×4 IMPLANT
CLOTH BEACON ORANGE TIMEOUT ST (SAFETY) ×2 IMPLANT
COOLER ICEMAN CLASSIC (MISCELLANEOUS) ×2 IMPLANT
COVER LIGHT HANDLE STERIS (MISCELLANEOUS) ×4 IMPLANT
CUFF TOURN SGL QUICK 34 (TOURNIQUET CUFF) ×1
CUFF TRNQT CYL 34X4.125X (TOURNIQUET CUFF) ×2 IMPLANT
DRAPE BACK TABLE (DRAPES) ×2 IMPLANT
DRAPE EXTREMITY T 121X128X90 (DISPOSABLE) ×2 IMPLANT
DRESSING AQUACEL AG ADV 3.5X12 (MISCELLANEOUS) ×2 IMPLANT
DRSG AQUACEL AG ADV 3.5X12 (MISCELLANEOUS) ×1
DURAPREP 26ML APPLICATOR (WOUND CARE) ×4 IMPLANT
ELECT REM PT RETURN 9FT ADLT (ELECTROSURGICAL) ×1
ELECTRODE REM PT RTRN 9FT ADLT (ELECTROSURGICAL) ×2 IMPLANT
EVACUATOR 3/16  PVC DRAIN (DRAIN) ×1
EVACUATOR 3/16 PVC DRAIN (DRAIN) IMPLANT
GLOVE BIO SURGEON STRL SZ7 (GLOVE) IMPLANT
GLOVE BIO SURGEON STRL SZ7.5 (GLOVE) IMPLANT
GLOVE BIOGEL PI IND STRL 7.0 (GLOVE) ×6 IMPLANT
GLOVE BIOGEL PI IND STRL 7.5 (GLOVE) IMPLANT
GLOVE BIOGEL PI IND STRL 8.5 (GLOVE) ×2 IMPLANT
GLOVE SKINSENSE STRL SZ8.0 LF (GLOVE) ×2 IMPLANT
GOWN STRL REUS W/TWL LRG LVL3 (GOWN DISPOSABLE) ×6 IMPLANT
GOWN STRL REUS W/TWL XL LVL3 (GOWN DISPOSABLE) ×2 IMPLANT
HANDPIECE INTERPULSE COAX TIP (DISPOSABLE) ×1
HOOD W/PEELAWAY (MISCELLANEOUS) ×8 IMPLANT
INSERT TIB ATTUNE FB SZ5X8 (Insert) IMPLANT
INST SET MAJOR BONE (KITS) ×2 IMPLANT
IV NS IRRIG 3000ML ARTHROMATIC (IV SOLUTION) ×2 IMPLANT
KIT BLADEGUARD II DBL (SET/KITS/TRAYS/PACK) ×2 IMPLANT
KIT TURNOVER KIT A (KITS) ×2 IMPLANT
MANIFOLD NEPTUNE II (INSTRUMENTS) ×2 IMPLANT
MARKER SKIN DUAL TIP RULER LAB (MISCELLANEOUS) ×2 IMPLANT
NS IRRIG 1000ML POUR BTL (IV SOLUTION) ×2 IMPLANT
PACK TOTAL JOINT (CUSTOM PROCEDURE TRAY) ×2 IMPLANT
PAD ARMBOARD 7.5X6 YLW CONV (MISCELLANEOUS) ×2 IMPLANT
PAD COLD SHLDR SM WRAP-ON (PAD) ×2 IMPLANT
PILLOW KNEE EXTENSION 0 DEG (MISCELLANEOUS) ×2 IMPLANT
PIN/DRILL PACK ORTHO 1/8X3.0 (PIN) ×2 IMPLANT
SAW OSC TIP CART 19.5X105X1.3 (SAW) ×2 IMPLANT
SET BASIN LINEN APH (SET/KITS/TRAYS/PACK) ×2 IMPLANT
SET HNDPC FAN SPRY TIP SCT (DISPOSABLE) ×2 IMPLANT
SOLUTION PRONTOSAN WOUND 350ML (IRRIGATION / IRRIGATOR) ×2 IMPLANT
SPONGE DRAIN TRACH 4X4 STRL 2S (GAUZE/BANDAGES/DRESSINGS) IMPLANT
SPONGE T-LAP 18X18 ~~LOC~~+RFID (SPONGE) IMPLANT
STAPLER VISISTAT 35W (STAPLE) ×2 IMPLANT
SUT BONE WAX W31G (SUTURE) IMPLANT
SUT BRALON NAB BRD #1 30IN (SUTURE) ×2 IMPLANT
SUT MNCRL 0 VIOLET CTX 36 (SUTURE) ×2 IMPLANT
SUT MON AB 0 CT1 (SUTURE) ×2 IMPLANT
SUT MONOCRYL 0 CTX 36 (SUTURE) ×1
SUT VIC AB 1 CT1 27 (SUTURE) ×1
SUT VIC AB 1 CT1 27XBRD ANTBC (SUTURE) IMPLANT
SYR BULB IRRIG 60ML STRL (SYRINGE) ×2 IMPLANT
TOWEL OR 17X26 4PK STRL BLUE (TOWEL DISPOSABLE) ×2 IMPLANT
TOWER CARTRIDGE SMART MIX (DISPOSABLE) ×2 IMPLANT
TRAY FOLEY MTR SLVR 16FR STAT (SET/KITS/TRAYS/PACK) ×2 IMPLANT
WATER STERILE IRR 1000ML POUR (IV SOLUTION) ×4 IMPLANT
YANKAUER SUCT 12FT TUBE ARGYLE (SUCTIONS) ×2 IMPLANT

## 2022-03-15 NOTE — Anesthesia Procedure Notes (Signed)
Anesthesia Regional Block: Adductor canal block   Pre-Anesthetic Checklist: , timeout performed,  Correct Patient, Correct Site, Correct Laterality,  Correct Procedure, Correct Position, site marked,  Risks and benefits discussed,  At surgeon's request and post-op pain management  Laterality: Left  Prep: chloraprep       Needles:  Injection technique: Single-shot  Needle Type: Echogenic Stimulator Needle     Needle Length: 10cm  Needle Gauge: 20   Needle insertion depth: 8 cm   Additional Needles:   Procedures:, nerve stimulator,,, ultrasound used (permanent image in chart),,     Nerve Stimulator or Paresthesia:  Response: No Twitch elicited, 0.5 mA, 8 cm  Additional Responses:   Narrative:  Start time: 03/15/2022 10:15 AM End time: 03/15/2022 10:25 AM Injection made incrementally with aspirations every 5 mL.  Performed by: Personally  Anesthesiologist: Denese Killings, MD  Additional Notes: BP cuff, EKG monitors applied. Sedation begun. After nerve location anesthetic injected incrementally, slowly , and after neg aspirations. Tolerated well.

## 2022-03-15 NOTE — Progress Notes (Signed)
Pt assisted back to bed from chair. Pt used FWW, able to stand without assist and took small but steady steps back to bed. Pt states knee is "sore" but pain controlled. Pt with good movement and sensation in left foot/toes, skin warm, dry and pink. Cool pack remains intact to left knee. Hemovac has drained scant amount of sanguinous drainage. Pt's foot back in bone foam with leg straight. Reminded pt of importance of keeping left foot in foam so that leg remains straight. Pt states understanding. Call bell within reach, bed alarm on for safety.

## 2022-03-15 NOTE — Plan of Care (Signed)
  Problem: Acute Rehab PT Goals(only PT should resolve) Goal: Pt Will Go Supine/Side To Sit Outcome: Progressing Flowsheets (Taken 03/15/2022 1637) Pt will go Supine/Side to Sit: with modified independence Goal: Patient Will Transfer Sit To/From Stand Outcome: Progressing Flowsheets (Taken 03/15/2022 1637) Patient will transfer sit to/from stand: with modified independence Goal: Pt Will Transfer Bed To Chair/Chair To Bed Outcome: Progressing Flowsheets (Taken 03/15/2022 1637) Pt will Transfer Bed to Chair/Chair to Bed: with modified independence Goal: Pt Will Ambulate Outcome: Progressing Flowsheets (Taken 03/15/2022 1637) Pt will Ambulate:  > 125 feet  with modified independence  with rolling walker   4:37 PM, 03/15/22 Lonell Grandchild, MPT Physical Therapist with [facility] [phone] office [phone] mobile phone

## 2022-03-15 NOTE — Anesthesia Postprocedure Evaluation (Signed)
Anesthesia Post Note  Patient: [name]  Procedure(s) Performed: TOTAL KNEE ARTHROPLASTY (Left: Knee)  Patient location during evaluation: PACU Anesthesia Type: General Level of consciousness: awake and alert and oriented Pain management: pain level controlled Vital Signs Assessment: post-procedure vital signs reviewed and stable Respiratory status: respiratory function stable, spontaneous breathing and patient connected to nasal cannula oxygen Cardiovascular status: blood pressure returned to baseline and stable Postop Assessment: no apparent nausea or vomiting Anesthetic complications: no   No notable events documented.   Last Vitals:  Vitals:   03/15/22 1515 03/15/22 1530  BP: 113/74 129/69  Pulse: 93 90  Resp: 13 13  Temp:    SpO2: 98% 97%    Last Pain:  Vitals:   03/15/22 1500  TempSrc:   PainSc: 0-No pain                 Samier Jaco C Jayd Forrey

## 2022-03-15 NOTE — Progress Notes (Signed)
1550: Pt to room via bed from PACU. AA&O, VSS. IVF infusing without difficulty. Foley cath intact draining clear yellow urine. Left foot/leg warm to touch, pink in color with good cap refill, movement and sensation. Ted hose on. Hemovac intact to left knee wound, charged, small amount serosanguinous drainage noted. Explained purpose and importance of bone foam use while in bed. Cooling pack intact to leg. Both pt and husband state understanding. Call bell in place, bed in lowest position and bed alarm on for safety. Pt and husband state understanding of safety procedures.  Currently, PT in to work with pt. Pt is up OOB and ambulating with FWW. Tolerating well.

## 2022-03-15 NOTE — Progress Notes (Signed)
Left Adductor canal block  Dr Burtis Junes  Time out 10:00 Start 10:15 Stop  10:25  [place]

## 2022-03-15 NOTE — Anesthesia Preprocedure Evaluation (Signed)
Anesthesia Evaluation  Patient identified by MRN, date of birth, ID band Patient awake    Reviewed: Allergy & Precautions, NPO status , Patient's Chart, lab work & pertinent test results  Airway Mallampati: II  TM Distance: >3 FB Neck ROM: Full    Dental  (+) Edentulous Upper, Edentulous Lower   Pulmonary asthma , former smoker,    Pulmonary exam normal breath sounds clear to auscultation       Cardiovascular Exercise Tolerance: Poor hypertension, Pt. on medications Normal cardiovascular exam Rhythm:Regular Rate:Normal     Neuro/Psych PSYCHIATRIC DISORDERS Anxiety Depression  Neuromuscular disease (cervical radiculopathy)    GI/Hepatic Neg liver ROS, GERD  Medicated and Controlled,  Endo/Other  diabetes, Well Controlled, Type 2, Oral Hypoglycemic AgentsMorbid obesity  Renal/GU negative Renal ROS  Female GU complaint (cervical cancer)     Musculoskeletal  (+) Arthritis , Osteoarthritis,    Abdominal   Peds negative pediatric ROS (+)  Hematology negative hematology ROS (+)   Anesthesia Other Findings   Reproductive/Obstetrics negative OB ROS                            Anesthesia Physical Anesthesia Plan  ASA: 3  Anesthesia Plan: General   Post-op Pain Management: Regional block* and Dilaudid IV   Induction: Intravenous  PONV Risk Score and Plan: 4 or greater and Ondansetron, Dexamethasone and Midazolam  Airway Management Planned: Oral ETT  Additional Equipment:   Intra-op Plan:   Post-operative Plan: Extubation in OR  Informed Consent: I have reviewed the patients History and Physical, chart, labs and discussed the procedure including the risks, benefits and alternatives for the proposed anesthesia with the patient or authorized representative who has indicated his/her understanding and acceptance.     Dental advisory given  Plan Discussed with: CRNA and  Surgeon  Anesthesia Plan Comments:        Anesthesia Quick Evaluation

## 2022-03-15 NOTE — Op Note (Signed)
03/15/2022  2:27 PM  PATIENT:  [name]  61 y.o. female  PRE-OPERATIVE DIAGNOSIS:  Osteoarthritis left knee  POST-OPERATIVE DIAGNOSIS:  Osteoarthritis left knee  PROCEDURE:  Procedure(s): TOTAL KNEE ARTHROPLASTY (Left)  Implants  [employer] attune fixed-bearing posterior stabilized total knee size 5 femur size 5 tibia size 8 polyethylene PS and size 38 patella  Bone cuts distal femur 11  Proximal tibia 10  Patella size 22 cut down to size 12    SURGEON:  Surgeon(s) and Role:    Carole Civil, MD - Primary  PHYSICIAN ASSISTANT:   ASSISTANTS: Vevelyn Royals  ANESTHESIA:   general  EBL:  600 mL   BLOOD ADMINISTERED:none  DRAINS: Hemovac drain x1  LOCAL MEDICATIONS USED:  ZINRELEF  SPECIMEN:  No Specimen  DISPOSITION OF SPECIMEN:  N/A  COUNTS:  YES  TOURNIQUET:   Total Tourniquet Time Documented: Thigh (Left) - 14 minutes Thigh (Left) - 45 minutes Total: Thigh (Left) - 59 minutes Note the tourniquet time #1 at 14 minutes and #2 at 45 minutes functioned as venous tourniquets.  DICTATION: IT trainer Dictation  PLAN OF CARE: Admit to inpatient   PATIENT DISPOSITION:  PACU - hemodynamically stable.   Delay start of Pharmacological VTE agent (>24hrs) due to surgical blood loss or risk of bleeding: not applicable  The procedure was done as follows  I first saw [name] in the preop area cleared her for surgery after evaluation and chart review.  Images were reviewed as well as implants checked for availability x-rays reviewed as well  Patient surgical site marked left knee confirmed  Patient taken the operating room for general anesthesia  After insertion of Foley catheter and the leg was prepped and draped sterilely  Timeout was taken  We exsanguinated the limb with a 6 inch Esmarch the tourniquet was elevated to 280 mmHg  A midline incision was made with the knee flexed the medial parapatellar arthrotomy was performed  at this point we noticed excessive bleeding  The patient's blood pressure was approximately 683 systolic and after about 15 minutes of trying to deal with the bleeding we let the tourniquet down  Anesthesia worked on the blood pressure we continued by previous excising the patella fat pad performing the medial elevation of the soft tissue sleeve removal of the medial lateral meniscus and ACL and PCL resection  The bleeding stopped and we tried to elevate the tourniquet again after exsanguination of the limb and we still had excessive bleeding.  This delayed the case quite a bit.  We will basically functioning with a venous tourniquet again.  We continued with bone cuts starting with the proximal tibia.  We measured from the lateral side 7 mm resection.  We measured the bone it was a 10 mm resection.  We continued with intramedullary drilling of the femoral canal set for 5 degree left with 11 mm distal resection.  We measured the resection and it was 11 mm  We continued the procedure let the tourniquet down controlled bleeding with cautery and bone wax the bony surfaces that we did not need for implantation  Extension gap was checked up to an 8 block.  We measured the size of the femur to a size 5.  We dropped the block 1.5 mm to coincide with an 8 spacer block.  We then used an angel wing to assure that we would not notch.  After we made the cuts there was slight notching this was handled  by rondure and rasp to smooth the surface that was just beneath the cortex.  We tried to match the patient's external rotation we used Whitesides line  We made our 4 cuts and then will reperformed the gap check 8 spacer block gave the best stability with full extension.  We made the notch cut with a size 5 femur we did a trial reduction with an 8 poly we did an anterior drawer test at 90 degrees flexion we did a extension test.  We had good stability good range of motion no lift off of the tray.  We drilled  the lug holes  We continued by preparing the proximal tibia  We measured the patella to a size 22 cut it down to a size 12 and sized it to a 38 and placed the 3 peg holes  We then irrigated the knee controlled any bleeding with cautery  We then dried the bone put the tourniquet back up with exsanguination of the limb prior to tourniquet elevation.  Tourniquet elevated to 300 mmHg.  We repaired the cement on the back table we cemented the implants in place we removed excess cement.  After all cement was cured we checked for any excess cement and removed it we irrigated the joint.  We placed Prontosan  We remove the Prontosan after several minutes.  We closed the capsule with #1 Braylon in interrupted fashion.  We left 1 hole open to put the Zen relief and and then closed with a #1 Braylon suture  We closed the subcu with 2 layers of 0 Monocryl and staples followed by dressing.  We did place a Hemovac drain in the joint.  We activated after the tourniquet was released.  The patient was extubated and taken to recovery in stable condition  No change in the postop protocol

## 2022-03-15 NOTE — Brief Op Note (Signed)
03/15/2022  2:27 PM  PATIENT:  [name]  61 y.o. female  PRE-OPERATIVE DIAGNOSIS:  Osteoarthritis left knee  POST-OPERATIVE DIAGNOSIS:  Osteoarthritis left knee  PROCEDURE:  Procedure(s): TOTAL KNEE ARTHROPLASTY (Left)  Implants  [facility] attune fixed-bearing posterior stabilized total knee size 5 femur size 5 tibia size 8 polyethylene PS and size 38 patella  Bone cuts distal femur 11  Proximal tibia 10  Patella size 22 cut down to size 12    SURGEON:  Surgeon(s) and Role:    Carole Civil, MD - Primary  PHYSICIAN ASSISTANT:   ASSISTANTS: Vevelyn Royals  ANESTHESIA:   general  EBL:  600 mL   BLOOD ADMINISTERED:none  DRAINS: Hemovac drain x1  LOCAL MEDICATIONS USED:  ZINRELEF  SPECIMEN:  No Specimen  DISPOSITION OF SPECIMEN:  N/A  COUNTS:  YES  TOURNIQUET:   Total Tourniquet Time Documented: Thigh (Left) - 14 minutes Thigh (Left) - 45 minutes Total: Thigh (Left) - 59 minutes Note the tourniquet time #1 at 14 minutes and #2 at 45 minutes functioned as venous tourniquets.  DICTATION: IT trainer Dictation  PLAN OF CARE: Admit to inpatient   PATIENT DISPOSITION:  PACU - hemodynamically stable.   Delay start of Pharmacological VTE agent (>24hrs) due to surgical blood loss or risk of bleeding: not applicable

## 2022-03-15 NOTE — Interval H&P Note (Signed)
History and Physical Interval Note:  03/15/2022 10:40 AM  [name]  has presented today for surgery, with the diagnosis of Osteoarthritis left knee.  The various methods of treatment have been discussed with the patient and family. After consideration of risks, benefits and other options for treatment, the patient has consented to  Procedure(s): TOTAL KNEE ARTHROPLASTY (Left) as a surgical intervention.  The patient's history has been reviewed, patient examined, no change in status, stable for surgery.  I have reviewed the patient's chart and labs.  Questions were answered to the patient's satisfaction.     Arther Abbott

## 2022-03-15 NOTE — Anesthesia Procedure Notes (Signed)
Procedure Name: Intubation Date/Time: 03/15/2022 11:32 AM  Performed by: Vista Deck, CRNAPre-anesthesia Checklist: Patient identified, Patient being monitored, Timeout performed, Emergency Drugs available and Suction available Patient Re-evaluated:Patient Re-evaluated prior to induction Oxygen Delivery Method: Circle system utilized Preoxygenation: Pre-oxygenation with 100% oxygen Induction Type: IV induction Ventilation: Mask ventilation without difficulty Laryngoscope Size: Mac and 3 Grade View: Grade I Tube type: Oral Tube size: 7.0 mm Number of attempts: 1 Airway Equipment and Method: Stylet Placement Confirmation: ETT inserted through vocal cords under direct vision, positive ETCO2 and breath sounds checked- equal and bilateral Secured at: 21 cm Tube secured with: Tape Dental Injury: Teeth and Oropharynx as per pre-operative assessment

## 2022-03-15 NOTE — Transfer of Care (Signed)
Immediate Anesthesia Transfer of Care Note  Patient: [name]  Procedure(s) Performed: TOTAL KNEE ARTHROPLASTY (Left: Knee)  Patient Location: PACU  Anesthesia Type:General  Level of Consciousness: awake  Airway & Oxygen Therapy: Patient Spontanous Breathing and Patient connected to nasal cannula oxygen  Post-op Assessment: Report given to RN and Post -op Vital signs reviewed and stable  Post vital signs: Reviewed and stable  Last Vitals:  Vitals Value Taken Time  BP 130/53 03/15/22 1430  Temp 98.8   Pulse 88 03/15/22 1433  Resp 14 03/15/22 1433  SpO2 96 % 03/15/22 1433  Vitals shown include unvalidated device data.  Last Pain:  Vitals:   03/15/22 0904  TempSrc: Oral  PainSc: 8          Complications: No notable events documented.

## 2022-03-15 NOTE — Evaluation (Signed)
Physical Therapy Evaluation Patient Details Name: [name] MRN: [id number] DOB: 05/22/1961 Today's Date: 03/15/2022   LEFT KNEE ROM: 0 - 100 degrees AMBULATION DISTANCE: 60 feet using RW with Supervision   History of Present Illness  [name]  is a 61 y/o female, s/p Left TKA on 03/15/22, with the diagnosis of Osteoarthritis left knee.  Clinical Impression  Patient instructed in and give written instructions for HEP, able to achieve up to 100 degrees left flexion self stretching while seated at bedside, and able to ambulate in hallway with fair/good return for left heel to toe stepping without loss of balance, but limited mostly due to fatigue and increasing left knee pain.  Patient tolerated sitting up in chair with LLE dangling after therapy with her spouse in room - RN aware.  Patient will benefit from continued skilled physical therapy in hospital and recommended venue below to increase strength, balance, endurance for safe ADLs and gait.         Recommendations for follow up therapy are one component of a multi-disciplinary discharge planning process, led by the attending physician.  Recommendations may be updated based on patient status, additional functional criteria and insurance authorization.  Follow Up Recommendations Home health PT      Assistance Recommended at Discharge Set up Supervision/Assistance  Patient can return home with the following  A little help with walking and/or transfers;A little help with bathing/dressing/bathroom;Help with stairs or ramp for entrance;Assistance with cooking/housework    Equipment Recommendations None recommended by PT  Recommendations for Other Services       Functional Status Assessment Patient has had a recent decline in their functional status and demonstrates the ability to make significant improvements in function in a reasonable and predictable amount of time.     Precautions / Restrictions  Precautions Precautions: Fall Restrictions Weight Bearing Restrictions: Yes LLE Weight Bearing: Weight bearing as tolerated      Mobility  Bed Mobility Overal bed mobility: Needs Assistance Bed Mobility: Supine to Sit     Supine to sit: Modified independent (Device/Increase time), Supervision     General bed mobility comments: good return for moving LLE    Transfers Overall transfer level: Needs assistance Equipment used: Rolling walker (2 wheels) Transfers: Sit to/from Stand, Bed to chair/wheelchair/BSC Sit to Stand: Min guard   Step pivot transfers: Min guard       General transfer comment: increased time, labored movement    Ambulation/Gait Ambulation/Gait assistance: Supervision Gait Distance (Feet): 60 Feet Assistive device: Rolling walker (2 wheels) Gait Pattern/deviations: Decreased step length - right, Decreased step length - left, Decreased stance time - left, Antalgic Gait velocity: decreased     General Gait Details: slow labored cadence with fair/good return for left heel to toe stepping without loss of balance, limited mostly due to fatigue and increasing left knee pain  Stairs            Wheelchair Mobility    Modified Rankin (Stroke Patients Only)       Balance Overall balance assessment: Needs assistance Sitting-balance support: Feet supported, No upper extremity supported Sitting balance-Leahy Scale: Fair Sitting balance - Comments: fair/good seated at EOB   Standing balance support: During functional activity, Bilateral upper extremity supported Standing balance-Leahy Scale: Fair Standing balance comment: fair/good using RW                             Pertinent Vitals/Pain Pain Assessment Pain  Assessment: Faces Faces Pain Scale: Hurts even more Pain Location: left knee Pain Descriptors / Indicators: Guarding, Grimacing, Sore Pain Intervention(s): Limited activity within patient's tolerance, Monitored during  session, Premedicated before session, Repositioned, Ice applied    Home Living Family/patient expects to be discharged to:: Private residence Living Arrangements: Spouse/significant other Available Help at Discharge: Family;Available 24 hours/day Type of Home: House Home Access: Stairs to enter Entrance Stairs-Rails: Left Entrance Stairs-Number of Steps: 3   Home Layout: One level Home Equipment: Rolling Walker (2 wheels);BSC/3in1;Shower seat;Grab bars - tub/shower;Cane - single point      Prior Function Prior Level of Function : Independent/Modified Independent             Mobility Comments: Community ambulation with SPC, drives ADLs Comments: Independent     Hand Dominance   Dominant Hand: Right    Extremity/Trunk Assessment   Upper Extremity Assessment Upper Extremity Assessment: Overall WFL for tasks assessed    Lower Extremity Assessment Lower Extremity Assessment: Overall WFL for tasks assessed;LLE deficits/detail LLE Deficits / Details: grossly 4-/5 LLE: Unable to fully assess due to pain LLE Sensation: WNL LLE Coordination: WNL    Cervical / Trunk Assessment Cervical / Trunk Assessment: Normal  Communication   Communication: No difficulties  Cognition Arousal/Alertness: Awake/alert Behavior During Therapy: WFL for tasks assessed/performed Overall Cognitive Status: Within Functional Limits for tasks assessed                                          General Comments      Exercises Total Joint Exercises Ankle Circles/Pumps: Supine, 10 reps, Right, Strengthening, AROM Quad Sets: AROM, Strengthening, Right, 10 reps, Supine Short Arc Quad: AROM, Strengthening, Right, 10 reps, Supine Heel Slides: AROM, Strengthening, Right, 10 reps, Supine Goniometric ROM: left knee ROM: 0 - 100 degrees   Assessment/Plan    PT Assessment Patient needs continued PT services  PT Problem List Decreased strength;Decreased range of motion;Decreased  activity tolerance;Decreased balance;Decreased mobility;Pain       PT Treatment Interventions DME instruction;Gait training;Stair training;Functional mobility training;Therapeutic activities;Therapeutic exercise;Patient/family education;Balance training    PT Goals (Current goals can be found in the Care Plan section)  Acute Rehab PT Goals Patient Stated Goal: return home with family to assist PT Goal Formulation: With patient/family Time For Goal Achievement: 03/17/22 Potential to Achieve Goals: Good    Frequency BID     Co-evaluation               AM-PAC PT "6 Clicks" Mobility  Outcome Measure Help needed turning from your back to your side while in a flat bed without using bedrails?: None Help needed moving from lying on your back to sitting on the side of a flat bed without using bedrails?: A Little Help needed moving to and from a bed to a chair (including a wheelchair)?: A Little Help needed standing up from a chair using your arms (e.g., wheelchair or bedside chair)?: A Little Help needed to walk in hospital room?: A Little Help needed climbing 3-5 steps with a railing? : A Lot 6 Click Score: 18    End of Session   Activity Tolerance: Patient tolerated treatment well;Patient limited by fatigue;Patient limited by pain Patient left: in chair;with call bell/phone within reach Nurse Communication: Mobility status PT Visit Diagnosis: Unsteadiness on feet (R26.81);Other abnormalities of gait and mobility (R26.89);Muscle weakness (generalized) (M62.81)    Time:  5625-6389 PT Time Calculation (min) (ACUTE ONLY): 30 min   Charges:   PT Evaluation $PT Eval Moderate Complexity: 1 Mod PT Treatments $Therapeutic Activity: 23-37 mins        4:36 PM, 03/15/22 Lonell Grandchild, MPT Physical Therapist with [facility] [phone] office [phone] mobile phone

## 2022-03-16 DIAGNOSIS — M1712 Unilateral primary osteoarthritis, left knee: Secondary | ICD-10-CM | POA: Diagnosis not present

## 2022-03-16 LAB — CBC
HCT: 29.4 % — ABNORMAL LOW (ref 36.0–46.0)
Hemoglobin: 9.7 g/dL — ABNORMAL LOW (ref 12.0–15.0)
MCH: 30.7 pg (ref 26.0–34.0)
MCHC: 33 g/dL (ref 30.0–36.0)
MCV: 93 fL (ref 80.0–100.0)
Platelets: 240 10*3/uL (ref 150–400)
RBC: 3.16 MIL/uL — ABNORMAL LOW (ref 3.87–5.11)
RDW: 14.3 % (ref 11.5–15.5)
WBC: 19.2 10*3/uL — ABNORMAL HIGH (ref 4.0–10.5)
nRBC: 0 % (ref 0.0–0.2)

## 2022-03-16 LAB — BASIC METABOLIC PANEL
Anion gap: 6 (ref 5–15)
BUN: 10 mg/dL (ref 6–20)
CO2: 24 mmol/L (ref 22–32)
Calcium: 8.2 mg/dL — ABNORMAL LOW (ref 8.9–10.3)
Chloride: 105 mmol/L (ref 98–111)
Creatinine, Ser: 0.69 mg/dL (ref 0.44–1.00)
GFR, Estimated: >60 mL/min (ref >60)
Glucose, Bld: 180 mg/dL — ABNORMAL HIGH (ref 70–99)
Potassium: 4.1 mmol/L (ref 3.5–5.1)
Sodium: 135 mmol/L (ref 135–145)

## 2022-03-16 LAB — GLUCOSE, CAPILLARY
Glucose-Capillary: 139 mg/dL — ABNORMAL HIGH (ref 70–99)
Glucose-Capillary: 160 mg/dL — ABNORMAL HIGH (ref 70–99)
Glucose-Capillary: 164 mg/dL — ABNORMAL HIGH (ref 70–99)

## 2022-03-16 MED ORDER — POLYETHYLENE GLYCOL 3350 17 G PO PACK: 17.0000 g | PACK | Freq: Every day | ORAL | 0 refills | Status: DC

## 2022-03-16 MED ORDER — METHOCARBAMOL 500 MG PO TABS: 500.0000 mg | ORAL_TABLET | Freq: Four times a day (QID) | ORAL | 2 refills | Status: DC | PRN

## 2022-03-16 MED ORDER — OXYCODONE-ACETAMINOPHEN 7.5-325 MG PO TABS: ORAL_TABLET | ORAL | 0 refills | Status: DC

## 2022-03-16 NOTE — TOC Transition Note (Signed)
Transition of Care [facility]) - CM/SW Discharge Note   Patient Details  Name: AZAYLEA MAVES MRN: [id number] Date of Birth: Oct 27, 1960  [facility] CM/SW Contact:  Iona Beard, [place] Phone Number: 03/16/2022, 9:13 AM   Clinical Narrative:    [facility] consulted for [facility] needs. CSW spoke to [place] with [employer] who states pt has already been set up for [facility] services with their company through Dr. Ruthe Mannan office. Orders have already been given. TOC signing off.   Final next level of care: [place] Barriers to Discharge: No Barriers Identified   Patient Goals and CMS Choice Patient states their goals for this hospitalization and ongoing recovery are:: return home with [facility] CMS Medicare.gov Compare Post Acute Care list provided to:: Patient Choice offered to / list presented to : Patient  Discharge Placement                       Discharge Plan and Services                          HH Arranged: PT [facility] Agency: [place] Date [place]: 03/16/22   Representative spoke with at [place]: [place] ([place]) Interventions     Readmission Risk Interventions     No data to display

## 2022-03-16 NOTE — Progress Notes (Signed)
Pt discharged via WC to POV with personal belongings in her granddaughter's possession. Family instructed on setup and use of ice-man cooling pad, state understanding. Bone foam sent home with pt.

## 2022-03-16 NOTE — Discharge Summary (Signed)
Physician Discharge Summary  Patient ID: [name] MRN: [id number] DOB/AGE: [birth year] 61 y.o.  Admit date: 03/15/2022 Discharge date: 03/16/2022  Admission Diagnoses: Osteoarthritis left knee  Discharge Diagnoses:  Principal Problem:   Osteoarthritis of left knee   Discharged Condition: good  Hospital Course: Hospital course was unremarkable.  The patient underwent uncomplicated left total knee.  Surgery date was March 15, 2022-day of admission.  The patient was able to walk with physical therapy  Postop day 1 which was March 16, 2022 tolerated physical therapy well  Consults: None  Significant Diagnostic Studies: labs:     Latest Ref Rng & Units 03/16/2022    2:47 AM 03/10/2022   12:27 PM 08/21/2020    9:25 AM  CBC  WBC 4.0 - 10.5 K/uL 19.2  10.3  10.1   Hemoglobin 12.0 - 15.0 g/dL 9.7  13.7  13.0   Hematocrit 36.0 - 46.0 % 29.4  40.9  40.5   Platelets 150 - 400 K/uL 240  246  288        Latest Ref Rng & Units 03/16/2022    2:47 AM 03/10/2022   12:27 PM 06/28/2021    4:10 PM  BMP  Glucose 70 - 99 mg/dL 180  133  128   BUN 6 - 20 mg/dL '10  7  11   '$ Creatinine 0.44 - 1.00 mg/dL 0.69  0.66  0.69   Sodium 135 - 145 mmol/L 135  135  138   Potassium 3.5 - 5.1 mmol/L 4.1  3.1  3.8   Chloride 98 - 111 mmol/L 105  99  105   CO2 22 - 32 mmol/L '24  26  25   '$ Calcium 8.9 - 10.3 mg/dL 8.2  9.1  9.1     Surgical implants [employer] attune fixed-bearing total knee size 5 femur size 5 tibia size 8 PS polyethylene size 38 patella  Discharge Exam: Blood pressure (!) 124/47, pulse 97, temperature 97.8 F (36.6 C), resp. rate 20, height '5\' 5"'$  (1.651 m), SpO2 97 %. Calf soft supple neurovascular exam intact minimal drainage left knee  Patient somewhat sleepy  Otherwise awake conversant appropriate  Disposition: Discharge disposition: 01-Home or Self Care       Discharge Instructions     Call MD / Call 911   Complete by: As directed    If you  experience chest pain or shortness of breath, CALL 911 and be transported to the hospital emergency room.  If you develope a fever above 101 F, pus (white drainage) or increased drainage or redness at the wound, or calf pain, call your surgeon's office.   Constipation Prevention   Complete by: As directed    Drink plenty of fluids.  Prune juice may be helpful.  You may use a stool softener, such as Colace (over the counter) 100 mg twice a day.  Use MiraLax (over the counter) for constipation as needed.   Diet - low sodium heart healthy   Complete by: As directed    Discharge instructions   Complete by: As directed    CPM machine 0-80 increase 10 degrees/day if tolerated up to 120 degrees.  Use CPM for 2 weeks and call company to pick up  Weightbearing status as tolerated with crutches  Bone foam blue pillow under the heel 3 times a day for 30 minutes  Exercises at home as taught by physical therapy  Stockings for 2 weeks if continue on Eliquis which is a blood  thinner.  If you cannot afford the Eliquis we will switch to aspirin and then the stockings will be worn for 4 weeks   Increase activity slowly as tolerated   Complete by: As directed    Post-operative opioid taper instructions:   Complete by: As directed    POST-OPERATIVE OPIOID TAPER INSTRUCTIONS: It is important to wean off of your opioid medication as soon as possible. If you do not need pain medication after your surgery it is ok to stop day one. Opioids include: Codeine, Hydrocodone(Norco, Vicodin), Oxycodone(Percocet, oxycontin) and hydromorphone amongst others.  Long term and even short term use of opiods can cause: Increased pain response Dependence Constipation Depression Respiratory depression And more.  Withdrawal symptoms can include Flu like symptoms Nausea, vomiting And more Techniques to manage these symptoms Hydrate well Eat regular healthy meals Stay active Use relaxation techniques(deep breathing,  meditating, yoga) Do Not substitute Alcohol to help with tapering If you have been on opioids for less than two weeks and do not have pain than it is ok to stop all together.  Plan to wean off of opioids This plan should start within one week post op of your joint replacement. Maintain the same interval or time between taking each dose and first decrease the dose.  Cut the total daily intake of opioids by one tablet each day Next start to increase the time between doses. The last dose that should be eliminated is the evening dose.         Allergies as of 03/16/2022   No Known Allergies      Medication List     STOP taking these medications    acetaminophen 500 MG tablet Commonly known as: TYLENOL   oxyCODONE 5 MG immediate release tablet Commonly known as: Oxy IR/ROXICODONE   solifenacin 10 MG tablet Commonly known as: VESICARE       TAKE these medications    albuterol 108 (90 Base) MCG/ACT inhaler Commonly known as: VENTOLIN HFA Inhale 1-2 puffs into the lungs every 6 (six) hours as needed for wheezing or shortness of breath.   ARIPiprazole 2 MG tablet Commonly known as: ABILIFY Take 2 mg by mouth daily.   docusate sodium 100 MG capsule Commonly known as: COLACE Take 100 mg by mouth daily as needed for moderate constipation.   estradiol 2 MG tablet Commonly known as: ESTRACE TAKE ONE TABLET AT BEDTIME   FLUoxetine 40 MG capsule Commonly known as: PROZAC Take 40 mg by mouth daily.   gabapentin 300 MG capsule Commonly known as: NEURONTIN Take 1 capsule (300 mg total) by mouth 3 (three) times daily. What changed: when to take this   hydrochlorothiazide 25 MG tablet Commonly known as: HYDRODIURIL Take 25 mg by mouth every morning.   loratadine 10 MG tablet Commonly known as: CLARITIN Take 10 mg by mouth daily as needed for allergies.   metFORMIN 500 MG tablet Commonly known as: GLUCOPHAGE Take 500 mg by mouth daily with breakfast.   methocarbamol  500 MG tablet Commonly known as: ROBAXIN Take 1 tablet (500 mg total) by mouth every 6 (six) hours as needed for muscle spasms.   multivitamin with minerals Tabs tablet Take 1 tablet by mouth daily.   omeprazole 40 MG capsule Commonly known as: PRILOSEC Take 1 capsule (40 mg total) by mouth daily.   oxyCODONE-acetaminophen 7.5-325 MG tablet Commonly known as: PERCOCET 1-2 q 4 prn pain What changed:  how much to take how to take this when to take this  reasons to take this additional instructions   polyethylene glycol 17 g packet Commonly known as: MIRALAX / GLYCOLAX Take 17 g by mouth daily.   potassium chloride SA 20 MEQ tablet Commonly known as: KLOR-CON M Take 1 tablet (20 mEq total) by mouth 2 (two) times daily.   pravastatin 40 MG tablet Commonly known as: PRAVACHOL TAKE 1 TABLET ONCE A DAY   traZODone 100 MG tablet Commonly known as: DESYREL Take 100 mg by mouth at bedtime.        Follow-up Information     Carole Civil, MD Follow up on 03/31/2022.   Specialties: Orthopedic Surgery, Radiology Contact information: [address] [place] [address] [phone]                 Signed: Arther Abbott 03/16/2022, 1:09 PM

## 2022-03-16 NOTE — Progress Notes (Signed)
Patient ID: [name], female   DOB: 31-Jul-1960, 61 y.o.   MRN: [id number]   Postop day 1 status post left total knee  This patient did very well.  She tolerated physical therapy well.  She has good limb alignment neurovascular intact soft calf  Vital signs are stable      Latest Ref Rng & Units 03/16/2022    2:47 AM 03/10/2022   12:27 PM 08/21/2020    9:25 AM  CBC  WBC 4.0 - 10.5 K/uL 19.2  10.3  10.1   Hemoglobin 12.0 - 15.0 g/dL 9.7  13.7  13.0   Hematocrit 36.0 - 46.0 % 29.4  40.9  40.5   Platelets 150 - 400 K/uL 240  246  288       Latest Ref Rng & Units 03/16/2022    2:47 AM 03/10/2022   12:27 PM 06/28/2021    4:10 PM  BMP  Glucose 70 - 99 mg/dL 180  133  128   BUN 6 - 20 mg/dL '10  7  11   '$ Creatinine 0.44 - 1.00 mg/dL 0.69  0.66  0.69   Sodium 135 - 145 mmol/L 135  135  138   Potassium 3.5 - 5.1 mmol/L 4.1  3.1  3.8   Chloride 98 - 111 mmol/L 105  99  105   CO2 22 - 32 mmol/L '24  26  25   '$ Calcium 8.9 - 10.3 mg/dL 8.2  9.1  9.1     Recommend physical therapy today The IV fluids To charge possible later this afternoon

## 2022-03-16 NOTE — Progress Notes (Signed)
Physical Therapy Treatment Patient Details Name: [name] MRN: [id number] DOB: 1960-12-28 Today's Date: 03/16/2022   LEFT KNEE ROM: 0 - 105 degrees AMBULATION DISTANCE: 200 feet using RW with Modified Independent   History of Present Illness [name]  is a 61 y/o female, s/p Left TKA on 03/15/22, with the diagnosis of Osteoarthritis left knee.    PT Comments    Patient demonstrates good return for going up/down steps in stairwell using both hands on 1 sider rail with step-to pattern without loss of balance and understanding acknowledged for proper step sequence and position of helper.  Patient demonstrates increased endurance/distance for gait training with fair/good return for left heel to toe stepping without loss of balance using RW and tolerated sitting up in chair with LLE dangling after therapy with spouse present in room.  Patient will benefit from continued skilled physical therapy in hospital and recommended venue below to increase strength, balance, endurance for safe ADLs and gait.     Recommendations for follow up therapy are one component of a multi-disciplinary discharge planning process, led by the attending physician.  Recommendations may be updated based on patient status, additional functional criteria and insurance authorization.  Follow Up Recommendations  Home health PT     Assistance Recommended at Discharge Set up Supervision/Assistance  Patient can return home with the following A little help with walking and/or transfers;A little help with bathing/dressing/bathroom;Help with stairs or ramp for entrance;Assistance with cooking/housework   Equipment Recommendations  None recommended by PT    Recommendations for Other Services       Precautions / Restrictions Precautions Precautions: Fall Restrictions Weight Bearing Restrictions: Yes LLE Weight Bearing: Weight bearing as tolerated     Mobility  Bed Mobility Overal bed mobility: Modified  Independent             General bed mobility comments: good return for moving LLE during bed  mobility    Transfers Overall transfer level: Needs assistance Equipment used: Rolling walker (2 wheels) Transfers: Sit to/from Stand, Bed to chair/wheelchair/BSC Sit to Stand: Modified independent (Device/Increase time), Supervision   Step pivot transfers: Modified independent (Device/Increase time)       General transfer comment: slightly labored movement for completing sit to stands    Ambulation/Gait Ambulation/Gait assistance: Modified independent (Device/Increase time) Gait Distance (Feet): 200 Feet Assistive device: Rolling walker (2 wheels) Gait Pattern/deviations: Decreased step length - right, Decreased step length - left, Decreased stance time - left, Antalgic, Decreased stride length Gait velocity: decreased     General Gait Details: demonstrates increased endurance/distance for gait training with fair/good return for left heel to toe stepping without los sof balance and left knee pain at baseline   Stairs Stairs: Yes Stairs assistance: Modified independent (Device/Increase time), Supervision Stair Management: One rail Right, One rail Left, Step to pattern Number of Stairs: 4 General stair comments: demonstrates good return for going up/down 4 steps using bilateral hands on 1 side rail without loss of balance and understanding acknowledged for proper step sequence and position of helper   Wheelchair Mobility    Modified Rankin (Stroke Patients Only)       Balance Overall balance assessment: Needs assistance Sitting-balance support: Feet supported, No upper extremity supported Sitting balance-Leahy Scale: Good Sitting balance - Comments: seated at EOB   Standing balance support: During functional activity, Bilateral upper extremity supported Standing balance-Leahy Scale: Good Standing balance comment: using RW  Cognition Arousal/Alertness: Awake/alert Behavior During Therapy: WFL for tasks assessed/performed Overall Cognitive Status: Within Functional Limits for tasks assessed                                          Exercises Total Joint Exercises Ankle Circles/Pumps: Supine, 10 reps, Right, Strengthening, AROM Quad Sets: AROM, Strengthening, Right, 10 reps, Supine Short Arc Quad: AROM, Strengthening, Right, 10 reps, Supine Heel Slides: AROM, Strengthening, Right, 10 reps, Supine Goniometric ROM: left knee ROM: 0 - 105 degrees    General Comments        Pertinent Vitals/Pain Pain Assessment Pain Assessment: 0-10 Pain Score: 7  Pain Location: left knee Pain Descriptors / Indicators: Sore Pain Intervention(s): Limited activity within patient's tolerance, Monitored during session, Premedicated before session, Repositioned    Home Living                          Prior Function            PT Goals (current goals can now be found in the care plan section) Acute Rehab PT Goals Patient Stated Goal: return home with family to assist PT Goal Formulation: With patient/family Time For Goal Achievement: 03/17/22 Potential to Achieve Goals: Good Progress towards PT goals: Progressing toward goals    Frequency    BID      PT Plan Current plan remains appropriate    Co-evaluation              AM-PAC PT "6 Clicks" Mobility   Outcome Measure  Help needed turning from your back to your side while in a flat bed without using bedrails?: None Help needed moving from lying on your back to sitting on the side of a flat bed without using bedrails?: None Help needed moving to and from a bed to a chair (including a wheelchair)?: A Little Help needed standing up from a chair using your arms (e.g., wheelchair or bedside chair)?: A Little Help needed to walk in hospital room?: A Little Help needed climbing 3-5 steps with a railing? : A Little 6 Click  Score: 20    End of Session Equipment Utilized During Treatment: Gait belt Activity Tolerance: Patient tolerated treatment well;Patient limited by fatigue Patient left: in chair;with call bell/phone within reach;with family/visitor present Nurse Communication: Mobility status PT Visit Diagnosis: Unsteadiness on feet (R26.81);Other abnormalities of gait and mobility (R26.89);Muscle weakness (generalized) (M62.81)     Time: 6256-3893 PT Time Calculation (min) (ACUTE ONLY): 24 min  Charges:  $Gait Training: 8-22 mins $Therapeutic Exercise: 8-22 mins                     9:42 AM, 03/16/22 Lonell Grandchild, MPT Physical Therapist with [facility] 336 [phone] office [phone] mobile phone

## 2022-03-16 NOTE — Progress Notes (Signed)
Old hemovac drain site continues to ooze sanguinous drainage. Site redressed per MD Harrision ( 2 cotton balls, 4 gauze pads, 1 folded abd pad and secured with non-perforating tape). Pt and sister advised of plan to monitor site for 1 hour to assess for any continuing bleeding. Both state understanding.

## 2022-03-16 NOTE — Care Management Obs Status (Signed)
[place] [facility] NOTIFICATION   Patient Details  Name: [name] MRN: [id number] Date of Birth: December 10, 1960   Medicare Observation Status Notification Given:  Yes    Tommy Medal 03/16/2022, 12:49 PM

## 2022-03-16 NOTE — Progress Notes (Signed)
MD Aline Brochure in at 1300 to evaluate pt and removed hemovac drain, dressing applied to site by MD. Pt aware of pending discharge, called for sister to come pick her up. Pt assisted to get dressed awaiting discharge. Nurse in to complete discharge paperwork and noted that drain site dressing was saturated with blood, dripping onto floor. Old dressing removed, area cleaned. Blood noted to be oozing continually from site. New pressure dressing of guaze and tape applied and manual pressure applied to site. MD Aline Brochure notified of same, awaiting response.

## 2022-03-16 NOTE — Plan of Care (Signed)
Problem: Education: Goal: Ability to describe self-care measures that may prevent or decrease complications (Diabetes Survival Skills Education) will improve 03/16/2022 1754 by Angelyn Punt, RN Outcome: Adequate for Discharge 03/16/2022 1552 by Angelyn Punt, RN Outcome: Progressing Goal: Individualized Educational Video(s) 03/16/2022 1754 by Angelyn Punt, RN Outcome: Adequate for Discharge 03/16/2022 1552 by Angelyn Punt, RN Outcome: Progressing   Problem: Coping: Goal: Ability to adjust to condition or change in health will improve 03/16/2022 1754 by Angelyn Punt, RN Outcome: Adequate for Discharge 03/16/2022 1552 by Angelyn Punt, RN Outcome: Progressing   Problem: Fluid Volume: Goal: Ability to maintain a balanced intake and output will improve 03/16/2022 1754 by Angelyn Punt, RN Outcome: Adequate for Discharge 03/16/2022 1552 by Angelyn Punt, RN Outcome: Progressing   Problem: Health Behavior/Discharge Planning: Goal: Ability to identify and utilize available resources and services will improve 03/16/2022 1754 by Angelyn Punt, RN Outcome: Adequate for Discharge 03/16/2022 1552 by Angelyn Punt, RN Outcome: Progressing Goal: Ability to manage health-related needs will improve 03/16/2022 1754 by Angelyn Punt, RN Outcome: Adequate for Discharge 03/16/2022 1552 by Angelyn Punt, RN Outcome: Progressing   Problem: Metabolic: Goal: Ability to maintain appropriate glucose levels will improve 03/16/2022 1754 by Angelyn Punt, RN Outcome: Adequate for Discharge 03/16/2022 1552 by Angelyn Punt, RN Outcome: Progressing   Problem: Nutritional: Goal: Maintenance of adequate nutrition will improve 03/16/2022 1754 by Angelyn Punt, RN Outcome: Adequate for Discharge 03/16/2022 1552 by Angelyn Punt, RN Outcome: Progressing Goal: Progress toward achieving an optimal weight will improve 03/16/2022 1754 by  Angelyn Punt, RN Outcome: Adequate for Discharge 03/16/2022 1552 by Angelyn Punt, RN Outcome: Progressing   Problem: Skin Integrity: Goal: Risk for impaired skin integrity will decrease 03/16/2022 1754 by Angelyn Punt, RN Outcome: Adequate for Discharge 03/16/2022 1552 by Angelyn Punt, RN Outcome: Progressing   Problem: Tissue Perfusion: Goal: Adequacy of tissue perfusion will improve 03/16/2022 1754 by Angelyn Punt, RN Outcome: Adequate for Discharge 03/16/2022 1552 by Angelyn Punt, RN Outcome: Progressing   Problem: Education: Goal: Knowledge of the prescribed therapeutic regimen will improve 03/16/2022 1754 by Angelyn Punt, RN Outcome: Adequate for Discharge 03/16/2022 1552 by Angelyn Punt, RN Outcome: Progressing Goal: Individualized Educational Video(s) 03/16/2022 1754 by Angelyn Punt, RN Outcome: Adequate for Discharge 03/16/2022 1552 by Angelyn Punt, RN Outcome: Progressing   Problem: Activity: Goal: Ability to avoid complications of mobility impairment will improve 03/16/2022 1754 by Angelyn Punt, RN Outcome: Adequate for Discharge 03/16/2022 1552 by Angelyn Punt, RN Outcome: Progressing Goal: Range of joint motion will improve 03/16/2022 1754 by Angelyn Punt, RN Outcome: Adequate for Discharge 03/16/2022 1552 by Angelyn Punt, RN Outcome: Progressing   Problem: Clinical Measurements: Goal: Postoperative complications will be avoided or minimized 03/16/2022 1754 by Angelyn Punt, RN Outcome: Adequate for Discharge 03/16/2022 1552 by Angelyn Punt, RN Outcome: Progressing   Problem: Pain Management: Goal: Pain level will decrease with appropriate interventions 03/16/2022 1754 by Angelyn Punt, RN Outcome: Adequate for Discharge 03/16/2022 1552 by Angelyn Punt, RN Outcome: Progressing   Problem: Skin Integrity: Goal: Will show signs of wound healing 03/16/2022 1754 by  Angelyn Punt, RN Outcome: Adequate for Discharge 03/16/2022 1552 by Angelyn Punt, RN Outcome: Progressing   Problem: Education: Goal: Knowledge of General Education information will improve Description: Including pain rating scale, medication(s)/side effects and non-pharmacologic comfort  measures 03/16/2022 1754 by Angelyn Punt, RN Outcome: Adequate for Discharge 03/16/2022 1552 by Angelyn Punt, RN Outcome: Progressing   Problem: Health Behavior/Discharge Planning: Goal: Ability to manage health-related needs will improve 03/16/2022 1754 by Angelyn Punt, RN Outcome: Adequate for Discharge 03/16/2022 1552 by Angelyn Punt, RN Outcome: Progressing   Problem: Clinical Measurements: Goal: Ability to maintain clinical measurements within normal limits will improve 03/16/2022 1754 by Angelyn Punt, RN Outcome: Adequate for Discharge 03/16/2022 1552 by Angelyn Punt, RN Outcome: Progressing Goal: Will remain free from infection 03/16/2022 1754 by Angelyn Punt, RN Outcome: Adequate for Discharge 03/16/2022 1552 by Angelyn Punt, RN Outcome: Progressing Goal: Diagnostic test results will improve 03/16/2022 1754 by Angelyn Punt, RN Outcome: Adequate for Discharge 03/16/2022 1552 by Angelyn Punt, RN Outcome: Progressing Goal: Respiratory complications will improve 03/16/2022 1754 by Angelyn Punt, RN Outcome: Adequate for Discharge 03/16/2022 1552 by Angelyn Punt, RN Outcome: Progressing Goal: Cardiovascular complication will be avoided 03/16/2022 1754 by Angelyn Punt, RN Outcome: Adequate for Discharge 03/16/2022 1552 by Angelyn Punt, RN Outcome: Progressing   Problem: Activity: Goal: Risk for activity intolerance will decrease 03/16/2022 1754 by Angelyn Punt, RN Outcome: Adequate for Discharge 03/16/2022 1552 by Angelyn Punt, RN Outcome: Progressing   Problem: Nutrition: Goal: Adequate  nutrition will be maintained 03/16/2022 1754 by Angelyn Punt, RN Outcome: Adequate for Discharge 03/16/2022 1552 by Angelyn Punt, RN Outcome: Progressing   Problem: Coping: Goal: Level of anxiety will decrease 03/16/2022 1754 by Angelyn Punt, RN Outcome: Adequate for Discharge 03/16/2022 1552 by Angelyn Punt, RN Outcome: Progressing   Problem: Elimination: Goal: Will not experience complications related to bowel motility 03/16/2022 1754 by Angelyn Punt, RN Outcome: Adequate for Discharge 03/16/2022 1552 by Angelyn Punt, RN Outcome: Progressing Goal: Will not experience complications related to urinary retention 03/16/2022 1754 by Angelyn Punt, RN Outcome: Adequate for Discharge 03/16/2022 1552 by Angelyn Punt, RN Outcome: Progressing   Problem: Pain Managment: Goal: General experience of comfort will improve 03/16/2022 1754 by Angelyn Punt, RN Outcome: Adequate for Discharge 03/16/2022 1552 by Angelyn Punt, RN Outcome: Progressing   Problem: Safety: Goal: Ability to remain free from injury will improve 03/16/2022 1754 by Angelyn Punt, RN Outcome: Adequate for Discharge 03/16/2022 1552 by Angelyn Punt, RN Outcome: Progressing   Problem: Skin Integrity: Goal: Risk for impaired skin integrity will decrease 03/16/2022 1754 by Angelyn Punt, RN Outcome: Adequate for Discharge 03/16/2022 1552 by Angelyn Punt, RN Outcome: Progressing   Problem: Acute Rehab PT Goals(only PT should resolve) Goal: Pt Will Go Supine/Side To Sit Outcome: Adequate for Discharge Goal: Patient Will Transfer Sit To/From Stand Outcome: Adequate for Discharge Goal: Pt Will Transfer Bed To Chair/Chair To Bed Outcome: Adequate for Discharge Goal: Pt Will Ambulate Outcome: Adequate for Discharge

## 2022-03-16 NOTE — Progress Notes (Signed)
Dressing to previous hemovac site checked, only small amount serosanguinous drainage noted on cotton balls, otherwise dry. New abd pad applied and secured with tape. MD Aline Brochure notified and states pt OK to go home. Advises to tell pt and family if any continued bleeding from site that soaks through the outer abd dressing to reapply new dressing as outlined prior. Pt, her husband and their granddaughter all instructed on dressing change, husband and granddaughter state understanding. Dressing supplies given to family per MD Aline Brochure order.

## 2022-03-16 NOTE — Progress Notes (Signed)
Pt back to room after ambulating in hallway and in stairway with PT. Pt tolerated well. Currently sitting up in chair, hemovac and ice-man cooling mat intact.

## 2022-03-16 NOTE — Plan of Care (Signed)

## 2022-03-16 NOTE — Progress Notes (Signed)
[facility] staff reports finding pair of bedroom slippers under bed in pt's room. Slippers placed in belonging bag and put in dirty utility room cabinet. Called pts family and advises slippers found and available for pickup.

## 2022-03-22 ENCOUNTER — Ambulatory Visit (INDEPENDENT_AMBULATORY_CARE_PROVIDER_SITE_OTHER): Payer: Medicare Other | Admitting: Orthopaedic Surgery

## 2022-03-22 ENCOUNTER — Telehealth: Payer: Self-pay | Admitting: Orthopedic Surgery

## 2022-03-22 ENCOUNTER — Encounter: Payer: Medicare Other | Admitting: Orthopaedic Surgery

## 2022-03-22 ENCOUNTER — Encounter (HOSPITAL_COMMUNITY): Payer: Self-pay | Admitting: Orthopedic Surgery

## 2022-03-22 DIAGNOSIS — R32 Unspecified urinary incontinence: Secondary | ICD-10-CM | POA: Insufficient documentation

## 2022-03-22 DIAGNOSIS — R609 Edema, unspecified: Secondary | ICD-10-CM | POA: Insufficient documentation

## 2022-03-22 DIAGNOSIS — F419 Anxiety disorder, unspecified: Secondary | ICD-10-CM | POA: Insufficient documentation

## 2022-03-22 DIAGNOSIS — N951 Menopausal and female climacteric states: Secondary | ICD-10-CM | POA: Insufficient documentation

## 2022-03-22 DIAGNOSIS — E119 Type 2 diabetes mellitus without complications: Secondary | ICD-10-CM | POA: Insufficient documentation

## 2022-03-22 DIAGNOSIS — Z96652 Presence of left artificial knee joint: Secondary | ICD-10-CM

## 2022-03-22 DIAGNOSIS — K219 Gastro-esophageal reflux disease without esophagitis: Secondary | ICD-10-CM | POA: Insufficient documentation

## 2022-03-22 DIAGNOSIS — G8929 Other chronic pain: Secondary | ICD-10-CM | POA: Insufficient documentation

## 2022-03-22 DIAGNOSIS — E785 Hyperlipidemia, unspecified: Secondary | ICD-10-CM | POA: Insufficient documentation

## 2022-03-22 DIAGNOSIS — J329 Chronic sinusitis, unspecified: Secondary | ICD-10-CM | POA: Insufficient documentation

## 2022-03-22 DIAGNOSIS — F319 Bipolar disorder, unspecified: Secondary | ICD-10-CM | POA: Insufficient documentation

## 2022-03-22 DIAGNOSIS — G4700 Insomnia, unspecified: Secondary | ICD-10-CM | POA: Insufficient documentation

## 2022-03-22 DIAGNOSIS — J45909 Unspecified asthma, uncomplicated: Secondary | ICD-10-CM | POA: Insufficient documentation

## 2022-03-22 HISTORY — DX: Type 2 diabetes mellitus without complications: E11.9

## 2022-03-22 NOTE — Telephone Encounter (Signed)
Patient called back after having an appointment today, 03/22/22, for wound check, status/post total knee surgery, then called to request to come tomorrow, both of which had been scheduled. Patient just called back asking for today after all; said not sure she can wait after all. Added back onto Dr Brooke Bonito schedule today, 03/22/22, our administrator Abigail Butts aware, and patient is aware Dr Aline Brochure is in surgery and not in clinic; patient on way to office.

## 2022-03-22 NOTE — Progress Notes (Signed)
She called to be seen asap.  She had total knee by Dr. Aline Brochure one week ago on 03-15-22.  She has had oozing of fluid from the drain site that does not appear it will stop.  She has no fever, no chills, no trauma. She is using a walker and using the continuous passive flexion machine at home.  He husband accompanies her.  She has slight drainage from the drain site, no redness, no odor.  Wound was inspected, dressing removed.  She has no redness.  ROM is very good from 0 to 105.  NV intact.  I have reassured her and her husband.  The wounds look good, no signs of infection.  New dressings applied.  She would like to see Dr. Aline Brochure tomorrow morning and I will arrange that.  Encounter Diagnosis  Name Primary?   Status post revision of total knee, left Yes   Return tomorrow.  Call if any problem.  Precautions discussed.  Electronically Signed Sanjuana Kava, MD 9/12/20233:41 PM

## 2022-03-23 ENCOUNTER — Ambulatory Visit (INDEPENDENT_AMBULATORY_CARE_PROVIDER_SITE_OTHER): Payer: Medicare Other | Admitting: Orthopedic Surgery

## 2022-03-23 ENCOUNTER — Encounter: Payer: Self-pay | Admitting: Orthopedic Surgery

## 2022-03-23 DIAGNOSIS — Z96652 Presence of left artificial knee joint: Secondary | ICD-10-CM

## 2022-03-24 NOTE — Progress Notes (Signed)
FOLLOW UP   Encounter Diagnosis  Name Primary?   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 Yes     Chief Complaint  Patient presents with   Post-op Follow-up    Left knee no bleeding on bandage 03/15/22 date of surgery      [name] comes in today for evaluation of her wound therapist thought that there may be some bleeding  Wound was checked there was no excessive bleeding suture line with good patient is wearing her hose taking her appropriate DVT prophylaxis  Follow-up as previously scheduled for staple removal

## 2022-03-29 ENCOUNTER — Other Ambulatory Visit: Payer: Self-pay

## 2022-03-29 ENCOUNTER — Ambulatory Visit: Payer: Medicare Other | Attending: Orthopedic Surgery

## 2022-03-29 DIAGNOSIS — M1712 Unilateral primary osteoarthritis, left knee: Secondary | ICD-10-CM | POA: Insufficient documentation

## 2022-03-29 DIAGNOSIS — M25662 Stiffness of left knee, not elsewhere classified: Secondary | ICD-10-CM | POA: Insufficient documentation

## 2022-03-29 DIAGNOSIS — M25562 Pain in left knee: Secondary | ICD-10-CM | POA: Diagnosis present

## 2022-03-29 NOTE — Therapy (Signed)
OUTPATIENT PHYSICAL THERAPY LOWER EXTREMITY EVALUATION   Patient Name: [name] MRN: [id number] DOB:09-Mar-1961, 61 y.o., female Today's Date: 03/29/2022   PT End of Session - 03/29/22 0903     Visit Number 1    Number of Visits 12    Date for PT Re-Evaluation 05/13/22    PT Start Time 0901    PT Stop Time 0936    PT Time Calculation (min) 35 min    Activity Tolerance Patient tolerated treatment well    Behavior During Therapy [facility] for tasks assessed/performed             Past Medical History:  Diagnosis Date   Anxiety    Arthritis    Asthma    Cancer ([place])    cervical cancer; pt states a long long time ago   Depression    Diabetes mellitus without complication ([place])    Essential (primary) hypertension 06/24/2020   Hyperlipemia    Past Surgical History:  Procedure Laterality Date   ABDOMINAL HYSTERECTOMY     ANTERIOR LAT LUMBAR FUSION N/A 11/05/2019   Procedure: Lumbar Four-Five Anterolateral lumbar interbody fusion with lateral plate;  Surgeon: Kristeen Miss, MD;  Location: [place];  Service: Neurosurgery;  Laterality: N/A;  anterolateral   BACK SURGERY     pins   BACK SURGERY  07/11/2008   BIOPSY  06/29/2021   Procedure: BIOPSY;  Surgeon: Harvel Quale, MD;  [facility];  Service: Gastroenterology;;   BLADDER REPAIR     CARPAL TUNNEL RELEASE Right    [place]   COLONOSCOPY N/A 06/12/2013   rehman: normal   ESOPHAGOGASTRODUODENOSCOPY (EGD) WITH PROPOFOL N/A 06/29/2021   Procedure: ESOPHAGOGASTRODUODENOSCOPY (EGD) WITH PROPOFOL;  Surgeon: Harvel Quale, MD;  [facility];  Service: Gastroenterology;  Laterality: N/A;  8:40   KNEE ARTHROSCOPY WITH MEDIAL MENISECTOMY Right 05/09/2019   Procedure: KNEE ARTHROSCOPY WITH MEDIAL MENISECTOMY AND LATERAL MENISECTOMY;  Surgeon: Carole Civil, MD;  [facility];  Service: Orthopedics;  Laterality: Right;   KNEE ARTHROSCOPY WITH MEDIAL  MENISECTOMY Left 06/27/2019   Procedure: KNEE ARTHROSCOPY WITH MEDIAL MENISCECTOMY;  Surgeon: Carole Civil, MD;  Location[facility];  Service: Orthopedics;  Laterality: Left;   TOTAL KNEE ARTHROPLASTY Left 03/15/2022   Procedure: TOTAL KNEE ARTHROPLASTY;  Surgeon: Carole Civil, MD;  Location[facility];  Service: Orthopedics;  Laterality: Left;   Patient Active Problem List   Diagnosis Date Noted   Anxiety 03/22/2022   Asthma 03/22/2022   Bipolar 1 disorder ([place]) 03/22/2022   Chronic pain 03/22/2022   Edema 03/22/2022   Gastro-esophageal reflux 03/22/2022   Hyperlipidemia 03/22/2022   Insomnia 03/22/2022   Menopausal symptom 03/22/2022   Sinusitis 03/22/2022   Type II diabetes mellitus ([place]) 03/22/2022   Urinary incontinence 03/22/2022   Osteoarthritis of left knee 03/15/2022   History of operative procedure on lumbosacral spinal structure 02/15/2022   Urge incontinence 09/02/2021   Cervical radiculopathy 12/30/2020   Pseudoarthrosis of lumbar spine 08/25/2020   Essential (primary) hypertension 06/24/2020   Body mass index (BMI) 45.0-49.9, adult ([place]) 05/06/2020   Carpal tunnel syndrome 04/01/2020   Spondylolisthesis, lumbar region 11/22/2019   Localized swelling of both lower legs 11/12/2019   Lumbar stenosis 11/05/2019   S/P left knee arthroscopy 06/27/2019 07/01/2019   Derangement of posterior horn of medial meniscus of left knee    Primary osteoarthritis of left knee    S/P right knee arthroscopy 05/09/19 05/29/2019  Depression 07/24/2014   LOWER LEG, ARTHRITIS, DEGEN./OSTEO 02/18/2009   DERANGEMENT MENISCUS 02/18/2009   ANSERINE BURSITIS, RIGHT 02/18/2009    PCP: Adaline Sill, NP  REFERRING PROVIDER: Carole Civil, MD  REFERRING DIAG: Primary osteoarthritis of left knee  THERAPY DIAG:  Stiffness of left knee, not elsewhere classified  Acute pain of left knee  Rationale for Evaluation and Treatment Rehabilitation  ONSET DATE:  03/15/22  SUBJECTIVE:   SUBJECTIVE STATEMENT: Patient reports that she had a left total knee replacement on 03/15/22. She had about 4 home health visits. She notes that she had cut her compression sock to help ease the pressure and pain in her leg. She has a follow up with her surgeon on 9/21 to get her stitches removed. She notes that she is still doing the exercises that she was given by her home health therapist.   PERTINENT HISTORY: HTN, asthma, DM, OA, depression, anxiety, bipolar, chronic pain  PAIN:  Are you having pain? Yes: NPRS scale: 8/10 Pain location: left knee Pain description: constant throbbing and squeezing Aggravating factors: compression stocking, walking Relieving factors: ice,medication   PRECAUTIONS: None  WEIGHT BEARING RESTRICTIONS No  FALLS:  Has patient fallen in last 6 months? No  LIVING ENVIRONMENT: Lives with: lives with their family Lives in: House/apartment Stairs: Yes: External: 3 steps; on left going up; with step to pattern Has following equipment at home: [place] - 2 wheeled  OCCUPATION: disabled  PLOF: [place] reduced pain, improved mobility   OBJECTIVE:   PATIENT SURVEYS:  FOTO 29.65  COGNITION:  Overall cognitive status: Within functional limits for tasks assessed     SENSATION: Patient reports rare numbness and tingling in left knee   EDEMA:  Moderate LLE edema   POSTURE: rounded shoulders and forward head  PALPATION: TTP: left quadriceps, hamstring, IT band, and hip adductors  LOWER EXTREMITY ROM:  Active ROM Right eval Left AROM/ PROM eval  Hip flexion    Hip extension    Hip abduction    Hip adduction    Hip internal rotation    Hip external rotation    Knee flexion 127 108/114  Knee extension 0 14  Ankle dorsiflexion    Ankle plantarflexion    Ankle inversion    Ankle eversion     (Blank rows = not tested)  GAIT: Assistive device utilized: Environmental consultant - 2 wheeled Level of assistance:  Modified independence Comments: reduced gait speed, stride length, absent heel strike or toe off on the left   TODAY'S TREATMENT:                                   9/19 EXERCISE LOG  Exercise Repetitions and Resistance Comments  Quad set  15 reps w/ 5 second hold    Gastroc stretch 4 x 30 seconds   SLR  15 reps            Blank cell = exercise not performed today    PATIENT EDUCATION:  Education details: avoid sleeping with a pillow behind her knee, POC, HEP, prognosis  Person educated: Patient Education method: Explanation Education comprehension: verbalized understanding   HOME EXERCISE PROGRAM: Reviewed home health HEP   ASSESSMENT:  CLINICAL IMPRESSION: Patient is a 61 y.o. female who was seen today for physical therapy evaluation and treatment following a left total knee arthroplasty on 03/15/22. She presented with moderate pain severity and irritability with  left knee AROM being the most aggravating to her familiar pain. She continues to exhibited significant gait deviations and ambulating with a rolling walker. Recommend that she continue with skilled physical therapy to address her remaining impairments to return to her prior level of function.    OBJECTIVE IMPAIRMENTS Abnormal gait, decreased activity tolerance, decreased balance, decreased mobility, difficulty walking, decreased ROM, decreased strength, hypomobility, increased edema, impaired flexibility, and pain.   ACTIVITY LIMITATIONS carrying, lifting, standing, squatting, stairs, transfers, bed mobility, dressing, and locomotion level  PARTICIPATION LIMITATIONS: meal prep, cleaning, laundry, and shopping  PERSONAL FACTORS Transportation and 3+ comorbidities: HTN, asthma, DM, OA, depression, anxiety, bipolar, chronic pain  are also affecting patient's functional outcome.   REHAB POTENTIAL: Good  CLINICAL DECISION MAKING: Stable/uncomplicated  EVALUATION COMPLEXITY: Low   GOALS: Goals reviewed with patient?  No  SHORT TERM GOALS: Target date: 04/19/2022  Patient will be independent with her initial HEP.  Baseline: Goal status: INITIAL  2.  Patient will improve her left knee extension within 10 degrees of neutral.  Baseline:  Goal status: INITIAL  LONG TERM GOALS: Target date: 05/10/2022   Patient will be independent with her advanced HEP.  Baseline:  Goal status: INITIAL  2.  Patient will improve her active left knee flexion to at least 120 degrees for improved function navigating stairs.  Baseline:  Goal status: INITIAL  3.  Patient will be able to demonstrate active left knee extension within 5 degrees of neutral for improved gait mechanics.  Baseline:  Goal status: INITIAL  4.  Patient will be able to will be able to safely ambulate at least 80 feet with a cane or the least restrictive assistive device.  Baseline:  Goal status: INITIAL  PLAN: PT FREQUENCY: 2x/week  PT DURATION: 6 weeks  PLANNED INTERVENTIONS: Therapeutic exercises, Therapeutic activity, Neuromuscular re-education, Balance training, Gait training, Patient/Family education, Self Care, Joint mobilization, Stair training, Electrical stimulation, Cryotherapy, Moist heat, Vasopneumatic device, Manual therapy, and Re-evaluation  PLAN FOR NEXT SESSION: nustep or recumbent bike, quadriceps strengthening, and modalities as needed   Darlin Coco, PT 03/29/2022, 12:47 PM

## 2022-03-31 ENCOUNTER — Ambulatory Visit (INDEPENDENT_AMBULATORY_CARE_PROVIDER_SITE_OTHER): Payer: Medicare Other | Admitting: Orthopedic Surgery

## 2022-03-31 ENCOUNTER — Encounter: Payer: Self-pay | Admitting: Orthopedic Surgery

## 2022-03-31 DIAGNOSIS — Z96652 Presence of left artificial knee joint: Secondary | ICD-10-CM

## 2022-03-31 NOTE — Patient Instructions (Signed)
Take 2 Aspirin 81 mg per day

## 2022-03-31 NOTE — Progress Notes (Signed)
Postop appointment  Chief Complaint  Patient presents with   Follow-up    Recheck on left knee, TKA, DOS 03-15-22.staples removed, patient states she can not wear the TED hose and she was told not to take Aspirin     Encounter Diagnosis  Name Primary?   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  Her staples were taken out her wound looks good she can now bend her knee past 90 degrees.  She was told by the hospital not to take aspirin this was not known to me and she also took her TED hose off.  So she has had 2 weeks no DVT prevention  She is advised to take an aspirin twice a day go to physical therapy return in 2 weeks

## 2022-04-01 ENCOUNTER — Ambulatory Visit: Payer: Medicare Other

## 2022-04-01 DIAGNOSIS — M25662 Stiffness of left knee, not elsewhere classified: Secondary | ICD-10-CM

## 2022-04-01 DIAGNOSIS — M25562 Pain in left knee: Secondary | ICD-10-CM

## 2022-04-01 NOTE — Therapy (Signed)
OUTPATIENT PHYSICAL THERAPY LOWER EXTREMITY EVALUATION   Patient Name: [name] MRN: [id number] DOB:03-11-1961, 61 y.o., female Today's Date: 04/01/2022   PT End of Session - 04/01/22 0819     Visit Number 2    Number of Visits 12    Date for PT Re-Evaluation 05/13/22    PT Start Time 0815    Activity Tolerance Patient tolerated treatment well    Behavior During Therapy [facility] for tasks assessed/performed             Past Medical History:  Diagnosis Date   Anxiety    Arthritis    Asthma    Cancer ([place])    cervical cancer; pt states a long long time ago   Depression    Diabetes mellitus without complication ([place])    Essential (primary) hypertension 06/24/2020   Hyperlipemia    Past Surgical History:  Procedure Laterality Date   ABDOMINAL HYSTERECTOMY     ANTERIOR LAT LUMBAR FUSION N/A 11/05/2019   Procedure: Lumbar Four-Five Anterolateral lumbar interbody fusion with lateral plate;  Surgeon: Kristeen Miss, MD;  Location: [place];  Service: Neurosurgery;  Laterality: N/A;  anterolateral   BACK SURGERY     pins   BACK SURGERY  07/11/2008   BIOPSY  06/29/2021   Procedure: BIOPSY;  Surgeon: Harvel Quale, MD;  [facility];  Service: Gastroenterology;;   BLADDER REPAIR     CARPAL TUNNEL RELEASE Right    [place]   COLONOSCOPY N/A 06/12/2013   rehman: normal   ESOPHAGOGASTRODUODENOSCOPY (EGD) WITH PROPOFOL N/A 06/29/2021   Procedure: ESOPHAGOGASTRODUODENOSCOPY (EGD) WITH PROPOFOL;  Surgeon: Harvel Quale, MD;  [facility];  Service: Gastroenterology;  Laterality: N/A;  8:40   KNEE ARTHROSCOPY WITH MEDIAL MENISECTOMY Right 05/09/2019   Procedure: KNEE ARTHROSCOPY WITH MEDIAL MENISECTOMY AND LATERAL MENISECTOMY;  Surgeon: Carole Civil, MD;  [facility];  Service: Orthopedics;  Laterality: Right;   KNEE ARTHROSCOPY WITH MEDIAL MENISECTOMY Left 06/27/2019   Procedure: KNEE ARTHROSCOPY WITH  MEDIAL MENISCECTOMY;  Surgeon: Carole Civil, MD;  [facility];  Service: Orthopedics;  Laterality: Left;   TOTAL KNEE ARTHROPLASTY Left 03/15/2022   Procedure: TOTAL KNEE ARTHROPLASTY;  Surgeon: Carole Civil, MD;  [facility];  Service: Orthopedics;  Laterality: Left;   Patient Active Problem List   Diagnosis Date Noted   Anxiety 03/22/2022   Asthma 03/22/2022   Bipolar 1 disorder ([place]) 03/22/2022   Chronic pain 03/22/2022   Edema 03/22/2022   Gastro-esophageal reflux 03/22/2022   Hyperlipidemia 03/22/2022   Insomnia 03/22/2022   Menopausal symptom 03/22/2022   Sinusitis 03/22/2022   Type II diabetes mellitus ([place]) 03/22/2022   Urinary incontinence 03/22/2022   Osteoarthritis of left knee 03/15/2022   History of operative procedure on lumbosacral spinal structure 02/15/2022   Urge incontinence 09/02/2021   Cervical radiculopathy 12/30/2020   Pseudoarthrosis of lumbar spine 08/25/2020   Essential (primary) hypertension 06/24/2020   Body mass index (BMI) 45.0-49.9, adult ([place]) 05/06/2020   Carpal tunnel syndrome 04/01/2020   Spondylolisthesis, lumbar region 11/22/2019   Localized swelling of both lower legs 11/12/2019   Lumbar stenosis 11/05/2019   S/P left knee arthroscopy 06/27/2019 07/01/2019   Derangement of posterior horn of medial meniscus of left knee    Primary osteoarthritis of left knee    S/P right knee arthroscopy 05/09/19 05/29/2019   Depression 07/24/2014   LOWER LEG, ARTHRITIS, DEGEN./OSTEO 02/18/2009   DERANGEMENT MENISCUS 02/18/2009  ANSERINE BURSITIS, RIGHT 02/18/2009    PCP: Adaline Sill, NP  REFERRING PROVIDER: Carole Civil, MD  REFERRING DIAG: Primary osteoarthritis of left knee  THERAPY DIAG:  Stiffness of left knee, not elsewhere classified  Acute pain of left knee  Rationale for Evaluation and Treatment Rehabilitation  ONSET DATE: 03/15/22  SUBJECTIVE:   SUBJECTIVE STATEMENT: Patient reports that she had  a left total knee replacement on 03/15/22. She had about 4 home health visits. She notes that she had cut her compression sock to help ease the pressure and pain in her leg. She has a follow up with her surgeon on 9/21 to get her stitches removed. She notes that she is still doing the exercises that she was given by her home health therapist.   PERTINENT HISTORY: HTN, asthma, DM, OA, depression, anxiety, bipolar, chronic pain  PAIN:  Are you having pain? Yes: NPRS scale: 8/10 Pain location: left knee Pain description: constant throbbing and squeezing Aggravating factors: compression stocking, walking Relieving factors: ice,medication   PRECAUTIONS: None  WEIGHT BEARING RESTRICTIONS No  FALLS:  Has patient fallen in last 6 months? No  LIVING ENVIRONMENT: Lives with: lives with their family Lives in: House/apartment Stairs: Yes: External: 3 steps; on left going up; with step to pattern Has following equipment at home: [place] - 2 wheeled  OCCUPATION: disabled  PLOF: [place] reduced pain, improved mobility   OBJECTIVE:   PATIENT SURVEYS:  FOTO 29.65  COGNITION:  Overall cognitive status: Within functional limits for tasks assessed     SENSATION: Patient reports rare numbness and tingling in left knee   EDEMA:  Moderate LLE edema   POSTURE: rounded shoulders and forward head  PALPATION: TTP: left quadriceps, hamstring, IT band, and hip adductors  LOWER EXTREMITY ROM:  Active ROM Right eval Left AROM/ PROM eval  Hip flexion    Hip extension    Hip abduction    Hip adduction    Hip internal rotation    Hip external rotation    Knee flexion 127 108/114  Knee extension 0 14  Ankle dorsiflexion    Ankle plantarflexion    Ankle inversion    Ankle eversion     (Blank rows = not tested)  GAIT: Assistive device utilized: Environmental consultant - 2 wheeled Level of assistance: Modified independence Comments: reduced gait speed, stride length, absent heel  strike or toe off on the left   TODAY'S TREATMENT:                                   9/22 EXERCISE LOG  Exercise Repetitions and Resistance Comments  Nustep Lvl 3 x 15 mins   Quad set  20 reps w/ 5 second hold    Gastroc stretch 5 x 30 seconds   Heel Slides 20 reps RLE   SLR  15 reps   LAQ 20 reps RLE Cues to avoid leaning  Seated Marching 20 reps RLE    Blank cell = exercise not performed today   Manual Therapy Soft Tissue Mobilization: left knee, STW/M to medial and lateral left knee to decrease pain and tone.  Pt positioned in supine with RLE elevated on wedge  Modalities  Date:  Unattended Estim: Knee, IFC 80-150 Hz, 15 mins, Pain and Edema Vaso: Knee, 34 degrees, 15 mins, Pain and Edema    PATIENT EDUCATION:  Education details: avoid sleeping with a pillow behind her  knee, POC, HEP, prognosis  Person educated: Patient Education method: Explanation Education comprehension: verbalized understanding   HOME EXERCISE PROGRAM: Reviewed home health HEP   ASSESSMENT:  CLINICAL IMPRESSION: Pt arrives for today's treatment session reporting 7/10 left knee pain.  Pt instructed in several seated and supine exercises to increase strength and function in left knee.  STW/M to medial and lateral left knee to decrease pain and tone.  Normal responses to estim and vaso noted upon removal. Pt reported 5/10 left knee pain at completion of today's treatment session.   OBJECTIVE IMPAIRMENTS Abnormal gait, decreased activity tolerance, decreased balance, decreased mobility, difficulty walking, decreased ROM, decreased strength, hypomobility, increased edema, impaired flexibility, and pain.   ACTIVITY LIMITATIONS carrying, lifting, standing, squatting, stairs, transfers, bed mobility, dressing, and locomotion level  PARTICIPATION LIMITATIONS: meal prep, cleaning, laundry, and shopping  PERSONAL FACTORS Transportation and 3+ comorbidities: HTN, asthma, DM, OA, depression, anxiety,  bipolar, chronic pain  are also affecting patient's functional outcome.   REHAB POTENTIAL: Good  CLINICAL DECISION MAKING: Stable/uncomplicated  EVALUATION COMPLEXITY: Low   GOALS: Goals reviewed with patient? No  SHORT TERM GOALS: Target date: 04/19/2022  Patient will be independent with her initial HEP.  Baseline: Goal status: INITIAL  2.  Patient will improve her left knee extension within 10 degrees of neutral.  Baseline:  Goal status: INITIAL  LONG TERM GOALS: Target date: 05/10/2022   Patient will be independent with her advanced HEP.  Baseline:  Goal status: INITIAL  2.  Patient will improve her active left knee flexion to at least 120 degrees for improved function navigating stairs.  Baseline:  Goal status: INITIAL  3.  Patient will be able to demonstrate active left knee extension within 5 degrees of neutral for improved gait mechanics.  Baseline:  Goal status: INITIAL  4.  Patient will be able to will be able to safely ambulate at least 80 feet with a cane or the least restrictive assistive device.  Baseline:  Goal status: INITIAL  PLAN: PT FREQUENCY: 2x/week  PT DURATION: 6 weeks  PLANNED INTERVENTIONS: Therapeutic exercises, Therapeutic activity, Neuromuscular re-education, Balance training, Gait training, Patient/Family education, Self Care, Joint mobilization, Stair training, Electrical stimulation, Cryotherapy, Moist heat, Vasopneumatic device, Manual therapy, and Re-evaluation  PLAN FOR NEXT SESSION: nustep or recumbent bike, quadriceps strengthening, and modalities as needed   Kathrynn Ducking, PTA 04/01/2022, 9:16 AM

## 2022-04-05 ENCOUNTER — Ambulatory Visit: Payer: Medicare Other

## 2022-04-05 DIAGNOSIS — M25662 Stiffness of left knee, not elsewhere classified: Secondary | ICD-10-CM | POA: Diagnosis not present

## 2022-04-05 DIAGNOSIS — M25562 Pain in left knee: Secondary | ICD-10-CM

## 2022-04-05 NOTE — Therapy (Signed)
OUTPATIENT PHYSICAL THERAPY LOWER EXTREMITY TREATMENT   Patient Name: [name] MRN: [id number] DOB:April 19, 1961, 61 y.o., female Today's Date: 04/05/2022   PT End of Session - 04/05/22 0816     Visit Number 3    Number of Visits 12    Date for PT Re-Evaluation 05/13/22    PT Start Time 0815    PT Stop Time 0905    PT Time Calculation (min) 50 min    Activity Tolerance Patient tolerated treatment well    Behavior During Therapy [facility] for tasks assessed/performed              Past Medical History:  Diagnosis Date   Anxiety    Arthritis    Asthma    Cancer ([place])    cervical cancer; pt states a long long time ago   Depression    Diabetes mellitus without complication ([place])    Essential (primary) hypertension 06/24/2020   Hyperlipemia    Past Surgical History:  Procedure Laterality Date   ABDOMINAL HYSTERECTOMY     ANTERIOR LAT LUMBAR FUSION N/A 11/05/2019   Procedure: Lumbar Four-Five Anterolateral lumbar interbody fusion with lateral plate;  Surgeon: Kristeen Miss, MD;  Location: [place];  Service: Neurosurgery;  Laterality: N/A;  anterolateral   BACK SURGERY     pins   BACK SURGERY  07/11/2008   BIOPSY  06/29/2021   Procedure: BIOPSY;  Surgeon: Harvel Quale, MD;  [facility];  Service: Gastroenterology;;   BLADDER REPAIR     CARPAL TUNNEL RELEASE Right    [place]   COLONOSCOPY N/A 06/12/2013   rehman: normal   ESOPHAGOGASTRODUODENOSCOPY (EGD) WITH PROPOFOL N/A 06/29/2021   Procedure: ESOPHAGOGASTRODUODENOSCOPY (EGD) WITH PROPOFOL;  Surgeon: Harvel Quale, MD;  [facility];  Service: Gastroenterology;  Laterality: N/A;  8:40   KNEE ARTHROSCOPY WITH MEDIAL MENISECTOMY Right 05/09/2019   Procedure: KNEE ARTHROSCOPY WITH MEDIAL MENISECTOMY AND LATERAL MENISECTOMY;  Surgeon: Carole Civil, MD;  [facility];  Service: Orthopedics;  Laterality: Right;   KNEE ARTHROSCOPY WITH MEDIAL  MENISECTOMY Left 06/27/2019   Procedure: KNEE ARTHROSCOPY WITH MEDIAL MENISCECTOMY;  Surgeon: Carole Civil, MD;  [facility];  Service: Orthopedics;  Laterality: Left;   TOTAL KNEE ARTHROPLASTY Left 03/15/2022   Procedure: TOTAL KNEE ARTHROPLASTY;  Surgeon: Carole Civil, MD;  [facility];  Service: Orthopedics;  Laterality: Left;   Patient Active Problem List   Diagnosis Date Noted   Anxiety 03/22/2022   Asthma 03/22/2022   Bipolar 1 disorder ([place]) 03/22/2022   Chronic pain 03/22/2022   Edema 03/22/2022   Gastro-esophageal reflux 03/22/2022   Hyperlipidemia 03/22/2022   Insomnia 03/22/2022   Menopausal symptom 03/22/2022   Sinusitis 03/22/2022   Type II diabetes mellitus ([place]) 03/22/2022   Urinary incontinence 03/22/2022   Osteoarthritis of left knee 03/15/2022   History of operative procedure on lumbosacral spinal structure 02/15/2022   Urge incontinence 09/02/2021   Cervical radiculopathy 12/30/2020   Pseudoarthrosis of lumbar spine 08/25/2020   Essential (primary) hypertension 06/24/2020   Body mass index (BMI) 45.0-49.9, adult ([place]) 05/06/2020   Carpal tunnel syndrome 04/01/2020   Spondylolisthesis, lumbar region 11/22/2019   Localized swelling of both lower legs 11/12/2019   Lumbar stenosis 11/05/2019   S/P left knee arthroscopy 06/27/2019 07/01/2019   Derangement of posterior horn of medial meniscus of left knee    Primary osteoarthritis of left knee    S/P right knee arthroscopy 05/09/19 05/29/2019  Depression 07/24/2014   LOWER LEG, ARTHRITIS, DEGEN./OSTEO 02/18/2009   DERANGEMENT MENISCUS 02/18/2009   ANSERINE BURSITIS, RIGHT 02/18/2009    PCP: Adaline Sill, NP  REFERRING PROVIDER: Carole Civil, MD  REFERRING DIAG: Primary osteoarthritis of left knee  THERAPY DIAG:  Stiffness of left knee, not elsewhere classified  Acute pain of left knee  Rationale for Evaluation and Treatment Rehabilitation  ONSET DATE:  03/15/22  SUBJECTIVE:   SUBJECTIVE STATEMENT: Patient reports that her knee was bruised and she could hardly move her leg the morning after her last appointment. She also requested to avoid TENS at future appointments as she felt like this bothered her knee.   PERTINENT HISTORY: HTN, asthma, DM, OA, depression, anxiety, bipolar, chronic pain  PAIN:  Are you having pain? Yes: NPRS scale: 7/10 Pain location: left knee Pain description: constant throbbing and squeezing Aggravating factors: compression stocking, walking Relieving factors: ice,medication   PRECAUTIONS: None  WEIGHT BEARING RESTRICTIONS No  FALLS:  Has patient fallen in last 6 months? No  LIVING ENVIRONMENT: Lives with: lives with their family Lives in: House/apartment Stairs: Yes: External: 3 steps; on left going up; with step to pattern Has following equipment at home: Walker - 2 wheeled  OCCUPATION: disabled  PLOF: [place] reduced pain, improved mobility   OBJECTIVE: all objective assessments were performed at her initial evaluation on 03/29/22 unless otherwise noted   PATIENT SURVEYS:  FOTO 29.65  COGNITION:  Overall cognitive status: Within functional limits for tasks assessed     SENSATION: Patient reports rare numbness and tingling in left knee   EDEMA:  Moderate LLE edema   POSTURE: rounded shoulders and forward head  PALPATION: TTP: left quadriceps, hamstring, IT band, and hip adductors  LOWER EXTREMITY ROM:  Active ROM Right eval Left AROM/ PROM eval  Hip flexion    Hip extension    Hip abduction    Hip adduction    Hip internal rotation    Hip external rotation    Knee flexion 127 108/114  Knee extension 0 14  Ankle dorsiflexion    Ankle plantarflexion    Ankle inversion    Ankle eversion     (Blank rows = not tested)  GAIT: Assistive device utilized: Environmental consultant - 2 wheeled Level of assistance: Modified independence Comments: reduced gait speed, stride  length, absent heel strike or toe off on the left   TODAY'S TREATMENT:                                   9/26 EXERCISE LOG  Exercise Repetitions and Resistance Comments  Nustep L3 x 15 minutes; seat 10-9   LAQ  30 reps   Rocker board  4 minutes    Lunges onto step 6" step; 2 minutes L foot on step  Seated HS stretch  4 x 30 seconds LLE  Seated marching  30 reps each Alternating LE  Heel slides 2 minutes With knee glide       Blank cell = exercise not performed today  Modalities  Date:  Vaso: Knee, 34 degrees; low pressure, 10 mins, Pain and Edema                                   9/22 EXERCISE LOG  Exercise Repetitions and Resistance Comments  Nustep Lvl 3 x 15 mins  Quad set  20 reps w/ 5 second hold    Gastroc stretch 5 x 30 seconds   Heel Slides 20 reps RLE   SLR  15 reps   LAQ 20 reps RLE Cues to avoid leaning  Seated Marching 20 reps RLE    Blank cell = exercise not performed today   Manual Therapy Soft Tissue Mobilization: left knee, STW/M to medial and lateral left knee to decrease pain and tone.  Pt positioned in supine with RLE elevated on wedge  Modalities  Date:  Unattended Estim: Knee, IFC 80-150 Hz, 15 mins, Pain and Edema Vaso: Knee, 34 degrees, 15 mins, Pain and Edema    PATIENT EDUCATION:  Education details: avoid sleeping with a pillow behind her knee, POC, HEP, prognosis  Person educated: Patient Education method: Explanation Education comprehension: verbalized understanding   HOME EXERCISE PROGRAM: Reviewed home health HEP   ASSESSMENT:  CLINICAL IMPRESSION: Patient was progressed with multiple new and familiar interventions for improved knee mobility. She required minimal cueing with today's new interventions for proper pacing to facilitate improved soft tissue extensibility and reduced soreness. She reported no increase in pain or discomfort with any of today's interventions. She reported that her knee felt good upon the conclusion of  treatment. She continues to require skilled physical therapy to address her remaining impairments to return to her prior level of function.    OBJECTIVE IMPAIRMENTS Abnormal gait, decreased activity tolerance, decreased balance, decreased mobility, difficulty walking, decreased ROM, decreased strength, hypomobility, increased edema, impaired flexibility, and pain.   ACTIVITY LIMITATIONS carrying, lifting, standing, squatting, stairs, transfers, bed mobility, dressing, and locomotion level  PARTICIPATION LIMITATIONS: meal prep, cleaning, laundry, and shopping  PERSONAL FACTORS Transportation and 3+ comorbidities: HTN, asthma, DM, OA, depression, anxiety, bipolar, chronic pain  are also affecting patient's functional outcome.   REHAB POTENTIAL: Good  CLINICAL DECISION MAKING: Stable/uncomplicated  EVALUATION COMPLEXITY: Low   GOALS: Goals reviewed with patient? No  SHORT TERM GOALS: Target date: 04/19/2022  Patient will be independent with her initial HEP.  Baseline: Goal status: INITIAL  2.  Patient will improve her left knee extension within 10 degrees of neutral.  Baseline:  Goal status: INITIAL  LONG TERM GOALS: Target date: 05/10/2022   Patient will be independent with her advanced HEP.  Baseline:  Goal status: INITIAL  2.  Patient will improve her active left knee flexion to at least 120 degrees for improved function navigating stairs.  Baseline:  Goal status: INITIAL  3.  Patient will be able to demonstrate active left knee extension within 5 degrees of neutral for improved gait mechanics.  Baseline:  Goal status: INITIAL  4.  Patient will be able to will be able to safely ambulate at least 80 feet with a cane or the least restrictive assistive device.  Baseline:  Goal status: INITIAL  PLAN: PT FREQUENCY: 2x/week  PT DURATION: 6 weeks  PLANNED INTERVENTIONS: Therapeutic exercises, Therapeutic activity, Neuromuscular re-education, Balance training, Gait  training, Patient/Family education, Self Care, Joint mobilization, Stair training, Electrical stimulation, Cryotherapy, Moist heat, Vasopneumatic device, Manual therapy, and Re-evaluation  PLAN FOR NEXT SESSION: nustep or recumbent bike, quadriceps strengthening, and modalities as needed   Darlin Coco, PT 04/05/2022, 9:05 AM

## 2022-04-07 ENCOUNTER — Ambulatory Visit: Payer: Medicare Other

## 2022-04-07 DIAGNOSIS — M25662 Stiffness of left knee, not elsewhere classified: Secondary | ICD-10-CM | POA: Diagnosis not present

## 2022-04-07 DIAGNOSIS — M25562 Pain in left knee: Secondary | ICD-10-CM

## 2022-04-07 NOTE — Therapy (Signed)
OUTPATIENT PHYSICAL THERAPY LOWER EXTREMITY TREATMENT   Patient Name: [name] MRN: [id number] DOB:[birth year], 61 y.o., female Today's Date: 04/07/2022   PT End of Session - 04/07/22 0944     Visit Number 4    Number of Visits 12    Date for PT Re-Evaluation 05/13/22    PT Start Time 0945    PT Stop Time 1030    PT Time Calculation (min) 45 min    Activity Tolerance Patient tolerated treatment well    Behavior During Therapy [facility] for tasks assessed/performed               Past Medical History:  Diagnosis Date   Anxiety    Arthritis    Asthma    Cancer ([place])    cervical cancer; pt states a long long time ago   Depression    Diabetes mellitus without complication ([place])    Essential (primary) hypertension 06/24/2020   Hyperlipemia    Past Surgical History:  Procedure Laterality Date   ABDOMINAL HYSTERECTOMY     ANTERIOR LAT LUMBAR FUSION N/A 11/05/2019   Procedure: Lumbar Four-Five Anterolateral lumbar interbody fusion with lateral plate;  Surgeon: Kristeen Miss, MD;  Location: [place];  Service: Neurosurgery;  Laterality: N/A;  anterolateral   BACK SURGERY     pins   BACK SURGERY  07/11/2008   BIOPSY  06/29/2021   Procedure: BIOPSY;  Surgeon: Harvel Quale, MD;  [facility];  Service: Gastroenterology;;   BLADDER REPAIR     CARPAL TUNNEL RELEASE Right    [place]   COLONOSCOPY N/A 06/12/2013   rehman: normal   ESOPHAGOGASTRODUODENOSCOPY (EGD) WITH PROPOFOL N/A 06/29/2021   Procedure: ESOPHAGOGASTRODUODENOSCOPY (EGD) WITH PROPOFOL;  Surgeon: Harvel Quale, MD;  [facility];  Service: Gastroenterology;  Laterality: N/A;  8:40   KNEE ARTHROSCOPY WITH MEDIAL MENISECTOMY Right 05/09/2019   Procedure: KNEE ARTHROSCOPY WITH MEDIAL MENISECTOMY AND LATERAL MENISECTOMY;  Surgeon: Carole Civil, MD;  [facility];  Service: Orthopedics;  Laterality: Right;   KNEE ARTHROSCOPY WITH MEDIAL  MENISECTOMY Left 06/27/2019   Procedure: KNEE ARTHROSCOPY WITH MEDIAL MENISCECTOMY;  Surgeon: Carole Civil, MD;  [facility];  Service: Orthopedics;  Laterality: Left;   TOTAL KNEE ARTHROPLASTY Left 03/15/2022   Procedure: TOTAL KNEE ARTHROPLASTY;  Surgeon: Carole Civil, MD;  [facility];  Service: Orthopedics;  Laterality: Left;   Patient Active Problem List   Diagnosis Date Noted   Anxiety 03/22/2022   Asthma 03/22/2022   Bipolar 1 disorder ([place]) 03/22/2022   Chronic pain 03/22/2022   Edema 03/22/2022   Gastro-esophageal reflux 03/22/2022   Hyperlipidemia 03/22/2022   Insomnia 03/22/2022   Menopausal symptom 03/22/2022   Sinusitis 03/22/2022   Type II diabetes mellitus ([place]) 03/22/2022   Urinary incontinence 03/22/2022   Osteoarthritis of left knee 03/15/2022   History of operative procedure on lumbosacral spinal structure 02/15/2022   Urge incontinence 09/02/2021   Cervical radiculopathy 12/30/2020   Pseudoarthrosis of lumbar spine 08/25/2020   Essential (primary) hypertension 06/24/2020   Body mass index (BMI) 45.0-49.9, adult ([place]) 05/06/2020   Carpal tunnel syndrome 04/01/2020   Spondylolisthesis, lumbar region 11/22/2019   Localized swelling of both lower legs 11/12/2019   Lumbar stenosis 11/05/2019   S/P left knee arthroscopy 06/27/2019 07/01/2019   Derangement of posterior horn of medial meniscus of left knee    Primary osteoarthritis of left knee    S/P right knee arthroscopy 05/09/19  05/29/2019   Depression 07/24/2014   LOWER LEG, ARTHRITIS, DEGEN./OSTEO 02/18/2009   DERANGEMENT MENISCUS 02/18/2009   ANSERINE BURSITIS, RIGHT 02/18/2009    PCP: Adaline Sill, NP  REFERRING PROVIDER: Carole Civil, MD  REFERRING DIAG: Primary osteoarthritis of left knee  THERAPY DIAG:  Stiffness of left knee, not elsewhere classified  Acute pain of left knee  Rationale for Evaluation and Treatment Rehabilitation  ONSET DATE:  03/15/22  SUBJECTIVE:   SUBJECTIVE STATEMENT: Patient reports that her knee is sore today, but she notes that she has been doing a lot this morning.   PERTINENT HISTORY: HTN, asthma, DM, OA, depression, anxiety, bipolar, chronic pain  PAIN:  Are you having pain? Yes: NPRS scale: 7/10 Pain location: left knee Pain description: constant throbbing and squeezing Aggravating factors: compression stocking, walking Relieving factors: ice,medication   PRECAUTIONS: None  WEIGHT BEARING RESTRICTIONS No  FALLS:  Has patient fallen in last 6 months? No  LIVING ENVIRONMENT: Lives with: lives with their family Lives in: House/apartment Stairs: Yes: External: 3 steps; on left going up; with step to pattern Has following equipment at home: Walker - 2 wheeled  OCCUPATION: disabled  PLOF: [place] reduced pain, improved mobility   OBJECTIVE: all objective assessments were performed at her initial evaluation on 03/29/22 unless otherwise noted   PATIENT SURVEYS:  FOTO 29.65  COGNITION:  Overall cognitive status: Within functional limits for tasks assessed     SENSATION: Patient reports rare numbness and tingling in left knee   EDEMA:  Moderate LLE edema   POSTURE: rounded shoulders and forward head  PALPATION: TTP: left quadriceps, hamstring, IT band, and hip adductors  LOWER EXTREMITY ROM:  Active ROM Right eval Left AROM/ PROM eval  Hip flexion    Hip extension    Hip abduction    Hip adduction    Hip internal rotation    Hip external rotation    Knee flexion 127 108/114  Knee extension 0 14  Ankle dorsiflexion    Ankle plantarflexion    Ankle inversion    Ankle eversion     (Blank rows = not tested)  GAIT: Assistive device utilized: Environmental consultant - 2 wheeled Level of assistance: Modified independence Comments: reduced gait speed, stride length, absent heel strike or toe off on the left   TODAY'S TREATMENT:                                   9/28  EXERCISE LOG  Exercise Repetitions and Resistance Comments  Nustep L4 x 17 minutes; seat 7-6   LAQ 2 lbs x 30 reps   Seated HS curl Green t-band x 30 reps   Recumbent bike  L1 x 5 minutes; seat 7   Squatting 20 reps   Rocker board 3 minutes    SLR  2 x 10 reps LLE only  Bridges 20 reps   Heel slides  15 reps    Blank cell = exercise not performed today                                    9/26 EXERCISE LOG  Exercise Repetitions and Resistance Comments  Nustep L3 x 15 minutes; seat 10-9   LAQ  30 reps   Rocker board  4 minutes    Lunges onto step 6" step; 2 minutes  L foot on step  Seated HS stretch  4 x 30 seconds LLE  Seated marching  30 reps each Alternating LE  Heel slides 2 minutes With knee glide       Blank cell = exercise not performed today  Modalities  Date:  Vaso: Knee, 34 degrees; low pressure, 10 mins, Pain and Edema                                   9/22 EXERCISE LOG  Exercise Repetitions and Resistance Comments  Nustep Lvl 3 x 15 mins   Quad set  20 reps w/ 5 second hold    Gastroc stretch 5 x 30 seconds   Heel Slides 20 reps RLE   SLR  15 reps   LAQ 20 reps RLE Cues to avoid leaning  Seated Marching 20 reps RLE    Blank cell = exercise not performed today   Manual Therapy Soft Tissue Mobilization: left knee, STW/M to medial and lateral left knee to decrease pain and tone.  Pt positioned in supine with RLE elevated on wedge  Modalities  Date:  Unattended Estim: Knee, IFC 80-150 Hz, 15 mins, Pain and Edema Vaso: Knee, 34 degrees, 15 mins, Pain and Edema    PATIENT EDUCATION:  Education details: avoid sleeping with a pillow behind her knee, POC, HEP, prognosis  Person educated: Patient Education method: Explanation Education comprehension: verbalized understanding   HOME EXERCISE PROGRAM: Reviewed home health HEP   ASSESSMENT:  CLINICAL IMPRESSION: Patient was progressed with multiple new interventions for improved lower extremity strength  and mobility. She required minimal cueing with straight leg raises for quadriceps engagement to facilitate improved knee extension. She reported a mild increase in fatigue and soreness with today's interventions, but this did not limit her ability to complete any of today's interventions. She reported feeling tired and a little sore upon the conclusion fo treatment. She continues to require skilled physical therapy to address her remaining impairments to return to her prior level of function.     OBJECTIVE IMPAIRMENTS Abnormal gait, decreased activity tolerance, decreased balance, decreased mobility, difficulty walking, decreased ROM, decreased strength, hypomobility, increased edema, impaired flexibility, and pain.   ACTIVITY LIMITATIONS carrying, lifting, standing, squatting, stairs, transfers, bed mobility, dressing, and locomotion level  PARTICIPATION LIMITATIONS: meal prep, cleaning, laundry, and shopping  PERSONAL FACTORS Transportation and 3+ comorbidities: HTN, asthma, DM, OA, depression, anxiety, bipolar, chronic pain  are also affecting patient's functional outcome.   REHAB POTENTIAL: Good  CLINICAL DECISION MAKING: Stable/uncomplicated  EVALUATION COMPLEXITY: Low   GOALS: Goals reviewed with patient? No  SHORT TERM GOALS: Target date: 04/19/2022  Patient will be independent with her initial HEP.  Baseline: Goal status: INITIAL  2.  Patient will improve her left knee extension within 10 degrees of neutral.  Baseline:  Goal status: INITIAL  LONG TERM GOALS: Target date: 05/10/2022   Patient will be independent with her advanced HEP.  Baseline:  Goal status: INITIAL  2.  Patient will improve her active left knee flexion to at least 120 degrees for improved function navigating stairs.  Baseline:  Goal status: INITIAL  3.  Patient will be able to demonstrate active left knee extension within 5 degrees of neutral for improved gait mechanics.  Baseline:  Goal status:  INITIAL  4.  Patient will be able to will be able to safely ambulate at least 80 feet with a cane or  the least restrictive assistive device.  Baseline:  Goal status: INITIAL  PLAN: PT FREQUENCY: 2x/week  PT DURATION: 6 weeks  PLANNED INTERVENTIONS: Therapeutic exercises, Therapeutic activity, Neuromuscular re-education, Balance training, Gait training, Patient/Family education, Self Care, Joint mobilization, Stair training, Electrical stimulation, Cryotherapy, Moist heat, Vasopneumatic device, Manual therapy, and Re-evaluation  PLAN FOR NEXT SESSION: nustep or recumbent bike, quadriceps strengthening, and modalities as needed   Darlin Coco, PT 04/07/2022, 10:47 AM

## 2022-04-12 ENCOUNTER — Ambulatory Visit: Payer: Medicare Other | Attending: Orthopedic Surgery

## 2022-04-12 DIAGNOSIS — M25662 Stiffness of left knee, not elsewhere classified: Secondary | ICD-10-CM | POA: Insufficient documentation

## 2022-04-12 DIAGNOSIS — M25562 Pain in left knee: Secondary | ICD-10-CM | POA: Diagnosis present

## 2022-04-12 NOTE — Therapy (Signed)
OUTPATIENT PHYSICAL THERAPY LOWER EXTREMITY TREATMENT   Patient Name: [name] MRN: [id number] DOB:[birth year], 61 y.o., female Today's Date: 04/12/2022   PT End of Session - 04/12/22 0835     Visit Number 5    Number of Visits 12    Date for PT Re-Evaluation 05/13/22    PT Start Time 0815    PT Stop Time 0910    PT Time Calculation (min) 55 min    Activity Tolerance Patient tolerated treatment well    Behavior During Therapy [facility] for tasks assessed/performed                Past Medical History:  Diagnosis Date   Anxiety    Arthritis    Asthma    Cancer ([place])    cervical cancer; pt states a long long time ago   Depression    Diabetes mellitus without complication ([place])    Essential (primary) hypertension 06/24/2020   Hyperlipemia    Past Surgical History:  Procedure Laterality Date   ABDOMINAL HYSTERECTOMY     ANTERIOR LAT LUMBAR FUSION N/A 11/05/2019   Procedure: Lumbar Four-Five Anterolateral lumbar interbody fusion with lateral plate;  Surgeon: Kristeen Miss, MD;  Location: [place];  Service: Neurosurgery;  Laterality: N/A;  anterolateral   BACK SURGERY     pins   BACK SURGERY  07/11/2008   BIOPSY  06/29/2021   Procedure: BIOPSY;  Surgeon: Harvel Quale, MD;  [facility];  Service: Gastroenterology;;   BLADDER REPAIR     CARPAL TUNNEL RELEASE Right    [place]   COLONOSCOPY N/A 06/12/2013   rehman: normal   ESOPHAGOGASTRODUODENOSCOPY (EGD) WITH PROPOFOL N/A 06/29/2021   Procedure: ESOPHAGOGASTRODUODENOSCOPY (EGD) WITH PROPOFOL;  Surgeon: Harvel Quale, MD;  [facility];  Service: Gastroenterology;  Laterality: N/A;  8:40   KNEE ARTHROSCOPY WITH MEDIAL MENISECTOMY Right 05/09/2019   Procedure: KNEE ARTHROSCOPY WITH MEDIAL MENISECTOMY AND LATERAL MENISECTOMY;  Surgeon: Carole Civil, MD;  [facility];  Service: Orthopedics;  Laterality: Right;   KNEE ARTHROSCOPY WITH MEDIAL  MENISECTOMY Left 06/27/2019   Procedure: KNEE ARTHROSCOPY WITH MEDIAL MENISCECTOMY;  Surgeon: Carole Civil, MD;  [facility];  Service: Orthopedics;  Laterality: Left;   TOTAL KNEE ARTHROPLASTY Left 03/15/2022   Procedure: TOTAL KNEE ARTHROPLASTY;  Surgeon: Carole Civil, MD;  [facility];  Service: Orthopedics;  Laterality: Left;   Patient Active Problem List   Diagnosis Date Noted   Anxiety 03/22/2022   Asthma 03/22/2022   Bipolar 1 disorder ([place]) 03/22/2022   Chronic pain 03/22/2022   Edema 03/22/2022   Gastro-esophageal reflux 03/22/2022   Hyperlipidemia 03/22/2022   Insomnia 03/22/2022   Menopausal symptom 03/22/2022   Sinusitis 03/22/2022   Type II diabetes mellitus ([place]) 03/22/2022   Urinary incontinence 03/22/2022   Osteoarthritis of left knee 03/15/2022   History of operative procedure on lumbosacral spinal structure 02/15/2022   Urge incontinence 09/02/2021   Cervical radiculopathy 12/30/2020   Pseudoarthrosis of lumbar spine 08/25/2020   Essential (primary) hypertension 06/24/2020   Body mass index (BMI) 45.0-49.9, adult ([place]) 05/06/2020   Carpal tunnel syndrome 04/01/2020   Spondylolisthesis, lumbar region 11/22/2019   Localized swelling of both lower legs 11/12/2019   Lumbar stenosis 11/05/2019   S/P left knee arthroscopy 06/27/2019 07/01/2019   Derangement of posterior horn of medial meniscus of left knee    Primary osteoarthritis of left knee    S/P right knee arthroscopy  05/09/19 05/29/2019   Depression 07/24/2014   LOWER LEG, ARTHRITIS, DEGEN./OSTEO 02/18/2009   DERANGEMENT MENISCUS 02/18/2009   ANSERINE BURSITIS, RIGHT 02/18/2009    PCP: Adaline Sill, NP  REFERRING PROVIDER: Carole Civil, MD  REFERRING DIAG: Primary osteoarthritis of left knee  THERAPY DIAG:  Stiffness of left knee, not elsewhere classified  Acute pain of left knee  Rationale for Evaluation and Treatment Rehabilitation  ONSET DATE:  03/15/22  SUBJECTIVE:   SUBJECTIVE STATEMENT: Patient reports that her knee is a little sore and tight today. She notes that she had a scab on her incision which fell off and she thinks that it may be getting infected. She is planning to see her referring physician on 10/5. She feels that she is doing a lot better since she had surgery and started therapy. However, she still has to stop and use ice throughout the day due to increased soreness and pain with increased activity levels.    PERTINENT HISTORY: HTN, asthma, DM, OA, depression, anxiety, bipolar, chronic pain  PAIN:  Are you having pain? Yes: NPRS scale: 6/10 Pain location: left knee Pain description: constant throbbing and squeezing Aggravating factors: compression stocking, walking Relieving factors: ice,medication   PRECAUTIONS: None  WEIGHT BEARING RESTRICTIONS No  FALLS:  Has patient fallen in last 6 months? No  LIVING ENVIRONMENT: Lives with: lives with their family Lives in: House/apartment Stairs: Yes: External: 3 steps; on left going up; with step to pattern Has following equipment at home: Walker - 2 wheeled  OCCUPATION: disabled  PLOF: [place] reduced pain, improved mobility   OBJECTIVE: all objective assessments were performed at her initial evaluation on 03/29/22 unless otherwise noted   PATIENT SURVEYS:  FOTO 49.71 on 04/12/22  COGNITION:  Overall cognitive status: Within functional limits for tasks assessed     SENSATION: Patient reports rare numbness and tingling in left knee   EDEMA:  Moderate LLE edema   POSTURE: rounded shoulders and forward head  PALPATION: TTP: left quadriceps, hamstring, IT band, and hip adductors  LOWER EXTREMITY ROM:  Active ROM Right eval Left AROM/ PROM eval Left AROM 04/12/22  Hip flexion     Hip extension     Hip abduction     Hip adduction     Hip internal rotation     Hip external rotation     Knee flexion 127 108/114 117  Knee  extension 0 14 7  Ankle dorsiflexion     Ankle plantarflexion     Ankle inversion     Ankle eversion      (Blank rows = not tested)  GAIT: Assistive device utilized: Environmental consultant - 2 wheeled Level of assistance: Modified independence Comments: reduced gait speed, stride length, absent heel strike or toe off on the left   TODAY'S TREATMENT:                                   10/3 EXERCISE LOG  Exercise Repetitions and Resistance Comments  Nustep L4 x 16 minutes; seat 8-7   Step up  6" step x 20 reps each   LAQ 3 lbs x 3 minutes   Lunge onto step 6" step x 3 minutes   Standing HS curls 2 minutes  LLE only  Recumbent bike L2 x 9 minutes; seat 7    Blank cell = exercise not performed today  Modalities  Date:  Vaso: Knee, 34  degrees; low pressure, 10 mins, Pain                                   9/28 EXERCISE LOG  Exercise Repetitions and Resistance Comments  Nustep L4 x 17 minutes; seat 7-6   LAQ 2 lbs x 30 reps   Seated HS curl Green t-band x 30 reps   Recumbent bike  L1 x 5 minutes; seat 7   Squatting 20 reps   Rocker board 3 minutes    SLR  2 x 10 reps LLE only  Bridges 20 reps   Heel slides  15 reps    Blank cell = exercise not performed today                                    9/26 EXERCISE LOG  Exercise Repetitions and Resistance Comments  Nustep L3 x 15 minutes; seat 10-9   LAQ  30 reps   Rocker board  4 minutes    Lunges onto step 6" step; 2 minutes L foot on step  Seated HS stretch  4 x 30 seconds LLE  Seated marching  30 reps each Alternating LE  Heel slides 2 minutes With knee glide       Blank cell = exercise not performed today  Modalities  Date:  Vaso: Knee, 34 degrees; low pressure, 10 mins, Pain and Edema  PATIENT EDUCATION:  Education details: progress with therapy, healing, goals for therapy Person educated: Patient Education method: Explanation Education comprehension: verbalized understanding   HOME EXERCISE PROGRAM: Reviewed home health HEP    ASSESSMENT:  CLINICAL IMPRESSION: Patient is making good progress with skilled physical therapy as evidenced by her subjective reports, objective measures, and progress toward her goals. She has nearly met all of her of her left knee AROM goals at this time. She was progressed with step ups and other familiar interventions for improved lower extremity strength and functional mobility. She required minimal cueing with standing hamstring curls for upright posture to prevent trunk flexion to isolate hamstring engagement. She reported feeling good upon the conclusion of treatment. Recommend that she continues skilled physical therapy to address her remaining impairments to return to her prior level of function.    OBJECTIVE IMPAIRMENTS Abnormal gait, decreased activity tolerance, decreased balance, decreased mobility, difficulty walking, decreased ROM, decreased strength, hypomobility, increased edema, impaired flexibility, and pain.   ACTIVITY LIMITATIONS carrying, lifting, standing, squatting, stairs, transfers, bed mobility, dressing, and locomotion level  PARTICIPATION LIMITATIONS: meal prep, cleaning, laundry, and shopping  PERSONAL FACTORS Transportation and 3+ comorbidities: HTN, asthma, DM, OA, depression, anxiety, bipolar, chronic pain  are also affecting patient's functional outcome.   REHAB POTENTIAL: Good  CLINICAL DECISION MAKING: Stable/uncomplicated  EVALUATION COMPLEXITY: Low   GOALS: Goals reviewed with patient? No  SHORT TERM GOALS: Target date: 04/19/2022  Patient will be independent with her initial HEP.  Baseline: Goal status: MET  2.  Patient will improve her left knee extension within 10 degrees of neutral.  Baseline:  Goal status: MET  LONG TERM GOALS: Target date: 05/10/2022   Patient will be independent with her advanced HEP.  Baseline:  Goal status: IN PROGRESS  2.  Patient will improve her active left knee flexion to at least 120 degrees for improved  function navigating stairs.  Baseline: 117 degrees Goal status: IN PROGRESS  3.  Patient will be able to demonstrate active left knee extension within 5 degrees of neutral for improved gait mechanics.  Baseline: 7 degrees from neutral Goal status: IN PROGRESS  4.  Patient will be able to will be able to safely ambulate at least 80 feet with a cane or the least restrictive assistive device.  Baseline: using SPC for ambulation Goal status: MET  PLAN: PT FREQUENCY: 2x/week  PT DURATION: 6 weeks  PLANNED INTERVENTIONS: Therapeutic exercises, Therapeutic activity, Neuromuscular re-education, Balance training, Gait training, Patient/Family education, Self Care, Joint mobilization, Stair training, Electrical stimulation, Cryotherapy, Moist heat, Vasopneumatic device, Manual therapy, and Re-evaluation  PLAN FOR NEXT SESSION: nustep or recumbent bike, quadriceps strengthening, and modalities as needed   Darlin Coco, PT 04/12/2022, 9:13 AM

## 2022-04-14 ENCOUNTER — Encounter: Payer: Self-pay | Admitting: Orthopedic Surgery

## 2022-04-14 ENCOUNTER — Ambulatory Visit (INDEPENDENT_AMBULATORY_CARE_PROVIDER_SITE_OTHER): Payer: Medicare Other | Admitting: Orthopedic Surgery

## 2022-04-14 DIAGNOSIS — Z96652 Presence of left artificial knee joint: Secondary | ICD-10-CM

## 2022-04-14 DIAGNOSIS — T8131XA Disruption of external operation (surgical) wound, not elsewhere classified, initial encounter: Secondary | ICD-10-CM

## 2022-04-14 MED ORDER — SULFAMETHOXAZOLE-TRIMETHOPRIM 800-160 MG PO TABS: 1.0000 | ORAL_TABLET | Freq: Two times a day (BID) | ORAL | 0 refills | Status: DC

## 2022-04-14 NOTE — Patient Instructions (Signed)
Wash the area with soap and warm water   Dry   Apply neosporin  Cover   Start antibiotic

## 2022-04-14 NOTE — Progress Notes (Signed)
Chief Complaint  Patient presents with   Routine Post Op    S/P LT TKA DOS 03/15/22 Has a small open area above the knee   Encounter Diagnoses  Name Primary?   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  However, a scab came off of the incision and now she has some purulent drainage.  There is no surrounding erythema she has full passive range of motion of the knee without pain or range of motion of 0-1 20 her knee pain has decreased significantly  This appears to be a superficial stitch abscess  Recommend washing with soap and water, Neosporin keep it covered come back in 2 weeks after taking Bactrim  Meds ordered this encounter  Medications   sulfamethoxazole-trimethoprim (BACTRIM DS) 800-160 MG tablet    Sig: Take 1 tablet by mouth 2 (two) times daily.    Dispense:  28 tablet    Refill:  0

## 2022-04-15 ENCOUNTER — Ambulatory Visit: Payer: Medicare Other | Admitting: *Deleted

## 2022-04-15 ENCOUNTER — Encounter: Payer: Self-pay | Admitting: *Deleted

## 2022-04-15 DIAGNOSIS — M25662 Stiffness of left knee, not elsewhere classified: Secondary | ICD-10-CM

## 2022-04-15 DIAGNOSIS — M25562 Pain in left knee: Secondary | ICD-10-CM

## 2022-04-15 NOTE — Therapy (Signed)
OUTPATIENT PHYSICAL THERAPY LOWER EXTREMITY TREATMENT   Patient Name: [name] MRN: [id number] DOB:Aug 20, 1960, 61 y.o., female Today's Date: 04/15/2022   PT End of Session - 04/15/22 0806     Visit Number 6    Number of Visits 12    Date for PT Re-Evaluation 05/13/22    PT Start Time 0815    PT Stop Time 0908    PT Time Calculation (min) 53 min                Past Medical History:  Diagnosis Date   Anxiety    Arthritis    Asthma    Cancer ([place])    cervical cancer; pt states a long long time ago   Depression    Diabetes mellitus without complication ([place])    Essential (primary) hypertension 06/24/2020   Hyperlipemia    Past Surgical History:  Procedure Laterality Date   ABDOMINAL HYSTERECTOMY     ANTERIOR LAT LUMBAR FUSION N/A 11/05/2019   Procedure: Lumbar Four-Five Anterolateral lumbar interbody fusion with lateral plate;  Surgeon: Kristeen Miss, MD;  Location: [place];  Service: Neurosurgery;  Laterality: N/A;  anterolateral   BACK SURGERY     pins   BACK SURGERY  07/11/2008   BIOPSY  06/29/2021   Procedure: BIOPSY;  Surgeon: Harvel Quale, MD;  [facility];  Service: Gastroenterology;;   BLADDER REPAIR     CARPAL TUNNEL RELEASE Right    [place]   COLONOSCOPY N/A 06/12/2013   rehman: normal   ESOPHAGOGASTRODUODENOSCOPY (EGD) WITH PROPOFOL N/A 06/29/2021   Procedure: ESOPHAGOGASTRODUODENOSCOPY (EGD) WITH PROPOFOL;  Surgeon: Harvel Quale, MD;  [facility];  Service: Gastroenterology;  Laterality: N/A;  8:40   KNEE ARTHROSCOPY WITH MEDIAL MENISECTOMY Right 05/09/2019   Procedure: KNEE ARTHROSCOPY WITH MEDIAL MENISECTOMY AND LATERAL MENISECTOMY;  Surgeon: Carole Civil, MD;  [facility];  Service: Orthopedics;  Laterality: Right;   KNEE ARTHROSCOPY WITH MEDIAL MENISECTOMY Left 06/27/2019   Procedure: KNEE ARTHROSCOPY WITH MEDIAL MENISCECTOMY;  Surgeon: Carole Civil,  MD;  [facility];  Service: Orthopedics;  Laterality: Left;   TOTAL KNEE ARTHROPLASTY Left 03/15/2022   Procedure: TOTAL KNEE ARTHROPLASTY;  Surgeon: Carole Civil, MD;  [facility];  Service: Orthopedics;  Laterality: Left;   Patient Active Problem List   Diagnosis Date Noted   Anxiety 03/22/2022   Asthma 03/22/2022   Bipolar 1 disorder ([place]) 03/22/2022   Chronic pain 03/22/2022   Edema 03/22/2022   Gastro-esophageal reflux 03/22/2022   Hyperlipidemia 03/22/2022   Insomnia 03/22/2022   Menopausal symptom 03/22/2022   Sinusitis 03/22/2022   Type II diabetes mellitus ([place]) 03/22/2022   Urinary incontinence 03/22/2022   Osteoarthritis of left knee 03/15/2022   History of operative procedure on lumbosacral spinal structure 02/15/2022   Urge incontinence 09/02/2021   Cervical radiculopathy 12/30/2020   Pseudoarthrosis of lumbar spine 08/25/2020   Essential (primary) hypertension 06/24/2020   Body mass index (BMI) 45.0-49.9, adult ([place]) 05/06/2020   Carpal tunnel syndrome 04/01/2020   Spondylolisthesis, lumbar region 11/22/2019   Localized swelling of both lower legs 11/12/2019   Lumbar stenosis 11/05/2019   S/P left knee arthroscopy 06/27/2019 07/01/2019   Derangement of posterior horn of medial meniscus of left knee    Primary osteoarthritis of left knee    S/P right knee arthroscopy 05/09/19 05/29/2019   Depression 07/24/2014   LOWER LEG, ARTHRITIS, DEGEN./OSTEO 02/18/2009   DERANGEMENT MENISCUS 02/18/2009  [place], RIGHT 02/18/2009    PCP: Adaline Sill, NP  REFERRING PROVIDER: Carole Civil, MD  REFERRING DIAG: Primary osteoarthritis of left knee  THERAPY DIAG:  Stiffness of left knee, not elsewhere classified  Acute pain of left knee  Rationale for Evaluation and Treatment Rehabilitation  ONSET DATE: 03/15/22  SUBJECTIVE:   SUBJECTIVE STATEMENT: Went to MD and he was pleased with current status  PERTINENT HISTORY: HTN,  asthma, DM, OA, depression, anxiety, bipolar, chronic pain  PAIN:  Are you having pain? Yes: NPRS scale: 6/10 Pain location: left knee Pain description: constant throbbing and squeezing Aggravating factors: compression stocking, walking Relieving factors: ice,medication   PRECAUTIONS: None  WEIGHT BEARING RESTRICTIONS No  FALLS:  Has patient fallen in last 6 months? No  LIVING ENVIRONMENT: Lives with: lives with their family Lives in: House/apartment Stairs: Yes: External: 3 steps; on left going up; with step to pattern Has following equipment at home: Walker - 2 wheeled  OCCUPATION: disabled  PLOF: [place] reduced pain, improved mobility   OBJECTIVE: all objective assessments were performed at her initial evaluation on 03/29/22 unless otherwise noted   PATIENT SURVEYS:  FOTO 49.71 on 04/12/22  COGNITION:  Overall cognitive status: Within functional limits for tasks assessed     SENSATION: Patient reports rare numbness and tingling in left knee   EDEMA:  Moderate LLE edema   POSTURE: rounded shoulders and forward head  PALPATION: TTP: left quadriceps, hamstring, IT band, and hip adductors  LOWER EXTREMITY ROM:  Active ROM Right eval Left AROM/ PROM eval Left AROM 04/12/22  Hip flexion     Hip extension     Hip abduction     Hip adduction     Hip internal rotation     Hip external rotation     Knee flexion 127 108/114 117  Knee extension 0 14 7  Ankle dorsiflexion     Ankle plantarflexion     Ankle inversion     Ankle eversion      (Blank rows = not tested)  GAIT: Assistive device utilized: Environmental consultant - 2 wheeled Level of assistance: Modified independence Comments: reduced gait speed, stride length, absent heel strike or toe off on the left   TODAY'S TREATMENT:                                   10/6 EXERCISE LOG  LT knee  Exercise Repetitions and Resistance Comments  Nustep L4 x 16 minutes; seat 8-7   Step up  6" step x 20 reps  each   LAQ 4 lbs x 3 minutes   Lunge onto step 8" step x 3 minutes   Standing HS curls 2 minutes 2# LLE only  Recumbent bike L2 x 5 minutes; seat 6    Blank cell = exercise not performed today  Modalities Manual  scar mobs, and PROM for extension with Pt performing QS Date:  Vaso: Knee, 34 degrees; low pressure, 10 mins, Pain                                   9/28 EXERCISE LOG  Exercise Repetitions and Resistance Comments  Nustep L4 x 17 minutes; seat 7-6   LAQ 2 lbs x 30 reps   Seated HS curl Green t-band x 30 reps   Recumbent bike  L1 x 5 minutes; seat 7   Squatting 20 reps   Rocker board 3 minutes    SLR  2 x 10 reps LLE only  Bridges 20 reps   Heel slides  15 reps    Blank cell = exercise not performed today                                    9/26 EXERCISE LOG  Exercise Repetitions and Resistance Comments  Nustep L3 x 15 minutes; seat 10-9   LAQ  30 reps   Rocker board  4 minutes    Lunges onto step 6" step; 2 minutes L foot on step  Seated HS stretch  4 x 30 seconds LLE  Seated marching  30 reps each Alternating LE  Heel slides 2 minutes With knee glide       Blank cell = exercise not performed today  Modalities  Date:  Vaso: Knee, 34 degrees; low pressure, 10 mins, Pain and Edema  PATIENT EDUCATION:  Education details: progress with therapy, healing, goals for therapy Person educated: Patient Education method: Explanation Education comprehension: verbalized understanding   HOME EXERCISE PROGRAM: Reviewed home health HEP   ASSESSMENT:  CLINICAL IMPRESSION: Pt arrived today reporting f/u with MD went well and wants her to continue with PT. She did well with progression of exs today and did well with 4#s on LAQ's. Pt did well with scar massage and passive extension stretching. Vaso end of session.   OBJECTIVE IMPAIRMENTS Abnormal gait, decreased activity tolerance, decreased balance, decreased mobility, difficulty walking, decreased ROM, decreased strength,  hypomobility, increased edema, impaired flexibility, and pain.   ACTIVITY LIMITATIONS carrying, lifting, standing, squatting, stairs, transfers, bed mobility, dressing, and locomotion level  PARTICIPATION LIMITATIONS: meal prep, cleaning, laundry, and shopping  PERSONAL FACTORS Transportation and 3+ comorbidities: HTN, asthma, DM, OA, depression, anxiety, bipolar, chronic pain  are also affecting patient's functional outcome.   REHAB POTENTIAL: Good  CLINICAL DECISION MAKING: Stable/uncomplicated  EVALUATION COMPLEXITY: Low   GOALS: Goals reviewed with patient? No  SHORT TERM GOALS: Target date: 04/19/2022  Patient will be independent with her initial HEP.  Baseline: Goal status: MET  2.  Patient will improve her left knee extension within 10 degrees of neutral.  Baseline:  Goal status: MET  LONG TERM GOALS: Target date: 05/10/2022   Patient will be independent with her advanced HEP.  Baseline:  Goal status: IN PROGRESS  2.  Patient will improve her active left knee flexion to at least 120 degrees for improved function navigating stairs.  Baseline: 117 degrees Goal status: IN PROGRESS  3.  Patient will be able to demonstrate active left knee extension within 5 degrees of neutral for improved gait mechanics.  Baseline: 7 degrees from neutral Goal status: IN PROGRESS  4.  Patient will be able to will be able to safely ambulate at least 80 feet with a cane or the least restrictive assistive device.  Baseline: using SPC for ambulation Goal status: MET  PLAN: PT FREQUENCY: 2x/week  PT DURATION: 6 weeks  PLANNED INTERVENTIONS: Therapeutic exercises, Therapeutic activity, Neuromuscular re-education, Balance training, Gait training, Patient/Family education, Self Care, Joint mobilization, Stair training, Electrical stimulation, Cryotherapy, Moist heat, Vasopneumatic device, Manual therapy, and Re-evaluation  PLAN FOR NEXT SESSION: nustep or recumbent bike, quadriceps  strengthening, and modalities as needed   Liliah Dorian,CHRIS, PTA 04/15/2022, 9:19 AM

## 2022-04-19 ENCOUNTER — Ambulatory Visit: Payer: Medicare Other

## 2022-04-19 DIAGNOSIS — M25662 Stiffness of left knee, not elsewhere classified: Secondary | ICD-10-CM | POA: Diagnosis not present

## 2022-04-19 DIAGNOSIS — M25562 Pain in left knee: Secondary | ICD-10-CM

## 2022-04-19 NOTE — Therapy (Signed)
OUTPATIENT PHYSICAL THERAPY LOWER EXTREMITY TREATMENT   Patient Name: CELSA NORDAHL MRN: [id number] DOB:[birth year], 61 y.o., female Today's Date: 04/19/2022   PT End of Session - 04/19/22 0810     Visit Number 7    Number of Visits 12    Date for PT Re-Evaluation 05/13/22    PT Start Time 0815    PT Stop Time 0900    PT Time Calculation (min) 45 min    Activity Tolerance Patient tolerated treatment well    Behavior During Therapy [facility] for tasks assessed/performed                Past Medical History:  Diagnosis Date   Anxiety    Arthritis    Asthma    Cancer ([place])    cervical cancer; pt states a long long time ago   Depression    Diabetes mellitus without complication ([place])    Essential (primary) hypertension 06/24/2020   Hyperlipemia    Past Surgical History:  Procedure Laterality Date   ABDOMINAL HYSTERECTOMY     ANTERIOR LAT LUMBAR FUSION N/A 11/05/2019   Procedure: Lumbar Four-Five Anterolateral lumbar interbody fusion with lateral plate;  Surgeon: Kristeen Miss, MD;  Location: [place];  Service: Neurosurgery;  Laterality: N/A;  anterolateral   BACK SURGERY     pins   BACK SURGERY  07/11/2008   BIOPSY  06/29/2021   Procedure: BIOPSY;  Surgeon: Harvel Quale, MD;  [facility];  Service: Gastroenterology;;   BLADDER REPAIR     CARPAL TUNNEL RELEASE Right    [place]   COLONOSCOPY N/A 06/12/2013   rehman: normal   ESOPHAGOGASTRODUODENOSCOPY (EGD) WITH PROPOFOL N/A 06/29/2021   Procedure: ESOPHAGOGASTRODUODENOSCOPY (EGD) WITH PROPOFOL;  Surgeon: Harvel Quale, MD;  [facility];  Service: Gastroenterology;  Laterality: N/A;  8:40   KNEE ARTHROSCOPY WITH MEDIAL MENISECTOMY Right 05/09/2019   Procedure: KNEE ARTHROSCOPY WITH MEDIAL MENISECTOMY AND LATERAL MENISECTOMY;  Surgeon: Carole Civil, MD;  [facility];  Service: Orthopedics;  Laterality: Right;   KNEE ARTHROSCOPY WITH MEDIAL  MENISECTOMY Left 06/27/2019   Procedure: KNEE ARTHROSCOPY WITH MEDIAL MENISCECTOMY;  Surgeon: Carole Civil, MD;  [facility];  Service: Orthopedics;  Laterality: Left;   TOTAL KNEE ARTHROPLASTY Left 03/15/2022   Procedure: TOTAL KNEE ARTHROPLASTY;  Surgeon: Carole Civil, MD;  [facility];  Service: Orthopedics;  Laterality: Left;   Patient Active Problem List   Diagnosis Date Noted   Anxiety 03/22/2022   Asthma 03/22/2022   Bipolar 1 disorder ([place]) 03/22/2022   Chronic pain 03/22/2022   Edema 03/22/2022   Gastro-esophageal reflux 03/22/2022   Hyperlipidemia 03/22/2022   Insomnia 03/22/2022   Menopausal symptom 03/22/2022   Sinusitis 03/22/2022   Type II diabetes mellitus ([place]) 03/22/2022   Urinary incontinence 03/22/2022   Osteoarthritis of left knee 03/15/2022   History of operative procedure on lumbosacral spinal structure 02/15/2022   Urge incontinence 09/02/2021   Cervical radiculopathy 12/30/2020   Pseudoarthrosis of lumbar spine 08/25/2020   Essential (primary) hypertension 06/24/2020   Body mass index (BMI) 45.0-49.9, adult ([place]) 05/06/2020   Carpal tunnel syndrome 04/01/2020   Spondylolisthesis, lumbar region 11/22/2019   Localized swelling of both lower legs 11/12/2019   Lumbar stenosis 11/05/2019   S/P left knee arthroscopy 06/27/2019 07/01/2019   Derangement of posterior horn of medial meniscus of left knee    Primary osteoarthritis of left knee    S/P right knee arthroscopy  05/09/19 05/29/2019   Depression 07/24/2014   LOWER LEG, ARTHRITIS, DEGEN./OSTEO 02/18/2009   DERANGEMENT MENISCUS 02/18/2009   ANSERINE BURSITIS, RIGHT 02/18/2009    PCP: Adaline Sill, NP  REFERRING PROVIDER: Carole Civil, MD  REFERRING DIAG: Primary osteoarthritis of left knee  THERAPY DIAG:  Stiffness of left knee, not elsewhere classified  Acute pain of left knee  Rationale for Evaluation and Treatment Rehabilitation  ONSET DATE:  03/15/22  SUBJECTIVE:   SUBJECTIVE STATEMENT: Patient reports that her knee is getting better.   PERTINENT HISTORY: HTN, asthma, DM, OA, depression, anxiety, bipolar, chronic pain  PAIN:  Are you having pain? Yes: NPRS scale: 0/10 Pain location: left knee Pain description: constant throbbing and squeezing Aggravating factors: compression stocking, walking Relieving factors: ice,medication   PRECAUTIONS: None  WEIGHT BEARING RESTRICTIONS No  FALLS:  Has patient fallen in last 6 months? No  LIVING ENVIRONMENT: Lives with: lives with their family Lives in: House/apartment Stairs: Yes: External: 3 steps; on left going up; with step to pattern Has following equipment at home: Walker - 2 wheeled  OCCUPATION: disabled  PLOF: [place] reduced pain, improved mobility   OBJECTIVE: all objective assessments were performed at her initial evaluation on 03/29/22 unless otherwise noted   PATIENT SURVEYS:  FOTO 49.71 on 04/12/22  COGNITION:  Overall cognitive status: Within functional limits for tasks assessed     SENSATION: Patient reports rare numbness and tingling in left knee   EDEMA:  Moderate LLE edema   POSTURE: rounded shoulders and forward head  PALPATION: TTP: left quadriceps, hamstring, IT band, and hip adductors  LOWER EXTREMITY ROM:  Active ROM Right eval Left AROM/ PROM eval Left AROM 04/12/22  Hip flexion     Hip extension     Hip abduction     Hip adduction     Hip internal rotation     Hip external rotation     Knee flexion 127 108/114 117  Knee extension 0 14 7  Ankle dorsiflexion     Ankle plantarflexion     Ankle inversion     Ankle eversion      (Blank rows = not tested)  GAIT: Assistive device utilized: Environmental consultant - 2 wheeled Level of assistance: Modified independence Comments: reduced gait speed, stride length, absent heel strike or toe off on the left   TODAY'S TREATMENT:                                   10/10  EXERCISE LOG  Exercise Repetitions and Resistance Comments  Recumbent bike L2-3 x 12 minutes; seat 6   Gastroc stretch  4 x 30 seconds   Step down 6" step x 15 reps each    LAQ  5 lbs x 2 minutes   Standing hamstring curl 20 reps     Blank cell = exercise not performed today  Modalities  Date:  Vaso: Knee, 34 degrees; low pressure, 15 mins, Pain                                   10/6 EXERCISE LOG  LT knee  Exercise Repetitions and Resistance Comments  Nustep L4 x 16 minutes; seat 8-7   Step up  6" step x 20 reps each   LAQ 4 lbs x 3 minutes   Lunge onto step 8"  step x 3 minutes   Standing HS curls 2 minutes 2# LLE only  Recumbent bike L2 x 5 minutes; seat 6    Blank cell = exercise not performed today  Modalities Manual  scar mobs, and PROM for extension with Pt performing QS Date:  Vaso: Knee, 34 degrees; low pressure, 10 mins, Pain                                   9/28 EXERCISE LOG  Exercise Repetitions and Resistance Comments  Nustep L4 x 17 minutes; seat 7-6   LAQ 2 lbs x 30 reps   Seated HS curl Green t-band x 30 reps   Recumbent bike  L1 x 5 minutes; seat 7   Squatting 20 reps   Rocker board 3 minutes    SLR  2 x 10 reps LLE only  Bridges 20 reps   Heel slides  15 reps    Blank cell = exercise not performed today    PATIENT EDUCATION:  Education details: progress with therapy, healing, goals for therapy Person educated: Patient Education method: Explanation Education comprehension: verbalized understanding   HOME EXERCISE PROGRAM: Reviewed home health HEP   ASSESSMENT:  CLINICAL IMPRESSION: Patient was progressed with multiple familiar interventions for improved lower extremity strength and mobility with moderate difficulty and fatigue. She required minimal cueing with step downs to promote upright stance to facilitate improved safety and awareness to her surroundings. Fatigue was her primary limitation with today's interventions as evidenced by arc of  motion with activities such as long arc quads as her motion slowly decreased as she continued to perform this intervention. She reported feeling good upon the conclusion of treatment. She continues to require skilled physical therapy to address her remaining impairments to return to her prior level of function.   OBJECTIVE IMPAIRMENTS Abnormal gait, decreased activity tolerance, decreased balance, decreased mobility, difficulty walking, decreased ROM, decreased strength, hypomobility, increased edema, impaired flexibility, and pain.   ACTIVITY LIMITATIONS carrying, lifting, standing, squatting, stairs, transfers, bed mobility, dressing, and locomotion level  PARTICIPATION LIMITATIONS: meal prep, cleaning, laundry, and shopping  PERSONAL FACTORS Transportation and 3+ comorbidities: HTN, asthma, DM, OA, depression, anxiety, bipolar, chronic pain  are also affecting patient's functional outcome.   REHAB POTENTIAL: Good  CLINICAL DECISION MAKING: Stable/uncomplicated  EVALUATION COMPLEXITY: Low   GOALS: Goals reviewed with patient? No  SHORT TERM GOALS: Target date: 04/19/2022  Patient will be independent with her initial HEP.  Baseline: Goal status: MET  2.  Patient will improve her left knee extension within 10 degrees of neutral.  Baseline:  Goal status: MET  LONG TERM GOALS: Target date: 05/10/2022   Patient will be independent with her advanced HEP.  Baseline:  Goal status: IN PROGRESS  2.  Patient will improve her active left knee flexion to at least 120 degrees for improved function navigating stairs.  Baseline: 117 degrees Goal status: IN PROGRESS  3.  Patient will be able to demonstrate active left knee extension within 5 degrees of neutral for improved gait mechanics.  Baseline: 7 degrees from neutral Goal status: IN PROGRESS  4.  Patient will be able to will be able to safely ambulate at least 80 feet with a cane or the least restrictive assistive device.  Baseline:  using SPC for ambulation Goal status: MET  PLAN: PT FREQUENCY: 2x/week  PT DURATION: 6 weeks  PLANNED INTERVENTIONS: Therapeutic exercises, Therapeutic activity,  Neuromuscular re-education, Balance training, Gait training, Patient/Family education, Self Care, Joint mobilization, Stair training, Electrical stimulation, Cryotherapy, Moist heat, Vasopneumatic device, Manual therapy, and Re-evaluation  PLAN FOR NEXT SESSION: nustep or recumbent bike, quadriceps strengthening, and modalities as needed   Darlin Coco, PT 04/19/2022, 12:22 PM

## 2022-04-21 ENCOUNTER — Ambulatory Visit: Payer: Medicare Other | Admitting: Physical Therapy

## 2022-04-21 ENCOUNTER — Encounter: Payer: Self-pay | Admitting: Physical Therapy

## 2022-04-21 DIAGNOSIS — M25662 Stiffness of left knee, not elsewhere classified: Secondary | ICD-10-CM

## 2022-04-21 DIAGNOSIS — M25562 Pain in left knee: Secondary | ICD-10-CM

## 2022-04-21 NOTE — Therapy (Signed)
OUTPATIENT PHYSICAL THERAPY LOWER EXTREMITY TREATMENT   Patient Name: [name] MRN: [id number] DOB:[birth year], 61 y.o., female Today's Date: 04/21/2022   PT End of Session - 04/21/22 1001     Visit Number 9    Number of Visits 12    Date for PT Re-Evaluation 05/13/22    PT Start Time 0815    PT Stop Time 0906    PT Time Calculation (min) 51 min    Activity Tolerance Patient tolerated treatment well    Behavior During Therapy [facility] for tasks assessed/performed                Past Medical History:  Diagnosis Date   Anxiety    Arthritis    Asthma    Cancer ([place])    cervical cancer; pt states a long long time ago   Depression    Diabetes mellitus without complication ([place])    Essential (primary) hypertension 06/24/2020   Hyperlipemia    Past Surgical History:  Procedure Laterality Date   ABDOMINAL HYSTERECTOMY     ANTERIOR LAT LUMBAR FUSION N/A 11/05/2019   Procedure: Lumbar Four-Five Anterolateral lumbar interbody fusion with lateral plate;  Surgeon: Kristeen Miss, MD;  Location: [place];  Service: Neurosurgery;  Laterality: N/A;  anterolateral   BACK SURGERY     pins   BACK SURGERY  07/11/2008   BIOPSY  06/29/2021   Procedure: BIOPSY;  Surgeon: Harvel Quale, MD;  [facility];  Service: Gastroenterology;;   BLADDER REPAIR     CARPAL TUNNEL RELEASE Right    [place]   COLONOSCOPY N/A 06/12/2013   rehman: normal   ESOPHAGOGASTRODUODENOSCOPY (EGD) WITH PROPOFOL N/A 06/29/2021   Procedure: ESOPHAGOGASTRODUODENOSCOPY (EGD) WITH PROPOFOL;  Surgeon: Harvel Quale, MD;  [facility];  Service: Gastroenterology;  Laterality: N/A;  8:40   KNEE ARTHROSCOPY WITH MEDIAL MENISECTOMY Right 05/09/2019   Procedure: KNEE ARTHROSCOPY WITH MEDIAL MENISECTOMY AND LATERAL MENISECTOMY;  Surgeon: Carole Civil, MD;  [facility];  Service: Orthopedics;  Laterality: Right;   KNEE ARTHROSCOPY WITH MEDIAL  MENISECTOMY Left 06/27/2019   Procedure: KNEE ARTHROSCOPY WITH MEDIAL MENISCECTOMY;  Surgeon: Carole Civil, MD;  [facility];  Service: Orthopedics;  Laterality: Left;   TOTAL KNEE ARTHROPLASTY Left 03/15/2022   Procedure: TOTAL KNEE ARTHROPLASTY;  Surgeon: Carole Civil, MD;  [facility];  Service: Orthopedics;  Laterality: Left;   Patient Active Problem List   Diagnosis Date Noted   Anxiety 03/22/2022   Asthma 03/22/2022   Bipolar 1 disorder ([place]) 03/22/2022   Chronic pain 03/22/2022   Edema 03/22/2022   Gastro-esophageal reflux 03/22/2022   Hyperlipidemia 03/22/2022   Insomnia 03/22/2022   Menopausal symptom 03/22/2022   Sinusitis 03/22/2022   Type II diabetes mellitus ([place]) 03/22/2022   Urinary incontinence 03/22/2022   Osteoarthritis of left knee 03/15/2022   History of operative procedure on lumbosacral spinal structure 02/15/2022   Urge incontinence 09/02/2021   Cervical radiculopathy 12/30/2020   Pseudoarthrosis of lumbar spine 08/25/2020   Essential (primary) hypertension 06/24/2020   Body mass index (BMI) 45.0-49.9, adult ([place]) 05/06/2020   Carpal tunnel syndrome 04/01/2020   Spondylolisthesis, lumbar region 11/22/2019   Localized swelling of both lower legs 11/12/2019   Lumbar stenosis 11/05/2019   S/P left knee arthroscopy 06/27/2019 07/01/2019   Derangement of posterior horn of medial meniscus of left knee    Primary osteoarthritis of left knee    S/P right knee arthroscopy  05/09/19 05/29/2019   Depression 07/24/2014   LOWER LEG, ARTHRITIS, DEGEN./OSTEO 02/18/2009   DERANGEMENT MENISCUS 02/18/2009   ANSERINE BURSITIS, RIGHT 02/18/2009    PCP: Adaline Sill, NP  REFERRING PROVIDER: Carole Civil, MD  REFERRING DIAG: Primary osteoarthritis of left knee  THERAPY DIAG:  Stiffness of left knee, not elsewhere classified  Acute pain of left knee  Rationale for Evaluation and Treatment Rehabilitation  ONSET DATE:  03/15/22  SUBJECTIVE:   SUBJECTIVE STATEMENT: No new complaints., PERTINENT HISTORY: HTN, asthma, DM, OA, depression, anxiety, bipolar, chronic pain  PAIN:  Are you having pain? Yes: NPRS scale: 0/10 Pain location: left knee Pain description: constant throbbing and squeezing Aggravating factors: compression stocking, walking Relieving factors: ice,medication   PRECAUTIONS: No ultrasound.  WEIGHT BEARING RESTRICTIONS No  PATIENT GOALS reduced pain, improved mobility   OBJECTIVE:   TODAY'S TREATMENT:                                   10/112 EXERCISE LOG  Exercise Repetitions and Resistance Comments  Recumbent bike L2-3 x 15 minutes; progressing to seat 4.   Knee ext machine 10# x 3 minutes.   Ham curl machine 30# x 3 minutes.           Manual:  PROM/stretching x 4 minutes into left knee flexion and extenion. Modalities  Date:  Vaso: Knee, 34 degrees; low pressure, 15 mins, Pain.  Medium                                 ASSESSMENT:  CLINICAL IMPRESSION: The patient is making excellent progress.  She progressed to seat 4 on the recumbent bike and performed weight machines today without complaint.  She achieved left knee flexion to 120 degrees today.  GOALS: Goals reviewed with patient? No  SHORT TERM GOALS: Target date: 04/19/2022  Patient will be independent with her initial HEP.  Baseline: Goal status: MET  2.  Patient will improve her left knee extension within 10 degrees of neutral.  Baseline:  Goal status: MET  LONG TERM GOALS: Target date: 05/10/2022   Patient will be independent with her advanced HEP.  Baseline:  Goal status: IN PROGRESS  2.  Patient will improve her active left knee flexion to at least 120 degrees for improved function navigating stairs.  Baseline: 117 degrees Goal status: IN PROGRESS  3.  Patient will be able to demonstrate active left knee extension within 5 degrees of neutral for improved gait mechanics.  Baseline: 7 degrees  from neutral Goal status: IN PROGRESS  4.  Patient will be able to will be able to safely ambulate at least 80 feet with a cane or the least restrictive assistive device.  Baseline: using SPC for ambulation Goal status: MET  PLAN: PT FREQUENCY: 2x/week  PT DURATION: 6 weeks  PLANNED INTERVENTIONS: Therapeutic exercises, Therapeutic activity, Neuromuscular re-education, Balance training, Gait training, Patient/Family education, Self Care, Joint mobilization, Stair training, Electrical stimulation, Cryotherapy, Moist heat, Vasopneumatic device, Manual therapy, and Re-evaluation  PLAN FOR NEXT SESSION: nustep or recumbent bike, quadriceps strengthening, and modalities as needed   Jamine Highfill, [place], PT 04/21/2022, 10:02 AM

## 2022-04-26 ENCOUNTER — Ambulatory Visit: Payer: Medicare Other | Admitting: *Deleted

## 2022-04-26 ENCOUNTER — Encounter: Payer: Self-pay | Admitting: *Deleted

## 2022-04-26 DIAGNOSIS — M25662 Stiffness of left knee, not elsewhere classified: Secondary | ICD-10-CM

## 2022-04-26 DIAGNOSIS — M25562 Pain in left knee: Secondary | ICD-10-CM

## 2022-04-26 NOTE — Therapy (Signed)
OUTPATIENT PHYSICAL THERAPY LOWER EXTREMITY TREATMENT   Patient Name: [name] MRN: [id number] DOB:11-22-1960, 61 y.o., female Today's Date: 04/26/2022   PT End of Session - 04/26/22 0807     Visit Number 10    Number of Visits 12    Date for PT Re-Evaluation 05/13/22    PT Start Time 0815    PT Stop Time 0905    PT Time Calculation (min) 50 min                Past Medical History:  Diagnosis Date   Anxiety    Arthritis    Asthma    Cancer ([place])    cervical cancer; pt states a long long time ago   Depression    Diabetes mellitus without complication ([place])    Essential (primary) hypertension 06/24/2020   Hyperlipemia    Past Surgical History:  Procedure Laterality Date   ABDOMINAL HYSTERECTOMY     ANTERIOR LAT LUMBAR FUSION N/A 11/05/2019   Procedure: Lumbar Four-Five Anterolateral lumbar interbody fusion with lateral plate;  Surgeon: Kristeen Miss, MD;  Location: [place];  Service: Neurosurgery;  Laterality: N/A;  anterolateral   BACK SURGERY     pins   BACK SURGERY  07/11/2008   BIOPSY  06/29/2021   Procedure: BIOPSY;  Surgeon: Harvel Quale, MD;  [facility];  Service: Gastroenterology;;   BLADDER REPAIR     CARPAL TUNNEL RELEASE Right    [place]   COLONOSCOPY N/A 06/12/2013   rehman: normal   ESOPHAGOGASTRODUODENOSCOPY (EGD) WITH PROPOFOL N/A 06/29/2021   Procedure: ESOPHAGOGASTRODUODENOSCOPY (EGD) WITH PROPOFOL;  Surgeon: Harvel Quale, MD;  [facility];  Service: Gastroenterology;  Laterality: N/A;  8:40   KNEE ARTHROSCOPY WITH MEDIAL MENISECTOMY Right 05/09/2019   Procedure: KNEE ARTHROSCOPY WITH MEDIAL MENISECTOMY AND LATERAL MENISECTOMY;  Surgeon: Carole Civil, MD;  [facility];  Service: Orthopedics;  Laterality: Right;   KNEE ARTHROSCOPY WITH MEDIAL MENISECTOMY Left 06/27/2019   Procedure: KNEE ARTHROSCOPY WITH MEDIAL MENISCECTOMY;  Surgeon: Carole Civil,  MD;  [facility];  Service: Orthopedics;  Laterality: Left;   TOTAL KNEE ARTHROPLASTY Left 03/15/2022   Procedure: TOTAL KNEE ARTHROPLASTY;  Surgeon: Carole Civil, MD;  [facility];  Service: Orthopedics;  Laterality: Left;   Patient Active Problem List   Diagnosis Date Noted   Anxiety 03/22/2022   Asthma 03/22/2022   Bipolar 1 disorder ([place]) 03/22/2022   Chronic pain 03/22/2022   Edema 03/22/2022   Gastro-esophageal reflux 03/22/2022   Hyperlipidemia 03/22/2022   Insomnia 03/22/2022   Menopausal symptom 03/22/2022   Sinusitis 03/22/2022   Type II diabetes mellitus ([place]) 03/22/2022   Urinary incontinence 03/22/2022   Osteoarthritis of left knee 03/15/2022   History of operative procedure on lumbosacral spinal structure 02/15/2022   Urge incontinence 09/02/2021   Cervical radiculopathy 12/30/2020   Pseudoarthrosis of lumbar spine 08/25/2020   Essential (primary) hypertension 06/24/2020   Body mass index (BMI) 45.0-49.9, adult ([place]) 05/06/2020   Carpal tunnel syndrome 04/01/2020   Spondylolisthesis, lumbar region 11/22/2019   Localized swelling of both lower legs 11/12/2019   Lumbar stenosis 11/05/2019   S/P left knee arthroscopy 06/27/2019 07/01/2019   Derangement of posterior horn of medial meniscus of left knee    Primary osteoarthritis of left knee    S/P right knee arthroscopy 05/09/19 05/29/2019   Depression 07/24/2014   LOWER LEG, ARTHRITIS, DEGEN./OSTEO 02/18/2009   DERANGEMENT MENISCUS 02/18/2009  ANSERINE BURSITIS, RIGHT 02/18/2009    PCP: Adaline Sill, NP  REFERRING PROVIDER: Carole Civil, MD  REFERRING DIAG: Primary osteoarthritis of left knee  THERAPY DIAG:  Stiffness of left knee, not elsewhere classified  Acute pain of left knee  Rationale for Evaluation and Treatment Rehabilitation  ONSET DATE: 03/15/22  SUBJECTIVE:   SUBJECTIVE STATEMENT: No new complaints., RT knee pain 4-5/10. To MD Thursday PERTINENT HISTORY: HTN,  asthma, DM, OA, depression, anxiety, bipolar, chronic pain  PAIN:  Are you having pain? Yes: NPRS scale: 4-5/10 Pain location: left knee Pain description: constant throbbing and squeezing Aggravating factors: compression stocking, walking Relieving factors: ice,medication   PRECAUTIONS: No ultrasound.  WEIGHT BEARING RESTRICTIONS No  PATIENT GOALS reduced pain, improved mobility   OBJECTIVE:   TODAY'S TREATMENT:                                   10/ 17          EXERCISE LOG  Exercise Repetitions and Resistance Comments  Recumbent bike L2-3 x 15 minutes; progressing to seat 4.   Knee ext machine 10# x 3 minutes.   Ham curl machine 30# x 3 minutes.           Manual:  PROM/stretching x 5 minutes into left knee flexion and extenion. Modalities  Date:  Vaso: Knee, 34 degrees; low pressure, 15 mins, Pain.  Medium                                 ASSESSMENT:  CLINICAL IMPRESSION:               Pt arrived today doing fairly well with RT TKR and is pleased with current status and feels that she will be abl to DC after her 12th visit. Her current AROM is 6-118 degrees and PROM 2-122 degrees     . She was able to continue current therex for ROM progression and strengthening.  04/26/22 PROGRESS REPORT: Patient is making good progress with skilled physical therapy as evidenced by her subjective reports, objective measures, and progress toward her goals for therapy. She was able to meet all of her short term goals. However, she has nearly met her long term goals. Her left knee AROM continues to be slightly limited, but it has significantly improved from her initial evaluation. Recommend that she continue with her current plan of care to address her remaining impairments prior to being discharged with an updated HEP.   Jacqulynn Cadet, PT, DPT   GOALS: Goals reviewed with patient? No  SHORT TERM GOALS: Target date: 04/19/2022  Patient will be independent with her initial HEP.   Baseline: Goal status: MET  2.  Patient will improve her left knee extension within 10 degrees of neutral.  Baseline:  Goal status: MET  LONG TERM GOALS: Target date: 05/10/2022   Patient will be independent with her advanced HEP.  Baseline:  Goal status: IN PROGRESS  2.  Patient will improve her active left knee flexion to at least 120 degrees for improved function navigating stairs.  Baseline: 117 degrees Goal status: IN PROGRESS  3.  Patient will be able to demonstrate active left knee extension within 5 degrees of neutral for improved gait mechanics.  Baseline: 7 degrees from neutral Goal status: IN PROGRESS  4.  Patient will be able to will  be able to safely ambulate at least 80 feet with a cane or the least restrictive assistive device.  Baseline: using SPC for ambulation Goal status: MET  PLAN: PT FREQUENCY: 2x/week  PT DURATION: 6 weeks  PLANNED INTERVENTIONS: Therapeutic exercises, Therapeutic activity, Neuromuscular re-education, Balance training, Gait training, Patient/Family education, Self Care, Joint mobilization, Stair training, Electrical stimulation, Cryotherapy, Moist heat, Vasopneumatic device, Manual therapy, and Re-evaluation  PLAN FOR NEXT SESSION: nustep or recumbent bike, quadriceps strengthening, and modalities as needed. MD note sent today.   Dawn Convery,CHRIS, PTA 04/26/2022, 9:14 AM

## 2022-04-28 ENCOUNTER — Encounter: Payer: Self-pay | Admitting: Physical Therapy

## 2022-04-28 ENCOUNTER — Encounter: Payer: Self-pay | Admitting: Orthopedic Surgery

## 2022-04-28 ENCOUNTER — Ambulatory Visit: Payer: Medicare Other | Admitting: Physical Therapy

## 2022-04-28 ENCOUNTER — Ambulatory Visit (INDEPENDENT_AMBULATORY_CARE_PROVIDER_SITE_OTHER): Payer: Medicare Other | Admitting: Orthopedic Surgery

## 2022-04-28 DIAGNOSIS — M25562 Pain in left knee: Secondary | ICD-10-CM

## 2022-04-28 DIAGNOSIS — M25662 Stiffness of left knee, not elsewhere classified: Secondary | ICD-10-CM

## 2022-04-28 DIAGNOSIS — T8131XA Disruption of external operation (surgical) wound, not elsewhere classified, initial encounter: Secondary | ICD-10-CM

## 2022-04-28 DIAGNOSIS — Z96652 Presence of left artificial knee joint: Secondary | ICD-10-CM

## 2022-04-28 NOTE — Progress Notes (Signed)
FOLLOW UP   Encounter Diagnoses  Name Primary?   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  However, a scab came off of the incision and now she has some purulent drainage.  There is no surrounding erythema she has full passive range of motion of the knee without pain or range of motion of 0-120 her knee pain has decreased significantly   This appears to be a superficial stitch abscess   Recommend washing with soap and water, Neosporin keep it covered come back in 2 weeks after taking Bactrim      Meds ordered this encounter  Medications   sulfamethoxazole-trimethoprim (BACTRIM DS) 800-160 MG tablet      Sig: Take 1 tablet by mouth 2 (two) times daily.      Dispense:  28 tablet      Refill:  0     TODAY POD # 44  She still has the 4 mm superficial wound. Her rom is 0-120 degrees Her pain is well controlled  She is ambulating with and without the cane   She doesn't look infected  She will keep applying neosporin and cover the wound  I ll see her in 2 weeks

## 2022-04-28 NOTE — Therapy (Signed)
OUTPATIENT PHYSICAL THERAPY LOWER EXTREMITY TREATMENT   Patient Name: [name] MRN: [id number] DOB:1960/11/24, 61 y.o., female Today's Date: 04/28/2022   PT End of Session - 04/28/22 0817     Visit Number 10    Number of Visits 12    Date for PT Re-Evaluation 05/13/22    PT Start Time 0818    PT Stop Time 0855    PT Time Calculation (min) 37 min    Activity Tolerance Patient tolerated treatment well    Behavior During Therapy [facility] for tasks assessed/performed            Past Medical History:  Diagnosis Date   Anxiety    Arthritis    Asthma    Cancer ([place])    cervical cancer; pt states a long long time ago   Depression    Diabetes mellitus without complication ([place])    Essential (primary) hypertension 06/24/2020   Hyperlipemia    Past Surgical History:  Procedure Laterality Date   ABDOMINAL HYSTERECTOMY     ANTERIOR LAT LUMBAR FUSION N/A 11/05/2019   Procedure: Lumbar Four-Five Anterolateral lumbar interbody fusion with lateral plate;  Surgeon: Kristeen Miss, MD;  Location: [place];  Service: Neurosurgery;  Laterality: N/A;  anterolateral   BACK SURGERY     pins   BACK SURGERY  07/11/2008   BIOPSY  06/29/2021   Procedure: BIOPSY;  Surgeon: Harvel Quale, MD;  [facility];  Service: Gastroenterology;;   BLADDER REPAIR     CARPAL TUNNEL RELEASE Right    [place]   COLONOSCOPY N/A 06/12/2013   rehman: normal   ESOPHAGOGASTRODUODENOSCOPY (EGD) WITH PROPOFOL N/A 06/29/2021   Procedure: ESOPHAGOGASTRODUODENOSCOPY (EGD) WITH PROPOFOL;  Surgeon: Harvel Quale, MD;  [facility];  Service: Gastroenterology;  Laterality: N/A;  8:40   KNEE ARTHROSCOPY WITH MEDIAL MENISECTOMY Right 05/09/2019   Procedure: KNEE ARTHROSCOPY WITH MEDIAL MENISECTOMY AND LATERAL MENISECTOMY;  Surgeon: Carole Civil, MD;  [facility];  Service: Orthopedics;  Laterality: Right;   KNEE ARTHROSCOPY WITH MEDIAL  MENISECTOMY Left 06/27/2019   Procedure: KNEE ARTHROSCOPY WITH MEDIAL MENISCECTOMY;  Surgeon: Carole Civil, MD;  [facility];  Service: Orthopedics;  Laterality: Left;   TOTAL KNEE ARTHROPLASTY Left 03/15/2022   Procedure: TOTAL KNEE ARTHROPLASTY;  Surgeon: Carole Civil, MD;  [facility];  Service: Orthopedics;  Laterality: Left;   Patient Active Problem List   Diagnosis Date Noted   Anxiety 03/22/2022   Asthma 03/22/2022   Bipolar 1 disorder ([place]) 03/22/2022   Chronic pain 03/22/2022   Edema 03/22/2022   Gastro-esophageal reflux 03/22/2022   Hyperlipidemia 03/22/2022   Insomnia 03/22/2022   Menopausal symptom 03/22/2022   Sinusitis 03/22/2022   Type II diabetes mellitus ([place]) 03/22/2022   Urinary incontinence 03/22/2022   Osteoarthritis of left knee 03/15/2022   History of operative procedure on lumbosacral spinal structure 02/15/2022   Urge incontinence 09/02/2021   Cervical radiculopathy 12/30/2020   Pseudoarthrosis of lumbar spine 08/25/2020   Essential (primary) hypertension 06/24/2020   Body mass index (BMI) 45.0-49.9, adult ([place]) 05/06/2020   Carpal tunnel syndrome 04/01/2020   Spondylolisthesis, lumbar region 11/22/2019   Localized swelling of both lower legs 11/12/2019   Lumbar stenosis 11/05/2019   S/P left knee arthroscopy 06/27/2019 07/01/2019   Derangement of posterior horn of medial meniscus of left knee    Primary osteoarthritis of left knee    S/P right knee arthroscopy 05/09/19 05/29/2019  Depression 07/24/2014   LOWER LEG, ARTHRITIS, DEGEN./OSTEO 02/18/2009   DERANGEMENT MENISCUS 02/18/2009   ANSERINE BURSITIS, RIGHT 02/18/2009    PCP: Adaline Sill, NP  REFERRING PROVIDER: Carole Civil, MD  REFERRING DIAG: Primary osteoarthritis of left knee  THERAPY DIAG:  Stiffness of left knee, not elsewhere classified  Acute pain of left knee  Rationale for Evaluation and Treatment Rehabilitation  ONSET DATE:  03/15/22  SUBJECTIVE:   SUBJECTIVE STATEMENT: Reports some soreness.  PERTINENT HISTORY: HTN, asthma, DM, OA, depression, anxiety, bipolar, chronic pain  PAIN:  Are you having pain? Yes: NPRS scale: 4/10 Pain location: left knee Pain description: constant throbbing and squeezing Aggravating factors: compression stocking, walking Relieving factors: ice,medication   PRECAUTIONS: No ultrasound.  WEIGHT BEARING RESTRICTIONS No  PATIENT GOALS reduced pain, improved mobility  OBJECTIVE:   AROM Right  Left 10/19  Hip flexion    Hip extension    Hip abduction    Hip adduction    Hip internal rotation    Hip external rotation    Knee flexion  127  Knee extension  0  Ankle dorsiflexion    Ankle plantarflexion    Ankle inversion    Ankle eversion     (Blank rows = not tested)   TODAY'S TREATMENT:                                   10/ 19        EXERCISE LOG  Exercise Repetitions and Resistance Comments  Recumbent bike L3 x 15 minutes; progressing to seat 4.   Knee ext machine 10# x 30 reps   Ham curl machine 30# x 30 reps             Modalities  Date:  Vaso: Knee, 34 degrees; low pressure, 10 mins, Pain.    ASSESSMENT:  CLINICAL IMPRESSION:              Patient presented in clinic with mild knee soreness. Patient able to progress herself with seat on stationary bike. Patient fatigued with machine strengthening especially knee extension. AROM of L knee measured as 0-127 deg. Normal vasopnuematic response noted following removal of the modality.  GOALS: Goals reviewed with patient? No  SHORT TERM GOALS: Target date: 04/19/2022  Patient will be independent with her initial HEP.  Baseline: Goal status: MET  2.  Patient will improve her left knee extension within 10 degrees of neutral.  Baseline:  Goal status: MET  LONG TERM GOALS: Target date: 05/10/2022   Patient will be independent with her advanced HEP.  Baseline:  Goal status: IN PROGRESS  2.   Patient will improve her active left knee flexion to at least 120 degrees for improved function navigating stairs.  Baseline: 117 degrees Goal status: MET  3.  Patient will be able to demonstrate active left knee extension within 5 degrees of neutral for improved gait mechanics.  Baseline: 7 degrees from neutral Goal status: MET  4.  Patient will be able to will be able to safely ambulate at least 80 feet with a cane or the least restrictive assistive device.  Baseline: using SPC for ambulation Goal status: MET  PLAN: PT FREQUENCY: 2x/week  PT DURATION: 6 weeks  PLANNED INTERVENTIONS: Therapeutic exercises, Therapeutic activity, Neuromuscular re-education, Balance training, Gait training, Patient/Family education, Self Care, Joint mobilization, Stair training, Electrical stimulation, Cryotherapy, Moist heat, Vasopneumatic device, Manual therapy, and Re-evaluation  PLAN FOR NEXT SESSION: nustep or recumbent bike, quadriceps strengthening, and modalities as needed.    Standley Brooking, PTA 04/28/2022, 9:44 AM

## 2022-05-03 ENCOUNTER — Encounter: Payer: Self-pay | Admitting: Physical Therapy

## 2022-05-03 ENCOUNTER — Ambulatory Visit: Payer: Medicare Other | Admitting: Physical Therapy

## 2022-05-03 DIAGNOSIS — M25562 Pain in left knee: Secondary | ICD-10-CM

## 2022-05-03 DIAGNOSIS — M25662 Stiffness of left knee, not elsewhere classified: Secondary | ICD-10-CM | POA: Diagnosis not present

## 2022-05-03 NOTE — Therapy (Signed)
OUTPATIENT PHYSICAL THERAPY LOWER EXTREMITY TREATMENT   Patient Name: [name] MRN: [id number] DOB:11/03/1960, 61 y.o., female Today's Date: 05/03/2022   PT End of Session - 05/03/22 0822     Visit Number 11    Number of Visits 12    Date for PT Re-Evaluation 05/13/22    PT Start Time 0815    PT Stop Time 0853    PT Time Calculation (min) 38 min    Activity Tolerance Patient tolerated treatment well    Behavior During Therapy [facility] for tasks assessed/performed            Past Medical History:  Diagnosis Date   Anxiety    Arthritis    Asthma    Cancer ([facility])    cervical cancer; pt states a long long time ago   Depression    Diabetes mellitus without complication ([facility])    Essential (primary) hypertension 06/24/2020   Hyperlipemia    Past Surgical History:  Procedure Laterality Date   ABDOMINAL HYSTERECTOMY     ANTERIOR LAT LUMBAR FUSION N/A 11/05/2019   Procedure: Lumbar Four-Five Anterolateral lumbar interbody fusion with lateral plate;  Surgeon: Elsner, Henry, MD;  [facility];  Service: Neurosurgery;  Laterality: N/A;  anterolateral   BACK SURGERY     pins   BACK SURGERY  07/11/2008   BIOPSY  06/29/2021   Procedure: BIOPSY;  Surgeon: Castaneda Mayorga, Daniel, MD;  [facility];  Service: Gastroenterology;;   BLADDER REPAIR     CARPAL TUNNEL RELEASE Right    CESAREAN SECTION  1982, 1984   COLONOSCOPY N/A 06/12/2013   rehman: normal   ESOPHAGOGASTRODUODENOSCOPY (EGD) WITH PROPOFOL N/A 06/29/2021   Procedure: ESOPHAGOGASTRODUODENOSCOPY (EGD) WITH PROPOFOL;  Surgeon: Castaneda Mayorga, Daniel, MD;  [facility];  Service: Gastroenterology;  Laterality: N/A;  8:40   KNEE ARTHROSCOPY WITH MEDIAL MENISECTOMY Right 05/09/2019   Procedure: KNEE ARTHROSCOPY WITH MEDIAL MENISECTOMY AND LATERAL MENISECTOMY;  Surgeon: Harrison, Stanley E, MD;  [facility];  Service: Orthopedics;  Laterality: Right;   KNEE ARTHROSCOPY WITH MEDIAL  MENISECTOMY Left 06/27/2019   Procedure: KNEE ARTHROSCOPY WITH MEDIAL MENISCECTOMY;  Surgeon: Harrison, Stanley E, MD;  [facility];  Service: Orthopedics;  Laterality: Left;   TOTAL KNEE ARTHROPLASTY Left 03/15/2022   Procedure: TOTAL KNEE ARTHROPLASTY;  Surgeon: Harrison, Stanley E, MD;  [facility];  Service: Orthopedics;  Laterality: Left;   Patient Active Problem List   Diagnosis Date Noted   Anxiety 03/22/2022   Asthma 03/22/2022   Bipolar 1 disorder (H[facility]) 03/22/2022   Chronic pain 03/22/2022   Edema 03/22/2022   Gastro-esophageal reflux 03/22/2022   Hyperlipidemia 03/22/2022   Insomnia 03/22/2022   Menopausal symptom 03/22/2022   Sinusitis 03/22/2022   Type II diabetes mellitus ([facility]CC) 03/22/2022   Urinary incontinence 03/22/2022   Osteoarthritis of left knee 03/15/2022   History of operative procedure on lumbosacral spinal structure 02/15/2022   Urge incontinence 09/02/2021   Cervical radiculopathy 12/30/2020   Pseudoarthrosis of lumbar spine 08/25/2020   Essential (primary) hypertension 06/24/2020   Body mass index (BMI) 45.0-49.9, adult ([facility]) 05/06/2020   Carpal tunnel syndrome 04/01/2020   Spondylolisthesis, lumbar region 11/22/2019   Localized swelling of both lower legs 11/12/2019   Lumbar stenosis 11/05/2019   S/P left knee arthroscopy 06/27/2019 07/01/2019   Derangement of posterior horn of medial meniscus of left knee    Primary osteoarthritis of left knee    S/P right knee arthroscopy 05/09/19 05/29/2019     Depression 07/24/2014   LOWER LEG, ARTHRITIS, DEGEN./OSTEO 02/18/2009   DERANGEMENT MENISCUS 02/18/2009   ANSERINE BURSITIS, RIGHT 02/18/2009    PCP: Adaline Sill, NP  REFERRING PROVIDER: Carole Civil, MD  REFERRING DIAG: Primary osteoarthritis of left knee  THERAPY DIAG:  Stiffness of left knee, not elsewhere classified  Acute pain of left knee  Rationale for Evaluation and Treatment Rehabilitation  ONSET DATE:  03/15/22  SUBJECTIVE:   SUBJECTIVE STATEMENT: No AD today as she has not been using one around her home and decided not to bring to PT today.  PERTINENT HISTORY: HTN, asthma, DM, OA, depression, anxiety, bipolar, chronic pain  PAIN:  Are you having pain? Yes: NPRS scale: 2-3/10 Pain location: left knee Pain description: sore Aggravating factors: compression stocking, walking Relieving factors: ice,medication   PRECAUTIONS: No ultrasound.  WEIGHT BEARING RESTRICTIONS No  PATIENT GOALS reduced pain, improved mobility  OBJECTIVE:   AROM Right  Left 10/19  Hip flexion    Hip extension    Hip abduction    Hip adduction    Hip internal rotation    Hip external rotation    Knee flexion  127  Knee extension  0  Ankle dorsiflexion    Ankle plantarflexion    Ankle inversion    Ankle eversion     (Blank rows = not tested)   TODAY'S TREATMENT:                                   10/24      EXERCISE LOG  Exercise Repetitions and Resistance Comments  Recumbent bike L3 x 15 minutes; progressing to seat 4.   Knee ext machine 10# x 30 reps   Ham curl machine 30# x 30 reps   Step down LLE 4" step x15 reps   Lateral step up LLE 4" step x15 reps   Sit <> stands X10 reps no UE support     Modalities  Date:  Vaso: Knee, 34 degrees; low pressure, 10 mins, Pain.    ASSESSMENT:  CLINICAL IMPRESSION:             Patient presented in clinic with mild L knee soreness which she stated was more muscle soreness. Patient presented in clinic with no AD and gait was observed WNL. Patient reports feeling safe within her home enviroment with stairs, etc. Patient able to tolerate all therex with eccentric quad strengthening well with no complaints. Normal modalities response noted following removal of the modality. Patient okay with DC next visit.  GOALS: Goals reviewed with patient? No  SHORT TERM GOALS: Target date: 04/19/2022  Patient will be independent with her initial HEP.   Baseline: Goal status: MET  2.  Patient will improve her left knee extension within 10 degrees of neutral.  Baseline:  Goal status: MET  LONG TERM GOALS: Target date: 05/10/2022   Patient will be independent with her advanced HEP.  Baseline:  Goal status: IN PROGRESS  2.  Patient will improve her active left knee flexion to at least 120 degrees for improved function navigating stairs.  Baseline: 117 degrees Goal status: MET  3.  Patient will be able to demonstrate active left knee extension within 5 degrees of neutral for improved gait mechanics.  Baseline: 7 degrees from neutral Goal status: MET  4.  Patient will be able to will be able to safely ambulate at least 80 feet with a cane  or the least restrictive assistive device.  Baseline: using SPC for ambulation Goal status: MET  PLAN: PT FREQUENCY: 2x/week  PT DURATION: 6 weeks  PLANNED INTERVENTIONS: Therapeutic exercises, Therapeutic activity, Neuromuscular re-education, Balance training, Gait training, Patient/Family education, Self Care, Joint mobilization, Stair training, Electrical stimulation, Cryotherapy, Moist heat, Vasopneumatic device, Manual therapy, and Re-evaluation  PLAN FOR NEXT SESSION: DC and [place], PTA 05/03/2022, 8:55 AM

## 2022-05-05 ENCOUNTER — Ambulatory Visit: Payer: Medicare Other | Admitting: Physical Therapy

## 2022-05-05 ENCOUNTER — Encounter: Payer: Self-pay | Admitting: Physical Therapy

## 2022-05-05 DIAGNOSIS — M25662 Stiffness of left knee, not elsewhere classified: Secondary | ICD-10-CM | POA: Diagnosis not present

## 2022-05-05 DIAGNOSIS — M25562 Pain in left knee: Secondary | ICD-10-CM

## 2022-05-05 NOTE — Therapy (Addendum)
OUTPATIENT PHYSICAL THERAPY LOWER EXTREMITY TREATMENT   Patient Name: [name] MRN: [id number] DOB:1960/08/18, 61 y.o., female Today's Date: 05/05/2022   PT End of Session - 05/05/22 0817     Visit Number 12    Number of Visits 12    Date for PT Re-Evaluation 05/13/22    PT Start Time 0815    PT Stop Time 0857    PT Time Calculation (min) 42 min    Activity Tolerance Patient tolerated treatment well    Behavior During Therapy [facility] for tasks assessed/performed            Past Medical History:  Diagnosis Date   Anxiety    Arthritis    Asthma    Cancer ([place])    cervical cancer; pt states a long long time ago   Depression    Diabetes mellitus without complication ([place])    Essential (primary) hypertension 06/24/2020   Hyperlipemia    Past Surgical History:  Procedure Laterality Date   ABDOMINAL HYSTERECTOMY     ANTERIOR LAT LUMBAR FUSION N/A 11/05/2019   Procedure: Lumbar Four-Five Anterolateral lumbar interbody fusion with lateral plate;  Surgeon: Kristeen Miss, MD;  Location: [place];  Service: Neurosurgery;  Laterality: N/A;  anterolateral   BACK SURGERY     pins   BACK SURGERY  07/11/2008   BIOPSY  06/29/2021   Procedure: BIOPSY;  Surgeon: Harvel Quale, MD;  [facility];  Service: Gastroenterology;;   BLADDER REPAIR     CARPAL TUNNEL RELEASE Right    [place]   COLONOSCOPY N/A 06/12/2013   rehman: normal   ESOPHAGOGASTRODUODENOSCOPY (EGD) WITH PROPOFOL N/A 06/29/2021   Procedure: ESOPHAGOGASTRODUODENOSCOPY (EGD) WITH PROPOFOL;  Surgeon: Harvel Quale, MD;  [facility];  Service: Gastroenterology;  Laterality: N/A;  8:40   KNEE ARTHROSCOPY WITH MEDIAL MENISECTOMY Right 05/09/2019   Procedure: KNEE ARTHROSCOPY WITH MEDIAL MENISECTOMY AND LATERAL MENISECTOMY;  Surgeon: Carole Civil, MD;  [facility];  Service: Orthopedics;  Laterality: Right;   KNEE ARTHROSCOPY WITH MEDIAL  MENISECTOMY Left 06/27/2019   Procedure: KNEE ARTHROSCOPY WITH MEDIAL MENISCECTOMY;  Surgeon: Carole Civil, MD;  [facility];  Service: Orthopedics;  Laterality: Left;   TOTAL KNEE ARTHROPLASTY Left 03/15/2022   Procedure: TOTAL KNEE ARTHROPLASTY;  Surgeon: Carole Civil, MD;  [facility];  Service: Orthopedics;  Laterality: Left;   Patient Active Problem List   Diagnosis Date Noted   Anxiety 03/22/2022   Asthma 03/22/2022   Bipolar 1 disorder ([place]) 03/22/2022   Chronic pain 03/22/2022   Edema 03/22/2022   Gastro-esophageal reflux 03/22/2022   Hyperlipidemia 03/22/2022   Insomnia 03/22/2022   Menopausal symptom 03/22/2022   Sinusitis 03/22/2022   Type II diabetes mellitus ([place]) 03/22/2022   Urinary incontinence 03/22/2022   Osteoarthritis of left knee 03/15/2022   History of operative procedure on lumbosacral spinal structure 02/15/2022   Urge incontinence 09/02/2021   Cervical radiculopathy 12/30/2020   Pseudoarthrosis of lumbar spine 08/25/2020   Essential (primary) hypertension 06/24/2020   Body mass index (BMI) 45.0-49.9, adult ([place]) 05/06/2020   Carpal tunnel syndrome 04/01/2020   Spondylolisthesis, lumbar region 11/22/2019   Localized swelling of both lower legs 11/12/2019   Lumbar stenosis 11/05/2019   S/P left knee arthroscopy 06/27/2019 07/01/2019   Derangement of posterior horn of medial meniscus of left knee    Primary osteoarthritis of left knee    S/P right knee arthroscopy 05/09/19 05/29/2019  Depression 07/24/2014   LOWER LEG, ARTHRITIS, DEGEN./OSTEO 02/18/2009   DERANGEMENT MENISCUS 02/18/2009   ANSERINE BURSITIS, RIGHT 02/18/2009    PCP: Adaline Sill, NP  REFERRING PROVIDER: Carole Civil, MD  REFERRING DIAG: Primary osteoarthritis of left knee  THERAPY DIAG:  Stiffness of left knee, not elsewhere classified  Acute pain of left knee  Rationale for Evaluation and Treatment Rehabilitation  ONSET DATE:  03/15/22  SUBJECTIVE:   SUBJECTIVE STATEMENT: Reporting only muscle soreness. Can tell great improvements.  PERTINENT HISTORY: HTN, asthma, DM, OA, depression, anxiety, bipolar, chronic pain  PAIN:  Are you having pain? Yes: NPRS scale: 2-3/10 Pain location: left knee Pain description: sore Aggravating factors: compression stocking, walking Relieving factors: ice,medication   PRECAUTIONS: No ultrasound.  PATIENT GOALS reduced pain, improved mobility  OBJECTIVE:   AROM Right  Left 10/19  Hip flexion    Hip extension    Hip abduction    Hip adduction    Hip internal rotation    Hip external rotation    Knee flexion  127  Knee extension  0  Ankle dorsiflexion    Ankle plantarflexion    Ankle inversion    Ankle eversion     (Blank rows = not tested)   TODAY'S TREATMENT:                                   10/26      EXERCISE LOG  Exercise Repetitions and Resistance Comments  Recumbent bike L4 x 15 minutes; progressing to seat 4.   Knee ext machine 10# x 30 reps   Ham curl machine 40# x 30 reps   Stair training X2 RT; nonreciprical gait; greater challenge ascending with LLE   Sit <> stands X10 reps no UE support    Modalities  Date: 10/26 Vaso: Knee, 34 degrees; low pressure, 10 mins, Pain.    ASSESSMENT:  CLINICAL IMPRESSION:             Patient presented in clinic with reports of only muscle soreness mildly in LLE. Patient able to tell she has made great improvement since beginning PT following L TKR. Patient able to achieve most goals set at evaluation. No complaints of pain during therex session. No questions or concerns from patient today. Patient more comfortable with nonreciprical stair gait at this time with use of handrail unilaterally. Normal vasopneumatic response noted following removal of the modality.  GOALS: Goals reviewed with patient? No  SHORT TERM GOALS: Target date: 04/19/2022  Patient will be independent with her initial HEP.   Baseline: Goal status: MET  2.  Patient will improve her left knee extension within 10 degrees of neutral.  Baseline:  Goal status: MET  LONG TERM GOALS: Target date: 05/10/2022   Patient will be independent with her advanced HEP.  Baseline:  Goal status: UNABLE TO ASSESS  2.  Patient will improve her active left knee flexion to at least 120 degrees for improved function navigating stairs.  Baseline: 117 degrees Goal status: MET  3.  Patient will be able to demonstrate active left knee extension within 5 degrees of neutral for improved gait mechanics.  Baseline: 7 degrees from neutral Goal status: MET  4.  Patient will be able to will be able to safely ambulate at least 80 feet with a cane or the least restrictive assistive device.  Baseline: using SPC for ambulation Goal status: MET  PLAN:  PT FREQUENCY: 2x/week  PT DURATION: 6 weeks  PLANNED INTERVENTIONS: Therapeutic exercises, Therapeutic activity, Neuromuscular re-education, Balance training, Gait training, Patient/Family education, Self Care, Joint mobilization, Stair training, Electrical stimulation, Cryotherapy, Moist heat, Vasopneumatic device, Manual therapy, and Re-evaluation  PLAN FOR NEXT SESSION: DC and [place], PTA 05/05/2022, 9:35 AM   PHYSICAL THERAPY DISCHARGE SUMMARY  Visits from Start of Care: 12  Current functional level related to goals / functional outcomes: Patient was able to meet all of her goals for skilled physical therapy.    Remaining deficits: None    Education / Equipment: HEP   Patient agrees to discharge. Patient goals were met. Patient is being discharged due to meeting the stated rehab goals.  Jacqulynn Cadet, PT, DPT

## 2022-05-13 ENCOUNTER — Ambulatory Visit (INDEPENDENT_AMBULATORY_CARE_PROVIDER_SITE_OTHER): Payer: Medicare Other | Admitting: Orthopedic Surgery

## 2022-05-13 ENCOUNTER — Encounter: Payer: Self-pay | Admitting: Orthopedic Surgery

## 2022-05-13 DIAGNOSIS — Z96652 Presence of left artificial knee joint: Secondary | ICD-10-CM

## 2022-05-13 NOTE — Progress Notes (Signed)
POST OP VISIT   Chief Complaint  Patient presents with   Routine Post Op    S/p TKA LT DOS 03/15/22 Wound ck/dressing change     POD # 2 months   PROCEDURE  left TKA ATTUNE FB PS   Encounter Diagnosis  Name Primary?   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

## 2022-06-13 ENCOUNTER — Ambulatory Visit (INDEPENDENT_AMBULATORY_CARE_PROVIDER_SITE_OTHER): Payer: Medicare Other | Admitting: Orthopedic Surgery

## 2022-06-13 ENCOUNTER — Encounter: Payer: Self-pay | Admitting: Orthopedic Surgery

## 2022-06-13 VITALS — BP 137/64 | HR 84

## 2022-06-13 DIAGNOSIS — Z96652 Presence of left artificial knee joint: Secondary | ICD-10-CM

## 2022-06-13 DIAGNOSIS — M1712 Unilateral primary osteoarthritis, left knee: Secondary | ICD-10-CM

## 2022-06-13 NOTE — Progress Notes (Signed)
Chief Complaint  Patient presents with   Routine Post Op    S/p TKA LT DOS 03/15/22 Painful and sometimes feels a grinding sensation. When she gets up foot/leg feels numbs. Swelling. Still has a small open area   61 year old female 3 months after left total knee.  She complains of a grinding sensation when she walks and stiffness after sitting for long periods of time.  She had an open area of the wound which is now closed and but still has a little bit of epithelial loss.  She has chronic numbness in the extremities and chronic swelling  Exam shows full extension approximately 110 degrees of flexion she had a little more valgus laxity and then varus laxity no AP laxity  She ambulates and it is noted that she has a leg length discrepancy after the knee replacement  I do not see anything major in terms of any complications  Recommend 69-monthfollow-up continue ice for the knee when swollen and continued the diuretics prescribed by primary care for lower leg edema

## 2022-07-08 ENCOUNTER — Other Ambulatory Visit (INDEPENDENT_AMBULATORY_CARE_PROVIDER_SITE_OTHER): Payer: Self-pay | Admitting: Gastroenterology

## 2022-07-18 ENCOUNTER — Other Ambulatory Visit (INDEPENDENT_AMBULATORY_CARE_PROVIDER_SITE_OTHER): Payer: Self-pay | Admitting: Gastroenterology

## 2022-07-28 ENCOUNTER — Ambulatory Visit (INDEPENDENT_AMBULATORY_CARE_PROVIDER_SITE_OTHER): Payer: 59 | Admitting: Orthopedic Surgery

## 2022-07-28 ENCOUNTER — Ambulatory Visit (INDEPENDENT_AMBULATORY_CARE_PROVIDER_SITE_OTHER): Payer: 59

## 2022-07-28 ENCOUNTER — Encounter: Payer: Self-pay | Admitting: Orthopedic Surgery

## 2022-07-28 DIAGNOSIS — M1712 Unilateral primary osteoarthritis, left knee: Secondary | ICD-10-CM

## 2022-07-28 DIAGNOSIS — Z96652 Presence of left artificial knee joint: Secondary | ICD-10-CM

## 2022-07-28 DIAGNOSIS — M25562 Pain in left knee: Secondary | ICD-10-CM | POA: Diagnosis not present

## 2022-07-28 DIAGNOSIS — M1711 Unilateral primary osteoarthritis, right knee: Secondary | ICD-10-CM | POA: Diagnosis not present

## 2022-07-28 DIAGNOSIS — Z01818 Encounter for other preprocedural examination: Secondary | ICD-10-CM | POA: Diagnosis not present

## 2022-07-28 MED ORDER — BUPIVACAINE-MELOXICAM ER 400-12 MG/14ML IJ SOLN: 400.0000 mg | Freq: Once | INTRAMUSCULAR | Status: DC

## 2022-07-28 NOTE — Addendum Note (Signed)
Addended byCandice Camp on: 07/28/2022 03:15 PM   Modules accepted: Orders

## 2022-07-28 NOTE — Progress Notes (Signed)
Chief Complaint  Patient presents with   Post-op Follow-up    Left knee painful popping and painful after replacement 03/15/22 wants xray / also states incision not healed completely either    Radiology report AP lateral sunrise left knee  Compared to the x-ray taken on March 15, 2022 there is been no interval change the components are aligned properly and there is no evidence of loosening  Just soft tissue soreness around the joint I gave her a model of the knee;  it showed that it does make some noise.  She does not appear to have patellar clunk  She has right knee symptoms as well and would like to proceed with right total knee.  The right knee does not come to full extension she can bend it about 110 degrees it is otherwise stable  She does have chronic peripheral edema takes fluid pills  Scheduled for right total knee as long as all the laboratory work is okay

## 2022-07-28 NOTE — Patient Instructions (Signed)
Your surgery will be at [facility] by Dr Harrison  plan to be in hospital overnight. The hospital will contact you with a preoperative appointment to discuss Anesthesia.  Please arrive on time or 15 minutes early for the preoperative appointment, they have a very tight schedule if you are late or do not come in your surgery will be cancelled.  The phone number for the preop area is [phone]. Please bring your medications with you for the appointment. They will tell you the arrival time for surgery and medication instructions when you have your preoperative evaluation. Do not wear nail polish the day of your surgery and if you take Phentermine you need to stop this medication ONE WEEK prior to your surgery. f you take Invokana, Farxiga, Jardiance, or Steglatro) - Hold 72 hours before the procedure.  If you take Ozempic,  Bydureon or Trulicity do not take for 8 days before your surgery. If you take Victoza, Rybelsis, Saxenda or Adlyxi stop 24 hours before the procedure. Please arrive at the hospital 2 hours before procedure if scheduled at 9:30 or later in the day or at the time the nurse tells you at your preoperative visit.   If you have my chart do not use the time given in my chart use the time given to you by the nurse during your preoperative visit.   Your surgery  time may change. Please be available for phone calls the day of your surgery and the day before. The Short Stay department may need to discuss changes about your surgery time. Not reaching the you could lead to procedure delays and possible cancellation.  You must have a ride home and someone to stay with you for 24 to 48 hours. The person taking you home will receive and sign for the your discharge instructions.  Please be prepared to give your support person's name and telephone number to [facility] [employer]. Dr Harrison will need that name and phone number post procedure.   You will also get a call from a representative of [employer], they have a machine that you will use in the first few weeks after surgery. It is called a CPM.   You will have home physical therapy for 2 weeks after surgery, the home health agency will call you before or just following the surgery to set up visits. [facility] is the agency we normally use, unless you request another agency.   You will get a call also from outpatient therapy for therapy starting when the home therapy is done.  If you have questions or need to Reschedule the surgery, call the office ask for Kleo Dungee.    

## 2022-08-02 NOTE — Addendum Note (Signed)
Addended byCandice Camp on: 08/02/2022 10:43 AM   Modules accepted: Orders

## 2022-08-10 ENCOUNTER — Telehealth: Payer: Self-pay | Admitting: Radiology

## 2022-08-10 NOTE — Telephone Encounter (Signed)
[place] [facility] did not like your last note when you discussed total knee replacement with patient. They are asking for more information. They need to know severity of pain and details of functional disability that interfere with ADL's and they need to know prior therapies treatments tried failed or contraindicated with the dates duration and reason for discontinuation or the total knee replacement will not be approved.  Can you do addendum to previous note or do I need to have her come in for a new office visit to review the above?

## 2022-08-10 NOTE — Telephone Encounter (Signed)
Notified patient Sent message to [facility]

## 2022-08-10 NOTE — Telephone Encounter (Signed)
No addendum   Cancel the surgery for now   Set up appt for her to come in for evaluation and then I ll address all these questions   By the way I ll be out of the office that week

## 2022-08-11 ENCOUNTER — Encounter: Payer: Self-pay | Admitting: Orthopedic Surgery

## 2022-08-17 ENCOUNTER — Ambulatory Visit (INDEPENDENT_AMBULATORY_CARE_PROVIDER_SITE_OTHER): Payer: 59 | Admitting: Orthopedic Surgery

## 2022-08-17 ENCOUNTER — Encounter: Payer: Self-pay | Admitting: Orthopedic Surgery

## 2022-08-17 DIAGNOSIS — M1711 Unilateral primary osteoarthritis, right knee: Secondary | ICD-10-CM

## 2022-08-17 NOTE — Progress Notes (Signed)
Chief Complaint  Patient presents with   Knee Pain    Right    [place] planned to have her right total knee done after left total knee in September 2023  She had taken naproxen and diclofenac in the past as treatments for her arthritis for both knees  She was seen in October 2020 by Dr. 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  Surgery date May 09, 2019  Findings severe arthritis all 3 compartments medial meniscal root tear tear anterior horn and body lateral meniscus mild synovitis   Asked to her symptoms now she has 8 out of 10 pain she has difficulty walking even in the house this is associated with limping.  She cannot stand for any significant period of time which includes cooking and is hard for her to sleep  Hopefully this will be enough information for [place] [facility] to approve her for right total knee arthroplasty

## 2022-08-17 NOTE — Progress Notes (Signed)
Chief Complaint  Patient presents with   Knee Pain    Left    [name] planned to have her right total knee done after left total knee in September 2023  She had taken naproxen and diclofenac in the past as treatments for her arthritis for both knees  She was seen in October 2020 by Dr. 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  Surgery date May 09, 2019  Findings severe arthritis all 3 compartments medial meniscal root tear tear anterior horn and body lateral meniscus mild synovitis   Asked to her symptoms now she has 8 out of 10 pain she has difficulty walking even in the house this is associated with limping.  She cannot stand for any significant period of time which includes cooking and is hard for her to sleep  Hopefully this will be enough information for [place] [facility] to approve her for right total knee arthroplasty

## 2022-08-22 ENCOUNTER — Telehealth: Payer: Self-pay | Admitting: Orthopedic Surgery

## 2022-08-22 NOTE — Telephone Encounter (Signed)
Patient called, stated she has an upcoming surgery with Dr. Aline Brochure and she got a letter from her insurance company and she is confused.  [phone]

## 2022-08-22 NOTE — Telephone Encounter (Signed)
Me too They said they would not approve it without additional information she came in and we sent additional information  Now they say they already approved it but wont approve it without knowing why we sent the additional information but I'm not sure if its still approved or not   I sent more additional information today to them to let them know we sent the additional information because they couldn't approve without it.     Closed the new case they opened when we sent extra information, hopefully this helps  It seems to be approved now.

## 2022-09-05 ENCOUNTER — Other Ambulatory Visit: Payer: Self-pay | Admitting: Internal Medicine

## 2022-09-05 DIAGNOSIS — Z1231 Encounter for screening mammogram for malignant neoplasm of breast: Secondary | ICD-10-CM

## 2022-09-06 ENCOUNTER — Encounter (HOSPITAL_COMMUNITY): Payer: Self-pay | Admitting: Anesthesiology

## 2022-09-08 ENCOUNTER — Encounter: Payer: Self-pay | Admitting: Radiology

## 2022-09-09 ENCOUNTER — Encounter (HOSPITAL_COMMUNITY): Payer: Self-pay | Admitting: Internal Medicine

## 2022-09-09 ENCOUNTER — Ambulatory Visit: Payer: Medicare Other | Admitting: Orthopedic Surgery

## 2022-09-09 DIAGNOSIS — Z1231 Encounter for screening mammogram for malignant neoplasm of breast: Secondary | ICD-10-CM

## 2022-09-19 ENCOUNTER — Telehealth: Payer: Self-pay | Admitting: Orthopedic Surgery

## 2022-09-19 ENCOUNTER — Other Ambulatory Visit: Payer: Self-pay | Admitting: Orthopedic Surgery

## 2022-09-19 DIAGNOSIS — M1711 Unilateral primary osteoarthritis, right knee: Secondary | ICD-10-CM

## 2022-09-19 NOTE — Telephone Encounter (Signed)
She was asking about CPM and home therapy hasn't heard yet Not sent orders yet, told her she will hear something

## 2022-09-19 NOTE — Telephone Encounter (Signed)
The patient lvm stating she would like to speak to Dr. Ruthe Mannan assistant, she has some questions before surgery.  [phone]

## 2022-09-19 NOTE — Patient Instructions (Signed)
[name]  09/19/2022     '@[email or web address]'$ @   Your procedure is scheduled on  09/27/2022.   Report to [facility] at  0600  A.M.   Call this number if you have problems the morning of surgery:  [phone]  If you experience any cold or flu symptoms such as cough, fever, chills, shortness of breath, etc. between now and your scheduled surgery, please notify us at the above number.   Remember:  Do not eat after midnight.    You may drink clear liquids until 0330 am on 09/27/2022.     Clear liquids allowed are:                    Water, Juice (No red color; non-citric and without pulp; diabetics please choose diet or no sugar options), Carbonated beverages (diabetics please choose diet or no sugar options), Clear Tea (No creamer, milk, or cream, including half & half and powdered creamer), Black Coffee Only (No creamer, milk or cream, including half & half and powdered creamer), Plain Jell-O Only (No red color; diabetics please choose no sugar options), Clear Sports drink (No red color; diabetics please choose diet or no sugar options), and Plain Popsicles Only (No red color; diabetics please choose no sugar options)      At 0330 am on 09/27/2022 drink your carb drink. You may have nothing else to drink after this.      Use your inhaler before you come and bring your rescue inhaler with you.    DO NOT take any medications for diabetes the morning of your procedure.    Take these medicines the morning of surgery with A SIP OF WATER        abilify, prozac, gabapentin, omeprazole, oxycodone.     Do not wear jewelry, make-up or nail polish.  Do not wear lotions, powders, or perfumes, or deodorant.  Do not shave 48 hours prior to surgery.  Men may shave face and neck.  Do not bring valuables to the hospital.  [facility] is not responsible for any belongings or valuables.  Contacts, dentures or bridgework may not be worn into surgery.  Leave your suitcase in  the car.  After surgery it may be brought to your room.  For patients admitted to the hospital, discharge time will be determined by your treatment team.  Patients discharged the day of surgery will not be allowed to drive home and must have someone with them for 24 hours.    Special instructions:   DO NOT smoke tobacco or vape for 24 hours before your procedure.   Please read over the following fact sheets that you were given. Pain Booklet, Coughing and Deep Breathing, Blood Transfusion Information, Lab Information, Total Joint Packet, MRSA Information, Surgical Site Infection Prevention, Anesthesia Post-op Instructions, and Care and Recovery After Surgery      Total Knee Replacement, Care After This sheet gives you information about how to care for yourself after your procedure. Your health care provider may also give you more specific instructions. If you have problems or questions, contact your health care provider. What can I expect after the procedure? After the procedure, it is common to have: Redness, pain, and swelling at the incision area. Stiffness. Discomfort. A small amount of blood or clear fluid coming from your incision. Follow these instructions at home: Medicines Take over-the-counter and prescription medicines only as told by your health care provider. If you  were prescribed a blood thinner (anticoagulant), take it as told by your health care provider. Ask your health care provider if the medicine prescribed to you: Requires you to avoid driving or using machinery. Can cause constipation. You may need to take these actions to prevent or treat constipation: Drink enough fluid to keep your urine pale yellow. Take over-the-counter or prescription medicines. Eat foods that are high in fiber, such as beans, whole grains, and fresh fruits and vegetables. Limit foods that are high in fat and processed sugars, such as fried or sweet foods. Incision care  Follow  instructions from your health care provider about how to take care of your incision. Make sure you: Wash your hands with soap and water for at least 20 seconds before and after you change your bandage (dressing). If soap and water are not available, use hand sanitizer. Change your dressing as told by your health care provider. Leave stitches (sutures), staples, skin glue, or adhesive strips in place. These skin closures may need to stay in place for 2 weeks or longer. If adhesive strip edges start to loosen and curl up, you may trim the loose edges. Do not remove adhesive strips completely unless your health care provider tells you to do that. Do not take baths, swim, or use a hot tub until your health care provider approves. Check your incision area every day for signs of infection. Check for: More redness, swelling, or pain. More fluid or blood. Warmth. Pus or a bad smell. Activity Rest as told by your health care provider. Avoid sitting for a long time without moving. Get up to take short walks every 1-2 hours. This is important to improve blood flow and breathing. Ask for help if you feel weak or unsteady. Follow instructions from your health care provider about using a walker, crutches, or a cane. You may use your legs to support (bear) your body weight as told by your health care provider. Follow instructions about how much weight you may safely support on your affected leg (weight-bearing restrictions). A physical therapist may show you how to get out of a bed and chair and how to go up and down stairs. You will first do this with a walker, crutches, or a cane and then without any of these devices. Once you are able to walk without a limp, you may stop using a walker, crutches, or a cane. Do exercises as told by your health care provider or physical therapist. Avoid high-impact activities, including running, jumping rope, and doing jumping jacks. Do not play contact sports until your health  care provider approves. Return to your normal activities as told by your health care provider. Ask your health care provider what activities are safe for you. Managing pain, stiffness, and swelling  If directed, put ice on your knee. To do this: Put ice in a plastic bag or use the icing device (cold flow pad) that you were given. Follow instructions from your health care provider about how to use the icing device. Place a towel between your skin and the bag or between your skin and the icing device. Leave the ice on for 20 minutes, 2-3 times a day. Remove the ice if your skin turns bright red. This is very important. If you cannot feel pain, heat, or cold, you have a greater risk of damage to the area. Move your toes often to reduce stiffness and swelling. Raise (elevate) your leg above the level of your heart while you are sitting  or lying down. Use several pillows to keep your leg straight. Do not put a pillow just under the knee. If the knee is bent for a long time, this may lead to stiffness. Wear elastic knee support as told by your health care provider. Safety  To help prevent falls, keep floors clear of objects you may trip over. Place items that you may need within easy reach. Wear an apron or tool belt with pockets for carrying objects. This leaves your hands free to help with your balance. Ask your health care provider when it is safe to drive. General instructions Wear compression stockings as told by your health care provider. These stockings help to prevent blood clots and reduce swelling in your legs. Continue with breathing exercises. This helps prevent lung infection. Do not use any products that contain nicotine or tobacco. These products include cigarettes, chewing tobacco, and vaping devices, such as e-cigarettes. These can delay healing after surgery. If you need help quitting, ask your health care provider. Tell your health care provider if you plan to have dental work.  Also: Tell your dentist about your joint replacement. Ask your health care provider if there are any special instructions you need to follow before having dental care and routine cleanings. Keep all follow-up visits. This is important. Contact a health care provider if: You have a fever or chills. You have a cough or feel short of breath. Your medicine is not controlling your pain. You have any of these signs of infection: More redness, swelling, or pain around your incision. More fluid or blood coming from your incision. Warmth coming from your incision. Pus or a bad smell coming from your incision. You fall. Get help right away if: You have severe pain. You have trouble breathing. You have chest pain. You have redness, swelling, pain, or warmth in your calf or leg. Your incision breaks open after sutures or staples are removed. These symptoms may represent a serious problem that is an emergency. Do not wait to see if the symptoms will go away. Get medical help right away. Call your local emergency services (911 in the [place] Do not drive yourself to the hospital. Summary After the procedure, it is common to have pain and swelling at the incision area, a small amount of blood or fluid coming from your incision, and stiffness. Follow instructions from your health care provider about how to take care of your incision. Use crutches, a walker, or a cane as told by your health care provider. This information is not intended to replace advice given to you by your health care provider. Make sure you discuss any questions you have with your health care provider. Document Revised: 12/17/2019 Document Reviewed: 12/17/2019 Elsevier Patient Education  [place] Anesthesia, Adult, Care After The following information offers guidance on how to care for yourself after your procedure. Your health care provider may also give you more specific instructions. If you have problems or  questions, contact your health care provider. What can I expect after the procedure? After the procedure, it is common for people to: Have pain or discomfort at the IV site. Have nausea or vomiting. Have a sore throat or hoarseness. Have trouble concentrating. Feel cold or chills. Feel weak, sleepy, or tired (fatigue). Have soreness and body aches. These can affect parts of the body that were not involved in surgery. Follow these instructions at home: For the time period you were told by your health care provider:  Rest. Do  not participate in activities where you could fall or become injured. Do not drive or use machinery. Do not drink alcohol. Do not take sleeping pills or medicines that cause drowsiness. Do not make important decisions or sign legal documents. Do not take care of children on your own. General instructions Drink enough fluid to keep your urine pale yellow. If you have sleep apnea, surgery and certain medicines can increase your risk for breathing problems. Follow instructions from your health care provider about wearing your sleep device: Anytime you are sleeping, including during daytime naps. While taking prescription pain medicines, sleeping medicines, or medicines that make you drowsy. Return to your normal activities as told by your health care provider. Ask your health care provider what activities are safe for you. Take over-the-counter and prescription medicines only as told by your health care provider. Do not use any products that contain nicotine or tobacco. These products include cigarettes, chewing tobacco, and vaping devices, such as e-cigarettes. These can delay incision healing after surgery. If you need help quitting, ask your health care provider. Contact a health care provider if: You have nausea or vomiting that does not get better with medicine. You vomit every time you eat or drink. You have pain that does not get better with medicine. You  cannot urinate or have bloody urine. You develop a skin rash. You have a fever. Get help right away if: You have trouble breathing. You have chest pain. You vomit blood. These symptoms may be an emergency. Get help right away. Call 911. Do not wait to see if the symptoms will go away. Do not drive yourself to the hospital. Summary After the procedure, it is common to have a sore throat, hoarseness, nausea, vomiting, or to feel weak, sleepy, or fatigue. For the time period you were told by your health care provider, do not drive or use machinery. Get help right away if you have difficulty breathing, have chest pain, or vomit blood. These symptoms may be an emergency. This information is not intended to replace advice given to you by your health care provider. Make sure you discuss any questions you have with your health care provider. Document Revised: 09/24/2021 Document Reviewed: 09/24/2021 Elsevier Patient Education  [place]. How to Use Chlorhexidine Before Surgery Chlorhexidine gluconate (CHG) is a germ-killing (antiseptic) solution that is used to clean the skin. It can get rid of the bacteria that normally live on the skin and can keep them away for about 24 hours. To clean your skin with CHG, you may be given: A CHG solution to use in the shower or as part of a sponge bath. A prepackaged cloth that contains CHG. Cleaning your skin with CHG may help lower the risk for infection: While you are staying in the intensive care unit of the hospital. If you have a vascular access, such as a central line, to provide short-term or long-term access to your veins. If you have a catheter to drain urine from your bladder. If you are on a ventilator. A ventilator is a machine that helps you breathe by moving air in and out of your lungs. After surgery. What are the risks? Risks of using CHG include: A skin reaction. Hearing loss, if CHG gets in your ears and you have a perforated  eardrum. Eye injury, if CHG gets in your eyes and is not rinsed out. The CHG product catching fire. Make sure that you avoid smoking and flames after applying CHG to your skin.  Do not use CHG: If you have a chlorhexidine allergy or have previously reacted to chlorhexidine. On babies younger than 41 months of age. How to use CHG solution Use CHG only as told by your health care provider, and follow the instructions on the label. Use the full amount of CHG as directed. Usually, this is one bottle. During a shower Follow these steps when using CHG solution during a shower (unless your health care provider gives you different instructions): Start the shower. Use your normal soap and shampoo to wash your face and hair. Turn off the shower or move out of the shower stream. Pour the CHG onto a clean washcloth. Do not use any type of brush or rough-edged sponge. Starting at your neck, lather your body down to your toes. Make sure you follow these instructions: If you will be having surgery, pay special attention to the part of your body where you will be having surgery. Scrub this area for at least 1 minute. Do not use CHG on your head or face. If the solution gets into your ears or eyes, rinse them well with water. Avoid your genital area. Avoid any areas of skin that have broken skin, cuts, or scrapes. Scrub your back and under your arms. Make sure to wash skin folds. Let the lather sit on your skin for 1-2 minutes or as long as told by your health care provider. Thoroughly rinse your entire body in the shower. Make sure that all body creases and crevices are rinsed well. Dry off with a clean towel. Do not put any substances on your body afterward--such as powder, lotion, or perfume--unless you are told to do so by your health care provider. Only use lotions that are recommended by the manufacturer. Put on clean clothes or pajamas. If it is the night before your surgery, sleep in clean  sheets.  During a sponge bath Follow these steps when using CHG solution during a sponge bath (unless your health care provider gives you different instructions): Use your normal soap and shampoo to wash your face and hair. Pour the CHG onto a clean washcloth. Starting at your neck, lather your body down to your toes. Make sure you follow these instructions: If you will be having surgery, pay special attention to the part of your body where you will be having surgery. Scrub this area for at least 1 minute. Do not use CHG on your head or face. If the solution gets into your ears or eyes, rinse them well with water. Avoid your genital area. Avoid any areas of skin that have broken skin, cuts, or scrapes. Scrub your back and under your arms. Make sure to wash skin folds. Let the lather sit on your skin for 1-2 minutes or as long as told by your health care provider. Using a different clean, wet washcloth, thoroughly rinse your entire body. Make sure that all body creases and crevices are rinsed well. Dry off with a clean towel. Do not put any substances on your body afterward--such as powder, lotion, or perfume--unless you are told to do so by your health care provider. Only use lotions that are recommended by the manufacturer. Put on clean clothes or pajamas. If it is the night before your surgery, sleep in clean sheets. How to use CHG prepackaged cloths Only use CHG cloths as told by your health care provider, and follow the instructions on the label. Use the CHG cloth on clean, dry skin. Do not use the CHG cloth  on your head or face unless your health care provider tells you to. When washing with the CHG cloth: Avoid your genital area. Avoid any areas of skin that have broken skin, cuts, or scrapes. Before surgery Follow these steps when using a CHG cloth to clean before surgery (unless your health care provider gives you different instructions): Using the CHG cloth, vigorously scrub the  part of your body where you will be having surgery. Scrub using a back-and-forth motion for 3 minutes. The area on your body should be completely wet with CHG when you are done scrubbing. Do not rinse. Discard the cloth and let the area air-dry. Do not put any substances on the area afterward, such as powder, lotion, or perfume. Put on clean clothes or pajamas. If it is the night before your surgery, sleep in clean sheets.  For general bathing Follow these steps when using CHG cloths for general bathing (unless your health care provider gives you different instructions). Use a separate CHG cloth for each area of your body. Make sure you wash between any folds of skin and between your fingers and toes. Wash your body in the following order, switching to a new cloth after each step: The front of your neck, shoulders, and chest. Both of your arms, under your arms, and your hands. Your stomach and groin area, avoiding the genitals. Your right leg and foot. Your left leg and foot. The back of your neck, your back, and your buttocks. Do not rinse. Discard the cloth and let the area air-dry. Do not put any substances on your body afterward--such as powder, lotion, or perfume--unless you are told to do so by your health care provider. Only use lotions that are recommended by the manufacturer. Put on clean clothes or pajamas. Contact a health care provider if: Your skin gets irritated after scrubbing. You have questions about using your solution or cloth. You swallow any chlorhexidine. Call your local poison control center (1-347-173-1027 in the [place] Get help right away if: Your eyes itch badly, or they become very red or swollen. Your skin itches badly and is red or swollen. Your hearing changes. You have trouble seeing. You have swelling or tingling in your mouth or throat. You have trouble breathing. These symptoms may represent a serious problem that is an emergency. Do not wait to see if the  symptoms will go away. Get medical help right away. Call your local emergency services (911 in the [place] Do not drive yourself to the hospital. Summary Chlorhexidine gluconate (CHG) is a germ-killing (antiseptic) solution that is used to clean the skin. Cleaning your skin with CHG may help to lower your risk for infection. You may be given CHG to use for bathing. It may be in a bottle or in a prepackaged cloth to use on your skin. Carefully follow your health care provider's instructions and the instructions on the product label. Do not use CHG if you have a chlorhexidine allergy. Contact your health care provider if your skin gets irritated after scrubbing. This information is not intended to replace advice given to you by your health care provider. Make sure you discuss any questions you have with your health care provider. Document Revised: 10/25/2021 Document Reviewed: 09/07/2020 Elsevier Patient Education  [place].

## 2022-09-20 ENCOUNTER — Encounter (HOSPITAL_COMMUNITY): Payer: Self-pay

## 2022-09-20 ENCOUNTER — Telehealth: Payer: Self-pay

## 2022-09-20 ENCOUNTER — Encounter (HOSPITAL_COMMUNITY)
Admission: RE | Admit: 2022-09-20 | Discharge: 2022-09-20 | Disposition: A | Discharge status: Home / Self Care | Payer: 59 | Source: Ambulatory Visit | Attending: Orthopedic Surgery | Admitting: Orthopedic Surgery

## 2022-09-20 VITALS — BP 116/53 | HR 92 | Temp 98.4°F | Resp 18 | Ht 65.0 in | Wt 257.9 lb

## 2022-09-20 DIAGNOSIS — E119 Type 2 diabetes mellitus without complications: Secondary | ICD-10-CM | POA: Diagnosis not present

## 2022-09-20 DIAGNOSIS — I491 Atrial premature depolarization: Secondary | ICD-10-CM | POA: Diagnosis not present

## 2022-09-20 DIAGNOSIS — M1711 Unilateral primary osteoarthritis, right knee: Secondary | ICD-10-CM | POA: Diagnosis not present

## 2022-09-20 DIAGNOSIS — Z01818 Encounter for other preprocedural examination: Secondary | ICD-10-CM | POA: Diagnosis not present

## 2022-09-20 DIAGNOSIS — I10 Essential (primary) hypertension: Secondary | ICD-10-CM

## 2022-09-20 DIAGNOSIS — Z6841 Body Mass Index (BMI) 40.0 and over, adult: Secondary | ICD-10-CM

## 2022-09-20 HISTORY — DX: Dyspnea, unspecified: R06.00

## 2022-09-20 HISTORY — DX: Localized edema: R60.0

## 2022-09-20 LAB — CBC WITH DIFFERENTIAL/PLATELET
Abs Immature Granulocytes: 0.03 10*3/uL (ref 0.00–0.07)
Basophils Absolute: 0.1 10*3/uL (ref 0.0–0.1)
Basophils Relative: 1 %
Eosinophils Absolute: 0.2 10*3/uL (ref 0.0–0.5)
Eosinophils Relative: 1 %
HCT: 36.3 % (ref 36.0–46.0)
Hemoglobin: 10.9 g/dL — ABNORMAL LOW (ref 12.0–15.0)
Immature Granulocytes: 0 %
Lymphocytes Relative: 15 %
Lymphs Abs: 1.6 10*3/uL (ref 0.7–4.0)
MCH: 22.9 pg — ABNORMAL LOW (ref 26.0–34.0)
MCHC: 30 g/dL (ref 30.0–36.0)
MCV: 76.4 fL — ABNORMAL LOW (ref 80.0–100.0)
Monocytes Absolute: 0.7 10*3/uL (ref 0.1–1.0)
Monocytes Relative: 6 %
Neutro Abs: 8.6 10*3/uL — ABNORMAL HIGH (ref 1.7–7.7)
Neutrophils Relative %: 77 %
Platelets: 341 10*3/uL (ref 150–400)
RBC: 4.75 MIL/uL (ref 3.87–5.11)
RDW: 20.1 % — ABNORMAL HIGH (ref 11.5–15.5)
WBC: 11.1 10*3/uL — ABNORMAL HIGH (ref 4.0–10.5)
nRBC: 0 % (ref 0.0–0.2)

## 2022-09-20 LAB — BASIC METABOLIC PANEL
Anion gap: 12 (ref 5–15)
BUN: 6 mg/dL (ref 6–20)
CO2: 23 mmol/L (ref 22–32)
Calcium: 8.7 mg/dL — ABNORMAL LOW (ref 8.9–10.3)
Chloride: 101 mmol/L (ref 98–111)
Creatinine, Ser: 0.75 mg/dL (ref 0.44–1.00)
GFR, Estimated: >60 mL/min (ref >60)
Glucose, Bld: 211 mg/dL — ABNORMAL HIGH (ref 70–99)
Potassium: 3.3 mmol/L — ABNORMAL LOW (ref 3.5–5.1)
Sodium: 136 mmol/L (ref 135–145)

## 2022-09-20 LAB — SURGICAL PCR SCREEN
MRSA, PCR: NEGATIVE
Staphylococcus aureus: NEGATIVE

## 2022-09-20 LAB — PREPARE RBC (CROSSMATCH)

## 2022-09-20 NOTE — Telephone Encounter (Signed)
Patient called to let us know that she is going back to her pain management doctor tomorrow and she was wanting to know if he needs to speak with Dr. Aline Brochure could he call. I told her that Dr. Aline Brochure will not be here tomorrow but if he needs to speak with someone maybe Dr. Ruthe Mannan assistant (Amy) can help him, she stated she understood.

## 2022-09-20 NOTE — Progress Notes (Signed)
Procedure cancelled.  She was seen by her Primary Provider several months ago c/o shortness of breath and increased edema in her arms and legs. Dr Georgina Quint of [facility] in [place] wants her to follow up with Cardiology.  Appointment has been made for 10/05/2022.  Patient will be rescheduled after cardiac clearance.

## 2022-09-21 ENCOUNTER — Ambulatory Visit: Payer: 59

## 2022-09-21 LAB — HEMOGLOBIN A1C
Hgb A1c MFr Bld: 6.8 % — ABNORMAL HIGH (ref 4.8–5.6)
Mean Plasma Glucose: 148 mg/dL

## 2022-09-27 ENCOUNTER — Ambulatory Visit (HOSPITAL_COMMUNITY): Admission: RE | Admit: 2022-09-27 | Payer: 59 | Source: Home / Self Care | Admitting: Orthopedic Surgery

## 2022-09-27 ENCOUNTER — Encounter (HOSPITAL_COMMUNITY): Admission: RE | Payer: Self-pay | Source: Home / Self Care

## 2022-09-27 SURGERY — ARTHROPLASTY, KNEE, TOTAL
Anesthesia: Choice | Site: Knee | Laterality: Right

## 2022-09-29 LAB — TYPE AND SCREEN
ABO/RH(D): O POS
Antibody Screen: NEGATIVE
Unit division: 0
Unit division: 0

## 2022-09-29 LAB — BPAM RBC
Blood Product Expiration Date: 202404192359
Blood Product Expiration Date: 202404202359
Unit Type and Rh: 5100
Unit Type and Rh: 5100

## 2022-10-03 NOTE — Progress Notes (Unsigned)
Cardiology Office Note:   Date:  10/05/2022  NAME:  [name]    MRN: [id number] DOB:  1961-06-24   PCP:  Adaline Sill, NP  Cardiologist:  None  Electrophysiologist:  None   Referring MD: Adaline Sill, NP   Chief Complaint  Patient presents with   Shortness of Breath        History of Present Illness:   [name] is a 62 y.o. female with a hx of HTN, HLD who is being seen today for the evaluation of SOB at the request of Adaline Sill, NP. Anemia noted on labs from PCP.   She reports has been short of breath with activity for the past 2 years.  Also gets discomfort in her chest.  Described as sharp.  Shortness of breath occurs with activity.  Chest discomfort can occur randomly.  Last seconds.  She is also had lower extremity edema.  She is on hydrochlorothiazide.  She is not on torsemide.  Potassium was a bit low on recent lab check.  Also noticeably anemic.  Her MCV is low suggestive of iron deficiency anemia.  EKG shows normal sinus rhythm with no acute ischemic changes.  She reports no prior history of heart attack or stroke.  She is diabetic based on recent lab work.  I do worry about her anemia.  She reports colonoscopy in the past but no repeat evaluation.  She is a current every day smoker.  She reports only smoking for 5 to 6 years.  20 years are listed in the chart.  Kidney function is normal.  She does have some lower extremity edema but I suspect this is related to venous insufficiency in the setting of obesity.  We discussed compression stockings as well as leg elevation.  She is working on losing weight.  She does not exercise.  No drug use or alcohol use is reported.  She is disabled.  She is single.  She has 2 children.  She has several grandchildren.  EKG in office is normal.  CV exam normal.  HGB 9.5 BNP 49.2 Cr 0.70  Problem List DM -A1c 6.8 2. HLD 3. Tobacco abuse    Past Medical History: Past Medical History:  Diagnosis Date    Anxiety    Arthritis    Asthma    Cancer ([place])    cervical cancer; pt states a long long time ago   Depression    Diabetes mellitus without complication ([place])    Dyspnea    Edema of all four extremities    Essential (primary) hypertension 06/24/2020   Hyperlipemia    Type II diabetes mellitus ([place]) 03/22/2022    Past Surgical History: Past Surgical History:  Procedure Laterality Date   ABDOMINAL HYSTERECTOMY     ANTERIOR LAT LUMBAR FUSION N/A 11/05/2019   Procedure: Lumbar Four-Five Anterolateral lumbar interbody fusion with lateral plate;  Surgeon: Kristeen Miss, MD;  Location: [facility];  Service: Neurosurgery;  Laterality: N/A;  anterolateral   BACK SURGERY     pins   BACK SURGERY  07/11/2008   BIOPSY  06/29/2021   Procedure: BIOPSY;  Surgeon: Harvel Quale, MD;  [facility];  Service: Gastroenterology;;   BLADDER REPAIR     CARPAL TUNNEL RELEASE Right    [facility] [place]   COLONOSCOPY N/A 06/12/2013   [place]hman: normal   ESOPHAGOGASTRODUODENOSCOPY (EGD) WITH PROPOFOL N/A 06/29/2021   Procedure: ESOPHAGOGASTRODUODENOSCOPY (EGD) WITH PROPOFOL;  Surgeon: Montez Morita,  Quillian Quince, MD;  [facility];  Service: Gastroenterology;  Laterality: N/A;  8:40   KNEE ARTHROSCOPY WITH MEDIAL MENISECTOMY Right 05/09/2019   Procedure: KNEE ARTHROSCOPY WITH MEDIAL MENISECTOMY AND LATERAL MENISECTOMY;  Surgeon: Carole Civil, MD;  Location: [facility];  Service: Orthopedics;  Laterality: Right;   KNEE ARTHROSCOPY WITH MEDIAL MENISECTOMY Left 06/27/2019   Procedure: KNEE ARTHROSCOPY WITH MEDIAL MENISCECTOMY;  Surgeon: Carole Civil, MD;  Location: [facility];  Service: Orthopedics;  Laterality: Left;   TOTAL KNEE ARTHROPLASTY Left 03/15/2022   Procedure: TOTAL KNEE ARTHROPLASTY;  Surgeon: Carole Civil, MD;  Location: [facility];  Service: Orthopedics;  Laterality: Left;    Current Medications: Current Meds  Medication Sig   albuterol (VENTOLIN  HFA) 108 (90 Base) MCG/ACT inhaler Inhale 1-2 puffs into the lungs every 6 (six) hours as needed for wheezing or shortness of breath.   ARIPiprazole (ABILIFY) 2 MG tablet Take 2 mg by mouth daily.   docusate sodium (COLACE) 100 MG capsule Take 100 mg by mouth daily as needed for moderate constipation.   estradiol (ESTRACE) 2 MG tablet TAKE ONE TABLET AT BEDTIME   FLUoxetine (PROZAC) 40 MG capsule Take 40 mg by mouth daily.   gabapentin (NEURONTIN) 300 MG capsule Take 1 capsule (300 mg total) by mouth 3 (three) times daily. (Patient taking differently: Take 300 mg by mouth 2 (two) times daily.)   hydrochlorothiazide (HYDRODIURIL) 25 MG tablet Take 25 mg by mouth every morning.   loratadine (CLARITIN) 10 MG tablet Take 10 mg by mouth daily as needed for allergies.   metFORMIN (GLUCOPHAGE) 500 MG tablet Take 500 mg by mouth daily with breakfast.   methocarbamol (ROBAXIN) 500 MG tablet Take 1 tablet (500 mg total) by mouth every 6 (six) hours as needed for muscle spasms.   metoprolol tartrate (LOPRESSOR) 100 MG tablet Take 1 tablet by mouth once for procedure.   omeprazole (PRILOSEC) 40 MG capsule Take 1 capsule (40 mg total) by mouth daily.   oxyCODONE-acetaminophen (PERCOCET) 7.5-325 MG tablet Take 1 tablet by mouth 4 (four) times daily as needed for moderate pain.   oxymetazoline (AFRIN) 0.05 % nasal spray Place 1 spray into both nostrils 2 (two) times daily as needed for congestion.   polyethylene glycol (MIRALAX / GLYCOLAX) 17 g packet Take 17 g by mouth daily.   potassium chloride SA (KLOR-CON M) 20 MEQ tablet Take 1 tablet (20 mEq total) by mouth 2 (two) times daily.   pravastatin (PRAVACHOL) 40 MG tablet TAKE 1 TABLET ONCE A DAY   traZODone (DESYREL) 100 MG tablet Take 200 mg by mouth at bedtime.   Current Facility-Administered Medications for the 10/05/22 encounter (Office Visit) with Geralynn Rile, MD  Medication   bupivacaine-meloxicam ER (ZYNRELEF) injection 400 mg      Allergies:    Patient has no allergy information on record.   Social History: Social History   Socioeconomic History   Marital status: Single    Spouse name: Not on file   Number of children: 2   Years of education: Not on file   Highest education level: Not on file  Occupational History   Occupation: Disabled  Tobacco Use   Smoking status: Former    Packs/day: 1.00    Years: 20.00    Additional pack years: 0.00    Total pack years: 20.00    Types: Cigarettes   Smokeless tobacco: Never   Tobacco comments:    trying to quit  Vaping Use  Vaping Use: Some days  Substance and Sexual Activity   Alcohol use: No    Alcohol/week: 0.0 standard drinks of alcohol   Drug use: No   Sexual activity: Not Currently    Birth control/protection: Surgical  Other Topics Concern   Not on file  Social History Narrative   ** Merged History Encounter **       Social Determinants of Health   Financial Resource Strain: Not on file  Food Insecurity: No Food Insecurity (03/16/2022)   Hunger Vital Sign    Worried About Running Out of Food in the Last Year: Never true    Ran Out of Food in the Last Year: Never true  Transportation Needs: No Transportation Needs (03/16/2022)   PRAPARE - Hydrologist (Medical): No    Lack of Transportation (Non-Medical): No  Physical Activity: Not on file  Stress: Not on file  Social Connections: Not on file     Family History: The patient's family history includes Colon cancer in an other family member; Heart disease in her father; Lung cancer in her mother.  ROS:   All other ROS reviewed and negative. Pertinent positives noted in the HPI.     EKGs/Labs/Other Studies Reviewed:   The following studies were personally reviewed by me today:  EKG:  EKG is ordered today.  The ekg ordered today demonstrates normal sinus rhythm heart 73, no acute ischemic changes or evidence of infarction, and was personally reviewed by me.    Recent Labs: 09/20/2022: BUN 6; Creatinine, Ser 0.75; Hemoglobin 10.9; Platelets 341; Potassium 3.3; Sodium 136   Recent Lipid Panel No results found for: "CHOL", "TRIG", "HDL", "CHOLHDL", "VLDL", "LDLCALC", "LDLDIRECT"  Physical Exam:   VS:  BP 136/72   Pulse 73   Ht 5\' 4"  (1.626 m)   Wt 230 lb 12.8 oz (104.7 kg)   SpO2 95%   BMI 39.62 kg/m    Wt Readings from Last 3 Encounters:  10/05/22 230 lb 12.8 oz (104.7 kg)  09/20/22 257 lb 15 oz (117 kg)  03/10/22 257 lb 15 oz (117 kg)    General: Well nourished, well developed, in no acute distress Head: Atraumatic, normal size  Eyes: PEERLA, EOMI  Neck: Supple, no JVD Endocrine: No thryomegaly Cardiac: Normal S1, S2; RRR; no murmurs, rubs, or gallops Lungs: Clear to auscultation bilaterally, no wheezing, rhonchi or rales  Abd: Soft, nontender, no hepatomegaly  Ext: No edema, pulses 2+ Musculoskeletal: No deformities, BUE and BLE strength normal and equal Skin: Warm and dry, no rashes   Neuro: Alert and oriented to person, place, time, and situation, CNII-XII grossly intact, no focal deficits  Psych: Normal mood and affect   ASSESSMENT:   INDY BITNER is a 62 y.o. female who presents for the following: 1. SOB (shortness of breath)   2. Leg edema   3. Precordial pain   4. Iron deficiency anemia, unspecified iron deficiency anemia type     PLAN:   1. SOB (shortness of breath) -Shortness of breath for several years.  Could be related to smoking.  No signs of heart failure.  BNP is normal.  We will check an echo to make sure her heart is structurally normal.  Coronary CTA to evaluate chest discomfort.  EKG is normal which is reassuring. -She is noticeably anemic.  Hemoglobin value 10.9.  Low MCV.  We discussed that this could represent iron deficiency anemia.  This needs to be evaluated by her primary care  physician.  If her heart testing is negative anemia could explain her symptoms.  Would also recommend this be evaluated  prior to any surgical procedure.  2. Leg edema -Likely related to venous insufficiency.  Leg elevation and weight loss recommended.  She is on hydrochlorothiazide.  Needs to be cautious with potassium levels.  Echo as above to exclude heart failure.  Again BNP value was normal.  3. Precordial pain -Intermittent sharp chest discomfort.  Coronary CTA to evaluate.  100 mg metoprolol tartrate.  Recheck BMP today.  4. Iron deficiency anemia, unspecified iron deficiency anemia type -She is anemic.  This could explain her shortness of breath.  MCV is low.  Could represent iron deficiency.  Would recommend evaluation by primary care physician on this.      Disposition: Return if symptoms worsen or fail to improve.  Medication Adjustments/Labs and Tests Ordered: Current medicines are reviewed at length with the patient today.  Concerns regarding medicines are outlined above.  Orders Placed This Encounter  Procedures   CT CORONARY MORPH W/CTA COR W/SCORE W/CA W/CM &/OR WO/CM   Basic metabolic panel   CBC   EKG 12-Lead   ECHOCARDIOGRAM COMPLETE   Meds ordered this encounter  Medications   metoprolol tartrate (LOPRESSOR) 100 MG tablet    Sig: Take 1 tablet by mouth once for procedure.    Dispense:  1 tablet    Refill:  0    Patient Instructions  Medication Instructions:  Take Metoprolol 100 mg two hours before CT when scheduled.   *If you need a refill on your cardiac medications before your next appointment, please call your pharmacy*   Lab Work: BMET, CBC today   If you have labs (blood work) drawn today and your tests are completely normal, you will receive your results only by: [place] (if you have MyChart) OR A paper copy in the mail If you have any lab test that is abnormal or we need to change your treatment, we will call you to review the results.   Testing/Procedures: Coronary CTA- they will call you to schedule.   Echocardiogram - Your physician has  requested that you have an echocardiogram. Echocardiography is a painless test that uses sound waves to create images of your heart. It provides your doctor with information about the size and shape of your heart and how well your heart's chambers and valves are working. This procedure takes approximately one hour. There are no restrictions for this procedure.     Follow-Up: At [facility], you and your health needs are our priority.  As part of our continuing mission to provide you with exceptional heart care, we have created designated Provider Care Teams.  These Care Teams include your primary Cardiologist (physician) and Advanced Practice Providers (APPs -  Physician Assistants and Nurse Practitioners) who all work together to provide you with the care you need, when you need it.  We recommend signing up for the patient portal called "MyChart".  Sign up information is provided on this After Visit Summary.  MyChart is used to connect with patients for Virtual Visits (Telemedicine).  Patients are able to view lab/test results, encounter notes, upcoming appointments, etc.  Non-urgent messages can be sent to your provider as well.   To learn more about what you can do with MyChart, go to [email or web address].    Your next appointment:   As needed based on results.  Provider:   Eleonore Chiquito, MD   Other Instructions  Your cardiac CT will be scheduled at one of the below locations:   [facility] [address] [place], [place] [address] [phone]   If scheduled at [facility], please arrive at the [facility] and Children's Entrance (Entrance C2) of [facility] 30 minutes prior to test start time. You can use the FREE valet parking offered at entrance C (encouraged to control the heart rate for the test)  Proceed to the [facility] Radiology Department (first floor) to check-in and test prep.  All radiology patients and guests should use entrance  C2 at [facility], accessed from [facility], even though the hospital's physical address listed is [address]     Please follow these instructions carefully (unless otherwise directed):   On the Night Before the Test: Be sure to Drink plenty of water. Do not consume any caffeinated/decaffeinated beverages or chocolate 12 hours prior to your test. Do not take any antihistamines 12 hours prior to your test.  On the Day of the Test: Drink plenty of water until 1 hour prior to the test. Do not eat any food 1 hour prior to test. You may take your regular medications prior to the test.  Take metoprolol (Lopressor) two hours prior to test. If you take Furosemide/Hydrochlorothiazide/Spironolactone, please HOLD on the morning of the test. FEMALES- please wear underwire-free bra if available, avoid dresses & tight clothing  After the Test: Drink plenty of water. After receiving IV contrast, you may experience a mild flushed feeling. This is normal. On occasion, you may experience a mild rash up to 24 hours after the test. This is not dangerous. If this occurs, you can take Benadryl 25 mg and increase your fluid intake. If you experience trouble breathing, this can be serious. If it is severe call 911 IMMEDIATELY. If it is mild, please call our office. If you take any of these medications: Glipizide/Metformin, Avandament, Glucavance, please do not take 48 hours after completing test unless otherwise instructed.  We will call to schedule your test 2-4 weeks out understanding that some insurance companies will need an authorization prior to the service being performed.   For non-scheduling related questions, please contact the cardiac imaging nurse navigator should you have any questions/concerns: Marchia Bond, Cardiac Imaging Nurse Navigator Gordy Clement, Cardiac Imaging Nurse Navigator [facility] Direct Office Dial: [phone]    For scheduling needs, including cancellations and rescheduling, please call [place], [phone].     Signed, Addison Naegeli. Audie Box, MD, [place]  [address] [place], [place] [address] [phone]  10/05/2022 9:32 AM

## 2022-10-05 ENCOUNTER — Ambulatory Visit: Payer: 59 | Attending: Cardiovascular Disease | Admitting: Cardiovascular Disease

## 2022-10-05 ENCOUNTER — Encounter: Payer: Self-pay | Admitting: Cardiovascular Disease

## 2022-10-05 VITALS — BP 136/72 | HR 73 | Ht 64.0 in | Wt 230.8 lb

## 2022-10-05 DIAGNOSIS — R600 Localized edema: Secondary | ICD-10-CM

## 2022-10-05 DIAGNOSIS — D509 Iron deficiency anemia, unspecified: Secondary | ICD-10-CM

## 2022-10-05 DIAGNOSIS — R072 Precordial pain: Secondary | ICD-10-CM

## 2022-10-05 DIAGNOSIS — R0602 Shortness of breath: Secondary | ICD-10-CM

## 2022-10-05 MED ORDER — METOPROLOL TARTRATE 100 MG PO TABS: ORAL_TABLET | ORAL | 0 refills | Status: DC

## 2022-10-05 NOTE — Patient Instructions (Addendum)
Medication Instructions:  Take Metoprolol 100 mg two hours before CT when scheduled.   *If you need a refill on your cardiac medications before your next appointment, please call your pharmacy*   Lab Work: BMET, CBC today   If you have labs (blood work) drawn today and your tests are completely normal, you will receive your results only by: [place] (if you have MyChart) OR A paper copy in the mail If you have any lab test that is abnormal or we need to change your treatment, we will call you to review the results.   Testing/Procedures: Coronary CTA- they will call you to schedule.   Echocardiogram - Your physician has requested that you have an echocardiogram. Echocardiography is a painless test that uses sound waves to create images of your heart. It provides your doctor with information about the size and shape of your heart and how well your heart's chambers and valves are working. This procedure takes approximately one hour. There are no restrictions for this procedure.     Follow-Up: At [facility], you and your health needs are our priority.  As part of our continuing mission to provide you with exceptional heart care, we have created designated Provider Care Teams.  These Care Teams include your primary Cardiologist (physician) and Advanced Practice Providers (APPs -  Physician Assistants and Nurse Practitioners) who all work together to provide you with the care you need, when you need it.  We recommend signing up for the patient portal called "MyChart".  Sign up information is provided on this After Visit Summary.  MyChart is used to connect with patients for Virtual Visits (Telemedicine).  Patients are able to view lab/test results, encounter notes, upcoming appointments, etc.  Non-urgent messages can be sent to your provider as well.   To learn more about what you can do with MyChart, go to [email or web address].    Your next appointment:   As needed based  on results.  Provider:   Eleonore Chiquito, MD   Other Instructions   Your cardiac CT will be scheduled at one of the below locations:   [facility] [address] [place], [place] [address] [phone]   If scheduled at [facility], please arrive at the [facility] and Children's Entrance (Entrance C2) of [facility] 30 minutes prior to test start time. You can use the FREE valet parking offered at entrance C (encouraged to control the heart rate for the test)  Proceed to the [facility] Radiology Department (first floor) to check-in and test prep.  All radiology patients and guests should use entrance C2 at [facility], accessed from [facility], even though the hospital's physical address listed is [address].     Please follow these instructions carefully (unless otherwise directed):   On the Night Before the Test: Be sure to Drink plenty of water. Do not consume any caffeinated/decaffeinated beverages or chocolate 12 hours prior to your test. Do not take any antihistamines 12 hours prior to your test.  On the Day of the Test: Drink plenty of water until 1 hour prior to the test. Do not eat any food 1 hour prior to test. You may take your regular medications prior to the test.  Take metoprolol (Lopressor) two hours prior to test. If you take Furosemide/Hydrochlorothiazide/Spironolactone, please HOLD on the morning of the test. FEMALES- please wear underwire-free bra if available, avoid dresses & tight clothing  After the Test: Drink plenty  of water. After receiving IV contrast, you may experience a mild flushed feeling. This is normal. On occasion, you may experience a mild rash up to 24 hours after the test. This is not dangerous. If this occurs, you can take Benadryl 25 mg and increase your fluid intake. If you experience trouble breathing, this can be serious. If it is severe call 911 IMMEDIATELY. If it is mild,  please call our office. If you take any of these medications: Glipizide/Metformin, Avandament, Glucavance, please do not take 48 hours after completing test unless otherwise instructed.  We will call to schedule your test 2-4 weeks out understanding that some insurance companies will need an authorization prior to the service being performed.   For non-scheduling related questions, please contact the cardiac imaging nurse navigator should you have any questions/concerns: Marchia Bond, Cardiac Imaging Nurse Navigator Gordy Clement, Cardiac Imaging Nurse Navigator [place] [facility] Direct Office Dial: [phone]   For scheduling needs, including cancellations and rescheduling, please call [place], [phone].

## 2022-10-06 ENCOUNTER — Encounter: Payer: Self-pay | Admitting: Adult Health

## 2022-10-06 ENCOUNTER — Ambulatory Visit (INDEPENDENT_AMBULATORY_CARE_PROVIDER_SITE_OTHER): Payer: 59 | Admitting: Adult Health

## 2022-10-06 VITALS — BP 127/70 | HR 77 | Ht 62.0 in | Wt 233.0 lb

## 2022-10-06 DIAGNOSIS — R232 Flushing: Secondary | ICD-10-CM

## 2022-10-06 DIAGNOSIS — N3946 Mixed incontinence: Secondary | ICD-10-CM

## 2022-10-06 DIAGNOSIS — Z0000 Encounter for general adult medical examination without abnormal findings: Secondary | ICD-10-CM | POA: Diagnosis not present

## 2022-10-06 DIAGNOSIS — N9089 Other specified noninflammatory disorders of vulva and perineum: Secondary | ICD-10-CM | POA: Insufficient documentation

## 2022-10-06 DIAGNOSIS — Z01419 Encounter for gynecological examination (general) (routine) without abnormal findings: Secondary | ICD-10-CM | POA: Insufficient documentation

## 2022-10-06 DIAGNOSIS — L900 Lichen sclerosus et atrophicus: Secondary | ICD-10-CM | POA: Diagnosis not present

## 2022-10-06 DIAGNOSIS — Z79899 Other long term (current) drug therapy: Secondary | ICD-10-CM

## 2022-10-06 DIAGNOSIS — Z1211 Encounter for screening for malignant neoplasm of colon: Secondary | ICD-10-CM | POA: Diagnosis not present

## 2022-10-06 DIAGNOSIS — Z90721 Acquired absence of ovaries, unilateral: Secondary | ICD-10-CM

## 2022-10-06 DIAGNOSIS — Z90710 Acquired absence of both cervix and uterus: Secondary | ICD-10-CM

## 2022-10-06 LAB — BASIC METABOLIC PANEL
BUN/Creatinine Ratio: 8 — ABNORMAL LOW (ref 12–28)
BUN: 6 mg/dL — ABNORMAL LOW (ref 8–27)
CO2: 24 mmol/L (ref 20–29)
Calcium: 9.6 mg/dL (ref 8.7–10.3)
Chloride: 102 mmol/L (ref 96–106)
Creatinine, Ser: 0.72 mg/dL (ref 0.57–1.00)
Glucose: 101 mg/dL — ABNORMAL HIGH (ref 70–99)
Potassium: 4.7 mmol/L (ref 3.5–5.2)
Sodium: 139 mmol/L (ref 134–144)
eGFR: 96 mL/min/{1.73_m2} (ref >59)

## 2022-10-06 LAB — CBC
Hematocrit: 34.9 % (ref 34.0–46.6)
Hemoglobin: 10.6 g/dL — ABNORMAL LOW (ref 11.1–15.9)
MCH: 23.3 pg — ABNORMAL LOW (ref 26.6–33.0)
MCHC: 30.4 g/dL — ABNORMAL LOW (ref 31.5–35.7)
MCV: 77 fL — ABNORMAL LOW (ref 79–97)
Platelets: 275 10*3/uL (ref 150–450)
RBC: 4.55 x10E6/uL (ref 3.77–5.28)
RDW: 18.1 % — ABNORMAL HIGH (ref 11.7–15.4)
WBC: 8.6 10*3/uL (ref 3.4–10.8)

## 2022-10-06 LAB — HEMOCCULT GUIAC POC 1CARD (OFFICE): Fecal Occult Blood, POC: NEGATIVE

## 2022-10-06 MED ORDER — CLOBETASOL PROPIONATE 0.05 % EX OINT: TOPICAL_OINTMENT | CUTANEOUS | 0 refills | Status: DC

## 2022-10-06 NOTE — Progress Notes (Signed)
Patient ID: [name], female   DOB: 06-24-1961, 62 y.o.   MRN: [id number] History of Present Illness:  [name] is a 62 year old white female,single, sp hysterectomy and BSO years ago, in for pelvic and breast exam. Had physical with PCP. She says feels swollen down there. She said she saw cardiology for [facility] and swelling in her legs yesterday.  PCP is Sharlyne Cai NP  Current Medications, Allergies, Past Medical History, Past Surgical History, Family History and Social History were reviewed in [employer] record.     Review of Systems: Feels swollen "down there" Denies any itching or burning Does lose urine if has to go or coughs or sneezes Has not had sex in years She is on estrace 2 mg and still has some hot flashes  Physical Exam:BP 127/70 (BP Location: Left Arm, Patient Position: Sitting, Cuff Size: Large)   Pulse 77   Ht 5\' 2"  (1.575 m)   Wt 233 lb (105.7 kg)   BMI 42.62 kg/m   General:  Well developed, well nourished, no acute distress Skin:  Warm and dry Lungs; Clear to auscultation bilaterally Breast:  No dominant palpable mass, retraction, or nipple discharge Cardiovascular: Regular rate and rhythm  Pelvic:  External genitalia has 1 cm dark irregular area right vulva near clitoris, Dr Nelda Marseille in for co exam and picture taken and sent to chart. There is thin red skin about clitoris area.  The vagina is pale, has warty area in vagina on right and can feel mesh from bladder sling. Urethra has no lesions or masses. The cervix and uterus are absent. No adnexal masses or tenderness noted.Bladder is non tender, no masses felt. Rectal: Good sphincter tone, no polyps, or hemorrhoids felt.  Hemoccult negative. Extremities/musculoskeletal: +swelling and painful when touched Psych:  No mood changes, alert and cooperative,seems happy AA is 0 Fall risk is moderate    10/06/2022    8:53 AM  Depression screen PHQ 2/9  Decreased Interest 2  Down, Depressed,  Hopeless 0  PHQ - 2 Score 2  Altered sleeping 0  Tired, decreased energy 2  Change in appetite 2  Feeling bad or failure about yourself  0  Trouble concentrating 0  Moving slowly or fidgety/restless 0  Suicidal thoughts 0  PHQ-9 Score 6   She is on meds and sees [facility]    10/06/2022    8:53 AM  GAD 7 : Generalized Anxiety Score  Nervous, Anxious, on Edge 0  Control/stop worrying 2  Worry too much - different things 2  Trouble relaxing 0  Restless 0  Easily annoyed or irritable 0  Afraid - awful might happen 0  Total GAD 7 Score 4      Upstream - 10/06/22 [id number]       Pregnancy Intention Screening   Does the patient want to become pregnant in the next year? N/A    Does the patient's partner want to become pregnant in the next year? N/A    Would the patient like to discuss contraceptive options today? N/A      Contraception Wrap Up   Current Method Female Sterilization   hyst   End Method Female Sterilization   hyst   Contraception Counseling Provided No            Examination chaperoned by Levy Pupa LPN  Impression and Plan: 1. Visit for pelvic exam Physical with PCP Labs with PCP Call PCP and let know you want mammogram at [facility]  Colonoscopy per GI  2. Encounter for screening fecal occult blood testing Hemoccult was negative   3. Lesion of vulva Has 1 cm dark irregular area right vulva Picture taken and placed in chart Will follow up with Dr Nelda Marseille in 3 months for comparison  4. Lichen sclerosus et atrophicus Has area above clitoris, thin and red Will rx temovate Meds ordered this encounter  Medications   clobetasol ointment (TEMOVATE) 0.05 %    Sig: Apply daily to affected area for 2 weeks then 2-3 x weekly    Dispense:  30 g    Refill:  0    Order Specific Question:   Supervising Provider    Answer:   Elonda Husky, LUTHER H [[id number]]     5. Hot flashes Can not increase estrace above 2 mg daily  PCP refills   6. S/P hysterectomy with  oophorectomy  7. Current use of estrogen therapy On Estrace 2 mg daily   8. Mixed stress and urge urinary incontinence Wears pad for leakage Use Aquaphor 3N1 on vulva to protect skin Will refer to urology has bladder sling  - Ambulatory referral to Urology

## 2022-10-11 ENCOUNTER — Telehealth (HOSPITAL_COMMUNITY): Payer: Self-pay | Admitting: *Deleted

## 2022-10-11 NOTE — Telephone Encounter (Signed)
Reaching out to patient to offer assistance regarding upcoming cardiac imaging study; pt verbalizes understanding of appt date/time, parking situation and where to check in, pre-test NPO status and medications ordered, and verified current allergies; name and call back number provided for further questions should they arise  Jovanie Verge RN Navigator Cardiac Imaging [facility] [phone] office [phone] cell  Patient to take 100mg metoprolol tartrate two hours prior to her cardiac CT scan. She is aware to arrive at 7:30am. 

## 2022-10-12 ENCOUNTER — Encounter: Payer: Self-pay | Admitting: *Deleted

## 2022-10-12 ENCOUNTER — Ambulatory Visit (HOSPITAL_COMMUNITY)
Admission: RE | Admit: 2022-10-12 | Discharge: 2022-10-12 | Disposition: A | Discharge status: Home / Self Care | Payer: 59 | Source: Ambulatory Visit | Attending: Cardiovascular Disease | Admitting: Cardiovascular Disease

## 2022-10-12 ENCOUNTER — Other Ambulatory Visit: Payer: Self-pay | Admitting: Cardiology

## 2022-10-12 ENCOUNTER — Ambulatory Visit (HOSPITAL_BASED_OUTPATIENT_CLINIC_OR_DEPARTMENT_OTHER)
Admission: RE | Admit: 2022-10-12 | Discharge: 2022-10-12 | Disposition: A | Discharge status: Home / Self Care | Payer: 59 | Source: Ambulatory Visit | Attending: Cardiology | Admitting: Cardiology

## 2022-10-12 DIAGNOSIS — I2510 Atherosclerotic heart disease of native coronary artery without angina pectoris: Secondary | ICD-10-CM

## 2022-10-12 DIAGNOSIS — R072 Precordial pain: Secondary | ICD-10-CM | POA: Insufficient documentation

## 2022-10-12 DIAGNOSIS — R931 Abnormal findings on diagnostic imaging of heart and coronary circulation: Secondary | ICD-10-CM

## 2022-10-12 MED ORDER — NITROGLYCERIN 0.4 MG SL SUBL
SUBLINGUAL_TABLET | SUBLINGUAL | Status: AC
  Filled 2022-10-12: qty 2

## 2022-10-12 MED ORDER — NITROGLYCERIN 0.4 MG SL SUBL
0.8000 mg | SUBLINGUAL_TABLET | Freq: Once | SUBLINGUAL | Status: AC
  Administered 2022-10-12: 0.8 mg via SUBLINGUAL

## 2022-10-12 MED ORDER — IOHEXOL 350 MG/ML SOLN
95.0000 mL | Freq: Once | INTRAVENOUS | Status: AC | PRN
  Administered 2022-10-12: 95 mL via INTRAVENOUS

## 2022-10-14 ENCOUNTER — Telehealth: Payer: Self-pay | Admitting: Cardiovascular Disease

## 2022-10-14 NOTE — Telephone Encounter (Signed)
Patient returned RN's call. 

## 2022-10-14 NOTE — Telephone Encounter (Signed)
Sande Rives, MD 10/12/2022  9:31 PM EDT     Can we get her scheduled for next week to discuss left heart cath?   Gerri Spore T. Flora Lipps, MD, [facility]  [facility] [address] [place], [place] [address] [phone] 9:31 PM    Spoke with patient regarding the following results. Patient made aware and patient verbalized understanding.  Appointment scheduled for 10/20/22 at 2pm with Dr. Flora Lipps. Patient aware of appointment time and date.   Advised patient to call back to office with any issues, questions, or concerns. Patient verbalized understanding.

## 2022-10-16 NOTE — Progress Notes (Deleted)
Cardiology Office Note:   Date:  10/16/2022  NAME:  [name]    MRN: [id number] DOB:  11-20-1960   PCP:  Rebekah Chesterfield, NP  Cardiologist:  None  Electrophysiologist:  None   Referring MD: Rebekah Chesterfield, NP   No chief complaint on file. ***  History of Present Illness:   [name] is a 62 y.o. female with a hx of anemia, CAD, obesity who presents for follow-up.   Problem List CAD -CAC score 800 (99th percentile) -TPV 856 mm3 (84th percentile) -d RCA 70-99% -LM 30-49% -LAD 50-69% -OM1 50-69% 2. DM -A1c 6.8 3. Anemia  -HGB 10.6 4. HLD  Past Medical History: Past Medical History:  Diagnosis Date   Anxiety    Arthritis    Asthma    Cancer (HCC)    cervical cancer; pt states a long long time ago   Depression    Diabetes mellitus without complication (HCC)    Dyspnea    Edema of all four extremities    Essential (primary) hypertension 06/24/2020   Hyperlipemia    Type II diabetes mellitus (HCC) 03/22/2022    Past Surgical History: Past Surgical History:  Procedure Laterality Date   ABDOMINAL HYSTERECTOMY     ANTERIOR LAT LUMBAR FUSION N/A 11/05/2019   Procedure: Lumbar Four-Five Anterolateral lumbar interbody fusion with lateral plate;  Surgeon: Barnett Abu, MD;  Location: [facility] OR;  Service: Neurosurgery;  Laterality: N/A;  anterolateral   BACK SURGERY     pins   BACK SURGERY  07/11/2008   BIOPSY  06/29/2021   Procedure: BIOPSY;  Surgeon: Dolores Frame, MD;  Location: [facility] ENDO SUITE;  Service: Gastroenterology;;   BLADDER REPAIR     CARPAL TUNNEL RELEASE Right    CESAREAN SECTION  1982, 1984   COLONOSCOPY N/A 06/12/2013   rehman: normal   ESOPHAGOGASTRODUODENOSCOPY (EGD) WITH PROPOFOL N/A 06/29/2021   Procedure: ESOPHAGOGASTRODUODENOSCOPY (EGD) WITH PROPOFOL;  Surgeon: Dolores Frame, MD;  Location: [facility] END[facility] SUITE;  Service: Gastroenterology;  Laterality: N/A;  8:40   KNEE ARTHROSCOPY WITH MEDIAL MENISECTOMY  Right 05/09/2019   Procedure: KNEE ARTHROSCOPY WITH MEDIAL MENISECTOMY AND LATERAL MENISECTOMY;  Surgeon: Vickki Hearing, MD;  Location: [facility] ORS;  Service: Orthopedics;  Laterality: Right;   KNEE ARTHROSCOPY WITH MEDIAL MENISECTOMY Left 06/27/2019   Procedure: KNEE ARTHROSCOPY WITH MEDIAL MENISCECTOMY;  Surgeon: Vickki Hearing, MD;  Location: [facility] ORS;  Service: Orthopedics;  Laterality: Left;   TOTAL KNEE ARTHROPLASTY Left 03/15/2022   Procedure: TOTAL KNEE ARTHROPLASTY;  Surgeon: Vickki Hearing, MD;  Location: [facility] ORS;  Service: Orthopedics;  Laterality: Left;    Current Medications: No outpatient medications have been marked as taking for the 10/20/22 encounter (Appointment) with Sande Rives, MD.   Current Facility-Administered Medications for the 10/20/22 encounter (Appointment) with Sande Rives, MD  Medication   bupivacaine-meloxicam ER (ZYNRELEF) injection 400 mg     Allergies:    Patient has no known allergies.   Social History: Social History   Socioeconomic History   Marital status: Single    Spouse name: Not on file   Number of children: 2   Years of education: Not on file   Highest education level: Not on file  Occupational History   Occupation: Disabled  Tobacco Use   Smoking status: Former    Packs/day: 1.00    Years: 20.00    Additional pack years: 0.00    Total pack years: 20.00    Types:  Cigarettes   Smokeless tobacco: Never   Tobacco comments:    trying to quit  Vaping Use   Vaping Use: Some days  Substance and Sexual Activity   Alcohol use: No    Alcohol/week: 0.0 standard drinks of alcohol   Drug use: No   Sexual activity: Not Currently    Birth control/protection: Surgical    Comment: hyst  Other Topics Concern   Not on file  Social History Narrative   ** Merged History Encounter **       Social Determinants of Health   Financial Resource Strain: Low Risk  (10/06/2022)   Overall Financial Resource Strain (CARDIA)     Difficulty of Paying Living Expenses: Not very hard  Food Insecurity: No Food Insecurity (10/06/2022)   Hunger Vital Sign    Worried About Running Out of Food in the Last Year: Never true    Ran Out of Food in the Last Year: Never true  Transportation Needs: No Transportation Needs (10/06/2022)   PRAPARE - Administrator, Civil Service (Medical): No    Lack of Transportation (Non-Medical): No  Physical Activity: Inactive (10/06/2022)   Exercise Vital Sign    Days of Exercise per Week: 0 days    Minutes of Exercise per Session: 0 min  Stress: No Stress Concern Present (10/06/2022)   [employer] of Occupational Health - Occupational Stress Questionnaire    Feeling of Stress : Not at all  Social Connections: Socially Isolated (10/06/2022)   Social Connection and Isolation Panel [NHANES]    Frequency of Communication with Friends and Family: Once a week    Frequency of Social Gatherings with Friends and Family: Once a week    Attends Religious Services: Never    Database administrator or Organizations: No    Attends Engineer, structural: Never    Marital Status: Divorced     Family History: The patient's ***family history includes Cancer in her maternal grandmother, paternal grandfather, and paternal grandmother; Colon cancer in an other family member; Heart disease in her father; Lung cancer in her mother; Other in her brother.  ROS:   All other ROS reviewed and negative. Pertinent positives noted in the HPI.     EKGs/Labs/Other Studies Reviewed:   The following studies were personally reviewed by me today:  EKG:  EKG is *** ordered today.  The ekg ordered today demonstrates ***, and was personally reviewed by me.   Recent Labs: 10/05/2022: BUN 6; Creatinine, Ser 0.72; Hemoglobin 10.6; Platelets 275; Potassium 4.7; Sodium 139   Recent Lipid Panel No results found for: "CHOL", "TRIG", "HDL", "CHOLHDL", "VLDL", "LDLCALC", "LDLDIRECT"  Physical Exam:    VS:  There were no vitals taken for this visit.   Wt Readings from Last 3 Encounters:  10/06/22 233 lb (105.7 kg)  10/05/22 230 lb 12.8 oz (104.7 kg)  09/20/22 257 lb 15 oz (117 kg)    General: Well nourished, well developed, in no acute distress Head: Atraumatic, normal size  Eyes: PEERLA, EOMI  Neck: Supple, no JVD Endocrine: No thryomegaly Cardiac: Normal S1, S2; RRR; no murmurs, rubs, or gallops Lungs: Clear to auscultation bilaterally, no wheezing, rhonchi or rales  Abd: Soft, nontender, no hepatomegaly  Ext: No edema, pulses 2+ Musculoskeletal: No deformities, BUE and BLE strength normal and equal Skin: Warm and dry, no rashes   Neuro: Alert and oriented to person, place, time, and situation, CNII-XII grossly intact, no focal deficits  Psych: Normal mood and affect  ASSESSMENT:   [name] is a 62 y.o. female who presents for the following: No diagnosis found.  PLAN:   There are no diagnoses linked to this encounter.  {Are you ordering a CV Procedure (e.g. stress test, cath, DCCV, TEE, etc)?   Press F2        :[id number]}  Disposition: No follow-ups on file.  Medication Adjustments/Labs and Tests Ordered: Current medicines are reviewed at length with the patient today.  Concerns regarding medicines are outlined above.  No orders of the defined types were placed in this encounter.  No orders of the defined types were placed in this encounter.   There are no Patient Instructions on file for this visit.   Time Spent with Patient: I have spent a total of *** minutes with patient reviewing hospital notes, telemetry, EKGs, labs and examining the patient as well as establishing an assessment and plan that was discussed with the patient.  > 50% of time was spent in direct patient care.  Signed, Lenna GilfordWesley T. Flora Lipps'Neal, MD, [facility] [place]  [facility]  [address], [address] [place], [place] [id number] [phone]  10/16/2022 8:56 AM

## 2022-10-17 ENCOUNTER — Ambulatory Visit: Payer: 59 | Attending: Cardiovascular Disease | Admitting: Cardiovascular Disease

## 2022-10-17 ENCOUNTER — Telehealth: Payer: Self-pay | Admitting: Cardiovascular Disease

## 2022-10-17 ENCOUNTER — Encounter: Payer: Self-pay | Admitting: Cardiovascular Disease

## 2022-10-17 VITALS — BP 122/54 | HR 72 | Ht 64.0 in | Wt 232.0 lb

## 2022-10-17 DIAGNOSIS — R0602 Shortness of breath: Secondary | ICD-10-CM

## 2022-10-17 DIAGNOSIS — E782 Mixed hyperlipidemia: Secondary | ICD-10-CM | POA: Diagnosis not present

## 2022-10-17 DIAGNOSIS — F17210 Nicotine dependence, cigarettes, uncomplicated: Secondary | ICD-10-CM | POA: Diagnosis not present

## 2022-10-17 DIAGNOSIS — I25110 Atherosclerotic heart disease of native coronary artery with unstable angina pectoris: Secondary | ICD-10-CM

## 2022-10-17 DIAGNOSIS — Z720 Tobacco use: Secondary | ICD-10-CM

## 2022-10-17 MED ORDER — ATORVASTATIN CALCIUM 80 MG PO TABS: 80.0000 mg | ORAL_TABLET | Freq: Every day | ORAL | 3 refills | Status: DC

## 2022-10-17 MED ORDER — METOPROLOL TARTRATE 25 MG PO TABS: 25.0000 mg | ORAL_TABLET | Freq: Two times a day (BID) | ORAL | 3 refills | Status: DC

## 2022-10-17 MED ORDER — NITROGLYCERIN 0.4 MG SL SUBL: 0.4000 mg | SUBLINGUAL_TABLET | SUBLINGUAL | 3 refills | Status: DC | PRN

## 2022-10-17 NOTE — Patient Instructions (Signed)
Medication Instructions:   START Metoprolol Tartrate 25 mg twice daily   START Aspirin 81 mg daily   STOP Pravastatin   START Lipitor 80 mg daily   Take Nitroglyerin as prescribed: NITROGLYCERIN  is a type of vasodilator. It relaxes blood vessels, increasing the blood and oxygen supply to your heart. This medicine is used to relieve chest pain caused by angina.  In an angina attack (CHEST PAIN), you should feel better within 5 minutes after your first dose. Do not swallow whole. Place tablet under your tongue. Sit down when taking this medicine. You can take a dose every 5 minutes up to a total of 3 doses. If you do not feel better or feel worse after 1 dose, call 9-1-1 at once. Do not take more than 3 doses in 15 minutes. Do not take your medicine more often than directed.  *If you need a refill on your cardiac medications before your next appointment, please call your pharmacy*  Labs: CBC, BMET, LIPID today    Testing/Procedures: Your physician has requested that you have a cardiac catheterization. Cardiac catheterization is used to diagnose and/or treat various heart conditions. Doctors may recommend this procedure for a number of different reasons. The most common reason is to evaluate chest pain. Chest pain can be a symptom of coronary artery disease (CAD), and cardiac catheterization can show whether plaque is narrowing or blocking your heart's arteries. This procedure is also used to evaluate the valves, as well as measure the blood flow and oxygen levels in different parts of your heart. For further information please visit [email or web address] Please follow instruction sheet, as given.   Follow-Up: At [facility], you and your health needs are our priority.  As part of our continuing mission to provide you with exceptional heart care, we have created designated Provider Care Teams.  These Care Teams include your primary Cardiologist (physician) and Advanced Practice  Providers (APPs -  Physician Assistants and Nurse Practitioners) who all work together to provide you with the care you need, when you need it.  We recommend signing up for the patient portal called "MyChart".  Sign up information is provided on this After Visit Summary.  MyChart is used to connect with patients for Virtual Visits (Telemedicine).  Patients are able to view lab/test results, encounter notes, upcoming appointments, etc.  Non-urgent messages can be sent to your provider as well.   To learn more about what you can do with MyChart, go to [email or web address].    Your next appointment:   4 week(s)  Provider:   Lennie Odor, MD  Other Instructions       Cardiac/Peripheral Catheterization   You are scheduled for a Cardiac Catheterization on Thursday, April 11th with Dr. Alverda Skeans.  1. Please arrive at the Main Entrance A at [facility]: [address] [place], [place] [address] on April 11 at 8:30 AM (This time is two hours before your procedure to ensure your preparation). Free valet parking service is available. You will check in at ADMITTING. The support person will be asked to wait in the waiting room.  It is OK to have someone drop you off and come back when you are ready to be discharged.        Special note: Every effort is made to have your procedure done on time. Please understand that emergencies sometimes delay scheduled procedures.   . 2. Diet: Do not eat solid foods after midnight.  You may have clear liquids  until 5 AM the day of the procedure.  3. Labs: You will have blood drawn today: CBC, BMET, LIPID  4. Medication instructions in preparation for your procedure:   Contrast Allergy: No   Do not take Diabetes Med Glucophage (Metformin) on the day of the procedure and HOLD 48 HOURS AFTER THE PROCEDURE.  On the morning of your procedure, take Aspirin 81 mg and any morning medicines NOT listed above.  You may use sips of water.  5. Plan to  go home the same day, you will only stay overnight if medically necessary. 6. You MUST have a responsible adult to drive you home. 7. An adult MUST be with you the first 24 hours after you arrive home. 8. Bring a current list of your medications, and the last time and date medication taken. 9. Bring ID and current insurance cards. 10.Please wear clothes that are easy to get on and off and wear slip-on shoes.  Thank you for allowing [place] to care for you!   -- [place]

## 2022-10-17 NOTE — Telephone Encounter (Signed)
Pt c/o of Chest Pain: STAT if CP now or developed within 24 hours  1. Are you having CP right now? yes  2. Are you experiencing any other symptoms (ex. SOB, nausea, vomiting, sweating)? No   3. How long have you been experiencing CP? States she has been having chest pain for two days.   4. Is your CP continuous or coming and going? Pain comes and goes  5. Have you taken Nitroglycerin? No, states she doesn't have it.

## 2022-10-17 NOTE — Telephone Encounter (Signed)
Patient seen today- CATH scheduled.  Thanks!

## 2022-10-17 NOTE — H&P (View-Only) (Signed)
Cardiology Office Note:   Date:  10/17/2022  NAME:  [name]    MRN: [id number] DOB:  Apr 30, 1961   PCP:  Rebekah Chesterfield, NP  Cardiologist:  None  Electrophysiologist:  None   Referring MD: Rebekah Chesterfield, NP   Chief Complaint  Patient presents with   Chest Pain   History of Present Illness:   [name] is a 62 y.o. female with a hx of CAD who presents for follow-up.  She reports she still getting sharp chest discomfort.  Described it as right-sided.  Occurs randomly.  Lasts up to 15 minutes.  Not associated with activity.  Not alleviated by cessation of activity.  She is still smoking.  We discussed she must quit.  We discussed the results of her coronary CTA.  Concerning for triple-vessel disease.  A little bit difficult to delineate if there is actually specific lesions to stent.  I believe coronary angiography is warranted.  She is still short of breath with activity.  Will get a good idea of filling pressures in the time of heart cath.  She was evaluated by her primary care physician.  Fecal occult negative.  Anemia will be worked up later.  Problem List CAD -severe dRCA -moderate pLAD -moderate LCX -CAC score 800 (99th percentile) -TPV 856 mm3 (calcified 231 mm3; non-calcified plaque 625 mm3) DM -A1c 6.8 3. HLD 4. Tobacco abuse   Past Medical History: Past Medical History:  Diagnosis Date   Anxiety    Arthritis    Asthma    Cancer    cervical cancer; pt states a long long time ago   Depression    Diabetes mellitus without complication    Dyspnea    Edema of all four extremities    Essential (primary) hypertension 06/24/2020   Hyperlipemia    Type II diabetes mellitus 03/22/2022    Past Surgical History: Past Surgical History:  Procedure Laterality Date   ABDOMINAL HYSTERECTOMY     ANTERIOR LAT LUMBAR FUSION N/A 11/05/2019   Procedure: Lumbar Four-Five Anterolateral lumbar interbody fusion with lateral plate;  Surgeon: Barnett Abu,  MD;  Location: [facility];  Service: Neurosurgery;  Laterality: N/A;  anterolateral   BACK SURGERY     pins   BACK SURGERY  07/11/2008   BIOPSY  06/29/2021   Procedure: BIOPSY;  Surgeon: Dolores Frame, MD;  Location: [facility];  Service: Gastroenterology;;   BLADDER REPAIR     CARPAL TUNNEL RELEASE Right    CESAREAN SECTION  1982, 1984   COLONOSCOPY N/A 06/12/2013   rehman: normal   ESOPHAGOGASTRODUODENOSCOPY (EGD) WITH PROPOFOL N/A 06/29/2021   Procedure: ESOPHAGOGASTRODUODENOSCOPY (EGD) WITH PROPOFOL;  Surgeon: Dolores Frame, MD;  Location: [facility];  Service: Gastroenterology;  Laterality: N/A;  8:40   KNEE ARTHROSCOPY WITH MEDIAL MENISECTOMY Right 05/09/2019   Procedure: KNEE ARTHROSCOPY WITH MEDIAL MENISECTOMY AND LATERAL MENISECTOMY;  Surgeon: Vickki Hearing, MD;  Location: [facility];  Service: Orthopedics;  Laterality: Right;   KNEE ARTHROSCOPY WITH MEDIAL MENISECTOMY Left 06/27/2019   Procedure: KNEE ARTHROSCOPY WITH MEDIAL MENISCECTOMY;  Surgeon: Vickki Hearing, MD;  Location: [facility];  Service: Orthopedics;  Laterality: Left;   TOTAL KNEE ARTHROPLASTY Left 03/15/2022   Procedure: TOTAL KNEE ARTHROPLASTY;  Surgeon: Vickki Hearing, MD;  Location: [facility];  Service: Orthopedics;  Laterality: Left;    Current Medications: Current Meds  Medication Sig   atorvastatin (LIPITOR) 80 MG tablet Take 1 tablet (80 mg total) by  mouth daily.   metoprolol tartrate (LOPRESSOR) 25 MG tablet Take 1 tablet (25 mg total) by mouth 2 (two) times daily.   nitroGLYCERIN (NITROSTAT) 0.4 MG SL tablet Place 1 tablet (0.4 mg total) under the tongue every 5 (five) minutes as needed for chest pain.   Current Facility-Administered Medications for the 10/17/22 encounter (Office Visit) with Sande Rives'Neal, [place], MD  Medication   bupivacaine-meloxicam ER (ZYNRELEF) injection 400 mg     Allergies:    Patient has no known allergies.   Social History: Social History    Socioeconomic History   Marital status: Single    Spouse name: Not on file   Number of children: 2   Years of education: Not on file   Highest education level: Not on file  Occupational History   Occupation: Disabled  Tobacco Use   Smoking status: Former    Packs/day: 1.00    Years: 20.00    Additional pack years: 0.00    Total pack years: 20.00    Types: Cigarettes   Smokeless tobacco: Never   Tobacco comments:    trying to quit  Vaping Use   Vaping Use: Some days  Substance and Sexual Activity   Alcohol use: No    Alcohol/week: 0.0 standard drinks of alcohol   Drug use: No   Sexual activity: Not Currently    Birth control/protection: Surgical    Comment: hyst  Other Topics Concern   Not on file  Social History Narrative   ** Merged History Encounter **       Social Determinants of Health   Financial Resource Strain: Low Risk  (10/06/2022)   Overall Financial Resource Strain (CARDIA)    Difficulty of Paying Living Expenses: Not very hard  Food Insecurity: No Food Insecurity (10/06/2022)   Hunger Vital Sign    Worried About Running Out of Food in the Last Year: Never true    Ran Out of Food in the Last Year: Never true  Transportation Needs: No Transportation Needs (10/06/2022)   PRAPARE - Administrator, Civil ServiceTransportation    Lack of Transportation (Medical): No    Lack of Transportation (Non-Medical): No  Physical Activity: Inactive (10/06/2022)   Exercise Vital Sign    Days of Exercise per Week: 0 days    Minutes of Exercise per Session: 0 min  Stress: No Stress Concern Present (10/06/2022)   [employer] - Occupational Stress Questionnaire    Feeling of Stress : Not at all  Social Connections: Socially Isolated (10/06/2022)   Social Connection and Isolation Panel [NHANES]    Frequency of Communication with Friends and Family: Once a week    Frequency of Social Gatherings with Friends and Family: Once a week    Attends Religious Services: Never     Database administratorActive Member of Clubs or Organizations: No    Attends Engineer, structuralClub or Organization Meetings: Never    Marital Status: Divorced     Family History: The patient's family history includes Cancer in her maternal grandmother, paternal grandfather, and paternal grandmother; Colon cancer in an other family member; Heart disease in her father; Lung cancer in her mother; Other in her brother.  ROS:   All other ROS reviewed and negative. Pertinent positives noted in the HPI.     EKGs/Labs/Other Studies Reviewed:   The following studies were personally reviewed by me today:  EKG:  EKG is  ordered today.  The ekg ordered today demonstrates normal sinus rhythm heart rate 72, no acute ischemic changes or  evidence of infarction, and was personally reviewed by me.   FFR CT 10/12/2022  1. Left Main: Proximal 0.97   2. LAD: Proximal 0.87, mid 0.78, distal 0.65   3. LCX: Proximal 0.90, mid, 0.83, distal 0.75   4. Ramus: N/a   5. RCA: Proximal 0.97, mid 0.89, distal 0.73  CCTA 10/12/2022 IMPRESSION: 1. Coronary calcium score of 800. This was 5 percentile for age and sex matched control.   2. Total plaque volume (TPV) 856 mm3 which is 84 percentile for age-and sex matched controls (calcified plaque 231 mm3; non-calcified plaque 625 mm3). TPV is extensive.   3. Normal coronary origin with right dominance.   4. Diffuse CAD, moderate left main, moderate LAD and D1, moderate to severe RCA distally, moderate circumflex. Cumulative vessel FFR was abnormal.   5. Mitral annular calcification  Recent Labs: 10/05/2022: BUN 6; Creatinine, Ser 0.72; Hemoglobin 10.6; Platelets 275; Potassium 4.7; Sodium 139   Recent Lipid Panel No results found for: "CHOL", "TRIG", "HDL", "CHOLHDL", "VLDL", "LDLCALC", "LDLDIRECT"  Physical Exam:   VS:  BP (!) 122/54 (BP Location: Right Arm, Patient Position: Sitting, Cuff Size: Large)   Pulse 72   Ht 5\' 4"  (1.626 m)   Wt 232 lb (105.2 kg)   BMI 39.82 kg/m    Wt Readings  from Last 3 Encounters:  10/17/22 232 lb (105.2 kg)  10/06/22 233 lb (105.7 kg)  10/05/22 230 lb 12.8 oz (104.7 kg)    General: Well nourished, well developed, in no acute distress Head: Atraumatic, normal size  Eyes: PEERLA, EOMI  Neck: Supple, no JVD Endocrine: No thryomegaly Cardiac: Normal S1, S2; RRR; no murmurs, rubs, or gallops Lungs: Clear to auscultation bilaterally, no wheezing, rhonchi or rales  Abd: Soft, nontender, no hepatomegaly  Ext: No edema, pulses 2+ Musculoskeletal: No deformities, BUE and BLE strength normal and equal Skin: Warm and dry, no rashes   Neuro: Alert and oriented to person, place, time, and situation, CNII-XII grossly intact, no focal deficits  Psych: Normal mood and affect   ASSESSMENT:   [name] is a 62 y.o. female who presents for the following: 1. SOB (shortness of breath)   2. Coronary artery disease involving native coronary artery of native heart with unstable angina pectoris   3. Mixed hyperlipidemia   4. Tobacco abuse     PLAN:   1. SOB (shortness of breath) 2. Coronary artery disease involving native coronary artery of native heart with unstable angina pectoris 3. Mixed hyperlipidemia 4. Tobacco abuse -Coronary CTA with concerns for three-vessel CAD.  Unclear if this is just tapering artifact versus actually obstructive disease.  I reviewed her coronary CTA.  She may have lesions amenable to PCI of the circumflex and distal RCA.  She is now having ongoing symptoms of chest discomfort which occur at rest.  Symptoms could represent unstable angina.  Continued shortness of breath with activity.  I have recommended left heart catheterization to define her coronary anatomy.  She is okay to do this.  Risk and benefits were explained.  She is willing to proceed. -She will start aspirin 81 mg daily.  Stop pravastatin.  Start Lipitor 80 mg daily.  Start metoprolol tartrate 25 mg twice daily.  We will give her prescription for nitroglycerin.   We discussed she should take this if she has chest discomfort.  She should wait 3 to 5 minutes.  If does not resolve she is to take another dose.  If she takes more than 3  doses in a 15-minute period should refer herself to the emergency room. -Left heart catheterization this Thursday.  Follow-up in 2 to 4 weeks with me after this. -Smoking cessation was advised. 3 minutes of smoking cessation counseling was provided. The relationship between smoking and CAD was explained.    Shared Decision Making/Informed Consent The risks [stroke (1 in 1000), death (1 in 1000), kidney failure [usually temporary] (1 in 500), bleeding (1 in 200), allergic reaction [possibly serious] (1 in 200)], benefits (diagnostic support and management of coronary artery disease) and alternatives of a cardiac catheterization were discussed in detail with [name] and she is willing to proceed.  Disposition: Return in about 4 weeks (around 11/14/2022).  Medication Adjustments/Labs and Tests Ordered: Current medicines are reviewed at length with the patient today.  Concerns regarding medicines are outlined above.  Orders Placed This Encounter  Procedures   CBC   Basic metabolic panel   Lipid panel   EKG 12-Lead   Meds ordered this encounter  Medications   nitroGLYCERIN (NITROSTAT) 0.4 MG SL tablet    Sig: Place 1 tablet (0.4 mg total) under the tongue every 5 (five) minutes as needed for chest pain.    Dispense:  25 tablet    Refill:  3   metoprolol tartrate (LOPRESSOR) 25 MG tablet    Sig: Take 1 tablet (25 mg total) by mouth 2 (two) times daily.    Dispense:  180 tablet    Refill:  3   atorvastatin (LIPITOR) 80 MG tablet    Sig: Take 1 tablet (80 mg total) by mouth daily.    Dispense:  90 tablet    Refill:  3    Patient Instructions  Medication Instructions:   START Metoprolol Tartrate 25 mg twice daily   START Aspirin 81 mg daily   STOP Pravastatin   START Lipitor 80 mg daily   Take Nitroglyerin  as prescribed: NITROGLYCERIN  is a type of vasodilator. It relaxes blood vessels, increasing the blood and oxygen supply to your heart. This medicine is used to relieve chest pain caused by angina.  In an angina attack (CHEST PAIN), you should feel better within 5 minutes after your first dose. Do not swallow whole. Place tablet under your tongue. Sit down when taking this medicine. You can take a dose every 5 minutes up to a total of 3 doses. If you do not feel better or feel worse after 1 dose, call 9-1-1 at once. Do not take more than 3 doses in 15 minutes. Do not take your medicine more often than directed.  *If you need a refill on your cardiac medications before your next appointment, please call your pharmacy*  Labs: CBC, BMET, LIPID today    Testing/Procedures: Your physician has requested that you have a cardiac catheterization. Cardiac catheterization is used to diagnose and/or treat various heart conditions. Doctors may recommend this procedure for a number of different reasons. The most common reason is to evaluate chest pain. Chest pain can be a symptom of coronary artery disease (CAD), and cardiac catheterization can show whether plaque is narrowing or blocking your heart's arteries. This procedure is also used to evaluate the valves, as well as measure the blood flow and oxygen levels in different parts of your heart. For further information please visit [email or web address] Please follow instruction sheet, as given.   Follow-Up: At [facility], you and your health needs are our priority.  As part of our continuing mission to provide you with  exceptional heart care, we have created designated Provider Care Teams.  These Care Teams include your primary Cardiologist (physician) and Advanced Practice Providers (APPs -  Physician Assistants and Nurse Practitioners) who all work together to provide you with the care you need, when you need it.  We recommend signing up for the  patient portal called "MyChart".  Sign up information is provided on this After Visit Summary.  MyChart is used to connect with patients for Virtual Visits (Telemedicine).  Patients are able to view lab/test results, encounter notes, upcoming appointments, etc.  Non-urgent messages can be sent to your provider as well.   To learn more about what you can do with MyChart, go to [email or web address].    Your next appointment:   4 week(s)  Provider:   Lennie Odor, MD  Other Instructions       Cardiac/Peripheral Catheterization   You are scheduled for a Cardiac Catheterization on Thursday, April 11th with Dr. Alverda Skeans.  1. Please arrive at the Main Entrance A at [facility]: [address] [place], [place] [address] on April 11 at 8:30 AM (This time is two hours before your procedure to ensure your preparation). Free valet parking service is available. You will check in at ADMITTING. The support person will be asked to wait in the waiting room.  It is OK to have someone drop you off and come back when you are ready to be discharged.        Special note: Every effort is made to have your procedure done on time. Please understand that emergencies sometimes delay scheduled procedures.   . 2. Diet: Do not eat solid foods after midnight.  You may have clear liquids until 5 AM the day of the procedure.  3. Labs: You will have blood drawn today: CBC, BMET, LIPID  4. Medication instructions in preparation for your procedure:   Contrast Allergy: No   Do not take Diabetes Med Glucophage (Metformin) on the day of the procedure and HOLD 48 HOURS AFTER THE PROCEDURE.  On the morning of your procedure, take Aspirin 81 mg and any morning medicines NOT listed above.  You may use sips of water.  5. Plan to go home the same day, you will only stay overnight if medically necessary. 6. You MUST have a responsible adult to drive you home. 7. An adult MUST be with you the first 24 hours  after you arrive home. 8. Bring a current list of your medications, and the last time and date medication taken. 9. Bring ID and current insurance cards. 10.Please wear clothes that are easy to get on and off and wear slip-on shoes.  Thank you for allowing [place] to care for you!   -- [facility]     Time Spent with Patient: I have spent a total of 35 minutes with patient reviewing hospital notes, telemetry, EKGs, labs and examining the patient as well as establishing an assessment and plan that was discussed with the patient.  > 50% of time was spent in direct patient care.  Signed, Lenna Gilford. Flora Lipps, MD, [facility]  [facility]  [address], [address] [place], [place] [address] [phone]  10/17/2022 1:46 PM

## 2022-10-17 NOTE — Telephone Encounter (Signed)
Patient complaint of chest pain.  States it is intermittent and it is "sharp"   above Left breast.  Happens approx. 3 times in a day,. And has been for the last 2 days.  She states her left arm feels tingly all the time but no pain ,nor radiating to jaw.  She states it is the same pain she had when she came into her visit on 3/27. She has had her CT completed but echo is April 25.  She ask should she be seen sooner than her appt., she is scheduled for April 11. Advised since the pain has not changed and no new symptoms then for now we will keep the appt. But will send this to the doctor for review for any change or recommendation.

## 2022-10-17 NOTE — Progress Notes (Addendum)
Cardiology Office Note:   Date:  10/17/2022  NAME:  [name]    MRN: [id number] DOB:  03/14/1961   PCP:  Dickey, Kirkland M, NP  Cardiologist:  None  Electrophysiologist:  None   Referring MD: Dickey, Kirkland M, NP   Chief Complaint  Patient presents with   Chest Pain   History of Present Illness:   [name] is a 62 y.o. female with a hx of CAD who presents for follow-up.  She reports she still getting sharp chest discomfort.  Described it as right-sided.  Occurs randomly.  Lasts up to 15 minutes.  Not associated with activity.  Not alleviated by cessation of activity.  She is still smoking.  We discussed she must quit.  We discussed the results of her coronary CTA.  Concerning for triple-vessel disease.  A little bit difficult to delineate if there is actually specific lesions to stent.  I believe coronary angiography is warranted.  She is still short of breath with activity.  Will get a good idea of filling pressures in the time of heart cath.  She was evaluated by her primary care physician.  Fecal occult negative.  Anemia will be worked up later.  Problem List CAD -severe dRCA -moderate pLAD -moderate LCX -CAC score 800 (99th percentile) -TPV 856 mm3 (calcified 231 mm3; non-calcified plaque 625 mm3) DM -A1c 6.8 3. HLD 4. Tobacco abuse   Past Medical History: Past Medical History:  Diagnosis Date   Anxiety    Arthritis    Asthma    Cancer    cervical cancer; pt states a long long time ago   Depression    Diabetes mellitus without complication    Dyspnea    Edema of all four extremities    Essential (primary) hypertension 06/24/2020   Hyperlipemia    Type II diabetes mellitus 03/22/2022    Past Surgical History: Past Surgical History:  Procedure Laterality Date   ABDOMINAL HYSTERECTOMY     ANTERIOR LAT LUMBAR FUSION N/A 11/05/2019   Procedure: Lumbar Four-Five Anterolateral lumbar interbody fusion with lateral plate;  Surgeon: Elsner, Henry,  MD;  Location: [facility];  Service: Neurosurgery;  Laterality: N/A;  anterolateral   BACK SURGERY     pins   BACK SURGERY  07/11/2008   BIOPSY  06/29/2021   Procedure: BIOPSY;  Surgeon: Castaneda Mayorga, Daniel, MD;  Location: [facility];  Service: Gastroenterology;;   BLADDER REPAIR     CARPAL TUNNEL RELEASE Right    CESAREAN SECTION  1982, 1984   COLONOSCOPY N/A 06/12/2013   rehman: normal   ESOPHAGOGASTRODUODENOSCOPY (EGD) WITH PROPOFOL N/A 06/29/2021   Procedure: ESOPHAGOGASTRODUODENOSCOPY (EGD) WITH PROPOFOL;  Surgeon: Castaneda Mayorga, Daniel, MD;  Location: [facility];  Service: Gastroenterology;  Laterality: N/A;  8:40   KNEE ARTHROSCOPY WITH MEDIAL MENISECTOMY Right 05/09/2019   Procedure: KNEE ARTHROSCOPY WITH MEDIAL MENISECTOMY AND LATERAL MENISECTOMY;  Surgeon: Harrison, Stanley E, MD;  Location: [facility];  Service: Orthopedics;  Laterality: Right;   KNEE ARTHROSCOPY WITH MEDIAL MENISECTOMY Left 06/27/2019   Procedure: KNEE ARTHROSCOPY WITH MEDIAL MENISCECTOMY;  Surgeon: Harrison, Stanley E, MD;  Location: [facility];  Service: Orthopedics;  Laterality: Left;   TOTAL KNEE ARTHROPLASTY Left 03/15/2022   Procedure: TOTAL KNEE ARTHROPLASTY;  Surgeon: Harrison, Stanley E, MD;  Location: [facility];  Service: Orthopedics;  Laterality: Left;    Current Medications: Current Meds  Medication Sig   atorvastatin (LIPITOR) 80 MG tablet Take 1 tablet (80 mg total) by   mouth daily.   metoprolol tartrate (LOPRESSOR) 25 MG tablet Take 1 tablet (25 mg total) by mouth 2 (two) times daily.   nitroGLYCERIN (NITROSTAT) 0.4 MG SL tablet Place 1 tablet (0.4 mg total) under the tongue every 5 (five) minutes as needed for chest pain.   Current Facility-Administered Medications for the 10/17/22 encounter (Office Visit) with O'Neal, [place], MD  Medication   bupivacaine-meloxicam ER (ZYNRELEF) injection 400 mg     Allergies:    Patient has no known allergies.   Social History: Social History    Socioeconomic History   Marital status: Single    Spouse name: Not on file   Number of children: 2   Years of education: Not on file   Highest education level: Not on file  Occupational History   Occupation: Disabled  Tobacco Use   Smoking status: Former    Packs/day: 1.00    Years: 20.00    Additional pack years: 0.00    Total pack years: 20.00    Types: Cigarettes   Smokeless tobacco: Never   Tobacco comments:    trying to quit  Vaping Use   Vaping Use: Some days  Substance and Sexual Activity   Alcohol use: No    Alcohol/week: 0.0 standard drinks of alcohol   Drug use: No   Sexual activity: Not Currently    Birth control/protection: Surgical    Comment: hyst  Other Topics Concern   Not on file  Social History Narrative   ** Merged History Encounter **       Social Determinants of Health   Financial Resource Strain: Low Risk  (10/06/2022)   Overall Financial Resource Strain (CARDIA)    Difficulty of Paying Living Expenses: Not very hard  Food Insecurity: No Food Insecurity (10/06/2022)   Hunger Vital Sign    Worried About Running Out of Food in the Last Year: Never true    Ran Out of Food in the Last Year: Never true  Transportation Needs: No Transportation Needs (10/06/2022)   PRAPARE - Transportation    Lack of Transportation (Medical): No    Lack of Transportation (Non-Medical): No  Physical Activity: Inactive (10/06/2022)   Exercise Vital Sign    Days of Exercise per Week: 0 days    Minutes of Exercise per Session: 0 min  Stress: No Stress Concern Present (10/06/2022)   [employer] - Occupational Stress Questionnaire    Feeling of Stress : Not at all  Social Connections: Socially Isolated (10/06/2022)   Social Connection and Isolation Panel [NHANES]    Frequency of Communication with Friends and Family: Once a week    Frequency of Social Gatherings with Friends and Family: Once a week    Attends Religious Services: Never     Active Member of Clubs or Organizations: No    Attends Club or Organization Meetings: Never    Marital Status: Divorced     Family History: The patient's family history includes Cancer in her maternal grandmother, paternal grandfather, and paternal grandmother; Colon cancer in an other family member; Heart disease in her father; Lung cancer in her mother; Other in her brother.  ROS:   All other ROS reviewed and negative. Pertinent positives noted in the HPI.     EKGs/Labs/Other Studies Reviewed:   The following studies were personally reviewed by me today:  EKG:  EKG is  ordered today.  The ekg ordered today demonstrates normal sinus rhythm heart rate 72, no acute ischemic changes or   evidence of infarction, and was personally reviewed by me.   FFR CT 10/12/2022  1. Left Main: Proximal 0.97   2. LAD: Proximal 0.87, mid 0.78, distal 0.65   3. LCX: Proximal 0.90, mid, 0.83, distal 0.75   4. Ramus: N/a   5. RCA: Proximal 0.97, mid 0.89, distal 0.73  CCTA 10/12/2022 IMPRESSION: 1. Coronary calcium score of 800. This was 99 percentile for age and sex matched control.   2. Total plaque volume (TPV) 856 mm3 which is 84 percentile for age-and sex matched controls (calcified plaque 231 mm3; non-calcified plaque 625 mm3). TPV is extensive.   3. Normal coronary origin with right dominance.   4. Diffuse CAD, moderate left main, moderate LAD and D1, moderate to severe RCA distally, moderate circumflex. Cumulative vessel FFR was abnormal.   5. Mitral annular calcification  Recent Labs: 10/05/2022: BUN 6; Creatinine, Ser 0.72; Hemoglobin 10.6; Platelets 275; Potassium 4.7; Sodium 139   Recent Lipid Panel No results found for: "CHOL", "TRIG", "HDL", "CHOLHDL", "VLDL", "LDLCALC", "LDLDIRECT"  Physical Exam:   VS:  BP (!) 122/54 (BP Location: Right Arm, Patient Position: Sitting, Cuff Size: Large)   Pulse 72   Ht 5' 4" (1.626 m)   Wt 232 lb (105.2 kg)   BMI 39.82 kg/m    Wt Readings  from Last 3 Encounters:  10/17/22 232 lb (105.2 kg)  10/06/22 233 lb (105.7 kg)  10/05/22 230 lb 12.8 oz (104.7 kg)    General: Well nourished, well developed, in no acute distress Head: Atraumatic, normal size  Eyes: PEERLA, EOMI  Neck: Supple, no JVD Endocrine: No thryomegaly Cardiac: Normal S1, S2; RRR; no murmurs, rubs, or gallops Lungs: Clear to auscultation bilaterally, no wheezing, rhonchi or rales  Abd: Soft, nontender, no hepatomegaly  Ext: No edema, pulses 2+ Musculoskeletal: No deformities, BUE and BLE strength normal and equal Skin: Warm and dry, no rashes   Neuro: Alert and oriented to person, place, time, and situation, CNII-XII grossly intact, no focal deficits  Psych: Normal mood and affect   ASSESSMENT:   [name] is a 61 y.o. female who presents for the following: 1. SOB (shortness of breath)   2. Coronary artery disease involving native coronary artery of native heart with unstable angina pectoris   3. Mixed hyperlipidemia   4. Tobacco abuse     PLAN:   1. SOB (shortness of breath) 2. Coronary artery disease involving native coronary artery of native heart with unstable angina pectoris 3. Mixed hyperlipidemia 4. Tobacco abuse -Coronary CTA with concerns for three-vessel CAD.  Unclear if this is just tapering artifact versus actually obstructive disease.  I reviewed her coronary CTA.  She may have lesions amenable to PCI of the circumflex and distal RCA.  She is now having ongoing symptoms of chest discomfort which occur at rest.  Symptoms could represent unstable angina.  Continued shortness of breath with activity.  I have recommended left heart catheterization to define her coronary anatomy.  She is okay to do this.  Risk and benefits were explained.  She is willing to proceed. -She will start aspirin 81 mg daily.  Stop pravastatin.  Start Lipitor 80 mg daily.  Start metoprolol tartrate 25 mg twice daily.  We will give her prescription for nitroglycerin.   We discussed she should take this if she has chest discomfort.  She should wait 3 to 5 minutes.  If does not resolve she is to take another dose.  If she takes more than 3   doses in a 15-minute period should refer herself to the emergency room. -Left heart catheterization this Thursday.  Follow-up in 2 to 4 weeks with me after this. -Smoking cessation was advised. 3 minutes of smoking cessation counseling was provided. The relationship between smoking and CAD was explained.    Shared Decision Making/Informed Consent The risks [stroke (1 in 1000), death (1 in 1000), kidney failure [usually temporary] (1 in 500), bleeding (1 in 200), allergic reaction [possibly serious] (1 in 200)], benefits (diagnostic support and management of coronary artery disease) and alternatives of a cardiac catheterization were discussed in detail with Ms. Nowakowski and she is willing to proceed.  Disposition: Return in about 4 weeks (around 11/14/2022).  Medication Adjustments/Labs and Tests Ordered: Current medicines are reviewed at length with the patient today.  Concerns regarding medicines are outlined above.  Orders Placed This Encounter  Procedures   CBC   Basic metabolic panel   Lipid panel   EKG 12-Lead   Meds ordered this encounter  Medications   nitroGLYCERIN (NITROSTAT) 0.4 MG SL tablet    Sig: Place 1 tablet (0.4 mg total) under the tongue every 5 (five) minutes as needed for chest pain.    Dispense:  25 tablet    Refill:  3   metoprolol tartrate (LOPRESSOR) 25 MG tablet    Sig: Take 1 tablet (25 mg total) by mouth 2 (two) times daily.    Dispense:  180 tablet    Refill:  3   atorvastatin (LIPITOR) 80 MG tablet    Sig: Take 1 tablet (80 mg total) by mouth daily.    Dispense:  90 tablet    Refill:  3    Patient Instructions  Medication Instructions:   START Metoprolol Tartrate 25 mg twice daily   START Aspirin 81 mg daily   STOP Pravastatin   START Lipitor 80 mg daily   Take Nitroglyerin  as prescribed: NITROGLYCERIN  is a type of vasodilator. It relaxes blood vessels, increasing the blood and oxygen supply to your heart. This medicine is used to relieve chest pain caused by angina.  In an angina attack (CHEST PAIN), you should feel better within 5 minutes after your first dose. Do not swallow whole. Place tablet under your tongue. Sit down when taking this medicine. You can take a dose every 5 minutes up to a total of 3 doses. If you do not feel better or feel worse after 1 dose, call 9-1-1 at once. Do not take more than 3 doses in 15 minutes. Do not take your medicine more often than directed.  *If you need a refill on your cardiac medications before your next appointment, please call your pharmacy*  Labs: CBC, BMET, LIPID today    Testing/Procedures: Your physician has requested that you have a cardiac catheterization. Cardiac catheterization is used to diagnose and/or treat various heart conditions. Doctors may recommend this procedure for a number of different reasons. The most common reason is to evaluate chest pain. Chest pain can be a symptom of coronary artery disease (CAD), and cardiac catheterization can show whether plaque is narrowing or blocking your heart's arteries. This procedure is also used to evaluate the valves, as well as measure the blood flow and oxygen levels in different parts of your heart. For further information please visit www.cardiosmart.org. Please follow instruction sheet, as given.   Follow-Up: At [facility], you and your health needs are our priority.  As part of our continuing mission to provide you with   exceptional heart care, we have created designated Provider Care Teams.  These Care Teams include your primary Cardiologist (physician) and Advanced Practice Providers (APPs -  Physician Assistants and Nurse Practitioners) who all work together to provide you with the care you need, when you need it.  We recommend signing up for the  patient portal called "MyChart".  Sign up information is provided on this After Visit Summary.  MyChart is used to connect with patients for Virtual Visits (Telemedicine).  Patients are able to view lab/test results, encounter notes, upcoming appointments, etc.  Non-urgent messages can be sent to your provider as well.   To learn more about what you can do with MyChart, go to [email or web address].com.    Your next appointment:   4 week(s)  Provider:   Canyon Day O'Neal, MD  Other Instructions       Cardiac/Peripheral Catheterization   You are scheduled for a Cardiac Catheterization on Thursday, April 11th with Dr. Arun Thukkani.  1. Please arrive at the Main Entrance A at [facility]: [address] [place], [place] [address] on April 11 at 8:30 AM (This time is two hours before your procedure to ensure your preparation). Free valet parking service is available. You will check in at ADMITTING. The support person will be asked to wait in the waiting room.  It is OK to have someone drop you off and come back when you are ready to be discharged.        Special note: Every effort is made to have your procedure done on time. Please understand that emergencies sometimes delay scheduled procedures.   . 2. Diet: Do not eat solid foods after midnight.  You may have clear liquids until 5 AM the day of the procedure.  3. Labs: You will have blood drawn today: CBC, BMET, LIPID  4. Medication instructions in preparation for your procedure:   Contrast Allergy: No   Do not take Diabetes Med Glucophage (Metformin) on the day of the procedure and HOLD 48 HOURS AFTER THE PROCEDURE.  On the morning of your procedure, take Aspirin 81 mg and any morning medicines NOT listed above.  You may use sips of water.  5. Plan to go home the same day, you will only stay overnight if medically necessary. 6. You MUST have a responsible adult to drive you home. 7. An adult MUST be with you the first 24 hours  after you arrive home. 8. Bring a current list of your medications, and the last time and date medication taken. 9. Bring ID and current insurance cards. 10.Please wear clothes that are easy to get on and off and wear slip-on shoes.  Thank you for allowing us to care for you!   -- [facility]     Time Spent with Patient: I have spent a total of 35 minutes with patient reviewing hospital notes, telemetry, EKGs, labs and examining the patient as well as establishing an assessment and plan that was discussed with the patient.  > 50% of time was spent in direct patient care.  Signed, Quincy T. O'Neal, MD, FACC [place]  [facility]  [address], [address] [place], [place] [address] [phone]  10/17/2022 1:46 PM     

## 2022-10-17 NOTE — Telephone Encounter (Signed)
Called patient, advised to come into the office today at 1 with Dr.O'Neal.  Patient verbalized understanding, she will come in today.

## 2022-10-18 ENCOUNTER — Encounter (HOSPITAL_COMMUNITY): Payer: Self-pay

## 2022-10-18 ENCOUNTER — Ambulatory Visit (HOSPITAL_COMMUNITY): Payer: 59

## 2022-10-18 LAB — CBC
Hematocrit: 34.5 % (ref 34.0–46.6)
Hemoglobin: 10.5 g/dL — ABNORMAL LOW (ref 11.1–15.9)
MCH: 22.9 pg — ABNORMAL LOW (ref 26.6–33.0)
MCHC: 30.4 g/dL — ABNORMAL LOW (ref 31.5–35.7)
MCV: 75 fL — ABNORMAL LOW (ref 79–97)
Platelets: 293 10*3/uL (ref 150–450)
RBC: 4.59 x10E6/uL (ref 3.77–5.28)
RDW: 17.5 % — ABNORMAL HIGH (ref 11.7–15.4)
WBC: 10.8 10*3/uL (ref 3.4–10.8)

## 2022-10-18 LAB — LIPID PANEL
Chol/HDL Ratio: 2.9 ratio (ref 0.0–4.4)
Cholesterol, Total: 127 mg/dL (ref 100–199)
HDL: 44 mg/dL (ref >39)
LDL Chol Calc (NIH): 61 mg/dL (ref 0–99)
Triglycerides: 125 mg/dL (ref 0–149)
VLDL Cholesterol Cal: 22 mg/dL (ref 5–40)

## 2022-10-18 LAB — BASIC METABOLIC PANEL
BUN/Creatinine Ratio: 9 — ABNORMAL LOW (ref 12–28)
BUN: 6 mg/dL — ABNORMAL LOW (ref 8–27)
CO2: 22 mmol/L (ref 20–29)
Calcium: 9.1 mg/dL (ref 8.7–10.3)
Chloride: 102 mmol/L (ref 96–106)
Creatinine, Ser: 0.67 mg/dL (ref 0.57–1.00)
Glucose: 99 mg/dL (ref 70–99)
Potassium: 4.5 mmol/L (ref 3.5–5.2)
Sodium: 140 mmol/L (ref 134–144)
eGFR: 100 mL/min/{1.73_m2} (ref >59)

## 2022-10-18 NOTE — Therapy (Signed)
Chart review of patient shows surgery was cancelled likely due to cardiac issues.  8:21 AM, 10/18/22 Trevone Prestwood Small Amarilys Lyles MPT [facility] [place] [phone]

## 2022-10-19 ENCOUNTER — Telehealth: Payer: Self-pay | Admitting: *Deleted

## 2022-10-19 NOTE — Telephone Encounter (Signed)
Cardiac Catheterization scheduled at [facility] for: Thursday October 20, 2022 10:30 AM Arrival time [facility] Main Entrance A at: 8:30 AM  Nothing to eat after midnight prior to procedure, clear liquids until 5 AM day of procedure.  Medication instructions: -Hold:  Metformin-day of procedure and 48 hours post procedure  HCTZ-AM of procedure -Other usual morning medications can be taken with sips of water including aspirin 81 mg.  Confirmed patient has responsible adult to drive home post procedure and be with patient first 24 hours after arriving home.  Plan to go home the same day, you will only stay overnight if medically necessary.  Reviewed procedure instructions with patient.

## 2022-10-20 ENCOUNTER — Other Ambulatory Visit: Payer: Self-pay

## 2022-10-20 ENCOUNTER — Ambulatory Visit (HOSPITAL_COMMUNITY)
Admission: RE | Admit: 2022-10-20 | Discharge: 2022-10-20 | Disposition: A | Discharge status: Home / Self Care | Payer: 59 | Attending: Internal Medicine | Admitting: Internal Medicine

## 2022-10-20 ENCOUNTER — Encounter (HOSPITAL_COMMUNITY)
Admission: RE | Disposition: A | Discharge status: Home / Self Care | Payer: Self-pay | Source: Home / Self Care | Attending: Internal Medicine

## 2022-10-20 ENCOUNTER — Ambulatory Visit: Payer: 59 | Admitting: Cardiovascular Disease

## 2022-10-20 ENCOUNTER — Encounter (HOSPITAL_COMMUNITY): Payer: 59

## 2022-10-20 DIAGNOSIS — Z79899 Other long term (current) drug therapy: Secondary | ICD-10-CM | POA: Insufficient documentation

## 2022-10-20 DIAGNOSIS — E119 Type 2 diabetes mellitus without complications: Secondary | ICD-10-CM | POA: Diagnosis not present

## 2022-10-20 DIAGNOSIS — R0602 Shortness of breath: Secondary | ICD-10-CM | POA: Diagnosis not present

## 2022-10-20 DIAGNOSIS — F17210 Nicotine dependence, cigarettes, uncomplicated: Secondary | ICD-10-CM | POA: Diagnosis not present

## 2022-10-20 DIAGNOSIS — E782 Mixed hyperlipidemia: Secondary | ICD-10-CM | POA: Insufficient documentation

## 2022-10-20 DIAGNOSIS — Z7984 Long term (current) use of oral hypoglycemic drugs: Secondary | ICD-10-CM | POA: Insufficient documentation

## 2022-10-20 DIAGNOSIS — I25119 Atherosclerotic heart disease of native coronary artery with unspecified angina pectoris: Secondary | ICD-10-CM | POA: Diagnosis not present

## 2022-10-20 DIAGNOSIS — D649 Anemia, unspecified: Secondary | ICD-10-CM | POA: Diagnosis not present

## 2022-10-20 DIAGNOSIS — I25110 Atherosclerotic heart disease of native coronary artery with unstable angina pectoris: Secondary | ICD-10-CM | POA: Insufficient documentation

## 2022-10-20 DIAGNOSIS — I25118 Atherosclerotic heart disease of native coronary artery with other forms of angina pectoris: Secondary | ICD-10-CM

## 2022-10-20 DIAGNOSIS — D509 Iron deficiency anemia, unspecified: Secondary | ICD-10-CM

## 2022-10-20 HISTORY — PX: CORONARY PRESSURE/FFR STUDY: CATH118243

## 2022-10-20 HISTORY — PX: LEFT HEART CATH AND CORONARY ANGIOGRAPHY: CATH118249

## 2022-10-20 LAB — GLUCOSE, CAPILLARY
Glucose-Capillary: 119 mg/dL — ABNORMAL HIGH (ref 70–99)
Glucose-Capillary: 111 mg/dL — ABNORMAL HIGH (ref 70–99)

## 2022-10-20 LAB — POCT ACTIVATED CLOTTING TIME: Activated Clotting Time: 255 seconds

## 2022-10-20 SURGERY — LEFT HEART CATH AND CORONARY ANGIOGRAPHY
Anesthesia: LOCAL

## 2022-10-20 MED ORDER — SODIUM CHLORIDE 0.9% FLUSH
3.0000 mL | INTRAVENOUS | Status: DC | PRN
  Administered 2022-10-20: 3 mL via INTRAVENOUS

## 2022-10-20 MED ORDER — VERAPAMIL HCL 2.5 MG/ML IV SOLN
INTRAVENOUS | Status: DC | PRN
  Administered 2022-10-20: 10 mL via INTRA_ARTERIAL

## 2022-10-20 MED ORDER — SODIUM CHLORIDE 0.9% FLUSH: 3.0000 mL | INTRAVENOUS | Status: DC | PRN

## 2022-10-20 MED ORDER — FENTANYL CITRATE (PF) 100 MCG/2ML IJ SOLN
INTRAMUSCULAR | Status: DC | PRN
  Administered 2022-10-20 (×2): 25 ug via INTRAVENOUS

## 2022-10-20 MED ORDER — FENTANYL CITRATE (PF) 100 MCG/2ML IJ SOLN
INTRAMUSCULAR | Status: AC
  Filled 2022-10-20: qty 2

## 2022-10-20 MED ORDER — SODIUM CHLORIDE 0.9 % WEIGHT BASED INFUSION
3.0000 mL/kg/h | INTRAVENOUS | Status: AC
  Administered 2022-10-20: 3 mL/kg/h via INTRAVENOUS

## 2022-10-20 MED ORDER — SODIUM CHLORIDE 0.9 % WEIGHT BASED INFUSION: 1.0000 mL/kg/h | INTRAVENOUS | Status: DC

## 2022-10-20 MED ORDER — LIDOCAINE HCL (PF) 1 % IJ SOLN
INTRAMUSCULAR | Status: DC | PRN
  Administered 2022-10-20: 2 mL

## 2022-10-20 MED ORDER — HEPARIN (PORCINE) IN NACL 1000-0.9 UT/500ML-% IV SOLN
INTRAVENOUS | Status: DC | PRN
  Administered 2022-10-20 (×2): 500 mL

## 2022-10-20 MED ORDER — FUROSEMIDE 20 MG PO TABS: 20.0000 mg | ORAL_TABLET | Freq: Two times a day (BID) | ORAL | 11 refills | Status: DC

## 2022-10-20 MED ORDER — SODIUM CHLORIDE 0.9 % IV SOLN: 250.0000 mL | INTRAVENOUS | Status: DC | PRN

## 2022-10-20 MED ORDER — ASPIRIN 81 MG PO CHEW
81.0000 mg | CHEWABLE_TABLET | ORAL | Status: AC
  Administered 2022-10-20: 81 mg via ORAL

## 2022-10-20 MED ORDER — LABETALOL HCL 5 MG/ML IV SOLN: 10.0000 mg | INTRAVENOUS | Status: DC | PRN

## 2022-10-20 MED ORDER — HEPARIN SODIUM (PORCINE) 1000 UNIT/ML IJ SOLN
INTRAMUSCULAR | Status: AC
  Filled 2022-10-20: qty 10

## 2022-10-20 MED ORDER — SODIUM CHLORIDE 0.9% FLUSH: 3.0000 mL | Freq: Two times a day (BID) | INTRAVENOUS | Status: DC

## 2022-10-20 MED ORDER — FUROSEMIDE 10 MG/ML IJ SOLN
20.0000 mg | Freq: Once | INTRAMUSCULAR | Status: AC
  Administered 2022-10-20: 20 mg via INTRAVENOUS
  Filled 2022-10-20: qty 4

## 2022-10-20 MED ORDER — ONDANSETRON HCL 4 MG/2ML IJ SOLN: 4.0000 mg | Freq: Four times a day (QID) | INTRAMUSCULAR | Status: DC | PRN

## 2022-10-20 MED ORDER — MIDAZOLAM HCL 2 MG/2ML IJ SOLN
INTRAMUSCULAR | Status: AC
  Filled 2022-10-20: qty 2

## 2022-10-20 MED ORDER — HYDRALAZINE HCL 20 MG/ML IJ SOLN: 10.0000 mg | INTRAMUSCULAR | Status: DC | PRN

## 2022-10-20 MED ORDER — SODIUM CHLORIDE 0.9% FLUSH
3.0000 mL | Freq: Two times a day (BID) | INTRAVENOUS | Status: DC
  Administered 2022-10-20: 3 mL via INTRAVENOUS

## 2022-10-20 MED ORDER — ADENOSINE 12 MG/4ML IV SOLN
INTRAVENOUS | Status: AC
  Filled 2022-10-20: qty 16

## 2022-10-20 MED ORDER — IOHEXOL 350 MG/ML SOLN
INTRAVENOUS | Status: DC | PRN
  Administered 2022-10-20: 55 mL

## 2022-10-20 MED ORDER — VERAPAMIL HCL 2.5 MG/ML IV SOLN
INTRAVENOUS | Status: AC
  Filled 2022-10-20: qty 2

## 2022-10-20 MED ORDER — ACETAMINOPHEN 325 MG PO TABS: 650.0000 mg | ORAL_TABLET | ORAL | Status: DC | PRN

## 2022-10-20 MED ORDER — HEPARIN SODIUM (PORCINE) 1000 UNIT/ML IJ SOLN
INTRAMUSCULAR | Status: DC | PRN
  Administered 2022-10-20: 6000 [IU] via INTRA_ARTERIAL
  Administered 2022-10-20: 5000 [IU] via INTRA_ARTERIAL
  Administered 2022-10-20: 1000 [IU] via INTRA_ARTERIAL

## 2022-10-20 MED ORDER — ADENOSINE (DIAGNOSTIC) 140MCG/KG/MIN
INTRAVENOUS | Status: DC | PRN
  Administered 2022-10-20: 140 ug/kg/min via INTRAVENOUS

## 2022-10-20 MED ORDER — MIDAZOLAM HCL 2 MG/2ML IJ SOLN
INTRAMUSCULAR | Status: DC | PRN
  Administered 2022-10-20 (×2): 1 mg via INTRAVENOUS

## 2022-10-20 SURGICAL SUPPLY — 14 items
CATH INFINITI 5FR ANG PIGTAIL (CATHETERS) IMPLANT
CATH LAUNCHER 6FR EBU3.5 (CATHETERS) IMPLANT
CATH OPTITORQUE TIG 4.0 6F (CATHETERS) IMPLANT
CATH VISTA GUIDE 6FR JR4 (CATHETERS) IMPLANT
DEVICE RAD TR BAND REGULAR (VASCULAR PRODUCTS) IMPLANT
GLIDESHEATH SLEND SS 6F .021 (SHEATH) IMPLANT
GUIDEWIRE INQWIRE 1.5J.035X260 (WIRE) IMPLANT
GUIDEWIRE PRESSURE X 175 (WIRE) IMPLANT
INQWIRE 1.5J .035X260CM (WIRE) ×1
KIT ESSENTIALS PG (KITS) IMPLANT
KIT HEART LEFT (KITS) ×2 IMPLANT
PACK CARDIAC CATHETERIZATION (CUSTOM PROCEDURE TRAY) ×2 IMPLANT
TRANSDUCER W/STOPCOCK (MISCELLANEOUS) ×2 IMPLANT
TUBING CIL FLEX 10 FLL-RA (TUBING) ×2 IMPLANT

## 2022-10-20 NOTE — Interval H&P Note (Signed)
History and Physical Interval Note:  10/20/2022 11:17 AM  [name]  has presented today for surgery, with the diagnosis of unstable angina.  The various methods of treatment have been discussed with the patient and family. After consideration of risks, benefits and other options for treatment, the patient has consented to  Procedure(s): LEFT HEART CATH AND CORONARY ANGIOGRAPHY (N/A) as a surgical intervention.  The patient's history has been reviewed, patient examined, no change in status, stable for surgery.  I have reviewed the patient's chart and labs.  Questions were answered to the patient's satisfaction.     [name]

## 2022-10-20 NOTE — Discharge Instructions (Addendum)

## 2022-10-20 NOTE — Progress Notes (Signed)
TR BAND REMOVAL  LOCATION:    right radial  DEFLATED PER PROTOCOL:    Yes.    TIME BAND OFF / DRESSING APPLIED:    1350 gauze dressing applied   SITE UPON ARRIVAL:    Level 0  SITE AFTER BAND REMOVAL:    Level 0  CIRCULATION SENSATION AND MOVEMENT:    Within Normal Limits   Yes.    COMMENTS:   no issues noted  

## 2022-10-21 ENCOUNTER — Ambulatory Visit (HOSPITAL_COMMUNITY)
Admission: RE | Admit: 2022-10-21 | Discharge: 2022-10-21 | Disposition: A | Discharge status: Home / Self Care | Payer: 59 | Source: Ambulatory Visit | Attending: Internal Medicine | Admitting: Internal Medicine

## 2022-10-21 ENCOUNTER — Encounter (HOSPITAL_COMMUNITY): Payer: Self-pay | Admitting: Internal Medicine

## 2022-10-21 DIAGNOSIS — Z1231 Encounter for screening mammogram for malignant neoplasm of breast: Secondary | ICD-10-CM

## 2022-10-24 ENCOUNTER — Encounter (HOSPITAL_COMMUNITY): Payer: 59

## 2022-10-26 ENCOUNTER — Encounter (HOSPITAL_COMMUNITY): Payer: 59

## 2022-10-31 ENCOUNTER — Encounter (HOSPITAL_COMMUNITY): Payer: 59

## 2022-11-02 ENCOUNTER — Encounter (HOSPITAL_COMMUNITY): Payer: 59

## 2022-11-03 ENCOUNTER — Ambulatory Visit (HOSPITAL_COMMUNITY): Payer: 59 | Attending: Cardiovascular Disease

## 2022-11-03 DIAGNOSIS — R0602 Shortness of breath: Secondary | ICD-10-CM | POA: Insufficient documentation

## 2022-11-04 ENCOUNTER — Encounter (HOSPITAL_COMMUNITY): Payer: 59

## 2022-11-04 LAB — ECHOCARDIOGRAM COMPLETE
Area-P 1/2: 2.52 cm2
P 1/2 time: 363 msec
S' Lateral: 2.8 cm

## 2022-11-07 ENCOUNTER — Encounter (HOSPITAL_COMMUNITY): Payer: 59

## 2022-11-09 ENCOUNTER — Encounter (HOSPITAL_COMMUNITY): Payer: 59

## 2022-11-14 ENCOUNTER — Encounter (HOSPITAL_COMMUNITY): Payer: 59

## 2022-11-16 ENCOUNTER — Encounter (HOSPITAL_COMMUNITY): Payer: 59

## 2022-11-17 NOTE — Progress Notes (Signed)
Cardiology Office Note:   Date:  11/18/2022  NAME:  [name]    MRN: [id number] DOB:  04/13/1961   PCP:  Rebekah Chesterfield, NP  Cardiologist:  None  Electrophysiologist:  None   Referring MD: Vickki Hearing, MD   Chief Complaint  Patient presents with   Follow-up         History of Present Illness:   [name] is a 62 y.o. female with a hx of CAD, HFpEF, tobacco abuse who presents for follow-up.  She is doing well from a heart standpoint.  No chest pains.  Still getting short of breath.  She is significant lower extremity edema.  We discussed increasing her Lasix for several days.  We also discussed starting Aldactone.  BP is stable.  Lungs are clear.  Right radial cath site clean and dry.  We also discussed working on salt reduction.  She is consuming potato chips.  This is high in salt.  She also needs to avoid restaurants and fast food.  She does need a right knee replacement.  At this point from my standpoint she is okay to proceed.  We just need to get her fluid status more accessible.  Problem List CAD -non-obstructive CAD LHC 10/20/2022 -CAC score 800 (99th percentile) -TPV 856 mm3 (calcified 231 mm3; non-calcified plaque 625 mm3) -T chol 127, HDL 44, LDL 61, TG 125 2. HFpEF -LVEDP 32 LHC 10/20/2022 3. DM -A1c 6.8 4. HLD 5. Tobacco abuse   Past Medical History: Past Medical History:  Diagnosis Date   Anxiety    Arthritis    Asthma    Cancer (HCC)    cervical cancer; pt states a long long time ago   CHF (congestive heart failure) (HCC)    Coronary artery disease    Depression    Diabetes mellitus without complication (HCC)    Dyspnea    Edema of all four extremities    Essential (primary) hypertension 06/24/2020   Hyperlipemia    Type II diabetes mellitus (HCC) 03/22/2022    Past Surgical History: Past Surgical History:  Procedure Laterality Date   ABDOMINAL HYSTERECTOMY     ANTERIOR LAT LUMBAR FUSION N/A 11/05/2019   Procedure:  Lumbar Four-Five Anterolateral lumbar interbody fusion with lateral plate;  Surgeon: Barnett Abu, MD;  Location: [facility];  Service: Neurosurgery;  Laterality: N/A;  anterolateral   BACK SURGERY     pins   BACK SURGERY  07/11/2008   BIOPSY  06/29/2021   Procedure: BIOPSY;  Surgeon: Dolores Frame, MD;  Location: [facility];  Service: Gastroenterology;;   BLADDER REPAIR     CARPAL TUNNEL RELEASE Right    CESAREAN SECTION  1982, 1984   COLONOSCOPY N/A 06/12/2013   rehman: normal   CORONARY PRESSURE/FFR STUDY N/A 10/20/2022   Procedure: CORONARY PRESSURE/FFR STUDY;  Surgeon: [name], MD;  Location: [facility];  Service: Cardiovascular;  Laterality: N/A;   ESOPHAGOGASTRODUODENOSCOPY (EGD) WITH PROPOFOL N/A 06/29/2021   Procedure: ESOPHAGOGASTRODUODENOSCOPY (EGD) WITH PROPOFOL;  Surgeon: Dolores Frame, MD;  Location: [facility];  Service: Gastroenterology;  Laterality: N/A;  8:40   KNEE ARTHROSCOPY WITH MEDIAL MENISECTOMY Right 05/09/2019   Procedure: KNEE ARTHROSCOPY WITH MEDIAL MENISECTOMY AND LATERAL MENISECTOMY;  Surgeon: Vickki Hearing, MD;  Location: [facility];  Service: Orthopedics;  Laterality: Right;   KNEE ARTHROSCOPY WITH MEDIAL MENISECTOMY Left 06/27/2019   Procedure: KNEE ARTHROSCOPY WITH MEDIAL MENISCECTOMY;  Surgeon: Vickki Hearing, MD;  Location: [facility];  Service: Orthopedics;  Laterality: Left;   LEFT HEART CATH AND CORONARY ANGIOGRAPHY N/A 10/20/2022   Procedure: LEFT HEART CATH AND CORONARY ANGIOGRAPHY;  Surgeon: [name], MD;  [facility]: [facility];  Service: Cardiovascular;  Laterality: N/A;   TOTAL KNEE ARTHROPLASTY Left 03/15/2022   Procedure: TOTAL KNEE ARTHROPLASTY;  Surgeon: Vickki Hearing, MD;  Location: [facility];  Service: Orthopedics;  Laterality: Left;    Current Medications: Current Meds  Medication Sig   albuterol (VENTOLIN HFA) 108 (90 Base) MCG/ACT inhaler Inhale 1-2 puffs into the lungs every 6  (six) hours as needed for wheezing or shortness of breath.   ARIPiprazole (ABILIFY) 2 MG tablet Take 2 mg by mouth daily.   aspirin EC 81 MG tablet Take 81 mg by mouth daily. Swallow whole.   atorvastatin (LIPITOR) 80 MG tablet Take 1 tablet (80 mg total) by mouth daily.   clobetasol ointment (TEMOVATE) 0.05 % Apply daily to affected area for 2 weeks then 2-3 x weekly   estradiol (ESTRACE) 2 MG tablet TAKE ONE TABLET AT BEDTIME   gabapentin (NEURONTIN) 300 MG capsule Take 1 capsule (300 mg total) by mouth 3 (three) times daily.   loratadine (CLARITIN) 10 MG tablet Take 10 mg by mouth daily as needed for allergies.   metFORMIN (GLUCOPHAGE) 500 MG tablet Take 500 mg by mouth daily with breakfast.   metoprolol tartrate (LOPRESSOR) 25 MG tablet Take 1 tablet (25 mg total) by mouth 2 (two) times daily.   nitroGLYCERIN (NITROSTAT) 0.4 MG SL tablet Place 1 tablet (0.4 mg total) under the tongue every 5 (five) minutes as needed for chest pain.   omeprazole (PRILOSEC) 40 MG capsule Take 1 capsule (40 mg total) by mouth daily.   oxyCODONE-acetaminophen (PERCOCET) 7.5-325 MG tablet Take 1 tablet by mouth 4 (four) times daily.   oxymetazoline (AFRIN) 0.05 % nasal spray Place 1 spray into both nostrils 2 (two) times daily as needed for congestion.   potassium chloride SA (KLOR-CON M20) 20 MEQ tablet Take 1 tablet (20 mEq total) by mouth daily.   spironolactone (ALDACTONE) 25 MG tablet Take 1 tablet (25 mg total) by mouth daily.   traZODone (DESYREL) 100 MG tablet Take 200 mg by mouth at bedtime.   [DISCONTINUED] furosemide (LASIX) 20 MG tablet Take 1 tablet (20 mg total) by mouth 2 (two) times daily.   [DISCONTINUED] hydrochlorothiazide (HYDRODIURIL) 25 MG tablet Take 25 mg by mouth every morning.   Current Facility-Administered Medications for the 11/18/22 encounter (Office Visit) with Sande Rives, MD  Medication   bupivacaine-meloxicam ER (ZYNRELEF) injection 400 mg     Allergies:    Patient  has no known allergies.   Social History: Social History   Socioeconomic History   Marital status: Single    Spouse name: Not on file   Number of children: 2   Years of education: Not on file   Highest education level: Not on file  Occupational History   Occupation: Disabled  Tobacco Use   Smoking status: Former    Packs/day: 1.00    Years: 20.00    Additional pack years: 0.00    Total pack years: 20.00    Types: Cigarettes   Smokeless tobacco: Never   Tobacco comments:    trying to quit  Vaping Use   Vaping Use: Some days  Substance and Sexual Activity   Alcohol use: No    Alcohol/week: 0.0 standard drinks of alcohol   Drug use:  No   Sexual activity: Not Currently    Birth control/protection: Surgical    Comment: hyst  Other Topics Concern   Not on file  Social History Narrative   ** Merged History Encounter **       Social Determinants of Health   Financial Resource Strain: Low Risk  (10/06/2022)   Overall Financial Resource Strain (CARDIA)    Difficulty of Paying Living Expenses: Not very hard  Food Insecurity: No Food Insecurity (10/06/2022)   Hunger Vital Sign    Worried About Running Out of Food in the Last Year: Never true    Ran Out of Food in the Last Year: Never true  Transportation Needs: No Transportation Needs (10/06/2022)   PRAPARE - Administrator, Civil Service (Medical): No    Lack of Transportation (Non-Medical): No  Physical Activity: Inactive (10/06/2022)   Exercise Vital Sign    Days of Exercise per Week: 0 days    Minutes of Exercise per Session: 0 min  Stress: No Stress Concern Present (10/06/2022)   [employer] of Occupational Health - Occupational Stress Questionnaire    Feeling of Stress : Not at all  Social Connections: Socially Isolated (10/06/2022)   Social Connection and Isolation Panel [NHANES]    Frequency of Communication with Friends and Family: Once a week    Frequency of Social Gatherings with Friends and  Family: Once a week    Attends Religious Services: Never    Database administrator or Organizations: No    Attends Engineer, structural: Never    Marital Status: Divorced     Family History: The patient's family history includes Cancer in her maternal grandmother, paternal grandfather, and paternal grandmother; Colon cancer in an other family member; Heart disease in her father; Lung cancer in her mother; Other in her brother.  ROS:   All other ROS reviewed and negative. Pertinent positives noted in the HPI.     EKGs/Labs/Other Studies Reviewed:   The following studies were personally reviewed by me today:  EKG:  EKG is ordered today.  The ekg ordered today demonstrates normal sinus rhythm heart 77, no acute ischemic changes or evidence of infarction, and was personally reviewed by me.   Recent Labs: 10/17/2022: BUN 6; Creatinine, Ser 0.67; Hemoglobin 10.5; Platelets 293; Potassium 4.5; Sodium 140   Recent Lipid Panel    Component Value Date/Time   CHOL 127 10/17/2022 1321   TRIG 125 10/17/2022 1321   HDL 44 10/17/2022 1321   CHOLHDL 2.9 10/17/2022 1321   LDLCALC 61 10/17/2022 1321    Physical Exam:   VS:  BP 136/84   Pulse 77   Ht 5\' 6"  (1.676 m)   Wt 235 lb 3.2 oz (106.7 kg)   SpO2 93%   BMI 37.96 kg/m    Wt Readings from Last 3 Encounters:  11/18/22 235 lb 3.2 oz (106.7 kg)  10/20/22 223 lb (101.2 kg)  10/17/22 232 lb (105.2 kg)    General: Well nourished, well developed, in no acute distress Head: Atraumatic, normal size  Eyes: PEERLA, EOMI  Neck: Supple, no JVD Endocrine: No thryomegaly Cardiac: Normal S1, S2; RRR; no murmurs, rubs, or gallops Lungs: Clear to auscultation bilaterally, no wheezing, rhonchi or rales  Abd: Soft, nontender, no hepatomegaly  Ext: 2+ pitting edema Musculoskeletal: No deformities, BUE and BLE strength normal and equal Skin: Warm and dry, no rashes   Neuro: Alert and oriented to person, place, time, and situation, CNII-XII  grossly intact, no focal deficits  Psych: Normal mood and affect   ASSESSMENT:   ELENORE PERAINO is a 61 y.o. female who presents for the following: 1. SOB (shortness of breath)   2. Chronic diastolic heart failure (HCC)   3. Coronary artery disease involving native coronary artery of native heart without angina pectoris   4. Mixed hyperlipidemia   5. Preoperative cardiovascular examination   6. Tobacco abuse     PLAN:   1. SOB (shortness of breath) 2. Chronic diastolic heart failure (HCC) -Symptoms are consistent with diastolic heart failure.  Elevated left ventricular end-diastolic pressure at the time of left heart catheterization.  She is volume up.  Increase Lasix to 40 mg twice daily for 3 days.  Then she will take 40 mg daily.  We will give her 20 mill equivalents of potassium to take daily.  She will also start Aldactone 25 mg daily.  Stop HCTZ.  We also discussed salt reduction and exercise.  These are the mainstay of treatment for diastolic heart failure.  Weight loss will also help immensely.  Blood pressure is well-controlled.  No further symptoms of angina.  She will see me back in 3 months.  3. Coronary artery disease involving native coronary artery of native heart without angina pectoris 4. Mixed hyperlipidemia -Nonobstructive CAD on cath.  On aspirin 81 mg daily.  On Lipitor.  LDL 61 which is at goal.  5. Preoperative cardiovascular examination -No further testing needed prior to knee surgery.  She may proceed.  We will work to get fluid off.  If she continues to have fluid retention we will be happy to see her back.  We have a plan to increase her diuretics as above.  6. Tobacco abuse -Still smokes 1 pack/day.  3 minutes of smoking cessation counseling was provided in office today.      Disposition: Return in about 3 months (around 02/18/2023).  Medication Adjustments/Labs and Tests Ordered: Current medicines are reviewed at length with the patient today.   Concerns regarding medicines are outlined above.  Orders Placed This Encounter  Procedures   EKG 12-Lead   Meds ordered this encounter  Medications   furosemide (LASIX) 40 MG tablet    Sig: Take 1 tablet (40 mg total) by mouth daily.    Dispense:  90 tablet    Refill:  3   potassium chloride SA (KLOR-CON M20) 20 MEQ tablet    Sig: Take 1 tablet (20 mEq total) by mouth daily.    Dispense:  90 tablet    Refill:  3   spironolactone (ALDACTONE) 25 MG tablet    Sig: Take 1 tablet (25 mg total) by mouth daily.    Dispense:  90 tablet    Refill:  3    Patient Instructions  Medication Instructions:  Take Lasix 40 mg twice daily for 3 days, then decrease to 40 mg daily Take Potassium 20 meq daily  Start Aldactone 25 mg daily  STOP Hydrochlorothiazide   *If you need a refill on your cardiac medications before your next appointment, please call your pharmacy*   Follow-Up: At [facility], you and your health needs are our priority.  As part of our continuing mission to provide you with exceptional heart care, we have created designated Provider Care Teams.  These Care Teams include your primary Cardiologist (physician) and Advanced Practice Providers (APPs -  Physician Assistants and Nurse Practitioners) who all work together to provide you with the care you  need, when you need it.  We recommend signing up for the patient portal called "MyChart".  Sign up information is provided on this After Visit Summary.  MyChart is used to connect with patients for Virtual Visits (Telemedicine).  Patients are able to view lab/test results, encounter notes, upcoming appointments, etc.  Non-urgent messages can be sent to your provider as well.   To learn more about what you can do with MyChart, go to [email or web address].    Your next appointment:   3 month(s)  Provider:   Lennie Odor, MD or Marjie Skiff, PA-C, or Azalee Course, PA-C        Time Spent with Patient: I have spent a  total of 35 minutes with patient reviewing hospital notes, telemetry, EKGs, labs and examining the patient as well as establishing an assessment and plan that was discussed with the patient.  > 50% of time was spent in direct patient care.  Signed, Lenna Gilford. Flora Lipps, MD, [facility]  [facility]  [address], [address] [place], [place] [address] [phone]  11/18/2022 8:32 AM

## 2022-11-18 ENCOUNTER — Ambulatory Visit: Payer: 59 | Attending: Cardiovascular Disease | Admitting: Cardiovascular Disease

## 2022-11-18 ENCOUNTER — Encounter: Payer: Self-pay | Admitting: Cardiovascular Disease

## 2022-11-18 VITALS — BP 136/84 | HR 77 | Ht 66.0 in | Wt 235.2 lb

## 2022-11-18 DIAGNOSIS — Z01810 Encounter for preprocedural cardiovascular examination: Secondary | ICD-10-CM

## 2022-11-18 DIAGNOSIS — R0602 Shortness of breath: Secondary | ICD-10-CM

## 2022-11-18 DIAGNOSIS — E782 Mixed hyperlipidemia: Secondary | ICD-10-CM

## 2022-11-18 DIAGNOSIS — I2510 Atherosclerotic heart disease of native coronary artery without angina pectoris: Secondary | ICD-10-CM

## 2022-11-18 DIAGNOSIS — I5032 Chronic diastolic (congestive) heart failure: Secondary | ICD-10-CM

## 2022-11-18 DIAGNOSIS — F17210 Nicotine dependence, cigarettes, uncomplicated: Secondary | ICD-10-CM

## 2022-11-18 DIAGNOSIS — Z720 Tobacco use: Secondary | ICD-10-CM

## 2022-11-18 MED ORDER — POTASSIUM CHLORIDE CRYS ER 20 MEQ PO TBCR: 20.0000 meq | EXTENDED_RELEASE_TABLET | Freq: Every day | ORAL | 3 refills | Status: DC

## 2022-11-18 MED ORDER — SPIRONOLACTONE 25 MG PO TABS: 25.0000 mg | ORAL_TABLET | Freq: Every day | ORAL | 3 refills | Status: DC

## 2022-11-18 MED ORDER — FUROSEMIDE 40 MG PO TABS: 40.0000 mg | ORAL_TABLET | Freq: Every day | ORAL | 3 refills | Status: DC

## 2022-11-18 NOTE — Patient Instructions (Signed)
Medication Instructions:  Take Lasix 40 mg twice daily for 3 days, then decrease to 40 mg daily Take Potassium 20 meq daily  Start Aldactone 25 mg daily  STOP Hydrochlorothiazide   *If you need a refill on your cardiac medications before your next appointment, please call your pharmacy*   Follow-Up: At [facility], you and your health needs are our priority.  As part of our continuing mission to provide you with exceptional heart care, we have created designated Provider Care Teams.  These Care Teams include your primary Cardiologist (physician) and Advanced Practice Providers (APPs -  Physician Assistants and Nurse Practitioners) who all work together to provide you with the care you need, when you need it.  We recommend signing up for the patient portal called "MyChart".  Sign up information is provided on this After Visit Summary.  MyChart is used to connect with patients for Virtual Visits (Telemedicine).  Patients are able to view lab/test results, encounter notes, upcoming appointments, etc.  Non-urgent messages can be sent to your provider as well.   To learn more about what you can do with MyChart, go to [email or web address].    Your next appointment:   3 month(s)  Provider:   Lennie Odor, MD or Marjie Skiff, PA-C, or Azalee Course, [place]

## 2022-11-21 ENCOUNTER — Encounter (HOSPITAL_COMMUNITY): Payer: 59

## 2022-11-22 ENCOUNTER — Telehealth: Payer: Self-pay | Admitting: Orthopedic Surgery

## 2022-11-22 NOTE — Telephone Encounter (Signed)
5. Preoperative cardiovascular examination -No further testing needed prior to knee surgery.  She may proceed.  We will work to get fluid off.  If she continues to have fluid retention we will be happy to see her back.  We have a plan to increase her diuretics as above.   6. Tobacco abuse -Still smokes 1 pack/day.  3 minutes of smoking cessation counseling was provided in office today.  Are you able to proceed with the surgery ? At [facility]? ( Total knee replacement)

## 2022-11-22 NOTE — Telephone Encounter (Signed)
Dr. Mort Sawyers pt - spoke w/the patient, she stated that Dr. Lennie Odor has cleared her for surgery.

## 2022-11-23 ENCOUNTER — Encounter (HOSPITAL_COMMUNITY): Payer: 59

## 2022-11-23 NOTE — Telephone Encounter (Signed)
I have called her to discuss and she states she quit smoking when all the problems with her heart started, she also states she has increased diuretic to 40mg  per day and her swelling has decreased but she is not sure how much weight she has lost   I made appointment for her to discuss and document all of this with her, she is coming in on 12/09/22   To you FYI

## 2022-12-06 ENCOUNTER — Encounter: Payer: Self-pay | Admitting: Cardiovascular Disease

## 2022-12-06 NOTE — Telephone Encounter (Signed)
Error

## 2022-12-09 ENCOUNTER — Encounter: Payer: Self-pay | Admitting: Orthopedic Surgery

## 2022-12-09 ENCOUNTER — Ambulatory Visit (INDEPENDENT_AMBULATORY_CARE_PROVIDER_SITE_OTHER): Payer: 59 | Admitting: Orthopedic Surgery

## 2022-12-09 VITALS — BP 126/59 | HR 78 | Ht 62.0 in | Wt 240.0 lb

## 2022-12-09 DIAGNOSIS — Z96652 Presence of left artificial knee joint: Secondary | ICD-10-CM

## 2022-12-09 DIAGNOSIS — M1711 Unilateral primary osteoarthritis, right knee: Secondary | ICD-10-CM

## 2022-12-09 NOTE — Progress Notes (Signed)
   This is a follow-up appointment  Encounter Diagnoses  Name Primary?   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    Unilateral primary osteoarthritis, right knee Yes     Chief Complaint  Patient presents with   Knee Pain    Bil knee pain    Pre-op Exam    Preop evaluation for possible right total knee arthroplasty   Outpatient opioids oxycodone 04/12/2024 on 511 patient filled prescription for 120 tablets to last 30 days   She needs to diurese approximately 15 pounds of fluid with a goal weight of 225 pounds and she needs a negative nicotine test and I hemoglobin A1C under 8 last hemoglobin A1c was 6.8 on September 20, 2022     Latest Ref Rng & Units 10/17/2022    1:21 PM 10/05/2022    9:19 AM 09/20/2022   12:49 PM  CBC  WBC 3.4 - 10.8 x10E3/uL 10.8  8.6  11.1   Hemoglobin 11.1 - 15.9 g/dL 16.1  09.6  04.5   Hematocrit 34.0 - 46.6 % 34.5  34.9  36.3   Platelets 150 - 450 x10E3/uL 293  275  341       Latest Ref Rng & Units 10/17/2022    1:21 PM 10/05/2022    9:19 AM 09/20/2022   12:49 PM  BMP  Glucose 70 - 99 mg/dL 99  409  811   BUN 8 - 27 mg/dL 6  6  6    Creatinine 0.57 - 1.00 mg/dL 9.14  7.82  9.56   BUN/Creat Ratio 12 - 28 9  8     Sodium 134 - 144 mmol/L 140  139  136   Potassium 3.5 - 5.2 mmol/L 4.5  4.7  3.3   Chloride 96 - 106 mmol/L 102  102  101   CO2 20 - 29 mmol/L 22  24  23    Calcium 8.7 - 10.3 mg/dL 9.1  9.52  48.81    62 year old female presents for possible right total knee arthroplasty after cardiology has cleared her.  However, they also mention that she has retained fluid and is still short of breath.  As far as orthopedic surgery is concerned this is not conducive with surgical intervention at this time.  I have asked the patient to weigh herself weekly and our goal is to remove 15 pounds of fluid which would put her at about 225 pounds  She is also complaining of left knee pain.  However further questioning reveals that she has numbness tingling  occasional giving way and back pain.  She has a chronic history of lower back pain and this is most likely the cause of her left leg symptoms as she did well with her total knee and images have been normal  Recommend follow-up in a month check weight  We will also do a nicotine test when we get closer to the time of surgery to hold her feet to the fire so to speak in terms of her smoking

## 2022-12-12 ENCOUNTER — Telehealth: Payer: Self-pay

## 2022-12-12 NOTE — Telephone Encounter (Signed)
-----   Message from Sande Rives, MD sent at 12/09/2022  8:08 PM EDT ----- That is fine. -W ----- Message ----- From: Darene Lamer, LPN Sent: 1/61/0960   2:35 PM EDT To: Sande Rives, MD  Hey, none of the apps have anything for next week.   DOD next Friday Dr.Branch does have some openings on her schedule if this is okay.   Thanks!   ----- Message ----- From: Sande Rives, MD Sent: 12/09/2022   8:35 AM EDT To: Vickki Hearing, MD; Darene Lamer, LPN  Dr. Romeo Apple:  If the patient is volume overloaded and has symptoms of heart failure including shortness of breath and worsening lower extremity edema I would recommend to get this sorted out before she goes to surgery.  I will have her come back to the office and see one of our APP's next week to make sure she is not grossly volume overloaded.   Raynelle Fanning: Can you set her up to see an APP next week for preoperative assessment?  -Wes  Schuyler T. Flora Lipps, MD, [facility]  [facility]  [address], [address] [place], [place] [address] [phone]  8:35 AM  ----- Message ----- From: Vickki Hearing, MD Sent: 12/09/2022   8:30 AM EDT To: Sande Rives, MD  Good morning Dr. Flora Lipps  I am writing you to clarify the surgical clearance for the patient [name]  As I look at her notes her April 11 weight was 223 her current weight is 240 indicating approximately 17 pounds of fluid accumulation.  Although she was cleared from a cardiac standpoint my understanding from your notes is that this fluid accumulation needs to be diuresed prior to the surgery  Thank you for seeing her and if you could clarify would be very helpful when I present her to the anesthesiologist for preop  Cordially Dr. Romeo Apple

## 2022-12-12 NOTE — Progress Notes (Unsigned)
History of Present Illness: [name] is a 62 y.o. female who presents today for follow up / to re-establish care at [facility]. She was previously seen here by Dr. Pete Glatter in 2023.  - GU/GYN History: 1. Stress urinary incontinence. 2. OAB with urinary frequency, urgency, and uge incontinence. - Previously failed trial of oxybutynin. 3. Lichen sclerosus. 4. Vaginal atrophy. 5. Relevant surgical history includes: - Total abdominal hysterectomy with bilateral salpingo-oophorectomy - Cystocele repair 6. Not sexually active.  At last visit with Dr. Pete Glatter on 09/30/2021: - She reported some symptomatic improvement with trial of Myrbetriq 50 mg samples.  - On exam there was "some vaginal mesh exposure but this is not bothersome for her".  - The plan was: 1. Continue Kegel exercises 2. Trial of Vesicare 10 mg daily. Advised patient to call with results of medication in approximately 1 month. Discussed potential role of urodynamics for further evaluation of her incontinence if her symptoms not improve. 3. Return to office in 2-3 months.  Since last visit: - 10/06/2022: Per GYN note she reported urine leakage with cough or sneeze; wearing pads for leakage. She was prescribed Temovate for genital skin irritation and advised to use Aquaphor 3N1 on vulva to protect skin.   Today: She reports that over the past 3 years she has had progressively worsening urinary urgency and urge incontinence. She denies stress incontinence with cough/ laugh/ sneeze/ heavy lifting / exercise.  She leaks 4 times per day. Wears 4 pads per day. She recalls that Myrbetriq 50 mg samples helped some but not a lot; denies any symptomatic improvement with Vesicare.  Reports caffeine intake: 2 cups of coffee each morning and 1 glass of tea throughout the day.   She urinates 3-5x/day and 2-3x/night; states urinary frequency has increased since starting diuretic every morning for management of  bilateral lower extremity edema.   She reports some soreness when the urine touches the vaginal tissue. Denies dysuria. She reports 0 UTls in the past 12 months.  She denies gross hematuria, straining to void, or sensations of incomplete emptying.   Fall Screening: Do you usually have a device to assist in your mobility? No   Medications: Current Outpatient Medications  Medication Sig Dispense Refill   albuterol (VENTOLIN HFA) 108 (90 Base) MCG/ACT inhaler Inhale 1-2 puffs into the lungs every 6 (six) hours as needed for wheezing or shortness of breath.     ARIPiprazole (ABILIFY) 2 MG tablet Take 2 mg by mouth daily.     aspirin EC 81 MG tablet Take 81 mg by mouth daily. Swallow whole.     atorvastatin (LIPITOR) 80 MG tablet Take 1 tablet (80 mg total) by mouth daily. 90 tablet 3   bumetanide (BUMEX) 1 MG tablet Take 1 mg by mouth daily.     estradiol (ESTRACE) 2 MG tablet TAKE ONE TABLET AT BEDTIME 30 tablet 11   FLUoxetine (PROZAC) 40 MG capsule Take 40 mg by mouth daily.     furosemide (LASIX) 40 MG tablet Take 1 tablet (40 mg total) by mouth daily. 90 tablet 3   gabapentin (NEURONTIN) 300 MG capsule Take 1 capsule (300 mg total) by mouth 3 (three) times daily. 21 capsule 3   loratadine (CLARITIN) 10 MG tablet Take 10 mg by mouth daily as needed for allergies.     metFORMIN (GLUCOPHAGE) 500 MG tablet Take 500 mg by mouth daily with breakfast.     metoprolol tartrate (LOPRESSOR) 25 MG tablet Take 1  tablet (25 mg total) by mouth 2 (two) times daily. 180 tablet 3   mirabegron ER (MYRBETRIQ) 25 MG TB24 tablet Take 1 tablet (25 mg total) by mouth daily. 30 tablet 11   nitroGLYCERIN (NITROSTAT) 0.4 MG SL tablet Place 1 tablet (0.4 mg total) under the tongue every 5 (five) minutes as needed for chest pain. 25 tablet 3   omeprazole (PRILOSEC) 40 MG capsule Take 1 capsule (40 mg total) by mouth daily. 90 capsule 3   oxyCODONE-acetaminophen (PERCOCET) 7.5-325 MG tablet Take 1 tablet by mouth 4  (four) times daily.     oxymetazoline (AFRIN) 0.05 % nasal spray Place 1 spray into both nostrils 2 (two) times daily as needed for congestion.     potassium chloride SA (KLOR-CON M20) 20 MEQ tablet Take 1 tablet (20 mEq total) by mouth daily. 90 tablet 3   spironolactone (ALDACTONE) 25 MG tablet Take 1 tablet (25 mg total) by mouth daily. 90 tablet 3   traZODone (DESYREL) 100 MG tablet Take 200 mg by mouth at bedtime.     Current Facility-Administered Medications  Medication Dose Route Frequency Provider Last Rate Last Admin   bupivacaine-meloxicam ER (ZYNRELEF) injection 400 mg  400 mg Infiltration Once Vickki Hearing, MD        Allergies: No Known Allergies  Past Medical History:  Diagnosis Date   Anxiety    Arthritis    Asthma    Cancer (HCC)    cervical cancer; pt states a long long time ago   CHF (congestive heart failure) (HCC)    Coronary artery disease    Depression    Diabetes mellitus without complication (HCC)    Dyspnea    Edema of all four extremities    Essential (primary) hypertension 06/24/2020   Hyperlipemia    Type II diabetes mellitus (HCC) 03/22/2022   Past Surgical History:  Procedure Laterality Date   ABDOMINAL HYSTERECTOMY     ANTERIOR LAT LUMBAR FUSION N/A 11/05/2019   Procedure: Lumbar Four-Five Anterolateral lumbar interbody fusion with lateral plate;  Surgeon: Barnett Abu, MD;  Location: [facility] OR;  Service: Neurosurgery;  Laterality: N/A;  anterolateral   BACK SURGERY     pins   BACK SURGERY  07/11/2008   BIOPSY  06/29/2021   Procedure: BIOPSY;  Surgeon: Dolores Frame, MD;  Location: [facility];  Service: Gastroenterology;;   BLADDER REPAIR     CARPAL TUNNEL RELEASE Right    CESAREAN SECTION  1982, 1984   COLONOSCOPY N/A 06/12/2013   rehman: normal   CORONARY PRESSURE/FFR STUDY N/A 10/20/2022   Procedure: CORONARY PRESSURE/FFR STUDY;  Surgeon: [name], MD;  Location: [facility] INVASIVE CV LAB;  Service: Cardiovascular;   Laterality: N/A;   ESOPHAGOGASTRODUODENOSCOPY (EGD) WITH PROPOFOL N/A 06/29/2021   Procedure: ESOPHAGOGASTRODUODENOSCOPY (EGD) WITH PROPOFOL;  Surgeon: Dolores Frame, MD;  Location: [facility];  Service: Gastroenterology;  Laterality: N/A;  8:40   KNEE ARTHROSCOPY WITH MEDIAL MENISECTOMY Right 05/09/2019   Procedure: KNEE ARTHROSCOPY WITH MEDIAL MENISECTOMY AND LATERAL MENISECTOMY;  Surgeon: Vickki Hearing, MD;  Location: [facility] OR[facility];  Service: Orthopedics;  Laterality: Right;   KNEE ARTHROSCOPY WITH MEDIAL MENISECTOMY Left 06/27/2019   Procedure: KNEE ARTHROSCOPY WITH MEDIAL MENISCECTOMY;  Surgeon: Vickki Hearing, MD;  Location: [facility] OR[facility];  Service: Orthopedics;  Laterality: Left;   LEFT HEART CATH AND CORONARY ANGIOGRAPHY N/A 10/20/2022   Procedure: LEFT HEART CATH AND CORONARY ANGIOGRAPHY;  Surgeon: [name], MD;  Location: [facility] INVASIVE CV  LAB;  Service: Cardiovascular;  Laterality: N/A;   TOTAL KNEE ARTHROPLASTY Left 03/15/2022   Procedure: TOTAL KNEE ARTHROPLASTY;  Surgeon: Vickki Hearing, MD;  Location: [facility];  Service: Orthopedics;  Laterality: Left;    Family History  Problem Relation Age of Onset   Cancer Paternal Grandfather    Cancer Paternal Grandmother    Cancer Maternal Grandmother    Heart disease Father    Lung cancer Mother    Other Brother        gunshot   Colon cancer Other    Social History   Socioeconomic History   Marital status: Single    Spouse name: Not on file   Number of children: 2   Years of education: Not on file   Highest education level: Not on file  Occupational History   Occupation: Disabled  Tobacco Use   Smoking status: Former    Packs/day: 1.00    Years: 20.00    Additional pack years: 0.00    Total pack years: 20.00    Types: Cigarettes   Smokeless tobacco: Never   Tobacco comments:    trying to quit  Vaping Use   Vaping Use: Some days  Substance and Sexual Activity   Alcohol use: No    Alcohol/week: 0.0  standard drinks of alcohol   Drug use: No   Sexual activity: Not Currently    Birth control/protection: Surgical    Comment: hyst  Other Topics Concern   Not on file  Social History Narrative   ** Merged History Encounter **       Social Determinants of Health   Financial Resource Strain: Low Risk  (10/06/2022)   Overall Financial Resource Strain (CARDIA)    Difficulty of Paying Living Expenses: Not very hard  Food Insecurity: No Food Insecurity (10/06/2022)   Hunger Vital Sign    Worried About Running Out of Food in the Last Year: Never true    Ran Out of Food in the Last Year: Never true  Transportation Needs: No Transportation Needs (10/06/2022)   PRAPARE - Administrator, Civil Service (Medical): No    Lack of Transportation (Non-Medical): No  Physical Activity: Inactive (10/06/2022)   Exercise Vital Sign    Days of Exercise per Week: 0 days    Minutes of Exercise per Session: 0 min  Stress: No Stress Concern Present (10/06/2022)   [employer] of Occupational Health - Occupational Stress Questionnaire    Feeling of Stress : Not at all  Social Connections: Socially Isolated (10/06/2022)   Social Connection and Isolation Panel [NHANES]    Frequency of Communication with Friends and Family: Once a week    Frequency of Social Gatherings with Friends and Family: Once a week    Attends Religious Services: Never    Database administrator or Organizations: No    Attends Banker Meetings: Never    Marital Status: Divorced  Catering manager Violence: Not At Risk (10/06/2022)   Humiliation, Afraid, Rape, and Kick questionnaire    Fear of Current or Ex-Partner: No    Emotionally Abused: No    Physically Abused: No    Sexually Abused: No    SUBJECTIVE  Review of Systems Constitutional: Patient denies any unintentional weight loss or change in strength lntegumentary: Patient denies any rashes or pruritus Eyes: Patient denies dry eyes ENT: Patient  denies dry mouth Cardiovascular: Patient denies chest pain or syncope Respiratory: Patient denies shortness of breath Gastrointestinal: Patient denies  nausea, vomiting, constipation, or diarrhea Musculoskeletal: Patient denies muscle cramps or weakness Neurologic: Patient denies convulsions or seizures Psychiatric: Patient denies memory problems Allergic/Immunologic: Patient denies recent allergic reaction(s) Hematologic/Lymphatic: Patient denies bleeding tendencies Endocrine: Patient denies heat/cold intolerance  GU: As per HPI.  OBJECTIVE Vitals:   12/13/22 0824  BP: 108/72  Pulse: 73   There is no height or weight on file to calculate BMI.  Physical Examination Constitutional: No obvious distress; patient is non-toxic appearing Cardiovascular: No visible lower extremity edema. Respiratory: The patient does not have audible wheezing/stridor; respirations do not appear labored  Gastrointestinal: Abdomen non-distended Musculoskeletal: Normal ROM of UEs  Skin: No obvious rashes/open sores  Neurologic: CN 2-12 grossly intact Psychiatric: Answered questions appropriately with normal affect Hematologic/Lymphatic/Immunologic: No obvious bruises or sites of spontaneous bleeding  UA: negative PVR: 0 ml  ASSESSMENT Mixed stress and urge urinary incontinence - Plan: Urinalysis, Routine w reflex microscopic, BLADDER SCAN AMB NON-IMAGING  Urge incontinence - Plan: Urinalysis, Routine w reflex microscopic, BLADDER SCAN AMB NON-IMAGING, mirabegron ER (MYRBETRIQ) 25 MG TB24 tablet  Exposure of implanted urethral mesh, subsequent encounter  Lichen sclerosus et atrophicus  Urinary urgency - Plan: mirabegron ER (MYRBETRIQ) 25 MG TB24 tablet   1. OAB with urinary frequency, urgency, and uge incontinence. We discussed the symptoms of overactive bladder (OAB), which include urinary urgency, frequency, nocturia, with or without urge incontinence.   While we may not know the exact  etiology of OAB, several treatment options exist. We discussed possible contributing factors in her case include neurogenic risk factors (T2DM and spinal stenosis / prior back surgeries), diuretic use, and caffeine intake.   We discussed the following management options in detail including potential benefits, risks, and side effects: Behavioral therapy: Reducing fluid to 48 oz daily Decreasing bladder irritants (such as caffeine, acidic foods, spicy foods, alcohol) Urge suppression strategies Bladder retraining / timed voiding Double voiding Medication(s}: Anticholinergic medications: Prefer to avoid anticholinergic medications due to prior treatment failures with Oxybutynin and Vesicare along with side effects risks + patient age. Beta-3 adrenergic agonist medications: We discussed option to restart Myrbetriq, which she had some success with previously and which has recently become available as a generic medication. For beta-3 agonist medication, we discussed the potential side effect of elevated blood pressure which is more likely to occur in individuals with uncontrolled hypertension.   She elected to work on timed voiding every 2.5-3 hours during the day, minimizing caffeine intake, and to restart Myrbetriq at 25 mg daily. She was advised to continue working with her PCP to maintain good blood sugar control for her diabetes.   If symptoms fail to improve with standard therapeutic approach, may consider recommending office cystoscopy with Dr. Ronne Binning for further evaluation to evaluate for possible mesh erosion into the bladder as a possible irritative contributor towards her OAB-type symptoms.  2. Stress urinary incontinence. Well managed with previous placed mid-urethral sling.  Asymptomatic for previously documented mesh erosion into vagina.  Will plan for follow up in 8 weeks. Pt verbalized understanding and agreement. All questions were answered.  PLAN Advised the following: 1.  Minimize caffeine intake. 2. Work on timed voiding. 3. Start Myrbetriq 25 mg daily. 4. Maintain good blood sugar control. 5. Return in about 8 weeks (around 02/07/2023) for UA, PVR, & f/u with Evette Georges NP.  Orders Placed This Encounter  Procedures   Urinalysis, Routine w reflex microscopic   BLADDER SCAN AMB NON-IMAGING    It has been explained that the patient  is to follow regularly with their PCP in addition to all other providers involved in their care and to follow instructions provided by these respective offices. Patient advised to contact urology clinic if any urologic-pertaining questions, concerns, new symptoms or problems arise in the interim period.  Patient Instructions  Overactive bladder (OAB) overview for patients:  Symptoms may include: urinary urgency ("gotta go" feeling) urinary frequency (voiding >8 times per day) night time urination (nocturia) urge incontinence of urine (UUI)  While we do not know the exact etiology of OAB, several treatment options exist including:  Behavioral therapy: Reducing fluid intake Decreasing bladder stimulants (such as caffeine) and irritants (such as acidic food, spicy foods, alcohol) Urge suppression strategies Bladder retraining via timed voiding Pelvic floor physical therapy  Medication(s) - can use one or both of the drug classes below. Anticholinergic / antimuscarinic medications:  Mechanism of action: Activate M3 receptors to reduce detrusor stimulation and increase bladder capacity   (parasympathetic nervous system). Effect: Relaxes the bladder to decrease overactivity, increase bladder storage capacity, and increase time between voids. Onset: Slow acting (may take 8-12 weeks to determine efficacy). Medications include: Vesicare (Solifenacin), Ditropan (Oxybutynin), Detrol (Tolterodine), Toviaz (Fesoterodine), Sanctura (Trospium), Urispas (Flavoxate), Enablex (Darifenacin), Bentyl (Dicyclomine), Levsin (Hyoscyamine  ). Potential side effects include but are not limited to: Dry eyes, dry mouth, constipation, cognitive impairment, dementia risk with long term use, and urinary retention/ incomplete bladder emptying. Insurance companies generally prefer for patients to try 1-2 anticholinergic / antimuscarinic medications first due to low cost. Some exceptions are made based on patient-specific comorbidities / risk factors. Beta-3 agonist medications: Mechanism of action: Stimulates selective B3 adrenergic receptors to cause smooth muscle bladder relaxation (sympathetic nervous system). Effect: Relaxes the bladder to decrease overactivity, increase bladder storage capacity, and increase time between voids. Onset: Slow acting (may take 8-12 weeks to determine efficacy). Medications include: Myrbetriq (Mirabegron) and Vibegron Leslye Peer). Potential side effects include but are not limited to: urinary retention / incomplete bladder emptying and elevated blood pressure (more likely to occur in individuals with pre-existing uncontrolled hypertension). These medications tend to be more expensive than the anticholinergic / antimuscarinic medications.   For patients with refractory OAB (if the above treatment options have been unsuccessful): Posterior tibial nerve stimulation (PTNS). Small acupuncture-type needle inserted near ankle with electric current to stimulate bladder via posterior tibial nerve pathway. Initially requires 12 weekly in-office treatments lasting 30 minutes each; followed by monthly in-office treatments lasting 30 minutes each for 1 year.  Bladder Botox injections. How it is done: Typically done via in-office cystoscopy; sometimes done in the OR depending on the situation. The bladder is numbed with lidocaine instilled via a catheter. Then the urologist injects Botox into the bladder muscle wall in about 20 locations. Causes local paralysis of the bladder muscle at the injection sites to reduce bladder  muscle overactivity / spasms. The effect lasts for approximately 6 months and cannot be reversed once performed. Risks may included but are not limited to: infection, incomplete bladder emptying/ urinary retention, short term need for self-catheterization or indwelling catheter, and need for repeat therapy. There is a 5-12% chance of needing to catheterize with Botox - that usually resolves in a few months as the Botox wears off. Typically Botox injections would need to be repeated every 3-12 months since this is not a permanent therapy.  Sacral neuromodulation trial (Medtronic lnterStim or Axonics implant). Sacral neuromodulation is FDA-approved for uncontrolled urinary urgency, urinary frequency, urinary urge incontinence, non-obstructive urinary retention, or fecal  incontinence. It is not FDA-approved as a treatment for pain. The goal of this therapy is at least a 50% improvement in symptoms. It is NOT realistic to expect a 100% cure. This is a a 2-step outpatient procedure. After a successful test period, a permanent wire and generator are placed in the OR. We discussed the risk of infection. We reviewed the fact that about 30% of patients fail the test phase and are not candidates for permanent generator placement. During the 1-2 week trial phase, symptoms are documented by the patient to determine response. If patient gets at least a 50% improvement in symptoms, they may then proceed with Step 2. Step 1: Trial lead placement. Per physician discretion, may done one of two ways: Percutaneous nerve evaluation (PNE) in the [facility] urology office. Performed by urologist under local anesthesia (numbing the area with lidocaine) using a spinal needle for placement of test wire, which usually stays in place for 5-7 days to determine therapy response. Test lead placement in OR under anesthesia. Usually stays in place 2 weeks to determine therapy response. > Step 2: Permanent implantation of sacral  neuromodulation device, which is performed in the OR.  Sacral neuromodulation implants: All are conditionally MRI safe. Manufacturer: Medtronic Website: [employer]gl therapy/right-for-you.html Options: lnterStim X: Non-rechargeable. The battery lasts 10 years on average. lnterStim Micro: Rechargeable. The battery lasts 15 years on average and must be charged routinely. Approximately 50% smaller implant than lnterStim X implant.  Manufacturer: Axonics Website: Findrealrelief.axonics.com Options: Non-rechargeable (Axonics F15): The battery lasts 15 years on average. Rechargeable (Axonics R20): The battery lasts 20 years on average and must be charged in office for about 1 hour every 6-10 months on average. Approximately 50% smaller implant than Axonics non-rechargeable implant.  Note: Generally the rechargeable devices are only advised for very small or thin patients who may not have sufficient adipose tissue to comfortably overlay the implanted device.  Suprapubic catheter (SP tube) placement. Only done in severely refractory OAB when all other options have failed or are not a viable treatment choice depending on patient factors. Involves placement of a catheter through the lower abdomen into the bladder to continuously drain the bladder into an external collection bag, which patient can then empty at their convenience every few hours. Done via an outpatient surgical procedure in the OR under anesthesia. Risks may included but are not limited to: surgical site pain, infections, skin irritation / breakdown, chronic bacteriuria, symptomatic UTls. The SP tube must stay in place continuously. This is a reversible procedure however - the insertion site will close if catheter is removed for more than a few hours. The SP tube must be exchanged routinely every 4 weeks to prevent the catheter from becoming clogged with  sediment. SP tube exchanges are typically performed at a urology nurse visit or by a home health nurse.   Electronically signed by:  Donnita Falls, MSN, FNP-C, CUNP 12/13/2022 9:16 AM

## 2022-12-12 NOTE — Telephone Encounter (Signed)
Called patient, advised we should see her for clearance, scheduled to see Dr.Branch on Friday at 8:30 AM. Patient verbalized understanding.

## 2022-12-13 ENCOUNTER — Ambulatory Visit (INDEPENDENT_AMBULATORY_CARE_PROVIDER_SITE_OTHER): Payer: 59 | Admitting: Urology

## 2022-12-13 ENCOUNTER — Encounter: Payer: Self-pay | Admitting: Urology

## 2022-12-13 VITALS — BP 108/72 | HR 73

## 2022-12-13 DIAGNOSIS — N3946 Mixed incontinence: Secondary | ICD-10-CM | POA: Diagnosis not present

## 2022-12-13 DIAGNOSIS — L900 Lichen sclerosus et atrophicus: Secondary | ICD-10-CM

## 2022-12-13 DIAGNOSIS — R3915 Urgency of urination: Secondary | ICD-10-CM

## 2022-12-13 DIAGNOSIS — T83722D Exposure of implanted urethral mesh into urethra, subsequent encounter: Secondary | ICD-10-CM | POA: Diagnosis not present

## 2022-12-13 DIAGNOSIS — N3941 Urge incontinence: Secondary | ICD-10-CM

## 2022-12-13 LAB — URINALYSIS, ROUTINE W REFLEX MICROSCOPIC
Bilirubin, UA: NEGATIVE
Glucose, UA: NEGATIVE
Ketones, UA: NEGATIVE
Leukocytes,UA: NEGATIVE
Nitrite, UA: NEGATIVE
Protein,UA: NEGATIVE
RBC, UA: NEGATIVE
Specific Gravity, UA: <1.005 — ABNORMAL LOW (ref 1.005–1.030)
Urobilinogen, Ur: 0.2 mg/dL (ref 0.2–1.0)
pH, UA: 6.5 (ref 5.0–7.5)

## 2022-12-13 LAB — BLADDER SCAN AMB NON-IMAGING

## 2022-12-13 MED ORDER — MIRABEGRON ER 25 MG PO TB24
25.0000 mg | ORAL_TABLET | Freq: Every day | ORAL | 11 refills | Status: DC
Start: 2022-12-13 — End: 2023-02-07

## 2022-12-13 NOTE — Patient Instructions (Signed)
Overactive bladder (OAB) overview for patients:  Symptoms may include: urinary urgency ("gotta go" feeling) urinary frequency (voiding >8 times per day) night time urination (nocturia) urge incontinence of urine (UUI)  While we do not know the exact etiology of OAB, several treatment options exist including:  Behavioral therapy: Reducing fluid intake Decreasing bladder stimulants (such as caffeine) and irritants (such as acidic food, spicy foods, alcohol) Urge suppression strategies Bladder retraining via timed voiding Pelvic floor physical therapy  Medication(s) - can use one or both of the drug classes below. Anticholinergic / antimuscarinic medications:  Mechanism of action: Activate M3 receptors to reduce detrusor stimulation and increase bladder capacity  (parasympathetic nervous system). Effect: Relaxes the bladder to decrease overactivity, increase bladder storage capacity, and increase time between voids. Onset: Slow acting (may take 8-12 weeks to determine efficacy). Medications include: Vesicare (Solifenacin), Ditropan (Oxybutynin), Detrol (Tolterodine), Toviaz (Fesoterodine), Sanctura (Trospium), Urispas (Flavoxate), Enablex (Darifenacin), Bentyl (Dicyclomine), Levsin (Hyoscyamine ). Potential side effects include but are not limited to: Dry eyes, dry mouth, constipation, cognitive impairment, dementia risk with long term use, and urinary retention/ incomplete bladder emptying. Insurance companies generally prefer for patients to try 1-2 anticholinergic / antimuscarinic medications first due to low cost. Some exceptions are made based on patient-specific comorbidities / risk factors. Beta-3 agonist medications: Mechanism of action: Stimulates selective B3 adrenergic receptors to cause smooth muscle bladder relaxation (sympathetic nervous system). Effect: Relaxes the bladder to decrease overactivity, increase bladder storage capacity, and increase time between voids. Onset:  Slow acting (may take 8-12 weeks to determine efficacy). Medications include: Myrbetriq (Mirabegron) and Vibegron (Gemtesa). Potential side effects include but are not limited to: urinary retention / incomplete bladder emptying and elevated blood pressure (more likely to occur in individuals with pre-existing uncontrolled hypertension). These medications tend to be more expensive than the anticholinergic / antimuscarinic medications.   For patients with refractory OAB (if the above treatment options have been unsuccessful): Posterior tibial nerve stimulation (PTNS). Small acupuncture-type needle inserted near ankle with electric current to stimulate bladder via posterior tibial nerve pathway. Initially requires 12 weekly in-office treatments lasting 30 minutes each; followed by monthly in-office treatments lasting 30 minutes each for 1 year.  Bladder Botox injections. How it is done: Typically done via in-office cystoscopy; sometimes done in the OR depending on the situation. The bladder is numbed with lidocaine instilled via a catheter. Then the urologist injects Botox into the bladder muscle wall in about 20 locations. Causes local paralysis of the bladder muscle at the injection sites to reduce bladder muscle overactivity / spasms. The effect lasts for approximately 6 months and cannot be reversed once performed. Risks may included but are not limited to: infection, incomplete bladder emptying/ urinary retention, short term need for self-catheterization or indwelling catheter, and need for repeat therapy. There is a 5-12% chance of needing to catheterize with Botox - that usually resolves in a few months as the Botox wears off. Typically Botox injections would need to be repeated every 3-12 months since this is not a permanent therapy.  Sacral neuromodulation trial (Medtronic lnterStim or Axonics implant). Sacral neuromodulation is FDA-approved for uncontrolled urinary urgency, urinary frequency,  urinary urge incontinence, non-obstructive urinary retention, or fecal incontinence. It is not FDA-approved as a treatment for pain. The goal of this therapy is at least a 50% improvement in symptoms. It is NOT realistic to expect a 100% cure. This is a a 2-step outpatient procedure. After a successful test period, a permanent wire and generator are placed   in the OR. We discussed the risk of infection. We reviewed the fact that about 30% of patients fail the test phase and are not candidates for permanent generator placement. During the 1-2 week trial phase, symptoms are documented by the patient to determine response. If patient gets at least a 50% improvement in symptoms, they may then proceed with Step 2. Step 1: Trial lead placement. Per physician discretion, may done one of two ways: Percutaneous nerve evaluation (PNE) in the [facility] office. Performed by urologist under local anesthesia (numbing the area with lidocaine) using a spinal needle for placement of test wire, which usually stays in place for 5-7 days to determine therapy response. Test lead placement in OR under anesthesia. Usually stays in place 2 weeks to determine therapy response. > Step 2: Permanent implantation of sacral neuromodulation device, which is performed in the OR.  Sacral neuromodulation implants: All are conditionally MRI safe. Manufacturer: Medtronic Website: www.medtronic.com/[place]-en/patients/treatments-therapies/neurostimulator-overactive-bladder/getting therapy/right-for-you.html Options: lnterStim X: Non-rechargeable. The battery lasts 10 years on average. lnterStim Micro: Rechargeable. The battery lasts 15 years on average and must be charged routinely. Approximately 50% smaller implant than lnterStim X implant.  Manufacturer: Axonics Website: Findrealrelief.axonics.com Options: Non-rechargeable (Axonics F15): The battery lasts 15 years on average. Rechargeable (Axonics R20): The battery lasts 20 years on  average and must be charged in office for about 1 hour every 6-10 months on average. Approximately 50% smaller implant than Axonics non-rechargeable implant.  Note: Generally the rechargeable devices are only advised for very small or thin patients who may not have sufficient adipose tissue to comfortably overlay the implanted device.  Suprapubic catheter (SP tube) placement. Only done in severely refractory OAB when all other options have failed or are not a viable treatment choice depending on patient factors. Involves placement of a catheter through the lower abdomen into the bladder to continuously drain the bladder into an external collection bag, which patient can then empty at their convenience every few hours. Done via an outpatient surgical procedure in the OR under anesthesia. Risks may included but are not limited to: surgical site pain, infections, skin irritation / breakdown, chronic bacteriuria, symptomatic UTls. The SP tube must stay in place continuously. This is a reversible procedure however - the insertion site will close if catheter is removed for more than a few hours. The SP tube must be exchanged routinely every 4 weeks to prevent the catheter from becoming clogged with sediment. SP tube exchanges are typically performed at a urology nurse visit or by a home health nurse.  

## 2022-12-15 ENCOUNTER — Telehealth: Payer: Self-pay

## 2022-12-15 NOTE — Telephone Encounter (Signed)
Patient called advising that her insurance will not cover the below medication. She wanted to know if there was another option to help with her OAB.     Medication: mirabegron ER (MYRBETRIQ) 25 MG TB24 tablet      Thank you

## 2022-12-16 ENCOUNTER — Ambulatory Visit: Payer: 59 | Attending: Internal Medicine | Admitting: Internal Medicine

## 2022-12-16 ENCOUNTER — Encounter: Payer: Self-pay | Admitting: Internal Medicine

## 2022-12-16 VITALS — BP 118/60 | HR 68 | Ht 62.0 in | Wt 234.6 lb

## 2022-12-16 DIAGNOSIS — R0602 Shortness of breath: Secondary | ICD-10-CM | POA: Diagnosis not present

## 2022-12-16 DIAGNOSIS — Z01810 Encounter for preprocedural cardiovascular examination: Secondary | ICD-10-CM

## 2022-12-16 MED ORDER — FUROSEMIDE 40 MG PO TABS: 80.0000 mg | ORAL_TABLET | Freq: Every day | ORAL | 0 refills | Status: DC

## 2022-12-16 NOTE — Patient Instructions (Addendum)
Medication Instructions:  Your physician recommends that you continue on your current medications as directed. Please refer to the Current Medication list given to you today.  *If you need a refill on your cardiac medications before your next appointment, please call your pharmacy*   Lab Work: BNP TODAY BMET IN 2 WEEKS  If you have labs (blood work) drawn today and your tests are completely normal, you will receive your results only by: MyChart Message (if you have MyChart) OR A paper copy in the mail If you have any lab test that is abnormal or we need to change your treatment, we will call you to review the results.   Follow-Up: At [facility], you and your health needs are our priority.  As part of our continuing mission to provide you with exceptional heart care, we have created designated Provider Care Teams.  These Care Teams include your primary Cardiologist (physician) and Advanced Practice Providers (APPs -  Physician Assistants and Nurse Practitioners) who all work together to provide you with the care you need, when you need it.  We recommend signing up for the patient portal called "MyChart".  Sign up information is provided on this After Visit Summary.  MyChart is used to connect with patients for Virtual Visits (Telemedicine).  Patients are able to view lab/test results, encounter notes, upcoming appointments, etc.  Non-urgent messages can be sent to your provider as well.   To learn more about what you can do with MyChart, go to [email or web address].    Your next appointment:   1 month(s)  Provider:   Reatha Harps, MD

## 2022-12-16 NOTE — Progress Notes (Addendum)
Cardiology Office Note:    Date:  12/16/2022   ID:  [name], DOB April 04, 1961, MRN [id number]  PCP:  Rebekah Chesterfield, NP    [facility] Providers Cardiologist:  Reatha Harps, MD     Referring MD: Rebekah Chesterfield, NP   No chief complaint on file. PreOP  History of Present Illness:    [name] is a 62 y.o. female with a hx of CAD, well controlled DM2, HFpEF, tobacco abuse. She is here for an acute visit for a pre-op for R knee arthroplasty. Briefly, she has hx of  elevated CAC 800, 99th percentile s/p LHC with non obstructive dx. She is a patient of Dr. Marylene Buerger. She was volume up in early May and her lasix was increased to 40 for a few days. She was evaluated by orthopedic surgery on 12/09/2022 who was concerned about her VO.  She was 223 in April and now 240.  Wt Readings from Last 3 Encounters:  12/16/22 234 lb 9.6 oz (106.4 kg)  12/09/22 240 lb (108.9 kg)  11/18/22 235 lb 3.2 oz (106.7 kg)   Today her weight is 234. She is on 40 mg of lasix daily. No PND. No orthopnea. Notes has had leg swelling since her L knee replacement 03/15/2022. She can do about 15- 20 yards then she is SOB. This has not progressed. She can do a flight of stairs. No CP   Past Medical History:  Diagnosis Date   Anxiety    Arthritis    Asthma    Cancer (HCC)    cervical cancer; pt states a long long time ago   CHF (congestive heart failure) (HCC)    Coronary artery disease    Depression    Diabetes mellitus without complication (HCC)    Dyspnea    Edema of all four extremities    Essential (primary) hypertension 06/24/2020   Hyperlipemia    Type II diabetes mellitus (HCC) 03/22/2022    Past Surgical History:  Procedure Laterality Date   ABDOMINAL HYSTERECTOMY     ANTERIOR LAT LUMBAR FUSION N/A 11/05/2019   Procedure: Lumbar Four-Five Anterolateral lumbar interbody fusion with lateral plate;  Surgeon: Barnett Abu, MD;  Location: [facility] OR;  Service: Neurosurgery;   Laterality: N/A;  anterolateral   BACK SURGERY     pins   BACK SURGERY  07/11/2008   BIOPSY  06/29/2021   Procedure: BIOPSY;  Surgeon: Dolores Frame, MD;  Location: [facility];  Service: Gastroenterology;;   BLADDER REPAIR     CARPAL TUNNEL RELEASE Right    CESAREAN SECTION  1982, 1984   COLONOSCOPY N/A 06/12/2013   rehman: normal   CORONARY PRESSURE/FFR STUDY N/A 10/20/2022   Procedure: CORONARY PRESSURE/FFR STUDY;  Surgeon: [name], MD;  Location: [facility] IN[facility];  Service: Cardiovascular;  Laterality: N/A;   ESOPHAGOGASTRODUODENOSCOPY (EGD) WITH PROPOFOL N/A 06/29/2021   Procedure: ESOPHAGOGASTRODUODENOSCOPY (EGD) WITH PROPOFOL;  Surgeon: Dolores Frame, MD;  Location: [facility];  Service: Gastroenterology;  Laterality: N/A;  8:40   KNEE ARTHROSCOPY WITH MEDIAL MENISECTOMY Right 05/09/2019   Procedure: KNEE ARTHROSCOPY WITH MEDIAL MENISECTOMY AND LATERAL MENISECTOMY;  Surgeon: Vickki Hearing, MD;  Location: [facility] ORS;  Service: Orthopedics;  Laterality: Right;   KNEE ARTHROSCOPY WITH MEDIAL MENISECTOMY Left 06/27/2019   Procedure: KNEE ARTHROSCOPY WITH MEDIAL MENISCECTOMY;  Surgeon: Vickki Hearing, MD;  Location: [facility];  Service: Orthopedics;  Laterality: Left;   LEFT HEART CATH AND CORONARY  ANGIOGRAPHY N/A 10/20/2022   Procedure: LEFT HEART CATH AND CORONARY ANGIOGRAPHY;  Surgeon: [name], MD;  Location: [facility];  Service: Cardiovascular;  Laterality: N/A;   TOTAL KNEE ARTHROPLASTY Left 03/15/2022   Procedure: TOTAL KNEE ARTHROPLASTY;  Surgeon: Vickki Hearing, MD;  Location: [facility];  Service: Orthopedics;  Laterality: Left;    Current Medications: Current Outpatient Medications on File Prior to Visit  Medication Sig Dispense Refill   albuterol (VENTOLIN HFA) 108 (90 Base) MCG/ACT inhaler Inhale 1-2 puffs into the lungs every 6 (six) hours as needed for wheezing or shortness of breath.     ARIPiprazole (ABILIFY) 2  MG tablet Take 2 mg by mouth daily.     aspirin EC 81 MG tablet Take 81 mg by mouth daily. Swallow whole.     atorvastatin (LIPITOR) 80 MG tablet Take 1 tablet (80 mg total) by mouth daily. 90 tablet 3   bumetanide (BUMEX) 1 MG tablet Take 1 mg by mouth daily.     estradiol (ESTRACE) 2 MG tablet TAKE ONE TABLET AT BEDTIME 30 tablet 11   FLUoxetine (PROZAC) 40 MG capsule Take 40 mg by mouth daily.     gabapentin (NEURONTIN) 300 MG capsule Take 1 capsule (300 mg total) by mouth 3 (three) times daily. 21 capsule 3   loratadine (CLARITIN) 10 MG tablet Take 10 mg by mouth daily as needed for allergies.     metFORMIN (GLUCOPHAGE) 500 MG tablet Take 500 mg by mouth daily with breakfast.     metoprolol tartrate (LOPRESSOR) 25 MG tablet Take 1 tablet (25 mg total) by mouth 2 (two) times daily. 180 tablet 3   mirabegron ER (MYRBETRIQ) 25 MG TB24 tablet Take 1 tablet (25 mg total) by mouth daily. 30 tablet 11   nitroGLYCERIN (NITROSTAT) 0.4 MG SL tablet Place 1 tablet (0.4 mg total) under the tongue every 5 (five) minutes as needed for chest pain. 25 tablet 3   omeprazole (PRILOSEC) 40 MG capsule Take 1 capsule (40 mg total) by mouth daily. 90 capsule 3   oxyCODONE-acetaminophen (PERCOCET) 7.5-325 MG tablet Take 1 tablet by mouth 4 (four) times daily.     oxymetazoline (AFRIN) 0.05 % nasal spray Place 1 spray into both nostrils 2 (two) times daily as needed for congestion.     potassium chloride SA (KLOR-CON M20) 20 MEQ tablet Take 1 tablet (20 mEq total) by mouth daily. 90 tablet 3   spironolactone (ALDACTONE) 25 MG tablet Take 1 tablet (25 mg total) by mouth daily. 90 tablet 3   traZODone (DESYREL) 100 MG tablet Take 200 mg by mouth at bedtime.     Current Facility-Administered Medications on File Prior to Visit  Medication Dose Route Frequency Provider Last Rate Last Admin   bupivacaine-meloxicam ER (ZYNRELEF) injection 400 mg  400 mg Infiltration Once Vickki Hearing, MD         Allergies:    Patient has no known allergies.   Social History   Socioeconomic History   Marital status: Single    Spouse name: Not on file   Number of children: 2   Years of education: Not on file   Highest education level: Not on file  Occupational History   Occupation: Disabled  Tobacco Use   Smoking status: Former    Packs/day: 1.00    Years: 20.00    Additional pack years: 0.00    Total pack years: 20.00    Types: Cigarettes   Smokeless tobacco: Never  Tobacco comments:    trying to quit  Vaping Use   Vaping Use: Some days  Substance and Sexual Activity   Alcohol use: No    Alcohol/week: 0.0 standard drinks of alcohol   Drug use: No   Sexual activity: Not Currently    Birth control/protection: Surgical    Comment: hyst  Other Topics Concern   Not on file  Social History Narrative   ** Merged History Encounter **       Social Determinants of Health   Financial Resource Strain: Low Risk  (10/06/2022)   Overall Financial Resource Strain (CARDIA)    Difficulty of Paying Living Expenses: Not very hard  Food Insecurity: No Food Insecurity (10/06/2022)   Hunger Vital Sign    Worried About Running Out of Food in the Last Year: Never true    Ran Out of Food in the Last Year: Never true  Transportation Needs: No Transportation Needs (10/06/2022)   PRAPARE - Administrator, Civil Service (Medical): No    Lack of Transportation (Non-Medical): No  Physical Activity: Inactive (10/06/2022)   Exercise Vital Sign    Days of Exercise per Week: 0 days    Minutes of Exercise per Session: 0 min  Stress: No Stress Concern Present (10/06/2022)   [employer] of Occupational Health - Occupational Stress Questionnaire    Feeling of Stress : Not at all  Social Connections: Socially Isolated (10/06/2022)   Social Connection and Isolation Panel [NHANES]    Frequency of Communication with Friends and Family: Once a week    Frequency of Social Gatherings with Friends and Family: Once  a week    Attends Religious Services: Never    Database administrator or Organizations: No    Attends Engineer, structural: Never    Marital Status: Divorced     Family History: The patient's family history includes Cancer in her maternal grandmother, paternal grandfather, and paternal grandmother; Colon cancer in an other family member; Heart disease in her father; Lung cancer in her mother; Other in her brother.  ROS:   Please see the history of present illness.     All other systems reviewed and are negative.  EKGs/Labs/Other Studies Reviewed:    The following studies were reviewed today:   EKG:  EKG is  ordered today.  The ekg ordered today demonstrates   12/16/2022- NSR, poor r wave progression  Recent Labs: 10/17/2022: BUN 6; Creatinine, Ser 0.67; Hemoglobin 10.5; Platelets 293; Potassium 4.5; Sodium 140   Recent Lipid Panel    Component Value Date/Time   CHOL 127 10/17/2022 1321   TRIG 125 10/17/2022 1321   HDL 44 10/17/2022 1321   CHOLHDL 2.9 10/17/2022 1321   LDLCALC 61 10/17/2022 1321     Risk Assessment/Calculations:         Physical Exam:    VS:  Vitals:   12/16/22 0819  BP: 118/60  Pulse: 68  SpO2: 94%     Wt Readings from Last 3 Encounters:  12/16/22 234 lb 9.6 oz (106.4 kg)  12/09/22 240 lb (108.9 kg)  11/18/22 235 lb 3.2 oz (106.7 kg)     GEN:  Well nourished, well developed in no acute distress HEENT: Normal NECK: No JVD; No carotid bruits LYMPHATICS: No lymphadenopathy CARDIAC: RRR, no murmurs, rubs, gallops RESPIRATORY:  Clear to auscultation without rales, wheezing or rhonchi  ABDOMEN: Soft, non-tender, non-distended MUSCULOSKELETAL:  trace to 1+ edema; No deformity  SKIN: Warm and dry NEUROLOGIC:  Alert and oriented x 3 PSYCHIATRIC:  Normal affect   ASSESSMENT:   Acute Issues addressed: HFpEF/PreOp She is clinically not volume overloaded. No JVD, she can lie flat comfortably. Will get BNP.  She still has mild edema in  ankles, likely lymphedema. Notes compression stockings don't fit. I will increase her lasix to help with this. Otherwise, she can do >4 METS. Do not recommend further cardiac w/u. She is acceptable cardiac risk for knee arthroscopy  PLAN:    In order of problems listed above:  BNP, BMET 2 weeks Increase lasix 40 mg to 80 mg daily Acceptable cardiac risk for R knee arthroscopy or total knee replacement      Medication Adjustments/Labs and Tests Ordered: Current medicines are reviewed at length with the patient today.  Concerns regarding medicines are outlined above.  Orders Placed This Encounter  Procedures   Brain natriuretic peptide   Basic metabolic panel   EKG 12-Lead   Meds ordered this encounter  Medications   furosemide (LASIX) 40 MG tablet    Sig: Take 2 tablets (80 mg total) by mouth daily.    Dispense:  180 tablet    Refill:  0    Patient Instructions  Medication Instructions:  Your physician recommends that you continue on your current medications as directed. Please refer to the Current Medication list given to you today.  *If you need a refill on your cardiac medications before your next appointment, please call your pharmacy*   Lab Work: BNP TODAY BMET IN 2 WEEKS  If you have labs (blood work) drawn today and your tests are completely normal, you will receive your results only by: MyChart Message (if you have MyChart) OR A paper copy in the mail If you have any lab test that is abnormal or we need to change your treatment, we will call you to review the results.   Follow-Up: At [facility], you and your health needs are our priority.  As part of our continuing mission to provide you with exceptional heart care, we have created designated Provider Care Teams.  These Care Teams include your primary Cardiologist (physician) and Advanced Practice Providers (APPs -  Physician Assistants and Nurse Practitioners) who all work together to provide you with  the care you need, when you need it.  We recommend signing up for the patient portal called "MyChart".  Sign up information is provided on this After Visit Summary.  MyChart is used to connect with patients for Virtual Visits (Telemedicine).  Patients are able to view lab/test results, encounter notes, upcoming appointments, etc.  Non-urgent messages can be sent to your provider as well.   To learn more about what you can do with MyChart, go to [email or web address].    Your next appointment:   1 month(s)  Provider:   Reatha Harps, MD         Signed, Maisie Fus, MD  12/16/2022 9:15 AM    [facility]

## 2022-12-16 NOTE — Addendum Note (Signed)
Addended by: Shon Hale on: 12/16/2022 10:47 AM   Modules accepted: Orders

## 2022-12-16 NOTE — Telephone Encounter (Signed)
We can offer patient samples if we have some in office until we are able to complete PA.

## 2022-12-17 LAB — BRAIN NATRIURETIC PEPTIDE: BNP: 21.8 pg/mL (ref 0.0–100.0)

## 2022-12-19 NOTE — Telephone Encounter (Signed)
Patient aware samples are at front desk for her to pick up, we will work on [name]

## 2022-12-19 NOTE — Telephone Encounter (Signed)
PA completed and per insurance Myrbetriq is covered under her plan and PA is not needed.  Please see under media tab.  Can a generic rx be sent in for Myrbetriq now?

## 2023-01-03 ENCOUNTER — Other Ambulatory Visit: Payer: Self-pay | Admitting: Obstetrics & Gynecology

## 2023-01-09 ENCOUNTER — Encounter: Payer: Self-pay | Admitting: Orthopedic Surgery

## 2023-01-09 ENCOUNTER — Telehealth: Payer: Self-pay | Admitting: Internal Medicine

## 2023-01-09 ENCOUNTER — Ambulatory Visit (INDEPENDENT_AMBULATORY_CARE_PROVIDER_SITE_OTHER): Payer: 59 | Admitting: Orthopedic Surgery

## 2023-01-09 VITALS — Ht 64.0 in | Wt 238.0 lb

## 2023-01-09 DIAGNOSIS — M1711 Unilateral primary osteoarthritis, right knee: Secondary | ICD-10-CM

## 2023-01-09 DIAGNOSIS — R0602 Shortness of breath: Secondary | ICD-10-CM

## 2023-01-09 NOTE — Progress Notes (Signed)
Office Visit Note   Patient: [name]           Date of Birth: [birth year]           MRN: [id number] Visit Date: 01/09/2023 Requested by: Rebekah Chesterfield, NP [address] [place],  [place] [address] PCP: Rebekah Chesterfield, NP   Assessment & Plan:   Nasia has seen cardiology.  They have cleared her for arthroscopy think was just a dictation mistake.  In any event I asked the doctor to correct the record to show that she is cleared for knee arthroplasty  If they change the note then we can schedule her for a right total knee arthroplasty  No orders of the defined types were placed in this encounter.    Subjective: Chief Complaint  Patient presents with   Knee Pain    Right knee pain wants to talk surgery stopped smoking and loss some weight     HPI: 62 year old female status post left total knee presents with chronic right knee pain which is failed nonoperative treatment she has had a bout of congestive heart failure has been seen by cardiology and cleared for surgery pending note to reflect arthroplasty versus arthroscopy              ROS: Occasional shortness of breath seems to be fluid related fluid dynamics seem to be corrected   Images personally read and my interpretation : Chronic arthritis of the knee  Visit Diagnoses:  1. Unilateral primary osteoarthritis, right knee      Follow-Up Instructions: Return surgery planned.    Objective: Vital Signs: Ht 5\' 4"  (1.626 m)   Wt 238 lb (108 kg)   BMI 40.85 kg/m   Physical Exam Vitals and nursing note reviewed.  Constitutional:      Appearance: Normal appearance.  HENT:     Head: Normocephalic and atraumatic.  Eyes:     General: No scleral icterus.       Right eye: No discharge.        Left eye: No discharge.     Extraocular Movements: Extraocular movements intact.     Conjunctiva/sclera: Conjunctivae normal.     Pupils: Pupils are equal, round, and reactive to light.  Cardiovascular:     Rate  and Rhythm: Normal rate.     Pulses: Normal pulses.  Skin:    General: Skin is warm and dry.     Capillary Refill: Capillary refill takes less than 2 seconds.  Neurological:     General: No focal deficit present.     Mental Status: She is alert and oriented to person, place, and time.  Psychiatric:        Mood and Affect: Mood normal.        Behavior: Behavior normal.        Thought Content: Thought content normal.        Judgment: Judgment normal.      Ortho Exam  Left knee Status post left total knee Range of motion 3-1 20 No swelling No instability Good quad control Ambulates well  She does complain that the knee gets stiff.  Right knee Normal skin envelope Global tenderness around the joint No effusion Flexion limited to 90 degrees 5 degree extension deficit Good quad control   Specialty Comments:  No specialty comments available.  Imaging: No results found.   PMFS History: Patient Active Problem List   Diagnosis Date Noted   Lesion of vulva 10/06/2022  Lichen sclerosus et atrophicus 10/06/2022   S/P hysterectomy with oophorectomy 10/06/2022   Hot flashes 10/06/2022   Current use of estrogen therapy 10/06/2022   Anxiety 03/22/2022   Asthma 03/22/2022   Bipolar 1 disorder (HCC) 03/22/2022   Chronic pain 03/22/2022   Edema 03/22/2022   Gastro-esophageal reflux 03/22/2022   Hyperlipidemia 03/22/2022   Insomnia 03/22/2022   Menopausal symptom 03/22/2022   Sinusitis 03/22/2022   Type II diabetes mellitus (HCC) 03/22/2022   Urinary incontinence 03/22/2022   Osteoarthritis of left knee 03/15/2022   History of operative procedure on lumbosacral spinal structure 02/15/2022   Urge incontinence 09/02/2021   Cervical radiculopathy 12/30/2020   Pseudoarthrosis of lumbar spine 08/25/2020   Essential (primary) hypertension 06/24/2020   Body mass index (BMI) 45.0-49.9, adult (HCC) 05/06/2020   Carpal tunnel syndrome 04/01/2020   Spondylolisthesis, lumbar  region 11/22/2019   Localized swelling of both lower legs 11/12/2019   Lumbar stenosis 11/05/2019   S/P left knee arthroscopy 06/27/2019 07/01/2019   Derangement of posterior horn of medial meniscus of left knee    Primary osteoarthritis of left knee    S/P right knee arthroscopy 05/09/19 05/29/2019   Depression 07/24/2014   LOWER LEG, ARTHRITIS, DEGEN./OSTEO 02/18/2009   DERANGEMENT MENISCUS 02/18/2009   ANSERINE BURSITIS, RIGHT 02/18/2009   Past Medical History:  Diagnosis Date   Anxiety    Arthritis    Asthma    Cancer (HCC)    cervical cancer; pt states a long long time ago   CHF (congestive heart failure) (HCC)    Coronary artery disease    Depression    Diabetes mellitus without complication (HCC)    Dyspnea    Edema of all four extremities    Essential (primary) hypertension 06/24/2020   Hyperlipemia    Type II diabetes mellitus (HCC) 03/22/2022    Family History  Problem Relation Age of Onset   Cancer Paternal Grandfather    Cancer Paternal Grandmother    Cancer Maternal Grandmother    Heart disease Father    Lung cancer Mother    Other Brother        gunshot   Colon cancer Other     Past Surgical History:  Procedure Laterality Date   ABDOMINAL HYSTERECTOMY     ANTERIOR LAT LUMBAR FUSION N/A 11/05/2019   Procedure: Lumbar Four-Five Anterolateral lumbar interbody fusion with lateral plate;  Surgeon: Barnett Abu, MD;  [facility]: [facility];  Service: Neurosurgery;  Laterality: N/A;  anterolateral   BACK SURGERY     pins   BACK SURGERY  07/11/2008   BIOPSY  06/29/2021   Procedure: BIOPSY;  Surgeon: Dolores Frame, MD;  [facility]: [facility];  Service: Gastroenterology;;   BLADDER REPAIR     CARPAL TUNNEL RELEASE Right    CESAREAN SECTION  1982, 1984   COLONOSCOPY N/A 06/12/2013   rehman: normal   CORONARY PRESSURE/FFR STUDY N/A 10/20/2022   Procedure: CORONARY PRESSURE/FFR STUDY;  Surgeon: [name], MD;  [facility];   Service: Cardiovascular;  Laterality: N/A;   ESOPHAGOGASTRODUODENOSCOPY (EGD) WITH PROPOFOL N/A 06/29/2021   Procedure: ESOPHAGOGASTRODUODENOSCOPY (EGD) WITH PROPOFOL;  Surgeon: Dolores Frame, MD;  [facility];  Service: Gastroenterology;  Laterality: N/A;  8:40   KNEE ARTHROSCOPY WITH MEDIAL MENISECTOMY Right 05/09/2019   Procedure: KNEE ARTHROSCOPY WITH MEDIAL MENISECTOMY AND LATERAL MENISECTOMY;  Surgeon: Vickki Hearing, MD;  Location: [facility];  Service: Orthopedics;  Laterality: Right;   KNEE ARTHROSCOPY WITH MEDIAL  MENISECTOMY Left 06/27/2019   Procedure: KNEE ARTHROSCOPY WITH MEDIAL MENISCECTOMY;  Surgeon: Vickki Hearing, MD;  Location: [facility];  Service: Orthopedics;  Laterality: Left;   LEFT HEART CATH AND CORONARY ANGIOGRAPHY N/A 10/20/2022   Procedure: LEFT HEART CATH AND CORONARY ANGIOGRAPHY;  Surgeon: [name], MD;  [facility];  Service: Cardiovascular;  Laterality: N/A;   TOTAL KNEE ARTHROPLASTY Left 03/15/2022   Procedure: TOTAL KNEE ARTHROPLASTY;  Surgeon: Vickki Hearing, MD;  Location: [facility];  Service: Orthopedics;  Laterality: Left;   Social History   Occupational History   Occupation: Disabled  Tobacco Use   Smoking status: Former    Packs/day: 1.00    Years: 20.00    Additional pack years: 0.00    Total pack years: 20.00    Types: Cigarettes   Smokeless tobacco: Never   Tobacco comments:    trying to quit  Vaping Use   Vaping Use: Some days  Substance and Sexual Activity   Alcohol use: No    Alcohol/week: 0.0 standard drinks of alcohol   Drug use: No   Sexual activity: Not Currently    Birth control/protection: Surgical    Comment: hyst

## 2023-01-09 NOTE — Telephone Encounter (Signed)
Patient states she wants her lab done at another [facility].  Advised that she may go to any [facility] and that her lab has been released to be seen in the chart

## 2023-01-09 NOTE — Telephone Encounter (Signed)
New message:      Patient says she wants to have her lab work at [facility] at [facility] please. nurse

## 2023-01-13 NOTE — Progress Notes (Signed)
Cardiology Office Note:   Date:  01/19/2023  NAME:  [name]    MRN: [id number] DOB:  09-16-1960   PCP:  Rebekah Chesterfield, NP  Cardiologist:  Reatha Harps, MD  Electrophysiologist:  None   Referring MD: Vickki Hearing, MD   Chief Complaint  Patient presents with   Follow-up    History of Present Illness:   [name] is a 62 y.o. female with a hx of non-obstructive CAD, HFpEF, HTN, DM who presents for follow-up.  She reports she is doing well.  Has some lower extremity edema but things are improved.  She reports her breathing is improved as well.  We did discuss transitioning to torsemide.  I think this will give her better effect.  She reports heavy activity can get her winded.  Reports infrequent episodes of chest discomfort.  Cholesterol levels are at goal.  Nonobstructive CAD on cath.  She appears fairly euvolemic today.  No worsening symptoms of heart failure.  Weights are stable.  She needs right knee surgery.  I will forward my note today to Dr. Romeo Apple.  Problem List CAD -non-obstructive CAD LHC 10/20/2022 -CAC score 800 (99th percentile) -TPV 856 mm3 (calcified 231 mm3; non-calcified plaque 625 mm3) -T chol 127, HDL 44, LDL 61, TG 125 2. HFpEF -LVEDP 32 LHC 10/20/2022 3. DM -A1c 6.8 4. HLD 5. Tobacco abuse   Past Medical History: Past Medical History:  Diagnosis Date   Anxiety    Arthritis    Asthma    Cancer (HCC)    cervical cancer; pt states a long long time ago   CHF (congestive heart failure) (HCC)    Coronary artery disease    Depression    Diabetes mellitus without complication (HCC)    Dyspnea    Edema of all four extremities    Essential (primary) hypertension 06/24/2020   Hyperlipemia    Type II diabetes mellitus (HCC) 03/22/2022    Past Surgical History: Past Surgical History:  Procedure Laterality Date   ABDOMINAL HYSTERECTOMY     ANTERIOR LAT LUMBAR FUSION N/A 11/05/2019   Procedure: Lumbar Four-Five Anterolateral  lumbar interbody fusion with lateral plate;  Surgeon: Barnett Abu, MD;  [facility];  Service: Neurosurgery;  Laterality: N/A;  anterolateral   BACK SURGERY     pins   BACK SURGERY  07/11/2008   BIOPSY  06/29/2021   Procedure: BIOPSY;  Surgeon: Dolores Frame, MD;  [facility];  Service: Gastroenterology;;   BLADDER REPAIR     CARPAL TUNNEL RELEASE Right    CESAREAN SECTION  1982, 1984   COLONOSCOPY N/A 06/12/2013   rehman: normal   CORONARY PRESSURE/FFR STUDY N/A 10/20/2022   Procedure: CORONARY PRESSURE/FFR STUDY;  Surgeon: [name], MD;  [facility];  Service: Cardiovascular;  Laterality: N/A;   ESOPHAGOGASTRODUODENOSCOPY (EGD) WITH PROPOFOL N/A 06/29/2021   Procedure: ESOPHAGOGASTRODUODENOSCOPY (EGD) WITH PROPOFOL;  Surgeon: Dolores Frame, MD;  [facility];  Service: Gastroenterology;  Laterality: N/A;  8:40   KNEE ARTHROSCOPY WITH MEDIAL MENISECTOMY Right 05/09/2019   Procedure: KNEE ARTHROSCOPY WITH MEDIAL MENISECTOMY AND LATERAL MENISECTOMY;  Surgeon: Vickki Hearing, MD;  Location: [facility];  Service: Orthopedics;  Laterality: Right;   KNEE ARTHROSCOPY WITH MEDIAL MENISECTOMY Left 06/27/2019   Procedure: KNEE ARTHROSCOPY WITH MEDIAL MENISCECTOMY;  Surgeon: Vickki Hearing, MD;  Location: [facility];  Service: Orthopedics;  Laterality: Left;   LEFT HEART CATH AND CORONARY ANGIOGRAPHY  N/A 10/20/2022   Procedure: LEFT HEART CATH AND CORONARY ANGIOGRAPHY;  Surgeon: [name], MD;  [facility];  Service: Cardiovascular;  Laterality: N/A;   TOTAL KNEE ARTHROPLASTY Left 03/15/2022   Procedure: TOTAL KNEE ARTHROPLASTY;  Surgeon: Vickki Hearing, MD;  Location: [facility];  Service: Orthopedics;  Laterality: Left;    Current Medications: Current Meds  Medication Sig   albuterol (VENTOLIN HFA) 108 (90 Base) MCG/ACT inhaler Inhale 1-2 puffs into the lungs every 6 (six) hours as needed for  wheezing or shortness of breath.   ARIPiprazole (ABILIFY) 2 MG tablet Take 2 mg by mouth daily.   aspirin EC 81 MG tablet Take 81 mg by mouth daily. Swallow whole.   atorvastatin (LIPITOR) 80 MG tablet Take 1 tablet (80 mg total) by mouth daily.   estradiol (ESTRACE) 2 MG tablet TAKE ONE TABLET AT BEDTIME   FLUoxetine (PROZAC) 40 MG capsule Take 40 mg by mouth daily.   gabapentin (NEURONTIN) 300 MG capsule Take 1 capsule (300 mg total) by mouth 3 (three) times daily.   loratadine (CLARITIN) 10 MG tablet Take 10 mg by mouth daily as needed for allergies.   metFORMIN (GLUCOPHAGE) 500 MG tablet Take 500 mg by mouth daily with breakfast.   metoprolol tartrate (LOPRESSOR) 25 MG tablet Take 1 tablet (25 mg total) by mouth 2 (two) times daily.   mirabegron ER (MYRBETRIQ) 25 MG TB24 tablet Take 1 tablet (25 mg total) by mouth daily.   omeprazole (PRILOSEC) 40 MG capsule Take 1 capsule (40 mg total) by mouth daily.   oxyCODONE-acetaminophen (PERCOCET) 7.5-325 MG tablet Take 1 tablet by mouth 4 (four) times daily.   oxymetazoline (AFRIN) 0.05 % nasal spray Place 1 spray into both nostrils 2 (two) times daily as needed for congestion.   potassium chloride SA (KLOR-CON M20) 20 MEQ tablet Take 1 tablet (20 mEq total) by mouth daily.   spironolactone (ALDACTONE) 25 MG tablet Take 1 tablet (25 mg total) by mouth daily.   traZODone (DESYREL) 100 MG tablet Take 200 mg by mouth at bedtime.   [DISCONTINUED] bumetanide (BUMEX) 1 MG tablet Take 1 mg by mouth daily.   [DISCONTINUED] furosemide (LASIX) 40 MG tablet Take 2 tablets (80 mg total) by mouth daily.   [DISCONTINUED] torsemide (DEMADEX) 20 MG tablet Take 2 tablets (40 mg total) by mouth daily.   Current Facility-Administered Medications for the 01/19/23 encounter (Office Visit) with Sande Rives, MD  Medication   bupivacaine-meloxicam ER (ZYNRELEF) injection 400 mg     Allergies:    Patient has no known allergies.   Social History: Social  History   Socioeconomic History   Marital status: Single    Spouse name: Not on file   Number of children: 2   Years of education: Not on file   Highest education level: Not on file  Occupational History   Occupation: Disabled  Tobacco Use   Smoking status: Former    Current packs/day: 1.00    Average packs/day: 1 pack/day for 20.0 years (20.0 ttl pk-yrs)    Types: Cigarettes   Smokeless tobacco: Never   Tobacco comments:    trying to quit  Vaping Use   Vaping status: Some Days  Substance and Sexual Activity   Alcohol use: No    Alcohol/week: 0.0 standard drinks of alcohol   Drug use: No   Sexual activity: Not Currently    Birth control/protection: Surgical    Comment: hyst  Other Topics Concern  Not on file  Social History Narrative   ** Merged History Encounter **       Social Determinants of Health   Financial Resource Strain: Low Risk  (10/06/2022)   Overall Financial Resource Strain (CARDIA)    Difficulty of Paying Living Expenses: Not very hard  Food Insecurity: No Food Insecurity (10/06/2022)   Hunger Vital Sign    Worried About Running Out of Food in the Last Year: Never true    Ran Out of Food in the Last Year: Never true  Transportation Needs: No Transportation Needs (10/06/2022)   PRAPARE - Administrator, Civil Service (Medical): No    Lack of Transportation (Non-Medical): No  Physical Activity: Inactive (10/06/2022)   Exercise Vital Sign    Days of Exercise per Week: 0 days    Minutes of Exercise per Session: 0 min  Stress: No Stress Concern Present (10/06/2022)   [employer] of Occupational Health - Occupational Stress Questionnaire    Feeling of Stress : Not at all  Social Connections: Socially Isolated (10/06/2022)   Social Connection and Isolation Panel [NHANES]    Frequency of Communication with Friends and Family: Once a week    Frequency of Social Gatherings with Friends and Family: Once a week    Attends Religious Services:  Never    Database administrator or Organizations: No    Attends Engineer, structural: Never    Marital Status: Divorced     Family History: The patient's family history includes Cancer in her maternal grandmother, paternal grandfather, and paternal grandmother; Colon cancer in an other family member; Heart disease in her father; Lung cancer in her mother; Other in her brother.  ROS:   All other ROS reviewed and negative. Pertinent positives noted in the HPI.     EKGs/Labs/Other Studies Reviewed:   The following studies were personally reviewed by me today:  EKG:  EKG is not ordered today.        LHC 10/20/2022    Mid LAD lesion is 40% stenosed.   1st Mrg lesion is 25% stenosed.   1.  Mild obstructive coronary artery disease with RFR of 1.0 in the LAD and RFR of 0.97 in the left circumflex; RFR of the right coronary artery was not pursued given patient intolerance of adenosine for coronary microvascular dysfunction assessment (see procedural notes). 2.  Index of microvascular resistance of 13 with normal being less than 25 and coronary flow reserve of 1.9 with normal being greater than 2.0. 3.  Severely elevated LVEDP of 32 mmHg.  Recent Labs: 10/17/2022: Hemoglobin 10.5; Platelets 293 12/16/2022: BNP 21.8 01/13/2023: BUN 7; Creatinine, Ser 0.91; Potassium 3.9; Sodium 141   Recent Lipid Panel    Component Value Date/Time   CHOL 127 10/17/2022 1321   TRIG 125 10/17/2022 1321   HDL 44 10/17/2022 1321   CHOLHDL 2.9 10/17/2022 1321   LDLCALC 61 10/17/2022 1321    Physical Exam:   VS:  BP 118/72   Pulse 76   Ht 5\' 4"  (1.626 m)   Wt 236 lb 12.8 oz (107.4 kg)   SpO2 92%   BMI 40.65 kg/m    Wt Readings from Last 3 Encounters:  01/19/23 236 lb 12.8 oz (107.4 kg)  01/09/23 238 lb (108 kg)  12/16/22 234 lb 9.6 oz (106.4 kg)    General: Well nourished, well developed, in no acute distress Head: Atraumatic, normal size  Eyes: PEERLA, EOMI  Neck: Supple, no  JVD  Endocrine: No thryomegaly Cardiac: Normal S1, S2; RRR; no murmurs, rubs, or gallops Lungs: Clear to auscultation bilaterally, no wheezing, rhonchi or rales  Abd: Soft, nontender, no hepatomegaly  Ext: Trace edema Musculoskeletal: No deformities, BUE and BLE strength normal and equal Skin: Warm and dry, no rashes   Neuro: Alert and oriented to person, place, time, and situation, CNII-XII grossly intact, no focal deficits  Psych: Normal mood and affect   ASSESSMENT:   [name] is a 62 y.o. female who presents for the following: 1. Chronic diastolic heart failure (HCC)   2. Coronary artery disease involving native coronary artery of native heart without angina pectoris   3. Mixed hyperlipidemia   4. Tobacco abuse   5. Preoperative cardiovascular examination     PLAN:   1. Chronic diastolic heart failure (HCC) -Fully euvolemic on exam today.  We will transition her to torsemide 40 mg daily.  She is not taking Bumex.  She will continue with potassium 20 mill equivalents daily as well.  She is on Aldactone 25 mg daily.  Blood pressure is at goal.  She was advised to avoid salt and exercise.  2. Coronary artery disease involving native coronary artery of native heart without angina pectoris 3. Mixed hyperlipidemia -Nonobstructive CAD.  On aspirin.  On Lipitor.  LDL at goal.  She is diabetic.  She will continue to work on this with her primary care physician.  4. Tobacco abuse -Still smoking 1 pack/day.  3 minutes of smoking cessation counseling was provided in office today.  5. Preop CV Exam -She will need right knee surgery.  She is euvolemic on exam.  Nonobstructive CAD on cath.  She may proceed to surgery at acceptable risk.  No further testing is needed.  Regarding her aspirin she can likely continue this through surgery.  If surgery would like to hold it that is fine with me.  None of her other medications need to be held.  Disposition: Return in about 6 months (around  07/22/2023).  Medication Adjustments/Labs and Tests Ordered: Current medicines are reviewed at length with the patient today.  Concerns regarding medicines are outlined above.  No orders of the defined types were placed in this encounter.  Meds ordered this encounter  Medications   DISCONTD: torsemide (DEMADEX) 20 MG tablet    Sig: Take 2 tablets (40 mg total) by mouth daily.    Dispense:  90 tablet    Refill:  3   torsemide (DEMADEX) 20 MG tablet    Sig: Take 2 tablets (40 mg total) by mouth daily.    Dispense:  180 tablet    Refill:  3   Patient Instructions  Medication Instructions:  Stop Bumex  Stop Lasix   New medication Torsemide 40mg  Daily  Continue all other prescribed medications  *If you need a refill on your cardiac medications before your next appointment, please call your pharmacy*   Lab Work: None needed.   Follow-Up: At [facility], you and your health needs are our priority.  As part of our continuing mission to provide you with exceptional heart care, we have created designated Provider Care Teams.  These Care Teams include your primary Cardiologist (physician) and Advanced Practice Providers (APPs -  Physician Assistants and Nurse Practitioners) who all work together to provide you with the care you need, when you need it.  We recommend signing up for the patient portal called "MyChart".  Sign up information is provided on this After Visit Summary.  MyChart is used to connect with patients for Virtual Visits (Telemedicine).  Patients are able to view lab/test results, encounter notes, upcoming appointments, etc.  Non-urgent messages can be sent to your provider as well.   To learn more about what you can do with MyChart, go to [email or web address].    Your next appointment:   6 month(s)  Provider:   Reatha Harps, MD         Time Spent with Patient: I have spent a total of 35 minutes with patient reviewing hospital notes, telemetry,  EKGs, labs and examining the patient as well as establishing an assessment and plan that was discussed with the patient.  > 50% of time was spent in direct patient care.  Signed, Lenna Gilford. Flora Lipps, MD, [facility]  [facility]  [address] [place], [place] [address] [phone]  01/19/2023 8:33 AM

## 2023-01-14 LAB — BASIC METABOLIC PANEL
BUN/Creatinine Ratio: 8 — ABNORMAL LOW (ref 12–28)
BUN: 7 mg/dL — ABNORMAL LOW (ref 8–27)
CO2: 27 mmol/L (ref 20–29)
Calcium: 9.1 mg/dL (ref 8.7–10.3)
Chloride: 97 mmol/L (ref 96–106)
Creatinine, Ser: 0.91 mg/dL (ref 0.57–1.00)
Glucose: 181 mg/dL — ABNORMAL HIGH (ref 70–99)
Potassium: 3.9 mmol/L (ref 3.5–5.2)
Sodium: 141 mmol/L (ref 134–144)
eGFR: 72 mL/min/{1.73_m2} (ref >59)

## 2023-01-19 ENCOUNTER — Ambulatory Visit: Payer: 59 | Attending: Cardiovascular Disease | Admitting: Cardiovascular Disease

## 2023-01-19 ENCOUNTER — Telehealth: Payer: Self-pay | Admitting: Orthopedic Surgery

## 2023-01-19 ENCOUNTER — Encounter: Payer: Self-pay | Admitting: Cardiovascular Disease

## 2023-01-19 VITALS — BP 118/72 | HR 76 | Ht 64.0 in | Wt 236.8 lb

## 2023-01-19 DIAGNOSIS — F17210 Nicotine dependence, cigarettes, uncomplicated: Secondary | ICD-10-CM | POA: Diagnosis not present

## 2023-01-19 DIAGNOSIS — I5032 Chronic diastolic (congestive) heart failure: Secondary | ICD-10-CM | POA: Diagnosis not present

## 2023-01-19 DIAGNOSIS — E782 Mixed hyperlipidemia: Secondary | ICD-10-CM

## 2023-01-19 DIAGNOSIS — Z01810 Encounter for preprocedural cardiovascular examination: Secondary | ICD-10-CM

## 2023-01-19 DIAGNOSIS — I2510 Atherosclerotic heart disease of native coronary artery without angina pectoris: Secondary | ICD-10-CM | POA: Diagnosis not present

## 2023-01-19 DIAGNOSIS — Z720 Tobacco use: Secondary | ICD-10-CM

## 2023-01-19 MED ORDER — TORSEMIDE 20 MG PO TABS: 40.0000 mg | ORAL_TABLET | Freq: Every day | ORAL | 3 refills | Status: DC

## 2023-01-19 NOTE — Patient Instructions (Addendum)
Medication Instructions:  Stop Bumex  Stop Lasix   New medication Torsemide 40mg  Daily  Continue all other prescribed medications  *If you need a refill on your cardiac medications before your next appointment, please call your pharmacy*   Lab Work: None needed.   Follow-Up: At [facility], you and your health needs are our priority.  As part of our continuing mission to provide you with exceptional heart care, we have created designated Provider Care Teams.  These Care Teams include your primary Cardiologist (physician) and Advanced Practice Providers (APPs -  Physician Assistants and Nurse Practitioners) who all work together to provide you with the care you need, when you need it.  We recommend signing up for the patient portal called "MyChart".  Sign up information is provided on this After Visit Summary.  MyChart is used to connect with patients for Virtual Visits (Telemedicine).  Patients are able to view lab/test results, encounter notes, upcoming appointments, etc.  Non-urgent messages can be sent to your provider as well.   To learn more about what you can do with MyChart, go to [email or web address].    Your next appointment:   6 month(s)  Provider:   Reatha Harps, MD

## 2023-01-19 NOTE — Telephone Encounter (Signed)
Dr. Mort Sawyers pt - pt lvm stating she needs to know when she can have her surgery, she stated she is tired of waiting.  She would like a call back [phone].

## 2023-01-23 ENCOUNTER — Telehealth: Payer: Self-pay | Admitting: Pediatrics

## 2023-01-23 ENCOUNTER — Other Ambulatory Visit: Payer: Self-pay | Admitting: Orthopedic Surgery

## 2023-01-23 ENCOUNTER — Telehealth: Payer: Self-pay | Admitting: Radiology

## 2023-01-23 DIAGNOSIS — M1711 Unilateral primary osteoarthritis, right knee: Secondary | ICD-10-CM

## 2023-01-23 DIAGNOSIS — Z01818 Encounter for other preprocedural examination: Secondary | ICD-10-CM

## 2023-01-23 MED ORDER — BUPIVACAINE-MELOXICAM ER 400-12 MG/14ML IJ SOLN
400.0000 mg | Freq: Once | INTRAMUSCULAR | Status: DC
Start: 2023-01-23 — End: 2023-03-09

## 2023-01-23 NOTE — Telephone Encounter (Signed)
I called her. She is awaiting a call from preop surgery will be Aug 13th, gave her the number to pre op and she will discuss arrival times when she has her pre op

## 2023-01-23 NOTE — Telephone Encounter (Signed)
-----   Message from [facility] sent at 01/23/2023  7:42 AM EDT ----- Can you schedule her for tka August 13

## 2023-01-23 NOTE — Telephone Encounter (Signed)
Mirabegron approval received per [facility] Auth number [id number] 12/13/2022-07/11/2023

## 2023-01-23 NOTE — Telephone Encounter (Signed)
Dr. Mort Sawyers pt - pt lvm stating she wants to speak to Dr. Mort Sawyers nurse. [phone]

## 2023-01-23 NOTE — Telephone Encounter (Signed)
Yes

## 2023-01-31 ENCOUNTER — Telehealth: Payer: Self-pay | Admitting: Orthopedic Surgery

## 2023-01-31 NOTE — Telephone Encounter (Signed)
Dr. Mort Sawyers pt - pt lvm stating she needs to speak to Dr. Mort Sawyers nurse about therapy.  [phone]

## 2023-01-31 NOTE — Telephone Encounter (Signed)
Called her. She states she set up outpatient therapy for Aug 28th was asking about home therapy, I told her order sent she will get a call just before or just after surgery.

## 2023-02-06 NOTE — Progress Notes (Unsigned)
Name: JADEN BURBRIDGE DOB: 07/21/1960 MRN: [id number]  History of Present Illness: Ms. Pupillo is a 62 y.o. female who presents today for follow up visit at [facility] [place]. - GU history: - GU/GYN History: 1. Stress urinary incontinence (resolved s/p MUS). 2. OAB with urinary frequency, urgency, and uge incontinence. - Previously failed trials of Oxybutynin and Vesicare. - Risk factors: Neurogenic (T2DM and spinal stenosis / prior back surgeries), diuretic use, caffeine intake.  3. Lichen sclerosus. 4. Vaginal atrophy. 5. 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 As per Dr. Laverle Hobby note on 09/30/2021. 5. Relevant surgical history includes: - Total abdominal hysterectomy with bilateral salpingo-oophorectomy - Cystocele repair 6. Not sexually active.  At last visit on 12/13/2022: - PVR = 0 ml.  - The plan was:  1. Minimize caffeine intake. 2. Work on timed voiding. 3. Start Myrbetriq 25 mg daily. 4. Maintain good blood sugar control. 5. Return in about 8 weeks.  Today: She denies any symptomatic improvement with Myrbetriq 25 mg daily - continues to experience bothersome urinary frequency, urgency, and urge incontinence. Continues to void 3-5x/day and 2-3x/night with urine leakage approximately 4x/day; wearing pads. She reports that she has reduced her caffeine intake.   She reports mild dysuria today. Denies gross hematuria, straining to void, or sensations of incomplete emptying. Denies fevers, abdominal pain, or flank pain   Fall Screening: Do you usually have a device to assist in your mobility? No   Medications: Current Outpatient Medications  Medication Sig Dispense Refill   albuterol (VENTOLIN HFA) 108 (90 Base) MCG/ACT inhaler Inhale 1-2 puffs into the lungs every 6 (six) hours as needed for wheezing or shortness of breath.     ARIPiprazole (ABILIFY) 2 MG tablet Take 2 mg by mouth daily.     aspirin EC 81 MG tablet Take 81 mg by mouth daily. Swallow  whole.     atorvastatin (LIPITOR) 80 MG tablet Take 1 tablet (80 mg total) by mouth daily. 90 tablet 3   estradiol (ESTRACE) 0.1 MG/GM vaginal cream Discard plastic applicator. Insert a blueberry size amount (approximately 1 gram) of cream on fingertip inside vagina at bedtime every night for 1 week then 2 nights per week for long term use. 30 g 3   estradiol (ESTRACE) 2 MG tablet TAKE ONE TABLET AT BEDTIME 30 tablet 11   FLUoxetine (PROZAC) 40 MG capsule Take 40 mg by mouth daily.     gabapentin (NEURONTIN) 300 MG capsule Take 1 capsule (300 mg total) by mouth 3 (three) times daily. 21 capsule 3   loratadine (CLARITIN) 10 MG tablet Take 10 mg by mouth daily as needed for allergies.     metFORMIN (GLUCOPHAGE) 500 MG tablet Take 500 mg by mouth daily with breakfast.     metoprolol tartrate (LOPRESSOR) 25 MG tablet Take 1 tablet (25 mg total) by mouth 2 (two) times daily. 180 tablet 3   omeprazole (PRILOSEC) 40 MG capsule Take 1 capsule (40 mg total) by mouth daily. 90 capsule 3   oxyCODONE-acetaminophen (PERCOCET) 7.5-325 MG tablet Take 1 tablet by mouth 4 (four) times daily.     oxymetazoline (AFRIN) 0.05 % nasal spray Place 1 spray into both nostrils 2 (two) times daily as needed for congestion.     potassium chloride SA (KLOR-CON M20) 20 MEQ tablet Take 1 tablet (20 mEq total) by mouth daily. 90 tablet 3   spironolactone (ALDACTONE) 25 MG tablet Take 1 tablet (25 mg total) by mouth daily. 90 tablet 3   torsemide (  DEMADEX) 20 MG tablet Take 2 tablets (40 mg total) by mouth daily. 180 tablet 3   traZODone (DESYREL) 100 MG tablet Take 200 mg by mouth at bedtime.     mirabegron ER (MYRBETRIQ) 25 MG TB24 tablet Take 2 tablets (50 mg total) by mouth daily. 60 tablet 11   nitroGLYCERIN (NITROSTAT) 0.4 MG SL tablet Place 1 tablet (0.4 mg total) under the tongue every 5 (five) minutes as needed for chest pain. 25 tablet 3   Current Facility-Administered Medications  Medication Dose Route Frequency  Provider Last Rate Last Admin   bupivacaine-meloxicam ER (ZYNRELEF) injection 400 mg  400 mg Infiltration Once Vickki Hearing, MD       bupivacaine-meloxicam ER (ZYNRELEF) injection 400 mg  400 mg Infiltration Once Vickki Hearing, MD        Allergies: No Known Allergies  Past Medical History:  Diagnosis Date   Anxiety    Arthritis    Asthma    Cancer (HCC)    cervical cancer; pt states a long long time ago   CHF (congestive heart failure) (HCC)    Coronary artery disease    Depression    Diabetes mellitus without complication (HCC)    Dyspnea    Edema of all four extremities    Essential (primary) hypertension 06/24/2020   Hyperlipemia    Type II diabetes mellitus (HCC) 03/22/2022   Past Surgical History:  Procedure Laterality Date   ABDOMINAL HYSTERECTOMY     ANTERIOR LAT LUMBAR FUSION N/A 11/05/2019   Procedure: Lumbar Four-Five Anterolateral lumbar interbody fusion with lateral plate;  Surgeon: Barnett Abu, MD;  Location: [facility] OR;  Service: Neurosurgery;  Laterality: N/A;  anterolateral   BACK SURGERY     pins   BACK SURGERY  07/11/2008   BIOPSY  06/29/2021   Procedure: BIOPSY;  Surgeon: Dolores Frame, MD;  Location: [facility];  Service: Gastroenterology;;   BLADDER REPAIR     CARPAL TUNNEL RELEASE Right    CESAREAN SECTION  1982, 1984   COLONOSCOPY N/A 06/12/2013   rehman: normal   CORONARY PRESSURE/FFR STUDY N/A 10/20/2022   Procedure: CORONARY PRESSURE/FFR STUDY;  Surgeon: [name], MD;  Location: [facility];  Service: Cardiovascular;  Laterality: N/A;   ESOPHAGOGASTRODUODENOSCOPY (EGD) WITH PROPOFOL N/A 06/29/2021   Procedure: ESOPHAGOGASTRODUODENOSCOPY (EGD) WITH PROPOFOL;  Surgeon: Dolores Frame, MD;  Location: [facility];  Service: Gastroenterology;  Laterality: N/A;  8:40   KNEE ARTHROSCOPY WITH MEDIAL MENISECTOMY Right 05/09/2019   Procedure: KNEE ARTHROSCOPY WITH MEDIAL MENISECTOMY AND LATERAL  MENISECTOMY;  Surgeon: Vickki Hearing, MD;  Location: [facility];  Service: Orthopedics;  Laterality: Right;   KNEE ARTHROSCOPY WITH MEDIAL MENISECTOMY Left 06/27/2019   Procedure: KNEE ARTHROSCOPY WITH MEDIAL MENISCECTOMY;  Surgeon: Vickki Hearing, MD;  Location: [facility];  Service: Orthopedics;  Laterality: Left;   LEFT HEART CATH AND CORONARY ANGIOGRAPHY N/A 10/20/2022   Procedure: LEFT HEART CATH AND CORONARY ANGIOGRAPHY;  Surgeon: [name], MD;  Location: [facility];  Service: Cardiovascular;  Laterality: N/A;   TOTAL KNEE ARTHROPLASTY Left 03/15/2022   Procedure: TOTAL KNEE ARTHROPLASTY;  Surgeon: Vickki Hearing, MD;  Location: [facility];  Service: Orthopedics;  Laterality: Left;   Family History  Problem Relation Age of Onset   Cancer Paternal Grandfather    Cancer Paternal Grandmother    Cancer Maternal Grandmother    Heart disease Father    Lung cancer Mother  Other Brother        gunshot   Colon cancer Other    Social History   Socioeconomic History   Marital status: Single    Spouse name: Not on file   Number of children: 2   Years of education: Not on file   Highest education level: Not on file  Occupational History   Occupation: Disabled  Tobacco Use   Smoking status: Former    Current packs/day: 1.00    Average packs/day: 1 pack/day for 20.0 years (20.0 ttl pk-yrs)    Types: Cigarettes   Smokeless tobacco: Never   Tobacco comments:    trying to quit  Vaping Use   Vaping status: Some Days  Substance and Sexual Activity   Alcohol use: No    Alcohol/week: 0.0 standard drinks of alcohol   Drug use: No   Sexual activity: Not Currently    Birth control/protection: Surgical    Comment: hyst  Other Topics Concern   Not on file  Social History Narrative   ** Merged History Encounter **       Social Determinants of Health   Financial Resource Strain: Low Risk  (10/06/2022)   Overall Financial Resource Strain (CARDIA)    Difficulty of  Paying Living Expenses: Not very hard  Food Insecurity: No Food Insecurity (10/06/2022)   Hunger Vital Sign    Worried About Running Out of Food in the Last Year: Never true    Ran Out of Food in the Last Year: Never true  Transportation Needs: No Transportation Needs (10/06/2022)   PRAPARE - Administrator, Civil Service (Medical): No    Lack of Transportation (Non-Medical): No  Physical Activity: Inactive (10/06/2022)   Exercise Vital Sign    Days of Exercise per Week: 0 days    Minutes of Exercise per Session: 0 min  Stress: No Stress Concern Present (10/06/2022)   [employer] of Occupational Health - Occupational Stress Questionnaire    Feeling of Stress : Not at all  Social Connections: Socially Isolated (10/06/2022)   Social Connection and Isolation Panel [NHANES]    Frequency of Communication with Friends and Family: Once a week    Frequency of Social Gatherings with Friends and Family: Once a week    Attends Religious Services: Never    Database administrator or Organizations: No    Attends Banker Meetings: Never    Marital Status: Divorced  Catering manager Violence: Not At Risk (10/06/2022)   Humiliation, Afraid, Rape, and Kick questionnaire    Fear of Current or Ex-Partner: No    Emotionally Abused: No    Physically Abused: No    Sexually Abused: No    Review of Systems Constitutional: Patient denies any unintentional weight loss or change in strength lntegumentary: Patient denies any rashes or pruritus Cardiovascular: Patient denies chest pain or syncope Respiratory: Patient denies shortness of breath Gastrointestinal: Patient denies nausea, vomiting, constipation, or diarrhea Musculoskeletal: Patient denies muscle cramps or weakness Neurologic: Patient denies convulsions or seizures Psychiatric: Patient denies memory problems Allergic/Immunologic: Patient denies recent allergic reaction(s) Hematologic/Lymphatic: Patient denies bleeding  tendencies Endocrine: Patient denies heat/cold intolerance  GU: As per HPI.  OBJECTIVE Vitals:   02/07/23 0851  BP: 112/69  Pulse: 99  Temp: 98.5 F (36.9 C)   There is no height or weight on file to calculate BMI.  Physical Examination  Constitutional: No obvious distress; patient is non-toxic appearing  Cardiovascular: No visible lower extremity edema.  Respiratory: The patient does not have audible wheezing/stridor; respirations do not appear labored  Gastrointestinal: Abdomen non-distended Musculoskeletal: Normal ROM of UEs  Skin: No obvious rashes/open sores  Neurologic: CN 2-12 grossly intact Psychiatric: Answered questions appropriately with normal affect  Hematologic/Lymphatic/Immunologic: No obvious bruises or sites of spontaneous bleeding  UA: positive for >30 WBC/hpf and bacteria (moderate) PVR: 0 ml  ASSESSMENT Urge incontinence - Plan: Urinalysis, Routine w reflex microscopic, BLADDER SCAN AMB NON-IMAGING, mirabegron ER (MYRBETRIQ) 25 MG TB24 tablet, Urine Culture, Ambulatory Referral For Surgery Scheduling  Mixed stress and urge urinary incontinence - Plan: Urinalysis, Routine w reflex microscopic, BLADDER SCAN AMB NON-IMAGING, Urine Culture  Urinary urgency - Plan: Urinalysis, Routine w reflex microscopic, BLADDER SCAN AMB NON-IMAGING, mirabegron ER (MYRBETRIQ) 25 MG TB24 tablet, Urine Culture, Ambulatory Referral For Surgery Scheduling  Mixed incontinence - Plan: Urinalysis, Routine w reflex microscopic, BLADDER SCAN AMB NON-IMAGING, Urine Culture  Exposure of implanted urethral mesh, subsequent encounter - Plan: Urinalysis, Routine w reflex microscopic, BLADDER SCAN AMB NON-IMAGING, Urine Culture  Atrophic vaginitis - Plan: estradiol (ESTRACE) 0.1 MG/GM vaginal cream, Urine Culture  Urinary frequency - Plan: Ambulatory Referral For Surgery Scheduling  Nocturia - Plan: Ambulatory Referral For Surgery Scheduling  Abnormal urinalysis - Plan: Urine  culture  We discussed the symptoms of overactive bladder (OAB), which include urinary urgency, frequency, nocturia, with or without urge incontinence.   While we may not know the exact etiology of OAB, several risk factors can be identified.  - We discussed this patient's risk factors for OAB-type symptoms including T2DM, spinal stenosis / prior back surgeries, diuretic use, caffeine intake.   We discussed the following management options in detail including potential benefits, risks, and side effects: Behavioral therapy: Modify fluid intake Decreasing bladder irritants (such as caffeine, acidic foods, spicy foods, alcohol) Urge suppression strategies Bladder retraining / timed voiding Double voiding Medication(s): Anticholinergic medications Beta-3 adrenergic agonist medications For refractory cases: PTNS (posterior tibial nerve stimulation) Sacral neuromodulation trial (Medtronic lnterStim or Axonics implant) Bladder Botox injections  We agreed to increase Myrbetriq to 50 mg daily and proceed with bladder Botox procedure.  Abnormal urinalysis today; she is somewhat symptomatic although unclear if potential UTI versus vaginal / urethral irritation related to her untreated vaginal atrophy. Will check urine culture and treat as indicated based on results.   The etiology and consequences of urogenital epithelial atrophy was explained to patient. The thinning of the epithelium of the urethra can contribute to urinary urgency and frequency syndromes. In addition, the normal bacterial flora that colonizes the perineum may contribute to UTI risk because the thin urethral epithelium allows the bacteria to become adherent and the change in vaginal pH can disrupt the vaginal / urethral microbiome and allow for bacterial overgrowth. Patient elected to start topical vaginal estrogen cream.  All questions were answered.   PLAN Advised the following: 1. Urine culture. 2. Myrbetriq 50 mg  daily. 3. Topical vaginal estrogen cream 2 nights/week. 4. Case requested with Dr. Ronne Binning for bladder Botox.    Orders Placed This Encounter  Procedures   Urine Culture   Urine culture   Urinalysis, Routine w reflex microscopic   Ambulatory Referral For Surgery Scheduling    Referral Priority:   Routine    Referral Type:   Consultation    Number of Visits Requested:   1   BLADDER SCAN AMB NON-IMAGING    It has been explained that the patient is to follow regularly with their PCP in addition to all  other providers involved in their care and to follow instructions provided by these respective offices. Patient advised to contact urology clinic if any urologic-pertaining questions, concerns, new symptoms or problems arise in the interim period.  Patient Instructions  Overactive bladder (OAB) overview for patients:  Symptoms may include: urinary urgency ("gotta go" feeling) urinary frequency (voiding >8 times per day) night time urination (nocturia) urge incontinence of urine (UUI)  While we do not know the exact etiology of OAB, several treatment options exist including:  Behavioral therapy: Reducing fluid intake Decreasing bladder stimulants (such as caffeine) and irritants (such as acidic food, spicy foods, alcohol) Urge suppression strategies Bladder retraining via timed voiding Pelvic floor physical therapy  Medication(s) - can use one or both of the drug classes below. Anticholinergic / antimuscarinic medications:  Mechanism of action: Activate M3 receptors to reduce detrusor stimulation and increase bladder capacity   (parasympathetic nervous system). Effect: Relaxes the bladder to decrease overactivity, increase bladder storage capacity, and increase time between voids. Onset: Slow acting (may take 8-12 weeks to determine efficacy). Medications include: Vesicare (Solifenacin), Ditropan (Oxybutynin), Detrol (Tolterodine), Toviaz (Fesoterodine), Sanctura (Trospium),  Urispas (Flavoxate), Enablex (Darifenacin), Bentyl (Dicyclomine), Levsin (Hyoscyamine ). Potential side effects include but are not limited to: Dry eyes, dry mouth, constipation, cognitive impairment, dementia risk with long term use, and urinary retention/ incomplete bladder emptying. Insurance companies generally prefer for patients to try 1-2 anticholinergic / antimuscarinic medications first due to low cost. Some exceptions are made based on patient-specific comorbidities / risk factors. Beta-3 agonist medications: Mechanism of action: Stimulates selective B3 adrenergic receptors to cause smooth muscle bladder relaxation (sympathetic nervous system). Effect: Relaxes the bladder to decrease overactivity, increase bladder storage capacity, and increase time between voids. Onset: Slow acting (may take 8-12 weeks to determine efficacy). Medications include: Myrbetriq (Mirabegron) and Vibegron Leslye Peer). Potential side effects include but are not limited to: urinary retention / incomplete bladder emptying and elevated blood pressure (more likely to occur in individuals with pre-existing uncontrolled hypertension). These medications tend to be more expensive than the anticholinergic / antimuscarinic medications.   For patients with refractory OAB (if the above treatment options have been unsuccessful): Posterior tibial nerve stimulation (PTNS). Small acupuncture-type needle inserted near ankle with electric current to stimulate bladder via posterior tibial nerve pathway. Initially requires 12 weekly in-office treatments lasting 30 minutes each; followed by monthly in-office treatments lasting 30 minutes each for 1 year.  Bladder Botox injections. How it is done: Typically done via in-office cystoscopy; sometimes done in the OR depending on the situation. The bladder is numbed with lidocaine instilled via a catheter. Then the urologist injects Botox into the bladder muscle wall in about 20 locations.  Causes local paralysis of the bladder muscle at the injection sites to reduce bladder muscle overactivity / spasms. The effect lasts for approximately 6 months and cannot be reversed once performed. Risks may included but are not limited to: infection, incomplete bladder emptying/ urinary retention, short term need for self-catheterization or indwelling catheter, and need for repeat therapy. There is a 5-12% chance of needing to catheterize with Botox - that usually resolves in a few months as the Botox wears off. Typically Botox injections would need to be repeated every 3-12 months since this is not a permanent therapy.  Sacral neuromodulation trial (Medtronic lnterStim or Axonics implant). Sacral neuromodulation is FDA-approved for uncontrolled urinary urgency, urinary frequency, urinary urge incontinence, non-obstructive urinary retention, or fecal incontinence. It is not FDA-approved as a treatment for pain. The  goal of this therapy is at least a 50% improvement in symptoms. It is NOT realistic to expect a 100% cure. This is a a 2-step outpatient procedure. After a successful test period, a permanent wire and generator are placed in the OR. We discussed the risk of infection. We reviewed the fact that about 30% of patients fail the test phase and are not candidates for permanent generator placement. During the 1-2 week trial phase, symptoms are documented by the patient to determine response. If patient gets at least a 50% improvement in symptoms, they may then proceed with Step 2. Step 1: Trial lead placement. Per physician discretion, may done one of two ways: Percutaneous nerve evaluation (PNE) in the [facility] urology office. Performed by urologist under local anesthesia (numbing the area with lidocaine) using a spinal needle for placement of test wire, which usually stays in place for 5-7 days to determine therapy response. Test lead placement in OR under anesthesia. Usually stays in place 2 weeks  to determine therapy response. > Step 2: Permanent implantation of sacral neuromodulation device, which is performed in the OR.  Sacral neuromodulation implants: All are conditionally MRI safe. Manufacturer: Medtronic Website: [employer]gl therapy/right-for-you.html Options: lnterStim X: Non-rechargeable. The battery lasts 10 years on average. lnterStim Micro: Rechargeable. The battery lasts 15 years on average and must be charged routinely. Approximately 50% smaller implant than lnterStim X implant.  Manufacturer: Axonics Website: Findrealrelief.axonics.com Options: Non-rechargeable (Axonics F15): The battery lasts 15 years on average. Rechargeable (Axonics R20): The battery lasts 20 years on average and must be charged in office for about 1 hour every 6-10 months on average. Approximately 50% smaller implant than Axonics non-rechargeable implant.  Note: Generally the rechargeable devices are only advised for very small or thin patients who may not have sufficient adipose tissue to comfortably overlay the implanted device.  Suprapubic catheter (SP tube) placement. Only done in severely refractory OAB when all other options have failed or are not a viable treatment choice depending on patient factors. Involves placement of a catheter through the lower abdomen into the bladder to continuously drain the bladder into an external collection bag, which patient can then empty at their convenience every few hours. Done via an outpatient surgical procedure in the OR under anesthesia. Risks may included but are not limited to: surgical site pain, infections, skin irritation / breakdown, chronic bacteriuria, symptomatic UTls. The SP tube must stay in place continuously. This is a reversible procedure however - the insertion site will close if catheter is removed for more than a few hours. The SP tube must be exchanged  routinely every 4 weeks to prevent the catheter from becoming clogged with sediment. SP tube exchanges are typically performed at a urology nurse visit or by a home health nurse.   Electronically signed by:  Donnita Falls, FNP   02/07/23    10:02 AM

## 2023-02-07 ENCOUNTER — Ambulatory Visit (INDEPENDENT_AMBULATORY_CARE_PROVIDER_SITE_OTHER): Payer: 59 | Admitting: Urology

## 2023-02-07 ENCOUNTER — Encounter: Payer: Self-pay | Admitting: Urology

## 2023-02-07 VITALS — BP 112/69 | HR 99 | Temp 98.5°F

## 2023-02-07 DIAGNOSIS — N952 Postmenopausal atrophic vaginitis: Secondary | ICD-10-CM | POA: Diagnosis not present

## 2023-02-07 DIAGNOSIS — R8290 Unspecified abnormal findings in urine: Secondary | ICD-10-CM

## 2023-02-07 DIAGNOSIS — N3946 Mixed incontinence: Secondary | ICD-10-CM

## 2023-02-07 DIAGNOSIS — R351 Nocturia: Secondary | ICD-10-CM

## 2023-02-07 DIAGNOSIS — N3941 Urge incontinence: Secondary | ICD-10-CM

## 2023-02-07 DIAGNOSIS — R350 Frequency of micturition: Secondary | ICD-10-CM | POA: Diagnosis not present

## 2023-02-07 DIAGNOSIS — R3915 Urgency of urination: Secondary | ICD-10-CM

## 2023-02-07 DIAGNOSIS — T83722D Exposure of implanted urethral mesh into urethra, subsequent encounter: Secondary | ICD-10-CM

## 2023-02-07 LAB — MICROSCOPIC EXAMINATION: WBC, UA: >30 /hpf — AB (ref 0–5)

## 2023-02-07 LAB — URINALYSIS, ROUTINE W REFLEX MICROSCOPIC
Bilirubin, UA: NEGATIVE
Ketones, UA: NEGATIVE
Nitrite, UA: NEGATIVE
Protein,UA: NEGATIVE
RBC, UA: NEGATIVE
Specific Gravity, UA: <1.005 — ABNORMAL LOW (ref 1.005–1.030)
Urobilinogen, Ur: 1 mg/dL (ref 0.2–1.0)
pH, UA: 7 (ref 5.0–7.5)

## 2023-02-07 LAB — BLADDER SCAN AMB NON-IMAGING: Scan Result: 0

## 2023-02-07 MED ORDER — MIRABEGRON ER 25 MG PO TB24
50.0000 mg | ORAL_TABLET | Freq: Every day | ORAL | 11 refills | Status: DC
Start: 2023-02-07 — End: 2023-05-09

## 2023-02-07 MED ORDER — ESTRADIOL 0.1 MG/GM VA CREA: TOPICAL_CREAM | VAGINAL | 3 refills | Status: DC

## 2023-02-07 NOTE — Patient Instructions (Signed)
Overactive bladder (OAB) overview for patients:  Symptoms may include: urinary urgency ("gotta go" feeling) urinary frequency (voiding >8 times per day) night time urination (nocturia) urge incontinence of urine (UUI)  While we do not know the exact etiology of OAB, several treatment options exist including:  Behavioral therapy: Reducing fluid intake Decreasing bladder stimulants (such as caffeine) and irritants (such as acidic food, spicy foods, alcohol) Urge suppression strategies Bladder retraining via timed voiding Pelvic floor physical therapy  Medication(s) - can use one or both of the drug classes below. Anticholinergic / antimuscarinic medications:  Mechanism of action: Activate M3 receptors to reduce detrusor stimulation and increase bladder capacity  (parasympathetic nervous system). Effect: Relaxes the bladder to decrease overactivity, increase bladder storage capacity, and increase time between voids. Onset: Slow acting (may take 8-12 weeks to determine efficacy). Medications include: Vesicare (Solifenacin), Ditropan (Oxybutynin), Detrol (Tolterodine), Toviaz (Fesoterodine), Sanctura (Trospium), Urispas (Flavoxate), Enablex (Darifenacin), Bentyl (Dicyclomine), Levsin (Hyoscyamine ). Potential side effects include but are not limited to: Dry eyes, dry mouth, constipation, cognitive impairment, dementia risk with long term use, and urinary retention/ incomplete bladder emptying. Insurance companies generally prefer for patients to try 1-2 anticholinergic / antimuscarinic medications first due to low cost. Some exceptions are made based on patient-specific comorbidities / risk factors. Beta-3 agonist medications: Mechanism of action: Stimulates selective B3 adrenergic receptors to cause smooth muscle bladder relaxation (sympathetic nervous system). Effect: Relaxes the bladder to decrease overactivity, increase bladder storage capacity, and increase time between voids. Onset:  Slow acting (may take 8-12 weeks to determine efficacy). Medications include: Myrbetriq (Mirabegron) and Vibegron (Gemtesa). Potential side effects include but are not limited to: urinary retention / incomplete bladder emptying and elevated blood pressure (more likely to occur in individuals with pre-existing uncontrolled hypertension). These medications tend to be more expensive than the anticholinergic / antimuscarinic medications.   For patients with refractory OAB (if the above treatment options have been unsuccessful): Posterior tibial nerve stimulation (PTNS). Small acupuncture-type needle inserted near ankle with electric current to stimulate bladder via posterior tibial nerve pathway. Initially requires 12 weekly in-office treatments lasting 30 minutes each; followed by monthly in-office treatments lasting 30 minutes each for 1 year.  Bladder Botox injections. How it is done: Typically done via in-office cystoscopy; sometimes done in the OR depending on the situation. The bladder is numbed with lidocaine instilled via a catheter. Then the urologist injects Botox into the bladder muscle wall in about 20 locations. Causes local paralysis of the bladder muscle at the injection sites to reduce bladder muscle overactivity / spasms. The effect lasts for approximately 6 months and cannot be reversed once performed. Risks may included but are not limited to: infection, incomplete bladder emptying/ urinary retention, short term need for self-catheterization or indwelling catheter, and need for repeat therapy. There is a 5-12% chance of needing to catheterize with Botox - that usually resolves in a few months as the Botox wears off. Typically Botox injections would need to be repeated every 3-12 months since this is not a permanent therapy.  Sacral neuromodulation trial (Medtronic lnterStim or Axonics implant). Sacral neuromodulation is FDA-approved for uncontrolled urinary urgency, urinary frequency,  urinary urge incontinence, non-obstructive urinary retention, or fecal incontinence. It is not FDA-approved as a treatment for pain. The goal of this therapy is at least a 50% improvement in symptoms. It is NOT realistic to expect a 100% cure. This is a a 2-step outpatient procedure. After a successful test period, a permanent wire and generator are placed   in the OR. We discussed the risk of infection. We reviewed the fact that about 30% of patients fail the test phase and are not candidates for permanent generator placement. During the 1-2 week trial phase, symptoms are documented by the patient to determine response. If patient gets at least a 50% improvement in symptoms, they may then proceed with Step 2. Step 1: Trial lead placement. Per physician discretion, may done one of two ways: Percutaneous nerve evaluation (PNE) in the [facility] office. Performed by urologist under local anesthesia (numbing the area with lidocaine) using a spinal needle for placement of test wire, which usually stays in place for 5-7 days to determine therapy response. Test lead placement in OR under anesthesia. Usually stays in place 2 weeks to determine therapy response. > Step 2: Permanent implantation of sacral neuromodulation device, which is performed in the OR.  Sacral neuromodulation implants: All are conditionally MRI safe. Manufacturer: Medtronic Website: www.medtronic.com/[place]-en/patients/treatments-therapies/neurostimulator-overactive-bladder/getting therapy/right-for-you.html Options: lnterStim X: Non-rechargeable. The battery lasts 10 years on average. lnterStim Micro: Rechargeable. The battery lasts 15 years on average and must be charged routinely. Approximately 50% smaller implant than lnterStim X implant.  Manufacturer: Axonics Website: Findrealrelief.axonics.com Options: Non-rechargeable (Axonics F15): The battery lasts 15 years on average. Rechargeable (Axonics R20): The battery lasts 20 years on  average and must be charged in office for about 1 hour every 6-10 months on average. Approximately 50% smaller implant than Axonics non-rechargeable implant.  Note: Generally the rechargeable devices are only advised for very small or thin patients who may not have sufficient adipose tissue to comfortably overlay the implanted device.  Suprapubic catheter (SP tube) placement. Only done in severely refractory OAB when all other options have failed or are not a viable treatment choice depending on patient factors. Involves placement of a catheter through the lower abdomen into the bladder to continuously drain the bladder into an external collection bag, which patient can then empty at their convenience every few hours. Done via an outpatient surgical procedure in the OR under anesthesia. Risks may included but are not limited to: surgical site pain, infections, skin irritation / breakdown, chronic bacteriuria, symptomatic UTls. The SP tube must stay in place continuously. This is a reversible procedure however - the insertion site will close if catheter is removed for more than a few hours. The SP tube must be exchanged routinely every 4 weeks to prevent the catheter from becoming clogged with sediment. SP tube exchanges are typically performed at a urology nurse visit or by a home health nurse.  

## 2023-02-12 ENCOUNTER — Other Ambulatory Visit: Payer: Self-pay | Admitting: Urology

## 2023-02-12 DIAGNOSIS — N390 Urinary tract infection, site not specified: Secondary | ICD-10-CM

## 2023-02-12 MED ORDER — NITROFURANTOIN MONOHYD MACRO 100 MG PO CAPS
100.0000 mg | ORAL_CAPSULE | Freq: Two times a day (BID) | ORAL | 0 refills | Status: AC
Start: 2023-02-12 — End: 2023-02-19

## 2023-02-12 NOTE — Progress Notes (Signed)
Please let pt know urine culture result. I have sent prescription for Macrobid. Thanks. 

## 2023-02-13 ENCOUNTER — Telehealth: Payer: Self-pay

## 2023-02-13 NOTE — Telephone Encounter (Signed)
-----   Message from Donnita Falls sent at 02/12/2023 10:18 AM EDT ----- Please let pt know urine culture result. I have sent prescription for Macrobid. Thanks.

## 2023-02-13 NOTE — Patient Instructions (Signed)
[name]  02/13/2023     @PREFPERIOPPHARMACY @   Your procedure is scheduled on  02/21/2023.   Report to [facility] at  0600 A.M.   Call this number if you have problems the morning of surgery:  [phone]  If you experience any cold or flu symptoms such as cough, fever, chills, shortness of breath, etc. between now and your scheduled surgery, please notify us at the above number.   Remember:  Do not eat or drink after midnight.        DO NOT take any medications for diabetes the morning of your procedure.      Use your inhaler before you come and bring your rescue inhaler with you.      Take these medicines the morning of surgery with A SIP OF WATER         abilify, prozac, gabapentin, loratadine, metoprolol, omeprazole, oxycodone(if needed).    Do not wear jewelry, make-up or nail polish, including gel polish,  artificial nails, or any other type of covering on natural nails (fingers and  toes).  Do not wear lotions, powders, or perfumes, or deodorant.  Do not shave 48 hours prior to surgery.  Men may shave face and neck.  Do not bring valuables to the hospital.  [facility] is not responsible for any belongings or valuables.  Contacts, dentures or bridgework may not be worn into surgery.  Leave your suitcase in the car.  After surgery it may be brought to your room.  For patients admitted to the hospital, discharge time will be determined by your treatment team.  Patients discharged the day of surgery will not be allowed to drive home and must have someone with them for 24 hours.    Special instructions:   DO NOT smoke tobacco or vape for 24 hours before your procedure.  Please read over the following fact sheets that you were given. Pain Booklet, Coughing and Deep Breathing, Blood Transfusion Information, Lab Information, Total Joint Packet, Surgical Site Infection Prevention, Anesthesia Post-op Instructions, and Care and Recovery After  Surgery      Total Knee Replacement, Care After This sheet gives you information about how to care for yourself after your procedure. Your health care provider may also give you more specific instructions. If you have problems or questions, contact your health care provider. What can I expect after the procedure? After the procedure, it is common to have: Redness, pain, and swelling at the incision area. Stiffness. Discomfort. A small amount of blood or clear fluid coming from your incision. Follow these instructions at home: Medicines Take over-the-counter and prescription medicines only as told by your health care provider. If you were prescribed a blood thinner (anticoagulant), take it as told by your health care provider. Ask your health care provider if the medicine prescribed to you: Requires you to avoid driving or using machinery. Can cause constipation. You may need to take these actions to prevent or treat constipation: Drink enough fluid to keep your urine pale yellow. Take over-the-counter or prescription medicines. Eat foods that are high in fiber, such as beans, whole grains, and fresh fruits and vegetables. Limit foods that are high in fat and processed sugars, such as fried or sweet foods. Incision care  Follow instructions from your health care provider about how to take care of your incision. Make sure you: Wash your hands with soap and water for at least 20 seconds before and after  you change your bandage (dressing). If soap and water are not available, use hand sanitizer. Change your dressing as told by your health care provider. Leave stitches (sutures), staples, skin glue, or adhesive strips in place. These skin closures may need to stay in place for 2 weeks or longer. If adhesive strip edges start to loosen and curl up, you may trim the loose edges. Do not remove adhesive strips completely unless your health care provider tells you to do that. Do not take baths,  swim, or use a hot tub until your health care provider approves. Check your incision area every day for signs of infection. Check for: More redness, swelling, or pain. More fluid or blood. Warmth. Pus or a bad smell. Activity Rest as told by your health care provider. Avoid sitting for a long time without moving. Get up to take short walks every 1-2 hours. This is important to improve blood flow and breathing. Ask for help if you feel weak or unsteady. Follow instructions from your health care provider about using a walker, crutches, or a cane. You may use your legs to support (bear) your body weight as told by your health care provider. Follow instructions about how much weight you may safely support on your affected leg (weight-bearing restrictions). A physical therapist may show you how to get out of a bed and chair and how to go up and down stairs. You will first do this with a walker, crutches, or a cane and then without any of these devices. Once you are able to walk without a limp, you may stop using a walker, crutches, or a cane. Do exercises as told by your health care provider or physical therapist. Avoid high-impact activities, including running, jumping rope, and doing jumping jacks. Do not play contact sports until your health care provider approves. Return to your normal activities as told by your health care provider. Ask your health care provider what activities are safe for you. Managing pain, stiffness, and swelling  If directed, put ice on your knee. To do this: Put ice in a plastic bag or use the icing device (cold flow pad) that you were given. Follow instructions from your health care provider about how to use the icing device. Place a towel between your skin and the bag or between your skin and the icing device. Leave the ice on for 20 minutes, 2-3 times a day. Remove the ice if your skin turns bright red. This is very important. If you cannot feel pain, heat, or cold,  you have a greater risk of damage to the area. Move your toes often to reduce stiffness and swelling. Raise (elevate) your leg above the level of your heart while you are sitting or lying down. Use several pillows to keep your leg straight. Do not put a pillow just under the knee. If the knee is bent for a long time, this may lead to stiffness. Wear elastic knee support as told by your health care provider. Safety  To help prevent falls, keep floors clear of objects you may trip over. Place items that you may need within easy reach. Wear an apron or tool belt with pockets for carrying objects. This leaves your hands free to help with your balance. Ask your health care provider when it is safe to drive. General instructions Wear compression stockings as told by your health care provider. These stockings help to prevent blood clots and reduce swelling in your legs. Continue with breathing exercises. This helps  prevent lung infection. Do not use any products that contain nicotine or tobacco. These products include cigarettes, chewing tobacco, and vaping devices, such as e-cigarettes. These can delay healing after surgery. If you need help quitting, ask your health care provider. Tell your health care provider if you plan to have dental work. Also: Tell your dentist about your joint replacement. Ask your health care provider if there are any special instructions you need to follow before having dental care and routine cleanings. Keep all follow-up visits. This is important. Contact a health care provider if: You have a fever or chills. You have a cough or feel short of breath. Your medicine is not controlling your pain. You have any of these signs of infection: More redness, swelling, or pain around your incision. More fluid or blood coming from your incision. Warmth coming from your incision. Pus or a bad smell coming from your incision. You fall. Get help right away if: You have severe  pain. You have trouble breathing. You have chest pain. You have redness, swelling, pain, or warmth in your calf or leg. Your incision breaks open after sutures or staples are removed. These symptoms may represent a serious problem that is an emergency. Do not wait to see if the symptoms will go away. Get medical help right away. Call your local emergency services (911 in the [place] Do not drive yourself to the hospital. Summary After the procedure, it is common to have pain and swelling at the incision area, a small amount of blood or fluid coming from your incision, and stiffness. Follow instructions from your health care provider about how to take care of your incision. Use crutches, a walker, or a cane as told by your health care provider. This information is not intended to replace advice given to you by your health care provider. Make sure you discuss any questions you have with your health care provider. Document Revised: 12/16/2019 Document Reviewed: 12/17/2019 Elsevier Patient Education  2024 [employer] General Anesthesia, Adult, Care After The following information offers guidance on how to care for yourself after your procedure. Your health care provider may also give you more specific instructions. If you have problems or questions, contact your health care provider. What can I expect after the procedure? After the procedure, it is common for people to: Have pain or discomfort at the IV site. Have nausea or vomiting. Have a sore throat or hoarseness. Have trouble concentrating. Feel cold or chills. Feel weak, sleepy, or tired (fatigue). Have soreness and body aches. These can affect parts of the body that were not involved in surgery. Follow these instructions at home: For the time period you were told by your health care provider:  Rest. Do not participate in activities where you could fall or become injured. Do not drive or use machinery. Do not drink alcohol. Do not  take sleeping pills or medicines that cause drowsiness. Do not make important decisions or sign legal documents. Do not take care of children on your own. General instructions Drink enough fluid to keep your urine pale yellow. If you have sleep apnea, surgery and certain medicines can increase your risk for breathing problems. Follow instructions from your health care provider about wearing your sleep device: Anytime you are sleeping, including during daytime naps. While taking prescription pain medicines, sleeping medicines, or medicines that make you drowsy. Return to your normal activities as told by your health care provider. Ask your health care provider what activities are safe for you.  Take over-the-counter and prescription medicines only as told by your health care provider. Do not use any products that contain nicotine or tobacco. These products include cigarettes, chewing tobacco, and vaping devices, such as e-cigarettes. These can delay incision healing after surgery. If you need help quitting, ask your health care provider. Contact a health care provider if: You have nausea or vomiting that does not get better with medicine. You vomit every time you eat or drink. You have pain that does not get better with medicine. You cannot urinate or have bloody urine. You develop a skin rash. You have a fever. Get help right away if: You have trouble breathing. You have chest pain. You vomit blood. These symptoms may be an emergency. Get help right away. Call 911. Do not wait to see if the symptoms will go away. Do not drive yourself to the hospital. Summary After the procedure, it is common to have a sore throat, hoarseness, nausea, vomiting, or to feel weak, sleepy, or fatigue. For the time period you were told by your health care provider, do not drive or use machinery. Get help right away if you have difficulty breathing, have chest pain, or vomit blood. These symptoms may be an  emergency. This information is not intended to replace advice given to you by your health care provider. Make sure you discuss any questions you have with your health care provider. Document Revised: 09/24/2021 Document Reviewed: 09/24/2021 Elsevier Patient Education  2024 [employer]. How to Use Chlorhexidine Before Surgery Chlorhexidine gluconate (CHG) is a germ-killing (antiseptic) solution that is used to clean the skin. It can get rid of the bacteria that normally live on the skin and can keep them away for about 24 hours. To clean your skin with CHG, you may be given: A CHG solution to use in the shower or as part of a sponge bath. A prepackaged cloth that contains CHG. Cleaning your skin with CHG may help lower the risk for infection: While you are staying in the intensive care unit of the hospital. If you have a vascular access, such as a central line, to provide short-term or long-term access to your veins. If you have a catheter to drain urine from your bladder. If you are on a ventilator. A ventilator is a machine that helps you breathe by moving air in and out of your lungs. After surgery. What are the risks? Risks of using CHG include: A skin reaction. Hearing loss, if CHG gets in your ears and you have a perforated eardrum. Eye injury, if CHG gets in your eyes and is not rinsed out. The CHG product catching fire. Make sure that you avoid smoking and flames after applying CHG to your skin. Do not use CHG: If you have a chlorhexidine allergy or have previously reacted to chlorhexidine. On babies younger than 52 months of age. How to use CHG solution Use CHG only as told by your health care provider, and follow the instructions on the label. Use the full amount of CHG as directed. Usually, this is one bottle. During a shower Follow these steps when using CHG solution during a shower (unless your health care provider gives you different instructions): Start the shower. Use  your normal soap and shampoo to wash your face and hair. Turn off the shower or move out of the shower stream. Pour the CHG onto a clean washcloth. Do not use any type of brush or rough-edged sponge. Starting at your neck, lather your body down  to your toes. Make sure you follow these instructions: If you will be having surgery, pay special attention to the part of your body where you will be having surgery. Scrub this area for at least 1 minute. Do not use CHG on your head or face. If the solution gets into your ears or eyes, rinse them well with water. Avoid your genital area. Avoid any areas of skin that have broken skin, cuts, or scrapes. Scrub your back and under your arms. Make sure to wash skin folds. Let the lather sit on your skin for 1-2 minutes or as long as told by your health care provider. Thoroughly rinse your entire body in the shower. Make sure that all body creases and crevices are rinsed well. Dry off with a clean towel. Do not put any substances on your body afterward--such as powder, lotion, or perfume--unless you are told to do so by your health care provider. Only use lotions that are recommended by the manufacturer. Put on clean clothes or pajamas. If it is the night before your surgery, sleep in clean sheets.  During a sponge bath Follow these steps when using CHG solution during a sponge bath (unless your health care provider gives you different instructions): Use your normal soap and shampoo to wash your face and hair. Pour the CHG onto a clean washcloth. Starting at your neck, lather your body down to your toes. Make sure you follow these instructions: If you will be having surgery, pay special attention to the part of your body where you will be having surgery. Scrub this area for at least 1 minute. Do not use CHG on your head or face. If the solution gets into your ears or eyes, rinse them well with water. Avoid your genital area. Avoid any areas of skin that have  broken skin, cuts, or scrapes. Scrub your back and under your arms. Make sure to wash skin folds. Let the lather sit on your skin for 1-2 minutes or as long as told by your health care provider. Using a different clean, wet washcloth, thoroughly rinse your entire body. Make sure that all body creases and crevices are rinsed well. Dry off with a clean towel. Do not put any substances on your body afterward--such as powder, lotion, or perfume--unless you are told to do so by your health care provider. Only use lotions that are recommended by the manufacturer. Put on clean clothes or pajamas. If it is the night before your surgery, sleep in clean sheets. How to use CHG prepackaged cloths Only use CHG cloths as told by your health care provider, and follow the instructions on the label. Use the CHG cloth on clean, dry skin. Do not use the CHG cloth on your head or face unless your health care provider tells you to. When washing with the CHG cloth: Avoid your genital area. Avoid any areas of skin that have broken skin, cuts, or scrapes. Before surgery Follow these steps when using a CHG cloth to clean before surgery (unless your health care provider gives you different instructions): Using the CHG cloth, vigorously scrub the part of your body where you will be having surgery. Scrub using a back-and-forth motion for 3 minutes. The area on your body should be completely wet with CHG when you are done scrubbing. Do not rinse. Discard the cloth and let the area air-dry. Do not put any substances on the area afterward, such as powder, lotion, or perfume. Put on clean clothes or pajamas. If  it is the night before your surgery, sleep in clean sheets.  For general bathing Follow these steps when using CHG cloths for general bathing (unless your health care provider gives you different instructions). Use a separate CHG cloth for each area of your body. Make sure you wash between any folds of skin and  between your fingers and toes. Wash your body in the following order, switching to a new cloth after each step: The front of your neck, shoulders, and chest. Both of your arms, under your arms, and your hands. Your stomach and groin area, avoiding the genitals. Your right leg and foot. Your left leg and foot. The back of your neck, your back, and your buttocks. Do not rinse. Discard the cloth and let the area air-dry. Do not put any substances on your body afterward--such as powder, lotion, or perfume--unless you are told to do so by your health care provider. Only use lotions that are recommended by the manufacturer. Put on clean clothes or pajamas. Contact a health care provider if: Your skin gets irritated after scrubbing. You have questions about using your solution or cloth. You swallow any chlorhexidine. Call your local poison control center (415-041-6549 in the [place] Get help right away if: Your eyes itch badly, or they become very red or swollen. Your skin itches badly and is red or swollen. Your hearing changes. You have trouble seeing. You have swelling or tingling in your mouth or throat. You have trouble breathing. These symptoms may represent a serious problem that is an emergency. Do not wait to see if the symptoms will go away. Get medical help right away. Call your local emergency services (911 in the [place] Do not drive yourself to the hospital. Summary Chlorhexidine gluconate (CHG) is a germ-killing (antiseptic) solution that is used to clean the skin. Cleaning your skin with CHG may help to lower your risk for infection. You may be given CHG to use for bathing. It may be in a bottle or in a prepackaged cloth to use on your skin. Carefully follow your health care provider's instructions and the instructions on the product label. Do not use CHG if you have a chlorhexidine allergy. Contact your health care provider if your skin gets irritated after scrubbing. This  information is not intended to replace advice given to you by your health care provider. Make sure you discuss any questions you have with your health care provider. Document Revised: 10/25/2021 Document Reviewed: 09/07/2020 Elsevier Patient Education  2023 [employer].

## 2023-02-13 NOTE — Telephone Encounter (Signed)
Patient called and made aware.

## 2023-02-14 ENCOUNTER — Telehealth: Payer: Self-pay | Admitting: Radiology

## 2023-02-14 ENCOUNTER — Telehealth: Payer: Self-pay | Admitting: Orthopedic Surgery

## 2023-02-14 ENCOUNTER — Encounter (HOSPITAL_COMMUNITY)
Admission: RE | Admit: 2023-02-14 | Discharge: 2023-02-14 | Disposition: A | Discharge status: Home / Self Care | Payer: 59 | Source: Ambulatory Visit | Attending: Orthopedic Surgery | Admitting: Orthopedic Surgery

## 2023-02-14 VITALS — BP 112/69 | HR 99 | Temp 98.5°F | Resp 18 | Ht 64.0 in | Wt 236.8 lb

## 2023-02-14 DIAGNOSIS — Z01818 Encounter for other preprocedural examination: Secondary | ICD-10-CM

## 2023-02-14 DIAGNOSIS — M1711 Unilateral primary osteoarthritis, right knee: Secondary | ICD-10-CM | POA: Insufficient documentation

## 2023-02-14 DIAGNOSIS — Z01812 Encounter for preprocedural laboratory examination: Secondary | ICD-10-CM | POA: Diagnosis present

## 2023-02-14 DIAGNOSIS — E119 Type 2 diabetes mellitus without complications: Secondary | ICD-10-CM | POA: Insufficient documentation

## 2023-02-14 LAB — TYPE AND SCREEN
ABO/RH(D): O POS
Antibody Screen: NEGATIVE

## 2023-02-14 LAB — CBC WITH DIFFERENTIAL/PLATELET
Abs Immature Granulocytes: 0.05 10*3/uL (ref 0.00–0.07)
Basophils Absolute: 0.1 10*3/uL (ref 0.0–0.1)
Basophils Relative: 1 %
Eosinophils Absolute: 0.1 10*3/uL (ref 0.0–0.5)
Eosinophils Relative: 1 %
HCT: 37.1 % (ref 36.0–46.0)
Hemoglobin: 11.5 g/dL — ABNORMAL LOW (ref 12.0–15.0)
Immature Granulocytes: 0 %
Lymphocytes Relative: 15 %
Lymphs Abs: 1.9 10*3/uL (ref 0.7–4.0)
MCH: 23.6 pg — ABNORMAL LOW (ref 26.0–34.0)
MCHC: 31 g/dL (ref 30.0–36.0)
MCV: 76 fL — ABNORMAL LOW (ref 80.0–100.0)
Monocytes Absolute: 0.8 10*3/uL (ref 0.1–1.0)
Monocytes Relative: 6 %
Neutro Abs: 9.6 10*3/uL — ABNORMAL HIGH (ref 1.7–7.7)
Neutrophils Relative %: 77 %
Platelets: 306 10*3/uL (ref 150–400)
RBC: 4.88 MIL/uL (ref 3.87–5.11)
RDW: 19.3 % — ABNORMAL HIGH (ref 11.5–15.5)
WBC: 12.6 10*3/uL — ABNORMAL HIGH (ref 4.0–10.5)
nRBC: 0 % (ref 0.0–0.2)

## 2023-02-14 LAB — BASIC METABOLIC PANEL
Anion gap: 12 (ref 5–15)
BUN: 11 mg/dL (ref 6–20)
CO2: 28 mmol/L (ref 22–32)
Calcium: 9.1 mg/dL (ref 8.9–10.3)
Chloride: 97 mmol/L — ABNORMAL LOW (ref 98–111)
Creatinine, Ser: 1 mg/dL (ref 0.44–1.00)
GFR, Estimated: >60 mL/min (ref >60)
Glucose, Bld: 69 mg/dL — ABNORMAL LOW (ref 70–99)
Potassium: 4 mmol/L (ref 3.5–5.1)
Sodium: 137 mmol/L (ref 135–145)

## 2023-02-14 LAB — PREPARE RBC (CROSSMATCH)

## 2023-02-14 LAB — SURGICAL PCR SCREEN
MRSA, PCR: NEGATIVE
Staphylococcus aureus: NEGATIVE

## 2023-02-14 LAB — HEMOGLOBIN A1C
Hgb A1c MFr Bld: 7.6 % — ABNORMAL HIGH (ref 4.8–5.6)
Mean Plasma Glucose: 171.42 mg/dL

## 2023-02-14 NOTE — Telephone Encounter (Signed)
Dr. Mort Sawyers pt - pt lvm stating she would like to speak to Amy regarding PT after surgery, per the patient her surgery is scheduled for 02/21/23. [phone]

## 2023-02-14 NOTE — Telephone Encounter (Signed)
Acceptable cardiac risk for R knee arthroscopy or total knee replacement  According to the office note 12/16/22 from cardiology I advised Selena Batten

## 2023-02-14 NOTE — Telephone Encounter (Signed)
-----   Message from [facility] May sent at 02/14/2023  9:24 AM EDT ----- Regarding: FW: cardiac clearance Back to you, I did not get to this one yesterday.  Thanks. ----- Message ----- From: Caffie Damme, RT Sent: 02/13/2023  10:04 AM EDT To: Cherre Huger, RT Subject: FW: cardiac clearance                           ----- Message ----- From: Elsie Amis, RN Sent: 02/13/2023   9:34 AM EDT To: Caffie Damme, RT Subject: cardiac clearance                              Good morning ! I was looking at Dr O'Neal's cardiac note from 7/11 that states he is going to send a note to Dr Romeo Apple about her clearance. I didn't see anything in the media tab. Did you guys get that yet? If so, can we get a copy of that or have it scanned in for Dr Alva Garnet to see? Thank you.

## 2023-02-14 NOTE — Telephone Encounter (Signed)
(  [phone] is the phone number for [facility]. Patient states she will call. I told her they will call her but she wants to call them instead.

## 2023-02-20 NOTE — H&P (Signed)
TOTAL KNEE ADMISSION H&P  Patient is being admitted for right total knee arthroplasty.  Subjective:  Chief Complaint:right knee pain.  HPI: [name], 62 y.o. female, has a history of pain and functional disability in the right knee secondary to osteoarthritis and has failed nonoperative conservative treatment for greater than a year.  She had successful left total knee arthroplasty although she complains of intermittent nonspecific discomfort in the left knee which workup has been negative with normal x-rays and normal alignment and no signs of infection  She did develop a wound breakdown in the left knee after surgery  She is considered high risk  She has had multiple bouts of chronic peripheral edema congestive heart failure and has satisfied all medical criteria to meet surgical intervention at this time  She describes constant pain is 10 out of 10 despite aggressive use of opioids.  Weight loss.  And activity modification including the use of a cane  She is anxious to have surgery  Patient Active Problem List   Diagnosis Date Noted   Lesion of vulva 10/06/2022   Lichen sclerosus et atrophicus 10/06/2022   S/P hysterectomy with oophorectomy 10/06/2022   Hot flashes 10/06/2022   Current use of estrogen therapy 10/06/2022   Anxiety 03/22/2022   Asthma 03/22/2022   Bipolar 1 disorder (HCC) 03/22/2022   Chronic pain 03/22/2022   Edema 03/22/2022   Gastro-esophageal reflux 03/22/2022   Hyperlipidemia 03/22/2022   Insomnia 03/22/2022   Menopausal symptom 03/22/2022   Sinusitis 03/22/2022   Type II diabetes mellitus (HCC) 03/22/2022   Urinary incontinence 03/22/2022   Osteoarthritis of left knee 03/15/2022   History of operative procedure on lumbosacral spinal structure 02/15/2022   Urge incontinence 09/02/2021   Cervical radiculopathy 12/30/2020   Pseudoarthrosis of lumbar spine 08/25/2020   Essential (primary) hypertension 06/24/2020   Body mass index (BMI)  45.0-49.9, adult (HCC) 05/06/2020   Carpal tunnel syndrome 04/01/2020   Spondylolisthesis, lumbar region 11/22/2019   Localized swelling of both lower legs 11/12/2019   Lumbar stenosis 11/05/2019   S/P left knee arthroscopy 06/27/2019 07/01/2019   Derangement of posterior horn of medial meniscus of left knee    Primary osteoarthritis of left knee    S/P right knee arthroscopy 05/09/19 05/29/2019   Depression 07/24/2014   LOWER LEG, ARTHRITIS, DEGEN./OSTEO 02/18/2009   DERANGEMENT MENISCUS 02/18/2009   ANSERINE BURSITIS, RIGHT 02/18/2009   Past Medical History:  Diagnosis Date   Anxiety    Arthritis    Asthma    Cancer (HCC)    cervical cancer; pt states a long long time ago   CHF (congestive heart failure) (HCC)    Coronary artery disease    Depression    Diabetes mellitus without complication (HCC)    Dyspnea    Edema of all four extremities    Essential (primary) hypertension 06/24/2020   Hyperlipemia    Type II diabetes mellitus (HCC) 03/22/2022    Past Surgical History:  Procedure Laterality Date   ABDOMINAL HYSTERECTOMY     ANTERIOR LAT LUMBAR FUSION N/A 11/05/2019   Procedure: Lumbar Four-Five Anterolateral lumbar interbody fusion with lateral plate;  Surgeon: Barnett Abu, MD;  Location: [facility] OR;  Service: Neurosurgery;  Laterality: N/A;  anterolateral   BACK SURGERY     pins   BACK SURGERY  07/11/2008   BIOPSY  06/29/2021   Procedure: BIOPSY;  Surgeon: Dolores Frame, MD;  Location: [facility];  Service: Gastroenterology;;   BLADDER REPAIR  CARPAL TUNNEL RELEASE Right    CESAREAN SECTION  1982, 1984   COLONOSCOPY N/A 06/12/2013   rehman: normal   CORONARY PRESSURE/FFR STUDY N/A 10/20/2022   Procedure: CORONARY PRESSURE/FFR STUDY;  Surgeon: [name], MD;  Location: [facility];  Service: Cardiovascular;  Laterality: N/A;   ESOPHAGOGASTRODUODENOSCOPY (EGD) WITH PROPOFOL N/A 06/29/2021   Procedure: ESOPHAGOGASTRODUODENOSCOPY (EGD)  WITH PROPOFOL;  Surgeon: Dolores Frame, MD;  Location: [facility];  Service: Gastroenterology;  Laterality: N/A;  8:40   KNEE ARTHROSCOPY WITH MEDIAL MENISECTOMY Right 05/09/2019   Procedure: KNEE ARTHROSCOPY WITH MEDIAL MENISECTOMY AND LATERAL MENISECTOMY;  Surgeon: Vickki Hearing, MD;  Location: [facility];  Service: Orthopedics;  Laterality: Right;   KNEE ARTHROSCOPY WITH MEDIAL MENISECTOMY Left 06/27/2019   Procedure: KNEE ARTHROSCOPY WITH MEDIAL MENISCECTOMY;  Surgeon: Vickki Hearing, MD;  Location: [facility];  Service: Orthopedics;  Laterality: Left;   LEFT HEART CATH AND CORONARY ANGIOGRAPHY N/A 10/20/2022   Procedure: LEFT HEART CATH AND CORONARY ANGIOGRAPHY;  Surgeon: [name], MD;  Location: [facility];  Service: Cardiovascular;  Laterality: N/A;   TOTAL KNEE ARTHROPLASTY Left 03/15/2022   Procedure: TOTAL KNEE ARTHROPLASTY;  Surgeon: Vickki Hearing, MD;  Location: [facility];  Service: Orthopedics;  Laterality: Left;    Current Facility-Administered Medications  Medication Dose Route Frequency Provider Last Rate Last Admin   bupivacaine-meloxicam ER (ZYNRELEF) injection 400 mg  400 mg Infiltration Once Vickki Hearing, MD       bupivacaine-meloxicam ER (ZYNRELEF) injection 400 mg  400 mg Infiltration Once Vickki Hearing, MD       Current Outpatient Medications  Medication Sig Dispense Refill Last Dose   acetaminophen (TYLENOL) 325 MG tablet Take 650 mg by mouth every 6 (six) hours as needed for moderate pain.      albuterol (VENTOLIN HFA) 108 (90 Base) MCG/ACT inhaler Inhale 1-2 puffs into the lungs every 6 (six) hours as needed for wheezing or shortness of breath.      ARIPiprazole (ABILIFY) 2 MG tablet Take 2 mg by mouth daily.      aspirin EC 81 MG tablet Take 81 mg by mouth daily. Swallow whole.      atorvastatin (LIPITOR) 80 MG tablet Take 1 tablet (80 mg total) by mouth daily. (Patient taking differently: Take 80 mg by mouth at bedtime.)  90 tablet 3    estradiol (ESTRACE) 2 MG tablet TAKE ONE TABLET AT BEDTIME 30 tablet 11    FLUoxetine (PROZAC) 40 MG capsule Take 40 mg by mouth daily.      gabapentin (NEURONTIN) 300 MG capsule Take 1 capsule (300 mg total) by mouth 3 (three) times daily. (Patient taking differently: Take 300 mg by mouth See admin instructions. Take 300 mg in the morning and 600 mg at night) 21 capsule 3    loratadine (CLARITIN) 10 MG tablet Take 10 mg by mouth daily.      metFORMIN (GLUCOPHAGE) 500 MG tablet Take 500 mg by mouth daily with breakfast.      metoprolol tartrate (LOPRESSOR) 25 MG tablet Take 1 tablet (25 mg total) by mouth 2 (two) times daily. 180 tablet 3    mirabegron ER (MYRBETRIQ) 25 MG TB24 tablet Take 2 tablets (50 mg total) by mouth daily. 60 tablet 11    nitroGLYCERIN (NITROSTAT) 0.4 MG SL tablet Place 1 tablet (0.4 mg total) under the tongue every 5 (five) minutes as needed for chest pain. 25 tablet 3  omeprazole (PRILOSEC) 40 MG capsule Take 1 capsule (40 mg total) by mouth daily. 90 capsule 3    oxyCODONE-acetaminophen (PERCOCET) 7.5-325 MG tablet Take 1 tablet by mouth 4 (four) times daily.      oxymetazoline (AFRIN) 0.05 % nasal spray Place 1 spray into both nostrils 2 (two) times daily as needed for congestion.      potassium chloride SA (KLOR-CON M20) 20 MEQ tablet Take 1 tablet (20 mEq total) by mouth daily. 90 tablet 3    spironolactone (ALDACTONE) 25 MG tablet Take 1 tablet (25 mg total) by mouth daily. 90 tablet 3    torsemide (DEMADEX) 20 MG tablet Take 2 tablets (40 mg total) by mouth daily. 180 tablet 3    traZODone (DESYREL) 100 MG tablet Take 200 mg by mouth at bedtime.      Empagliflozin-metFORMIN HCl ER (SYNJARDY XR) 12.11-998 MG TB24 Take 1 tablet by mouth daily.      estradiol (ESTRACE) 0.1 MG/GM vaginal cream Discard plastic applicator. Insert a blueberry size amount (approximately 1 gram) of cream on fingertip inside vagina at bedtime every night for 1 week then 2 nights  per week for long term use. 30 g 3    No Known Allergies  Social History   Tobacco Use   Smoking status: Former    Current packs/day: 1.00    Average packs/day: 1 pack/day for 20.0 years (20.0 ttl pk-yrs)    Types: Cigarettes   Smokeless tobacco: Never   Tobacco comments:    trying to quit  Substance Use Topics   Alcohol use: No    Alcohol/week: 0.0 standard drinks of alcohol    Family History  Problem Relation Age of Onset   Cancer Paternal Grandfather    Cancer Paternal Grandmother    Cancer Maternal Grandmother    Heart disease Father    Lung cancer Mother    Other Brother        gunshot   Colon cancer Other      Review of Systems  No fever no current active skin lesions no chest pain occasional shortness of breath Chronic lower back pain Chronic pain syndrome Chronic opioid use disorder  Objective:  Physical Exam Constitutional:      General: She is not in acute distress.    Appearance: Normal appearance. She is obese. She is not ill-appearing, toxic-appearing or diaphoretic.  HENT:     Head: Normocephalic and atraumatic.     Nose: Nose normal. No congestion or rhinorrhea.     Mouth/Throat:     Pharynx: No oropharyngeal exudate or posterior oropharyngeal erythema.  Eyes:     General: No scleral icterus.       Right eye: No discharge.        Left eye: No discharge.     Extraocular Movements: Extraocular movements intact.     Pupils: Pupils are equal, round, and reactive to light.  Cardiovascular:     Rate and Rhythm: Normal rate.     Pulses: Normal pulses.  Pulmonary:     Breath sounds: Normal breath sounds. No stridor. No rhonchi.  Chest:     Chest wall: No tenderness.  Abdominal:     General: Abdomen is flat.  Musculoskeletal:     Cervical back: Neck supple.     Comments: Right knee exam  Skin envelope is normal  Tenderness over the medial compartment and patellofemoral compartment.  Effusion noted.  Range of motion less than 5 degree flexion  contracture and overall flexion  arc 5-110 degrees for a total range of motion of 105 degrees  Stability test normal  Muscle tone and strength normal including extensor mechanism    Skin:    General: Skin is warm.     Capillary Refill: Capillary refill takes less than 2 seconds.  Neurological:     General: No focal deficit present.     Mental Status: She is alert and oriented to person, place, and time.     Sensory: No sensory deficit.     Motor: Weakness present.     Coordination: Coordination normal.     Gait: Gait abnormal.     Deep Tendon Reflexes: Reflexes normal.  Psychiatric:        Mood and Affect: Mood normal.        Behavior: Behavior normal.        Thought Content: Thought content normal.     Vital signs in last 24 hours:    Labs:   Estimated body mass index is 40.64 kg/m as calculated from the following:   Height as of 02/14/23: 5\' 4"  (1.626 m).   Weight as of 02/14/23: 107.4 kg.   Imaging Review Plain radiographs demonstrate severe degenerative joint disease of the right knee(s). The overall alignment ismild varus. The bone quality appears to be good for age and reported activity level.      Assessment/Plan:  End stage arthritis, right knee   The patient history, physical examination, clinical judgment of the provider and imaging studies are consistent with end stage degenerative joint disease of the right knee(s) and total knee arthroplasty is deemed medically necessary. The treatment options including medical management, injection therapy arthroscopy and arthroplasty were discussed at length. The risks and benefits of total knee arthroplasty were presented and reviewed. The risks due to aseptic loosening, infection, stiffness, patella tracking problems, thromboembolic complications and other imponderables were discussed. The patient acknowledged the explanation, agreed to proceed with the plan and consent was signed. Patient is being admitted for inpatient  treatment for surgery, pain control, PT, OT, prophylactic antibiotics, VTE prophylaxis, progressive ambulation and ADL's and discharge planning. The patient is planning to be discharged home with home health services    Plan is for right total knee arthroplasty standard instrumentation

## 2023-02-21 ENCOUNTER — Encounter (HOSPITAL_COMMUNITY)
Admission: RE | Disposition: A | Discharge status: Home Health | Payer: Self-pay | Source: Home / Self Care | Attending: Orthopedic Surgery

## 2023-02-21 ENCOUNTER — Ambulatory Visit (HOSPITAL_BASED_OUTPATIENT_CLINIC_OR_DEPARTMENT_OTHER): Payer: 59 | Admitting: Anesthesiology

## 2023-02-21 ENCOUNTER — Ambulatory Visit (HOSPITAL_COMMUNITY): Payer: 59

## 2023-02-21 ENCOUNTER — Observation Stay (HOSPITAL_COMMUNITY)
Admission: RE | Admit: 2023-02-21 | Discharge: 2023-02-22 | Disposition: A | Discharge status: Home Health | Payer: 59 | Attending: Orthopedic Surgery | Admitting: Orthopedic Surgery

## 2023-02-21 ENCOUNTER — Other Ambulatory Visit: Payer: Self-pay

## 2023-02-21 ENCOUNTER — Ambulatory Visit (HOSPITAL_COMMUNITY): Payer: 59 | Admitting: Anesthesiology

## 2023-02-21 DIAGNOSIS — I2510 Atherosclerotic heart disease of native coronary artery without angina pectoris: Secondary | ICD-10-CM | POA: Insufficient documentation

## 2023-02-21 DIAGNOSIS — E119 Type 2 diabetes mellitus without complications: Secondary | ICD-10-CM | POA: Insufficient documentation

## 2023-02-21 DIAGNOSIS — Z87891 Personal history of nicotine dependence: Secondary | ICD-10-CM | POA: Insufficient documentation

## 2023-02-21 DIAGNOSIS — I110 Hypertensive heart disease with heart failure: Secondary | ICD-10-CM | POA: Insufficient documentation

## 2023-02-21 DIAGNOSIS — Z79899 Other long term (current) drug therapy: Secondary | ICD-10-CM | POA: Diagnosis not present

## 2023-02-21 DIAGNOSIS — Z8541 Personal history of malignant neoplasm of cervix uteri: Secondary | ICD-10-CM | POA: Diagnosis not present

## 2023-02-21 DIAGNOSIS — I509 Heart failure, unspecified: Secondary | ICD-10-CM

## 2023-02-21 DIAGNOSIS — J45909 Unspecified asthma, uncomplicated: Secondary | ICD-10-CM | POA: Diagnosis not present

## 2023-02-21 DIAGNOSIS — Z7982 Long term (current) use of aspirin: Secondary | ICD-10-CM | POA: Insufficient documentation

## 2023-02-21 DIAGNOSIS — M1711 Unilateral primary osteoarthritis, right knee: Principal | ICD-10-CM | POA: Diagnosis present

## 2023-02-21 DIAGNOSIS — Z96652 Presence of left artificial knee joint: Secondary | ICD-10-CM | POA: Insufficient documentation

## 2023-02-21 DIAGNOSIS — Z7984 Long term (current) use of oral hypoglycemic drugs: Secondary | ICD-10-CM | POA: Diagnosis not present

## 2023-02-21 HISTORY — PX: TOTAL KNEE ARTHROPLASTY: SHX125

## 2023-02-21 LAB — GLUCOSE, CAPILLARY
Glucose-Capillary: 113 mg/dL — ABNORMAL HIGH (ref 70–99)
Glucose-Capillary: 174 mg/dL — ABNORMAL HIGH (ref 70–99)
Glucose-Capillary: 221 mg/dL — ABNORMAL HIGH (ref 70–99)
Glucose-Capillary: 149 mg/dL — ABNORMAL HIGH (ref 70–99)

## 2023-02-21 SURGERY — ARTHROPLASTY, KNEE, TOTAL
Anesthesia: General | Site: Knee | Laterality: Right

## 2023-02-21 MED ORDER — MENTHOL 3 MG MT LOZG: 1.0000 | LOZENGE | OROMUCOSAL | Status: DC | PRN

## 2023-02-21 MED ORDER — CEFAZOLIN SODIUM-DEXTROSE 2-4 GM/100ML-% IV SOLN
INTRAVENOUS | Status: AC
  Filled 2023-02-21: qty 100

## 2023-02-21 MED ORDER — METOCLOPRAMIDE HCL 5 MG/ML IJ SOLN
INTRAMUSCULAR | Status: DC | PRN
  Administered 2023-02-21: 10 mg via INTRAVENOUS

## 2023-02-21 MED ORDER — KETAMINE HCL 50 MG/5ML IJ SOSY
PREFILLED_SYRINGE | INTRAMUSCULAR | Status: AC
  Filled 2023-02-21: qty 5

## 2023-02-21 MED ORDER — EMPAGLIFLOZIN-METFORMIN HCL ER 12.5-1000 MG PO TB24: 1.0000 | ORAL_TABLET | Freq: Every day | ORAL | Status: DC

## 2023-02-21 MED ORDER — PROPOFOL 10 MG/ML IV BOLUS
INTRAVENOUS | Status: DC | PRN
Start: 2023-02-21 — End: 2023-02-21
  Administered 2023-02-21: 50 mg via INTRAVENOUS
  Administered 2023-02-21: 150 mg via INTRAVENOUS

## 2023-02-21 MED ORDER — SUGAMMADEX SODIUM 200 MG/2ML IV SOLN
INTRAVENOUS | Status: DC | PRN
  Administered 2023-02-21: 200 mg via INTRAVENOUS

## 2023-02-21 MED ORDER — LIDOCAINE 2% (20 MG/ML) 5 ML SYRINGE
INTRAMUSCULAR | Status: DC | PRN
  Administered 2023-02-21: 100 mg via INTRAVENOUS

## 2023-02-21 MED ORDER — MIDAZOLAM HCL 2 MG/2ML IJ SOLN
2.0000 mg | Freq: Once | INTRAMUSCULAR | Status: AC
  Administered 2023-02-21: 2 mg via INTRAVENOUS

## 2023-02-21 MED ORDER — METOPROLOL TARTRATE 25 MG PO TABS
25.0000 mg | ORAL_TABLET | Freq: Two times a day (BID) | ORAL | Status: DC
  Administered 2023-02-22: 25 mg via ORAL
  Filled 2023-02-21 (×2): qty 1

## 2023-02-21 MED ORDER — METHOCARBAMOL 1000 MG/10ML IJ SOLN: 500.0000 mg | Freq: Four times a day (QID) | INTRAVENOUS | Status: DC | PRN

## 2023-02-21 MED ORDER — HYDRALAZINE HCL 20 MG/ML IJ SOLN
INTRAMUSCULAR | Status: AC
  Filled 2023-02-21: qty 1

## 2023-02-21 MED ORDER — METFORMIN HCL ER 500 MG PO TB24
1000.0000 mg | ORAL_TABLET | Freq: Every day | ORAL | Status: DC
  Administered 2023-02-21 – 2023-02-22 (×2): 1000 mg via ORAL
  Filled 2023-02-21 (×2): qty 2

## 2023-02-21 MED ORDER — INSULIN ASPART 100 UNIT/ML IJ SOLN
0.0000 [IU] | Freq: Three times a day (TID) | INTRAMUSCULAR | Status: DC
  Administered 2023-02-21: 3 [IU] via SUBCUTANEOUS

## 2023-02-21 MED ORDER — ASPIRIN 81 MG PO CHEW
81.0000 mg | CHEWABLE_TABLET | Freq: Two times a day (BID) | ORAL | Status: DC
  Administered 2023-02-21 – 2023-02-22 (×2): 81 mg via ORAL
  Filled 2023-02-21 (×2): qty 1

## 2023-02-21 MED ORDER — PHENOL 1.4 % MT LIQD: 1.0000 | OROMUCOSAL | Status: DC | PRN

## 2023-02-21 MED ORDER — POLYETHYLENE GLYCOL 3350 17 G PO PACK: 17.0000 g | PACK | Freq: Every day | ORAL | Status: DC | PRN

## 2023-02-21 MED ORDER — OXYCODONE HCL 5 MG PO TABS
10.0000 mg | ORAL_TABLET | Freq: Once | ORAL | Status: AC
  Administered 2023-02-21: 10 mg via ORAL

## 2023-02-21 MED ORDER — FENTANYL CITRATE (PF) 100 MCG/2ML IJ SOLN
INTRAMUSCULAR | Status: AC
  Filled 2023-02-21: qty 2

## 2023-02-21 MED ORDER — FENTANYL CITRATE PF 50 MCG/ML IJ SOSY
PREFILLED_SYRINGE | INTRAMUSCULAR | Status: AC
  Filled 2023-02-21: qty 1

## 2023-02-21 MED ORDER — ESTRADIOL 0.5 MG PO TABS
2.0000 mg | ORAL_TABLET | Freq: Every day | ORAL | Status: DC
  Filled 2023-02-21 (×2): qty 2

## 2023-02-21 MED ORDER — SUCCINYLCHOLINE CHLORIDE 200 MG/10ML IV SOSY
PREFILLED_SYRINGE | INTRAVENOUS | Status: AC
  Filled 2023-02-21: qty 10

## 2023-02-21 MED ORDER — ROPIVACAINE HCL 5 MG/ML IJ SOLN
INTRAMUSCULAR | Status: AC
  Filled 2023-02-21: qty 30

## 2023-02-21 MED ORDER — ONDANSETRON HCL 4 MG PO TABS
4.0000 mg | ORAL_TABLET | Freq: Four times a day (QID) | ORAL | Status: DC | PRN
  Administered 2023-02-21: 4 mg via ORAL
  Filled 2023-02-21: qty 1

## 2023-02-21 MED ORDER — BISACODYL 5 MG PO TBEC: 5.0000 mg | DELAYED_RELEASE_TABLET | Freq: Every day | ORAL | Status: DC | PRN

## 2023-02-21 MED ORDER — PHENYLEPHRINE 80 MCG/ML (10ML) SYRINGE FOR IV PUSH (FOR BLOOD PRESSURE SUPPORT)
PREFILLED_SYRINGE | INTRAVENOUS | Status: AC
  Filled 2023-02-21: qty 10

## 2023-02-21 MED ORDER — INSULIN ASPART 100 UNIT/ML IJ SOLN: 0.0000 [IU] | Freq: Every day | INTRAMUSCULAR | Status: DC

## 2023-02-21 MED ORDER — KETAMINE HCL 10 MG/ML IJ SOLN
INTRAMUSCULAR | Status: DC | PRN
  Administered 2023-02-21 (×2): 25 mg via INTRAVENOUS

## 2023-02-21 MED ORDER — SODIUM CHLORIDE 0.9 % IR SOLN
Status: DC | PRN
  Administered 2023-02-21: 3000 mL

## 2023-02-21 MED ORDER — GABAPENTIN 300 MG PO CAPS: 300.0000 mg | ORAL_CAPSULE | Freq: Three times a day (TID) | ORAL | Status: DC

## 2023-02-21 MED ORDER — SUCCINYLCHOLINE CHLORIDE 200 MG/10ML IV SOSY
PREFILLED_SYRINGE | INTRAVENOUS | Status: DC | PRN
  Administered 2023-02-21: 120 mg via INTRAVENOUS

## 2023-02-21 MED ORDER — ONDANSETRON HCL 4 MG/2ML IJ SOLN: 4.0000 mg | Freq: Once | INTRAMUSCULAR | Status: DC | PRN

## 2023-02-21 MED ORDER — MIDAZOLAM HCL 2 MG/2ML IJ SOLN
INTRAMUSCULAR | Status: AC
  Filled 2023-02-21: qty 2

## 2023-02-21 MED ORDER — ACETAMINOPHEN 500 MG PO TABS
1000.0000 mg | ORAL_TABLET | Freq: Four times a day (QID) | ORAL | Status: DC
  Administered 2023-02-21 – 2023-02-22 (×3): 1000 mg via ORAL
  Filled 2023-02-21 (×3): qty 2

## 2023-02-21 MED ORDER — 0.9 % SODIUM CHLORIDE (POUR BTL) OPTIME
TOPICAL | Status: DC | PRN
  Administered 2023-02-21: 1000 mL

## 2023-02-21 MED ORDER — CELECOXIB 100 MG PO CAPS
ORAL_CAPSULE | ORAL | Status: AC
  Filled 2023-02-21: qty 2

## 2023-02-21 MED ORDER — OXYMETAZOLINE HCL 0.05 % NA SOLN: 1.0000 | Freq: Two times a day (BID) | NASAL | Status: DC | PRN

## 2023-02-21 MED ORDER — MEPERIDINE HCL 50 MG/ML IJ SOLN: 6.2500 mg | INTRAMUSCULAR | Status: DC | PRN

## 2023-02-21 MED ORDER — EMPAGLIFLOZIN 10 MG PO TABS
10.0000 mg | ORAL_TABLET | Freq: Every day | ORAL | Status: DC
  Filled 2023-02-21 (×3): qty 1

## 2023-02-21 MED ORDER — SODIUM CHLORIDE 0.9 % IV SOLN: INTRAVENOUS | Status: DC

## 2023-02-21 MED ORDER — TRAMADOL HCL 50 MG PO TABS
50.0000 mg | ORAL_TABLET | Freq: Four times a day (QID) | ORAL | Status: DC
  Administered 2023-02-21 – 2023-02-22 (×4): 50 mg via ORAL
  Filled 2023-02-21 (×4): qty 1

## 2023-02-21 MED ORDER — PROPOFOL 500 MG/50ML IV EMUL
INTRAVENOUS | Status: DC | PRN
Start: 2023-02-21 — End: 2023-02-21
  Administered 2023-02-21: 80 ug/kg/min via INTRAVENOUS
  Administered 2023-02-21: 60 ug/kg/min via INTRAVENOUS

## 2023-02-21 MED ORDER — HYDROMORPHONE HCL 1 MG/ML IJ SOLN
0.5000 mg | INTRAMUSCULAR | Status: DC | PRN
  Administered 2023-02-21 – 2023-02-22 (×4): 1 mg via INTRAVENOUS
  Filled 2023-02-21 (×4): qty 1

## 2023-02-21 MED ORDER — HYDROMORPHONE HCL 1 MG/ML IJ SOLN
INTRAMUSCULAR | Status: AC
  Filled 2023-02-21: qty 1

## 2023-02-21 MED ORDER — DEXAMETHASONE SODIUM PHOSPHATE 4 MG/ML IJ SOLN
INTRAMUSCULAR | Status: AC
  Filled 2023-02-21: qty 2

## 2023-02-21 MED ORDER — ONDANSETRON HCL 4 MG/2ML IJ SOLN
INTRAMUSCULAR | Status: DC | PRN
  Administered 2023-02-21: 4 mg via INTRAVENOUS

## 2023-02-21 MED ORDER — METFORMIN HCL 500 MG PO TABS: 500.0000 mg | ORAL_TABLET | Freq: Every day | ORAL | Status: DC

## 2023-02-21 MED ORDER — DEXAMETHASONE SODIUM PHOSPHATE 4 MG/ML IJ SOLN
INTRAMUSCULAR | Status: DC | PRN
  Administered 2023-02-21: 5 mg via INTRAVENOUS

## 2023-02-21 MED ORDER — OXYCODONE HCL 5 MG PO TABS
10.0000 mg | ORAL_TABLET | ORAL | Status: DC | PRN
  Administered 2023-02-21: 10 mg via ORAL
  Administered 2023-02-21 – 2023-02-22 (×3): 15 mg via ORAL
  Filled 2023-02-21 (×2): qty 3
  Filled 2023-02-21: qty 2
  Filled 2023-02-21: qty 3

## 2023-02-21 MED ORDER — METOCLOPRAMIDE HCL 10 MG PO TABS: 5.0000 mg | ORAL_TABLET | Freq: Three times a day (TID) | ORAL | Status: DC | PRN

## 2023-02-21 MED ORDER — DEXAMETHASONE SODIUM PHOSPHATE 10 MG/ML IJ SOLN
INTRAMUSCULAR | Status: AC
  Filled 2023-02-21: qty 1

## 2023-02-21 MED ORDER — LIDOCAINE HCL (PF) 1 % IJ SOLN
INTRAMUSCULAR | Status: AC
  Filled 2023-02-21: qty 30

## 2023-02-21 MED ORDER — ONDANSETRON HCL 4 MG/2ML IJ SOLN: 4.0000 mg | Freq: Four times a day (QID) | INTRAMUSCULAR | Status: DC | PRN

## 2023-02-21 MED ORDER — METHOCARBAMOL 500 MG PO TABS: 500.0000 mg | ORAL_TABLET | Freq: Four times a day (QID) | ORAL | Status: DC | PRN

## 2023-02-21 MED ORDER — FENTANYL CITRATE (PF) 250 MCG/5ML IJ SOLN
INTRAMUSCULAR | Status: DC | PRN
  Administered 2023-02-21 (×3): 50 ug via INTRAVENOUS

## 2023-02-21 MED ORDER — DOCUSATE SODIUM 100 MG PO CAPS
100.0000 mg | ORAL_CAPSULE | Freq: Two times a day (BID) | ORAL | Status: DC
  Administered 2023-02-21 – 2023-02-22 (×2): 100 mg via ORAL
  Filled 2023-02-21 (×2): qty 1

## 2023-02-21 MED ORDER — ROCURONIUM BROMIDE 10 MG/ML (PF) SYRINGE
PREFILLED_SYRINGE | INTRAVENOUS | Status: DC | PRN
  Administered 2023-02-21: 50 mg via INTRAVENOUS

## 2023-02-21 MED ORDER — METOCLOPRAMIDE HCL 5 MG/ML IJ SOLN: 5.0000 mg | Freq: Three times a day (TID) | INTRAMUSCULAR | Status: DC | PRN

## 2023-02-21 MED ORDER — TORSEMIDE 20 MG PO TABS
40.0000 mg | ORAL_TABLET | Freq: Every day | ORAL | Status: DC
  Administered 2023-02-21 – 2023-02-22 (×2): 40 mg via ORAL
  Filled 2023-02-21 (×2): qty 2

## 2023-02-21 MED ORDER — TRAZODONE HCL 50 MG PO TABS
200.0000 mg | ORAL_TABLET | Freq: Every day | ORAL | Status: DC
  Administered 2023-02-21: 200 mg via ORAL
  Filled 2023-02-21: qty 4

## 2023-02-21 MED ORDER — BUPIVACAINE-MELOXICAM ER 200-6 MG/7ML IJ SOLN
INTRAMUSCULAR | Status: DC | PRN
  Administered 2023-02-21: 400 mg

## 2023-02-21 MED ORDER — ALBUTEROL SULFATE (2.5 MG/3ML) 0.083% IN NEBU: 3.0000 mL | INHALATION_SOLUTION | Freq: Four times a day (QID) | RESPIRATORY_TRACT | Status: DC | PRN

## 2023-02-21 MED ORDER — ROCURONIUM BROMIDE 10 MG/ML (PF) SYRINGE
PREFILLED_SYRINGE | INTRAVENOUS | Status: AC
  Filled 2023-02-21: qty 10

## 2023-02-21 MED ORDER — MIRABEGRON ER 25 MG PO TB24
50.0000 mg | ORAL_TABLET | Freq: Every day | ORAL | Status: DC
  Administered 2023-02-21 – 2023-02-22 (×2): 50 mg via ORAL
  Filled 2023-02-21 (×2): qty 2

## 2023-02-21 MED ORDER — LIDOCAINE HCL (PF) 2 % IJ SOLN
INTRAMUSCULAR | Status: AC
  Filled 2023-02-21: qty 5

## 2023-02-21 MED ORDER — ATORVASTATIN CALCIUM 40 MG PO TABS
80.0000 mg | ORAL_TABLET | Freq: Every day | ORAL | Status: DC
  Administered 2023-02-21: 80 mg via ORAL
  Filled 2023-02-21: qty 2

## 2023-02-21 MED ORDER — METOCLOPRAMIDE HCL 5 MG/ML IJ SOLN
INTRAMUSCULAR | Status: AC
  Filled 2023-02-21: qty 2

## 2023-02-21 MED ORDER — TRANEXAMIC ACID-NACL 1000-0.7 MG/100ML-% IV SOLN
1000.0000 mg | INTRAVENOUS | Status: AC
  Administered 2023-02-21: 1000 mg via INTRAVENOUS

## 2023-02-21 MED ORDER — BUPIVACAINE-MELOXICAM ER 200-6 MG/7ML IJ SOLN
INTRAMUSCULAR | Status: AC
  Filled 2023-02-21: qty 2

## 2023-02-21 MED ORDER — FLUOXETINE HCL 20 MG PO CAPS
40.0000 mg | ORAL_CAPSULE | Freq: Every day | ORAL | Status: DC
  Administered 2023-02-21 – 2023-02-22 (×2): 40 mg via ORAL
  Filled 2023-02-21 (×2): qty 2

## 2023-02-21 MED ORDER — PHENYLEPHRINE HCL-NACL 20-0.9 MG/250ML-% IV SOLN
INTRAVENOUS | Status: AC
  Filled 2023-02-21: qty 250

## 2023-02-21 MED ORDER — HYDROMORPHONE HCL 1 MG/ML IJ SOLN
INTRAMUSCULAR | Status: DC | PRN
  Administered 2023-02-21 (×2): .5 mg via INTRAVENOUS

## 2023-02-21 MED ORDER — ORAL CARE MOUTH RINSE: 15.0000 mL | Freq: Once | OROMUCOSAL | Status: AC

## 2023-02-21 MED ORDER — PREGABALIN 50 MG PO CAPS
50.0000 mg | ORAL_CAPSULE | Freq: Three times a day (TID) | ORAL | Status: DC
  Administered 2023-02-21 – 2023-02-22 (×4): 50 mg via ORAL
  Filled 2023-02-21 (×3): qty 1

## 2023-02-21 MED ORDER — ACETAMINOPHEN 325 MG PO TABS: 325.0000 mg | ORAL_TABLET | Freq: Four times a day (QID) | ORAL | Status: DC | PRN

## 2023-02-21 MED ORDER — CEFAZOLIN SODIUM-DEXTROSE 2-4 GM/100ML-% IV SOLN
2.0000 g | Freq: Four times a day (QID) | INTRAVENOUS | Status: AC
  Administered 2023-02-21 – 2023-02-22 (×2): 2 g via INTRAVENOUS
  Filled 2023-02-21 (×2): qty 100

## 2023-02-21 MED ORDER — CHLORHEXIDINE GLUCONATE 0.12 % MT SOLN
15.0000 mL | Freq: Once | OROMUCOSAL | Status: AC
  Administered 2023-02-21: 15 mL via OROMUCOSAL

## 2023-02-21 MED ORDER — CEFAZOLIN SODIUM-DEXTROSE 2-4 GM/100ML-% IV SOLN
2.0000 g | INTRAVENOUS | Status: AC
  Administered 2023-02-21: 2 g via INTRAVENOUS

## 2023-02-21 MED ORDER — TRANEXAMIC ACID-NACL 1000-0.7 MG/100ML-% IV SOLN
INTRAVENOUS | Status: AC
  Filled 2023-02-21: qty 100

## 2023-02-21 MED ORDER — PROPOFOL 500 MG/50ML IV EMUL
INTRAVENOUS | Status: AC
  Filled 2023-02-21: qty 100

## 2023-02-21 MED ORDER — ALUM & MAG HYDROXIDE-SIMETH 200-200-20 MG/5ML PO SUSP: 30.0000 mL | ORAL | Status: DC | PRN

## 2023-02-21 MED ORDER — METHOCARBAMOL 1000 MG/10ML IJ SOLN
500.0000 mg | Freq: Four times a day (QID) | INTRAVENOUS | Status: DC
  Administered 2023-02-21 – 2023-02-22 (×3): 500 mg via INTRAVENOUS
  Filled 2023-02-21 (×4): qty 5

## 2023-02-21 MED ORDER — ONDANSETRON HCL 4 MG/2ML IJ SOLN
4.0000 mg | Freq: Four times a day (QID) | INTRAMUSCULAR | Status: DC
  Administered 2023-02-21 – 2023-02-22 (×3): 4 mg via INTRAVENOUS
  Filled 2023-02-21 (×4): qty 2

## 2023-02-21 MED ORDER — ENSURE ENLIVE PO LIQD
237.0000 mL | Freq: Two times a day (BID) | ORAL | Status: DC
  Administered 2023-02-21 – 2023-02-22 (×2): 237 mL via ORAL

## 2023-02-21 MED ORDER — LORATADINE 10 MG PO TABS
10.0000 mg | ORAL_TABLET | Freq: Every day | ORAL | Status: DC
  Administered 2023-02-21 – 2023-02-22 (×2): 10 mg via ORAL
  Filled 2023-02-21 (×2): qty 1

## 2023-02-21 MED ORDER — OXYCODONE HCL 5 MG PO TABS
ORAL_TABLET | ORAL | Status: AC
  Filled 2023-02-21: qty 2

## 2023-02-21 MED ORDER — ONDANSETRON HCL 4 MG/2ML IJ SOLN
INTRAMUSCULAR | Status: AC
  Filled 2023-02-21: qty 2

## 2023-02-21 MED ORDER — PANTOPRAZOLE SODIUM 40 MG PO TBEC
40.0000 mg | DELAYED_RELEASE_TABLET | Freq: Every day | ORAL | Status: DC
  Administered 2023-02-22: 40 mg via ORAL
  Filled 2023-02-21: qty 1

## 2023-02-21 MED ORDER — PREGABALIN 50 MG PO CAPS
ORAL_CAPSULE | ORAL | Status: AC
  Filled 2023-02-21: qty 1

## 2023-02-21 MED ORDER — HYDROMORPHONE HCL 1 MG/ML IJ SOLN
0.2500 mg | INTRAMUSCULAR | Status: DC | PRN
  Administered 2023-02-21 (×2): 0.5 mg via INTRAVENOUS
  Filled 2023-02-21 (×2): qty 0.5

## 2023-02-21 MED ORDER — FENTANYL CITRATE (PF) 100 MCG/2ML IJ SOLN: 100.0000 ug | Freq: Once | INTRAMUSCULAR | Status: DC

## 2023-02-21 MED ORDER — ARIPIPRAZOLE 2 MG PO TABS
2.0000 mg | ORAL_TABLET | Freq: Every day | ORAL | Status: DC
  Administered 2023-02-22: 2 mg via ORAL
  Filled 2023-02-21 (×4): qty 1

## 2023-02-21 MED ORDER — HYDRALAZINE HCL 20 MG/ML IJ SOLN
INTRAMUSCULAR | Status: DC | PRN
  Administered 2023-02-21: 10 mg via INTRAVENOUS

## 2023-02-21 MED ORDER — PROPOFOL 10 MG/ML IV BOLUS
INTRAVENOUS | Status: AC
  Filled 2023-02-21: qty 20

## 2023-02-21 MED ORDER — CELECOXIB 100 MG PO CAPS
200.0000 mg | ORAL_CAPSULE | Freq: Every day | ORAL | Status: DC
  Administered 2023-02-21 – 2023-02-22 (×2): 200 mg via ORAL
  Filled 2023-02-21: qty 2

## 2023-02-21 MED ORDER — FENTANYL CITRATE PF 50 MCG/ML IJ SOSY
50.0000 ug | PREFILLED_SYRINGE | Freq: Once | INTRAMUSCULAR | Status: AC
  Administered 2023-02-21: 50 ug via INTRAVENOUS

## 2023-02-21 MED ORDER — TRANEXAMIC ACID-NACL 1000-0.7 MG/100ML-% IV SOLN
1000.0000 mg | Freq: Once | INTRAVENOUS | Status: AC
  Administered 2023-02-21: 1000 mg via INTRAVENOUS
  Filled 2023-02-21: qty 100

## 2023-02-21 MED ORDER — LACTATED RINGERS IV SOLN: INTRAVENOUS | Status: DC

## 2023-02-21 SURGICAL SUPPLY — 63 items
ATTUNE MED DOME PAT 38 KNEE (Knees) IMPLANT
ATTUNE PS FEM RT SZ 7 CEM KNEE (Femur) IMPLANT
BANDAGE ESMARK 6X9 LF (GAUZE/BANDAGES/DRESSINGS) ×2 IMPLANT
BASEPLATE TIB CMT FB PCKT SZ5 (Knees) IMPLANT
BLADE SAGITTAL 25.0X1.27X90 (BLADE) ×2 IMPLANT
BLADE SAW SGTL 11.0X1.19X90.0M (BLADE) ×2 IMPLANT
BNDG CMPR 9X6 STRL LF SNTH (GAUZE/BANDAGES/DRESSINGS) ×1
BNDG ESMARK 6X9 LF (GAUZE/BANDAGES/DRESSINGS) ×1
BOWL SMART MIX CTS (DISPOSABLE) IMPLANT
BSPLAT TIB 5 CMNT FXBRNG STRL (Knees) ×1 IMPLANT
CEMENT HV SMART SET (Cement) ×4 IMPLANT
CLOTH BEACON ORANGE TIMEOUT ST (SAFETY) ×2 IMPLANT
COOLER ICEMAN CLASSIC (MISCELLANEOUS) ×2 IMPLANT
COVER LIGHT HANDLE STERIS (MISCELLANEOUS) ×4 IMPLANT
CUFF TOURN SGL QUICK 34 (TOURNIQUET CUFF) ×1
CUFF TRNQT CYL 34X4.125X (TOURNIQUET CUFF) ×2 IMPLANT
DRAPE BACK TABLE (DRAPES) ×2 IMPLANT
DRAPE EXTREMITY T 121X128X90 (DISPOSABLE) ×2 IMPLANT
DRESSING AQUACEL AG ADV 3.5X12 (MISCELLANEOUS) ×2 IMPLANT
DRSG AQUACEL AG ADV 3.5X12 (MISCELLANEOUS) ×1
DURAPREP 26ML APPLICATOR (WOUND CARE) ×4 IMPLANT
ELECT REM PT RETURN 9FT ADLT (ELECTROSURGICAL) ×1
ELECTRODE REM PT RTRN 9FT ADLT (ELECTROSURGICAL) ×2 IMPLANT
GLOVE BIO SURGEON STRL SZ7 (GLOVE) IMPLANT
GLOVE BIOGEL PI IND STRL 7.0 (GLOVE) ×6 IMPLANT
GLOVE BIOGEL PI IND STRL 8.5 (GLOVE) ×2 IMPLANT
GLOVE SKINSENSE STRL SZ8.0 LF (GLOVE) ×2 IMPLANT
GOWN STRL REUS W/TWL LRG LVL3 (GOWN DISPOSABLE) ×6 IMPLANT
GOWN STRL REUS W/TWL XL LVL3 (GOWN DISPOSABLE) ×2 IMPLANT
HANDPIECE INTERPULSE COAX TIP (DISPOSABLE) ×1
HOOD W/PEELAWAY (MISCELLANEOUS) ×8 IMPLANT
INSERT TIB ATTUNE FB SZ7X6 (Insert) IMPLANT
INST SET MAJOR BONE (KITS) ×2 IMPLANT
IV NS IRRIG 3000ML ARTHROMATIC (IV SOLUTION) ×2 IMPLANT
KIT BLADEGUARD II DBL (SET/KITS/TRAYS/PACK) ×2 IMPLANT
KIT TURNOVER KIT A (KITS) ×2 IMPLANT
MANIFOLD NEPTUNE II (INSTRUMENTS) ×2 IMPLANT
MARKER SKIN DUAL TIP RULER LAB (MISCELLANEOUS) ×2 IMPLANT
NS IRRIG 1000ML POUR BTL (IV SOLUTION) ×2 IMPLANT
PACK TOTAL JOINT (CUSTOM PROCEDURE TRAY) ×2 IMPLANT
PAD ARMBOARD 7.5X6 YLW CONV (MISCELLANEOUS) ×2 IMPLANT
PAD COLD SHLDR SM WRAP-ON (PAD) ×2 IMPLANT
PILLOW KNEE EXTENSION 0 DEG (MISCELLANEOUS) ×2 IMPLANT
PIN/DRILL PACK ORTHO 1/8X3.0 (PIN) ×2 IMPLANT
POSITIONER HEAD 8X9X4 ADT (SOFTGOODS) ×2 IMPLANT
SAW OSC TIP CART 19.5X105X1.3 (SAW) ×2 IMPLANT
SET BASIN LINEN APH (SET/KITS/TRAYS/PACK) ×2 IMPLANT
SET HNDPC FAN SPRY TIP SCT (DISPOSABLE) ×2 IMPLANT
SOLUTION IRRIG SURGIPHOR (IV SOLUTION) ×2 IMPLANT
SPONGE T-LAP 18X18 ~~LOC~~+RFID (SPONGE) IMPLANT
STAPLER VISISTAT 35W (STAPLE) ×2 IMPLANT
SUT BRALON NAB BRD #1 30IN (SUTURE) ×2 IMPLANT
SUT MNCRL 0 VIOLET CTX 36 (SUTURE) ×2 IMPLANT
SUT MON AB 0 CT1 (SUTURE) ×2 IMPLANT
SUT VIC AB 1 CT1 27 (SUTURE) ×2
SUT VIC AB 1 CT1 27XBRD ANTBC (SUTURE) IMPLANT
SYR BULB IRRIG 60ML STRL (SYRINGE) ×2 IMPLANT
TOWEL OR 17X26 4PK STRL BLUE (TOWEL DISPOSABLE) ×2 IMPLANT
TOWER CARTRIDGE SMART MIX (DISPOSABLE) ×2 IMPLANT
TRAY FOLEY MTR SLVR 16FR STAT (SET/KITS/TRAYS/PACK) ×2 IMPLANT
TUBE SUCTION HIGH CAP CLEAR NV (SUCTIONS) IMPLANT
WATER STERILE IRR 1000ML POUR (IV SOLUTION) ×4 IMPLANT
YANKAUER SUCT 12FT TUBE ARGYLE (SUCTIONS) ×2 IMPLANT

## 2023-02-21 NOTE — Transfer of Care (Signed)
Immediate Anesthesia Transfer of Care Note  Patient: JONELLE GREENSLADE  Procedure(s) Performed: TOTAL KNEE ARTHROPLASTY (Right: Knee)  Patient Location: PACU  Anesthesia Type:General  Level of Consciousness: awake  Airway & Oxygen Therapy: Patient Spontanous Breathing and Patient connected to face mask oxygen  Post-op Assessment: Report given to RN and Post -op Vital signs reviewed and stable  Post vital signs: Reviewed and stable  Last Vitals:  Vitals Value Taken Time  BP 150/72 02/21/23 1036  Temp    Pulse 89 02/21/23 1037  Resp 17 02/21/23 1037  SpO2 96 % 02/21/23 1037  Vitals shown include unfiled device data.  Last Pain:  Vitals:   02/21/23 0645  PainSc: 7          Complications: No notable events documented.

## 2023-02-21 NOTE — Anesthesia Postprocedure Evaluation (Signed)
Anesthesia Post Note  Patient: [name]  Procedure(s) Performed: TOTAL KNEE ARTHROPLASTY (Right: Knee)  Patient location during evaluation: Phase II Anesthesia Type: General Level of consciousness: awake and alert and oriented Pain management: pain level controlled Vital Signs Assessment: post-procedure vital signs reviewed and stable Respiratory status: spontaneous breathing, nonlabored ventilation and respiratory function stable Cardiovascular status: blood pressure returned to baseline and stable Postop Assessment: no apparent nausea or vomiting Anesthetic complications: no  No notable events documented.   Last Vitals:  Vitals:   02/21/23 1130 02/21/23 1145  BP: 126/63 (!) 132/49  Pulse: 93 85  Resp: 17 (!) 24  Temp:  36.4 C  SpO2: 90% 94%    Last Pain:  Vitals:   02/21/23 1145  PainSc: 0-No pain                  C 

## 2023-02-21 NOTE — Op Note (Signed)
Dictation for total knee replacement  02/21/2023  10:41 AM  Orthopaedic Surgery Operative Note (CSN: [id number])  [name]  06-25-1961 Date of Surgery: 02/21/2023   Diagnoses:  right knee osteoarthritis  Procedure: RIGHT Total knee arthroplasty   Operative Finding OA MEDIAL 4 OA PATELLA 4  ANTERIOR HORN MEDIAL MENISCUS SMALL ? PRIOR SURGERY   LATERAL SIDE NORMAL   TROCHLEA NORMAL    Post-Op Diagnosis: Same Surgeons:Primary: Vickki Hearing, MD Assistants: Cecile Sheerer Location: [facility] OR ROOM 4 Anesthesia: GENERAL   Antibiotics: Ancef 2 g Tourniquet time:  Total Tourniquet Time Documented: Thigh (Right) - 123 minutes Total: Thigh (Right) - 123 minutes   Estimated Blood Loss: 5-10 CC Complications: None HOWEVER CEMENT SET UP QUICKLY AND A SECOND BATCH WAS DONE FOR THE PATELLA  Specimens: None   Implants: Implant Name Type Inv. Item Serial No. Manufacturer Lot No. LRB No. Used Action  CEMENT HV SMART SET - [id number] Cement CEMENT HV SMART SET  [employer] [id number] Right 3 Implanted  INSERT TIB ATTUNE FB SZ7X6 - [id number] Insert INSERT TIB ATTUNE FB SZ7X6  DEPUY ORTH[employer] [id number] Right 1 Implanted  ATTUNE MED DOME PAT 38 KNEE - [id number] Knees ATTUNE MED DOME PAT 38 KNEE  [employer]PU[employer] [id number] Right 1 Implanted  ATTUNE PS FEM RT SZ 7 CEM KNEE - [id number] Femur ATTUNE PS FEM RT SZ 7 CEM KNEE  [employer] [id number] Right 1 Implanted  BSPLAT TIB 5 CMNT FXBRNG STRL - [id number] Knees BSPLAT TIB 5 CMNT FXBRNG STRL  DEPU[employer] ORTH[employer] [id number] Right 1 Implanted     Indications for Surgery: [name] is status post left total knee arthroplasty and presented on this occasion for right total knee arthroplasty.  She had disabling right knee pain failed nonoperative treatment and after discussions of further medical intervention she opted for right total knee arthroplasty  The procedure has been fully reviewed with the patient; The risks and  benefits of surgery have been discussed and explained and understood. Alternative treatment has also been reviewed, questions were encouraged and answered. The postoperative plan is also been reviewed.   Procedure:   The patient was identified by 2 approved identification mechanisms. The operative extremity was evaluated and found to be acceptable for surgical treatment today. The chart was reviewed. The surgical site was confirmed and marked.  she had a preop saphenous nerve block or chart review was completed imaging was also reviewed as well as previous operative notes indicating a size 5 femur and size 5 tibia with a 8 mm poly insert and 38 patella  Details of surgery:   The patient was taken to the operating room and given appropriate antibiotic 2 g Ancef. This is consistent with the SCIP protocol.  The patient was given the following anesthetic: General anesthesia  The patient was then placed supine on the operating table. A Foley catheter was inserted. The operative extremity was prepped and draped sterilely from the toes to the groin.  Timeout was executed confirming the patient's name, surgical site, antibiotic administration, x-rays available, and implants available.  The operative limb,  was exsanguinated with a six-inch Esmarch and the tourniquet was inflated to 300 mmHg.  A straight midline incision was made over the right KNEE and taken down to the extensor mechanism.  The knee was placed into flexion  A medial arthrotomy was performed. The patella was everted and the patellofemoral soft tissue was released, along with the patellar fat pad.  The anterior cruciate ligament and PCL  were resected.  The anterior horns of the lateral and medial meniscus were resected. The medial soft tissue sleeve was elevated to the mid coronal plane.  A three-eighths inch drill bit was used to enter the femoral canal which was decompressed with suction and irrigation until clear.   The distal  femoral cutting guide was set for 10 mm distal resection,  5valgus alignment, for a right knee. The distal femur was resected and checked for flatness.  The attune sizing femoral guide was placed and the femur was preliminarily sized to a size 7.  This was checked several times as the opposite knee only required a size 5 femur  We went ahead and placed the cutting block for the distal femur using the angel wing and displacing the femur inferiorly 1.5 mm.   The 4 additional distal femoral cuts were made  The external alignment guide for the tibial resection was then applied to the distal and proximal tibia and set for anatomic slope along with 3 MM resection  from the medial.   Rotational alignment was set using the malleolus, the tibial tubercle and the tibial spines.  The proximal tibia was resected along with  residual menisci. The tibia was sized using a base plate to a size 5.   The extension gap was checked.  5 and 6 mm blocks fit.  The 6 fit the best   The correct sized notch cutting guide for the femur was then applied and the notch cut was made.  Trial reduction was completed using size 5 tibia 7 femur 6 polyethylene insert trial implants. Patella tracking was normal  We then skeletonized the patella. It measured 21.5 in thickness and the patellar resection was set for resection of 9.5 millimeters. the patellar resection was completed. The patella diameter measured 38. We then drilled the peg holes for the patella.   The proximal tibia was prepared using the size 5 base plate.  Thorough irrigation was performed using saline  and the bone was dried and prepared for cement. The cement was mixed on the back table using third generation preparation techniques  The implants were then cemented in place and excess cement was removed. The cement was allowed to cure. Surgipor irrigation was placed followed by saline irrigation.  The cement set up too quickly to place the patella so an  additional batch of cement was mixed and the patella was cemented into place  Any excess bone fragments and cement was removed.  The extensor mechanism was closed with #1 Bralon suture followed by subcutaneous tissue closure using 0 Monocryl suture in interrupted fashion   Zynrelef a total of 2 vials were injected prior to complete extensor mechanism closure.  The openings and the capsule were then closed with #1 Braylon   Skin approximation was performed using staples  A sterile dressing was applied, TED hose were placed on the operative extremity followed by Cryo/Cuff.  The patient was taken recovery room in stable condition  Postop plan: Weightbearing as tolerated  Immediate physical therapy Discharge tomorrow if stable Aspirin twice a day for DVT prevention

## 2023-02-21 NOTE — Interval H&P Note (Signed)
History and Physical Interval Note:  02/21/2023 7:24 AM  [name]  has presented today for surgery, with the diagnosis of right knee osteoarthritis.  The various methods of treatment have been discussed with the patient and family. After consideration of risks, benefits and other options for treatment, the patient has consented to  Procedure(s): TOTAL KNEE ARTHROPLASTY (Right) as a surgical intervention.  The patient's history has been reviewed, patient examined, no change in status, stable for surgery.  I have reviewed the patient's chart and labs.  Questions were answered to the patient's satisfaction.     Fuller Canada

## 2023-02-21 NOTE — Plan of Care (Signed)
  Problem: Acute Rehab PT Goals(only PT should resolve) Goal: Pt Will Go Supine/Side To Sit Outcome: Progressing Flowsheets (Taken 02/21/2023 1616) Pt will go Supine/Side to Sit:  Independently  with modified independence Goal: Patient Will Transfer Sit To/From Stand Outcome: Progressing Flowsheets (Taken 02/21/2023 1616) Patient will transfer sit to/from stand: with modified independence Goal: Pt Will Transfer Bed To Chair/Chair To Bed Outcome: Progressing Flowsheets (Taken 02/21/2023 1616) Pt will Transfer Bed to Chair/Chair to Bed: with modified independence Goal: Pt Will Ambulate Outcome: Progressing Flowsheets (Taken 02/21/2023 1616) Pt will Ambulate:  > 125 feet  with modified independence  with rolling walker   4:16 PM, 02/21/23 Ocie Bob, MPT Physical Therapist with [facility] [phone] office [phone] mobile phone

## 2023-02-21 NOTE — Anesthesia Procedure Notes (Signed)
Procedure Name: Intubation Date/Time: 02/21/2023 7:46 AM  Performed by: Julian Reil, CRNAPre-anesthesia Checklist: Patient identified, Emergency Drugs available, Suction available and Patient being monitored Patient Re-evaluated:Patient Re-evaluated prior to induction Oxygen Delivery Method: Circle system utilized Preoxygenation: Pre-oxygenation with 100% oxygen Induction Type: IV induction, Rapid sequence and Cricoid Pressure applied Laryngoscope Size: Glidescope and 3 Grade View: Grade I Tube type: Oral Tube size: 7.5 mm Number of attempts: 1 Airway Equipment and Method: Stylet and Video-laryngoscopy Placement Confirmation: ETT inserted through vocal cords under direct vision, positive ETCO2 and breath sounds checked- equal and bilateral Secured at: 22 cm Tube secured with: Tape Dental Injury: Injury to lip  Comments: RSI planned and Glidescope electively used due to H/O uncontrolled GERD, lethargic from Pre-op sedation.  Noted scant amount of blood right upper lip after DL, pressure held.

## 2023-02-21 NOTE — Brief Op Note (Signed)
02/21/2023  10:32 AM  PATIENT:  Dorena Cookey  62 y.o. female  PRE-OPERATIVE DIAGNOSIS:  right knee osteoarthritis  POST-OPERATIVE DIAGNOSIS:  right knee osteoarthritis  PROCEDURE:  Procedure(s): TOTAL KNEE ARTHROPLASTY (Right)  SURGEON:  Surgeons and Role:    Vickki Hearing, MD - Primary  PHYSICIAN ASSISTANT:   ASSISTANTS: CYNTHIA WRENN   ANESTHESIA:   general  EBL:  NONE  BLOOD ADMINISTERED:none  DRAINS: none   LOCAL MEDICATIONS USED:  OTHER ZINRELEF  SPECIMEN:  No Specimen  DISPOSITION OF SPECIMEN:  N/A  COUNTS:  YES  TOURNIQUET:   Total Tourniquet Time Documented: Thigh (Right) - 123 minutes Total: Thigh (Right) - 123 minutes   DICTATION: .Reubin Milan Dictation  PLAN OF CARE: Admit to inpatient   PATIENT DISPOSITION:  PACU - hemodynamically stable.   Delay start of Pharmacological VTE agent (>24hrs) due to surgical blood loss or risk of bleeding: not applicable

## 2023-02-21 NOTE — Anesthesia Preprocedure Evaluation (Addendum)
Anesthesia Evaluation  Patient identified by MRN, date of birth, ID band Patient awake    Reviewed: Allergy & Precautions, H&P , NPO status , Patient's Chart, lab work & pertinent test results, reviewed documented beta blocker date and time   Airway Mallampati: II  TM Distance: >3 FB Neck ROM: Full   Comment: Cervical radiculopathy Dental  (+) Edentulous Upper, Edentulous Lower   Pulmonary shortness of breath and with exertion, asthma , former smoker   Pulmonary exam normal breath sounds clear to auscultation       Cardiovascular Exercise Tolerance: Good hypertension, Pt. on medications and Pt. on home beta blockers + CAD and +CHF  Normal cardiovascular exam Rhythm:Regular Rate:Normal   1. Left ventricular ejection fraction, by estimation, is 60 to 65%. The  left ventricle has normal function. The left ventricle has no regional  wall motion abnormalities. Left ventricular diastolic parameters are  consistent with Grade I diastolic  dysfunction (impaired relaxation).   2. Right ventricular systolic function is normal. The right ventricular  size is normal.   3. The mitral valve is normal in structure. Trivial mitral valve  regurgitation. No evidence of mitral stenosis.   4. The aortic valve is tricuspid. There is mild calcification of the  aortic valve. Aortic valve regurgitation is trivial. No aortic stenosis is  present.   5. The inferior vena cava is normal in size with greater than 50%  respiratory variability, suggesting right atrial pressure of 3 mmHg.      Mid LAD lesion is 40% stenosed.   1st Mrg lesion is 25% stenosed.   1.  Mild obstructive coronary artery disease with RFR of 1.0 in the LAD and RFR of 0.97 in the left circumflex; RFR of the right coronary artery was not pursued given patient intolerance of adenosine for coronary microvascular dysfunction assessment (see procedural notes). 2.  Index of microvascular  resistance of 13 with normal being less than 25 and coronary flow reserve of 1.9 with normal being greater than 2.0. 3.  Severely elevated LVEDP of 32 mmHg.   Recommendation: Diuretics will be initiated on discharge.         Neuro/Psych  PSYCHIATRIC DISORDERS Anxiety Depression Bipolar Disorder    Neuromuscular disease (right knee cap numbness, intermittient foot numbness)    GI/Hepatic Neg liver ROS,GERD  Medicated and Controlled,,  Endo/Other  diabetes, Well Controlled, Type 2, Oral Hypoglycemic Agents    Renal/GU negative Renal ROS  negative genitourinary   Musculoskeletal  (+) Arthritis  (lumbar fusion), Osteoarthritis,    Abdominal   Peds negative pediatric ROS (+)  Hematology negative hematology ROS (+)   Anesthesia Other Findings   Reproductive/Obstetrics negative OB ROS                             Anesthesia Physical Anesthesia Plan  ASA: 3  Anesthesia Plan: General   Post-op Pain Management: Dilaudid IV and Regional block*   Induction: Intravenous  PONV Risk Score and Plan: 4 or greater and Ondansetron, Dexamethasone and Midazolam  Airway Management Planned: Oral ETT  Additional Equipment:   Intra-op Plan:   Post-operative Plan: Extubation in OR  Informed Consent: I have reviewed the patients History and Physical, chart, labs and discussed the procedure including the risks, benefits and alternatives for the proposed anesthesia with the patient or authorized representative who has indicated his/her understanding and acceptance.     Dental advisory given  Plan Discussed with: CRNA and  Surgeon  Anesthesia Plan Comments:         Anesthesia Quick Evaluation

## 2023-02-21 NOTE — Progress Notes (Signed)
Instructed on incentive spirometer. 1500 ml obtained. Tolerated well. 

## 2023-02-21 NOTE — Evaluation (Signed)
Physical Therapy Evaluation Patient Details Name: [name] MRN: [id number] DOB: 05-13-1961 Today's Date: 02/21/2023   RIGHT KNEE ROM:  0 - 5 degrees AMBULATION DISTANCE: 65 feet using RW with Supervision    History of Present Illness  [name] is a 62 y/o female, s/p Right TKA, with the diagnosis of right knee osteoarthritis.  Clinical Impression  Patient instructed in and given written instructions for HEP, demonstrates fair/good return for moving RLE during bed mobility, able to ambulate in hallway with fair carryover for right heel to toe stepping without loss of balance and tolerated sitting up in chair with RLE dangling after therapy - nurse notified.  Patient will benefit from continued skilled physical therapy in hospital and recommended venue below to increase strength, balance, endurance for safe ADLs and gait.         If plan is discharge home, recommend the following: A little help with walking and/or transfers;A little help with bathing/dressing/bathroom;Help with stairs or ramp for entrance;Assistance with cooking/housework   Can travel by private vehicle        Equipment Recommendations None recommended by PT  Recommendations for Other Services       Functional Status Assessment Patient has had a recent decline in their functional status and demonstrates the ability to make significant improvements in function in a reasonable and predictable amount of time.     Precautions / Restrictions Precautions Precautions: Fall Restrictions Weight Bearing Restrictions: Yes RLE Weight Bearing: Weight bearing as tolerated      Mobility  Bed Mobility Overal bed mobility: Needs Assistance Bed Mobility: Supine to Sit     Supine to sit: Supervision, Modified independent (Device/Increase time)     General bed mobility comments: good return for moving RLE during bed  mobility    Transfers Overall transfer level: Needs assistance Equipment used:  Rolling walker (2 wheels) Transfers: Sit to/from Stand, Bed to chair/wheelchair/BSC Sit to Stand: Supervision   Step pivot transfers: Supervision       General transfer comment: slightly labored movement without loss of balance using RW    Ambulation/Gait Ambulation/Gait assistance: Supervision, Contact guard assist Gait Distance (Feet): 65 Feet Assistive device: Rolling walker (2 wheels) Gait Pattern/deviations: Decreased step length - right, Decreased step length - left, Antalgic, Decreased stride length Gait velocity: decreased     General Gait Details: slow slightly labored cadence with fair return for right heel to toe stepping without loss of balance, limited mostly due to fatigue and minor increase in right hip pain  Stairs            Wheelchair Mobility     Tilt Bed    Modified Rankin (Stroke Patients Only)       Balance Overall balance assessment: Needs assistance Sitting-balance support: Feet supported, No upper extremity supported Sitting balance-Leahy Scale: Good Sitting balance - Comments: seated at EOB   Standing balance support: Reliant on assistive device for balance, Bilateral upper extremity supported, During functional activity Standing balance-Leahy Scale: Fair Standing balance comment: fair/good using RW                             Pertinent Vitals/Pain Pain Assessment Pain Assessment: Faces Faces Pain Scale: Hurts little more Pain Location: right knee Pain Descriptors / Indicators: Sore, Grimacing Pain Intervention(s): Limited activity within patient's tolerance, Monitored during session, Premedicated before session, Repositioned, Ice applied    Home Living Family/patient expects to be discharged to:: Private  residence Living Arrangements: Spouse/significant other Available Help at Discharge: Family;Available 24 hours/day Type of Home: House Home Access: Stairs to enter Entrance Stairs-Rails: Left Entrance Stairs-Number  of Steps: 3   Home Layout: One level Home Equipment: Rolling Walker (2 wheels);BSC/3in1;Shower seat;Grab bars - tub/shower;Cane - single point      Prior Function Prior Level of Function : Independent/Modified Independent             Mobility Comments: Community ambulation with SPC, drives ADLs Comments: Independent     Extremity/Trunk Assessment   Upper Extremity Assessment Upper Extremity Assessment: Overall WFL for tasks assessed    Lower Extremity Assessment Lower Extremity Assessment: Overall WFL for tasks assessed;RLE deficits/detail RLE Deficits / Details: grossly -4/5 RLE Sensation: WNL RLE Coordination: WNL    Cervical / Trunk Assessment Cervical / Trunk Assessment: Normal  Communication   Communication Communication: No apparent difficulties  Cognition Arousal: Alert Behavior During Therapy: WFL for tasks assessed/performed Overall Cognitive Status: Within Functional Limits for tasks assessed                                          General Comments      Exercises Total Joint Exercises Ankle Circles/Pumps: Supine, 10 reps, Right, Strengthening, AROM Quad Sets: AROM, Strengthening, Right, 10 reps, Supine Short Arc Quad: AROM, Strengthening, Right, 10 reps, Supine Heel Slides: AROM, Strengthening, Right, 10 reps, Supine Goniometric ROM: Right knee: 0 - 105 degrees   Assessment/Plan    PT Assessment Patient needs continued PT services  PT Problem List Decreased strength;Decreased activity tolerance;Decreased balance;Decreased mobility       PT Treatment Interventions DME instruction;Gait training;Stair training;Functional mobility training;Therapeutic activities;Therapeutic exercise;Balance training;Patient/family education    PT Goals (Current goals can be found in the Care Plan section)  Acute Rehab PT Goals Patient Stated Goal: return home with family to assist PT Goal Formulation: With patient Time For Goal Achievement:  02/23/23 Potential to Achieve Goals: Good    Frequency BID     Co-evaluation               AM-PAC PT "6 Clicks" Mobility  Outcome Measure Help needed turning from your back to your side while in a flat bed without using bedrails?: None Help needed moving from lying on your back to sitting on the side of a flat bed without using bedrails?: A Little Help needed moving to and from a bed to a chair (including a wheelchair)?: A Little Help needed standing up from a chair using your arms (e.g., wheelchair or bedside chair)?: A Little Help needed to walk in hospital room?: A Little Help needed climbing 3-5 steps with a railing? : A Little 6 Click Score: 19    End of Session   Activity Tolerance: Patient tolerated treatment well;Patient limited by fatigue Patient left: in chair;with call bell/phone within reach Nurse Communication: Mobility status PT Visit Diagnosis: Unsteadiness on feet (R26.81);Other abnormalities of gait and mobility (R26.89);Muscle weakness (generalized) (M62.81)    Time: 1914-7829 PT Time Calculation (min) (ACUTE ONLY): 26 min   Charges:   PT Evaluation $PT Eval Moderate Complexity: 1 Mod PT Treatments $Therapeutic Activity: 23-37 mins PT General Charges $$ ACUTE PT VISIT: 1 Visit         4:14 PM, 02/21/23 Ocie Bob, MPT Physical Therapist with [facility] 336 [phone] office [phone] mobile phone

## 2023-02-22 ENCOUNTER — Encounter (HOSPITAL_COMMUNITY): Payer: Self-pay | Admitting: Orthopedic Surgery

## 2023-02-22 ENCOUNTER — Ambulatory Visit: Payer: 59 | Admitting: Cardiovascular Disease

## 2023-02-22 DIAGNOSIS — M1711 Unilateral primary osteoarthritis, right knee: Secondary | ICD-10-CM | POA: Diagnosis not present

## 2023-02-22 LAB — GLUCOSE, CAPILLARY
Glucose-Capillary: 106 mg/dL — ABNORMAL HIGH (ref 70–99)
Glucose-Capillary: 117 mg/dL — ABNORMAL HIGH (ref 70–99)

## 2023-02-22 MED ORDER — BISACODYL 5 MG PO TBEC: 5.0000 mg | DELAYED_RELEASE_TABLET | Freq: Every day | ORAL | 0 refills | Status: DC | PRN

## 2023-02-22 MED ORDER — ASPIRIN 81 MG PO CHEW: 81.0000 mg | CHEWABLE_TABLET | Freq: Two times a day (BID) | ORAL | 0 refills | Status: DC

## 2023-02-22 MED ORDER — ROPIVACAINE HCL 5 MG/ML IJ SOLN
INTRAMUSCULAR | Status: DC | PRN
  Administered 2023-02-21: 28 mL via PERINEURAL

## 2023-02-22 MED ORDER — OXYCODONE HCL 10 MG PO TABS: 10.0000 mg | ORAL_TABLET | ORAL | 0 refills | Status: AC | PRN

## 2023-02-22 MED ORDER — METHOCARBAMOL 500 MG PO TABS: 500.0000 mg | ORAL_TABLET | Freq: Four times a day (QID) | ORAL | 2 refills | Status: DC | PRN

## 2023-02-22 MED ORDER — LIDOCAINE HCL (PF) 1 % IJ SOLN
INTRAMUSCULAR | Status: DC | PRN
  Administered 2023-02-21: 4 mL

## 2023-02-22 MED ORDER — DEXAMETHASONE SODIUM PHOSPHATE 4 MG/ML IJ SOLN
INTRAMUSCULAR | Status: DC | PRN
  Administered 2023-02-21: 8 mg via PERINEURAL

## 2023-02-22 NOTE — Addendum Note (Signed)
Addendum  created 02/22/23 1047 by Molli Barrows, MD   Intraprocedure Event edited, Intraprocedure Meds edited

## 2023-02-22 NOTE — Discharge Summary (Signed)
Physician Discharge Summary  Patient ID: [name] MRN: [id number] DOB/AGE: 61/19/1962 61 y.o.  Admit date: 02/21/2023 Discharge date: 02/22/2023  Admission Diagnoses: oa right knee   Discharge Diagnoses: same   Discharged Condition: good  Procedure: rt tka  Hospital Course:  Implant Name Type Inv. Item Serial No. Manufacturer Lot No. LRB No. Used Action  CEMENT HV SMART SET - [id number] Cement CEMENT HV SMART SET  [employer] [id number] Right 3 Implanted  INSERT TIB ATTUNE FB SZ7X6 - [id number] Insert INSERT TIB ATTUNE FB SZ7X6  [employer] [id number] Right 1 Implanted  ATTUNE MED DOME PAT 38 KNEE - [id number] Knees ATTUNE MED DOME PAT 38 KNEE  [employer] [id number] Right 1 Implanted  ATTUNE PS FEM RT SZ 7 CEM KNEE - [id number] Femur ATTUNE PS FEM RT SZ 7 CEM KNEE  [employer] [id number] Right 1 Implanted  BSPLAT TIB 5 CMNT FXBRNG STRL - [id number] Knees BSPLAT TIB 5 CMNT FXBRNG STRL  [employer] [id number] Right 1 Implanted         Discharge Exam: BP (!) 118/51 (BP Location: Right Arm)   Pulse 77   Temp 98 F (36.7 C)   Resp 18   SpO2 95%  Physical Exam  [name] is awake and alert a little sluggish this morning but recovered nicely  Calfs are nontender  No ankle edema  Dry dressing  Neurovascular exam intact     Latest Ref Rng & Units 02/22/2023    4:38 AM 02/14/2023    9:02 AM 10/17/2022    1:21 PM  CBC  WBC 4.0 - 10.5 K/uL 15.1  12.6  10.8   Hemoglobin 12.0 - 15.0 g/dL 9.2  70.6  23.7   Hematocrit 36.0 - 46.0 % 31.2  37.1  34.5   Platelets 150 - 400 K/uL 227  306  293       Latest Ref Rng & Units 02/22/2023    4:38 AM 02/14/2023    9:02 AM 01/13/2023    8:48 AM  BMP  Glucose 70 - 99 mg/dL 628  69  315   BUN 6 - 20 mg/dL 12  11  7    Creatinine 0.44 - 1.00 mg/dL 1.76  1.60  7.37   BUN/Creat Ratio 12 - 28   8   Sodium 135 - 145 mmol/L 131  137  141   Potassium 3.5 - 5.1 mmol/L 3.6  4.0  3.9   Chloride 98 - 111 mmol/L 98  97  97    CO2 22 - 32 mmol/L 24  28  27    Calcium 8.9 - 10.3 mg/dL 8.2  9.1  9.1      Disposition: Discharge disposition: 01-Home or Self Care       Discharge Instructions     Call MD / Call 911   Complete by: As directed    If you experience chest pain or shortness of breath, CALL 911 and be transported to the hospital emergency room.  If you develope a fever above 101 F, pus (white drainage) or increased drainage or redness at the wound, or calf pain, call your surgeon's office.   Constipation Prevention   Complete by: As directed    Drink plenty of fluids.  Prune juice may be helpful.  You may use a stool softener, such as Colace (over the counter) 100 mg twice a day.  Use MiraLax (over the counter) for constipation as needed.   Diet - low sodium heart healthy   Complete  by: As directed    Discharge instructions   Complete by: As directed    Cpm machine 4 hrs per day; start at 0-90   Blue foam pillow 30 min 3 x a day   Dressing keep dry do not remove   Ice pad 6 x a day for 30 minutes   No shower   Increase activity slowly as tolerated   Complete by: As directed    Post-operative opioid taper instructions:   Complete by: As directed    POST-OPERATIVE OPIOID TAPER INSTRUCTIONS: It is important to wean off of your opioid medication as soon as possible. If you do not need pain medication after your surgery it is ok to stop day one. Opioids include: Codeine, Hydrocodone(Norco, Vicodin), Oxycodone(Percocet, oxycontin) and hydromorphone amongst others.  Long term and even short term use of opiods can cause: Increased pain response Dependence Constipation Depression Respiratory depression And more.  Withdrawal symptoms can include Flu like symptoms Nausea, vomiting And more Techniques to manage these symptoms Hydrate well Eat regular healthy meals Stay active Use relaxation techniques(deep breathing, meditating, yoga) Do Not substitute Alcohol to help with tapering If  you have been on opioids for less than two weeks and do not have pain than it is ok to stop all together.  Plan to wean off of opioids This plan should start within one week post op of your joint replacement. Maintain the same interval or time between taking each dose and first decrease the dose.  Cut the total daily intake of opioids by one tablet each day Next start to increase the time between doses. The last dose that should be eliminated is the evening dose.         Allergies as of 02/22/2023   No Known Allergies      Medication List     STOP taking these medications    acetaminophen 325 MG tablet Commonly known as: TYLENOL   aspirin EC 81 MG tablet Replaced by: aspirin 81 MG chewable tablet   oxyCODONE-acetaminophen 7.5-325 MG tablet Commonly known as: PERCOCET       TAKE these medications    albuterol 108 (90 Base) MCG/ACT inhaler Commonly known as: VENTOLIN HFA Inhale 1-2 puffs into the lungs every 6 (six) hours as needed for wheezing or shortness of breath.   ARIPiprazole 2 MG tablet Commonly known as: ABILIFY Take 2 mg by mouth daily.   aspirin 81 MG chewable tablet Chew 1 tablet (81 mg total) by mouth 2 (two) times daily. Replaces: aspirin EC 81 MG tablet   atorvastatin 80 MG tablet Commonly known as: LIPITOR Take 1 tablet (80 mg total) by mouth daily. What changed: when to take this   bisacodyl 5 MG EC tablet Commonly known as: DULCOLAX Take 1 tablet (5 mg total) by mouth daily as needed for moderate constipation.   estradiol 0.1 MG/GM vaginal cream Commonly known as: ESTRACE Discard plastic applicator. Insert a blueberry size amount (approximately 1 gram) of cream on fingertip inside vagina at bedtime every night for 1 week then 2 nights per week for long term use.   estradiol 2 MG tablet Commonly known as: ESTRACE TAKE ONE TABLET AT BEDTIME   FLUoxetine 40 MG capsule Commonly known as: PROZAC Take 40 mg by mouth daily.   gabapentin 300  MG capsule Commonly known as: NEURONTIN Take 1 capsule (300 mg total) by mouth 3 (three) times daily. What changed:  when to take this additional instructions   loratadine 10 MG tablet  Commonly known as: CLARITIN Take 10 mg by mouth daily.   metFORMIN 500 MG tablet Commonly known as: GLUCOPHAGE Take 500 mg by mouth daily with breakfast.   methocarbamol 500 MG tablet Commonly known as: ROBAXIN Take 1 tablet (500 mg total) by mouth every 6 (six) hours as needed for muscle spasms.   metoprolol tartrate 25 MG tablet Commonly known as: LOPRESSOR Take 1 tablet (25 mg total) by mouth 2 (two) times daily.   mirabegron ER 25 MG Tb24 tablet Commonly known as: MYRBETRIQ Take 2 tablets (50 mg total) by mouth daily.   nitroGLYCERIN 0.4 MG SL tablet Commonly known as: NITROSTAT Place 1 tablet (0.4 mg total) under the tongue every 5 (five) minutes as needed for chest pain.   omeprazole 40 MG capsule Commonly known as: PRILOSEC Take 1 capsule (40 mg total) by mouth daily.   Oxycodone HCl 10 MG Tabs Take 1 tablet (10 mg total) by mouth every 4 (four) hours as needed for up to 5 days.   oxymetazoline 0.05 % nasal spray Commonly known as: AFRIN Place 1 spray into both nostrils 2 (two) times daily as needed for congestion.   potassium chloride SA 20 MEQ tablet Commonly known as: Klor-Con M20 Take 1 tablet (20 mEq total) by mouth daily.   spironolactone 25 MG tablet Commonly known as: ALDACTONE Take 1 tablet (25 mg total) by mouth daily.   Synjardy XR 12.11-998 MG Tb24 Generic drug: Empagliflozin-metFORMIN HCl ER Take 1 tablet by mouth daily.   torsemide 20 MG tablet Commonly known as: DEMADEX Take 2 tablets (40 mg total) by mouth daily.   traZODone 100 MG tablet Commonly known as: DESYREL Take 200 mg by mouth at bedtime.        Follow-up Information     Vickki Hearing, MD Follow up on 03/09/2023.   Specialties: Orthopedic Surgery, Radiology Why: For wound  re-check Contact information: [address] [place] [address] [phone]                 Signed: Fuller Canada 02/22/2023, 1:38 PM

## 2023-02-22 NOTE — Addendum Note (Signed)
Addendum  created 02/22/23 1045 by Molli Barrows, MD   Child order released for a procedure order, Clinical Note Signed, Intraprocedure Blocks edited, Intraprocedure Event edited, Intraprocedure Meds edited, SmartForm saved

## 2023-02-22 NOTE — Progress Notes (Addendum)
Patient has been stable this shift.  Patient was c/o numbness in right lower extremity, but has rated pain a 9/10 majority of shift.  Patient has had good capillary refill in lower extremity and has been able to move/manipulate right lower leg.  Towards end of shift patient states now her leg feels " sore" with pain as well.  Urinary output has been adequate.  Systolic bp was a soft, but patient was alert and maintained orientation with no adverse effects. Patient foley removed and patient ambulated to restroom with assistance and voided.

## 2023-02-22 NOTE — TOC Transition Note (Signed)
Transition of Care [facility]) - CM/SW Discharge Note   Patient Details  Name: [name] MRN: [id number] Date of Birth: [birth year]  Transition of Care [facility]) CM/SW Contact:  Villa Herb, LCSWA Phone Number: 02/22/2023, 9:22 AM   Clinical Narrative:    CSW updated that pt will need HH PT at D/C. CSW spoke to [place] with [facility] who states that he has pts referral from Dr. Mort Sawyers office and is following. CSW updated Clifton Custard that per MD note pt will likely D/C later today. [facility] will follow for [facility] TOC signing off.   Final next level of care: Home w Home Health Services Barriers to Discharge: No Barriers Identified   Patient Goals and CMS Choice CMS Medicare.gov Compare Post Acute Care list provided to:: Patient Choice offered to / list presented to : Patient  Discharge Placement                         Discharge Plan and Services Additional resources added to the After Visit Summary for                            [facility] Arranged: PT [facility] Agency: [facility] Date [facility] Agency Contacted: 02/22/23   Representative spoke with at [facility] Agency: Clifton Custard  Social Determinants of Health (SDOH) Interventions SDOH Screenings   Food Insecurity: No Food Insecurity (02/21/2023)  Housing: Low Risk  (02/21/2023)  Transportation Needs: No Transportation Needs (02/21/2023)  Utilities: Not At Risk (02/21/2023)  Alcohol Screen: Low Risk  (10/06/2022)  Depression (PHQ2-9): Medium Risk (10/06/2022)  Financial Resource Strain: Low Risk  (10/06/2022)  Physical Activity: Inactive (10/06/2022)  Social Connections: Socially Isolated (10/06/2022)  Stress: No Stress Concern Present (10/06/2022)  Tobacco Use: Medium Risk (02/07/2023)     Readmission Risk Interventions     No data to display

## 2023-02-22 NOTE — Progress Notes (Signed)
Patient ID: [name], female   DOB: 1960-08-19, 62 y.o.   MRN: [id number] Progress note postop day 1 status post right total knee arthroplasty  The patient is doing well the glucose is about 106.  She is on a insulin regimen  Her pain is controlled with Dilaudid and oral pain medication  She was able to ambulate yesterday with physical therapy  She is not holding any fluid in her ankles.  She had some congestive heart failure which was a concern prior to surgery.  It had been worked up and she was cleared by cardiology to proceed with surgery  She is a little groggy this morning to me but definitely mentating well  We expect discharge later today     Latest Ref Rng & Units 02/22/2023    4:38 AM 02/14/2023    9:02 AM 10/17/2022    1:21 PM  CBC  WBC 4.0 - 10.5 K/uL 15.1  12.6  10.8   Hemoglobin 12.0 - 15.0 g/dL 9.2  46.9  62.9   Hematocrit 36.0 - 46.0 % 31.2  37.1  34.5   Platelets 150 - 400 K/uL 227  306  293        Latest Ref Rng & Units 02/22/2023    4:38 AM 02/14/2023    9:02 AM 01/13/2023    8:48 AM  BMP  Glucose 70 - 99 mg/dL 528  69  413   BUN 6 - 20 mg/dL 12  11  7    Creatinine 0.44 - 1.00 mg/dL 2.44  0.10  2.72   BUN/Creat Ratio 12 - 28   8   Sodium 135 - 145 mmol/L 131  137  141   Potassium 3.5 - 5.1 mmol/L 3.6  4.0  3.9   Chloride 98 - 111 mmol/L 98  97  97   CO2 22 - 32 mmol/L 24  28  27    Calcium 8.9 - 10.3 mg/dL 8.2  9.1  9.1

## 2023-02-22 NOTE — Anesthesia Procedure Notes (Signed)
Anesthesia Regional Block: Adductor canal block   Pre-Anesthetic Checklist: , timeout performed,  Correct Patient, Correct Site, Correct Laterality,  Correct Procedure, Correct Position, site marked,  Risks and benefits discussed,  At surgeon's request and post-op pain management  Laterality: Right and Lower  Prep: chloraprep       Needles:  Injection technique: Single-shot  Needle Type: Echogenic Stimulator Needle     Needle Length: 10cm  Needle Gauge: 20   Needle insertion depth: 7 cm   Additional Needles:   Procedures:, nerve stimulator,,, ultrasound used (permanent image in chart),,     Nerve Stimulator or Paresthesia:  Response: Twitch elicited, 0.6 mA, 7 cm  Additional Responses:   Narrative:  Start time: 02/21/2023 7:07 AM End time: 02/21/2023 7:16 AM Injection made incrementally with aspirations every 5 mL.  Performed by: Personally  Anesthesiologist: Molli Barrows, MD  Additional Notes: BP cuff, EKG monitors applied. Sedation begun. After nerve location anesthetic injected incrementally, slowly , and after neg aspirations. Tolerated well.

## 2023-02-22 NOTE — Care Management Obs Status (Signed)
MEDICARE OBSERVATION STATUS NOTIFICATION   Patient Details  Name: [name] MRN: [id number] Date of Birth: 29-Apr-1961   Medicare Observation Status Notification Given:  Yes    Corey Harold 02/22/2023, 2:20 PM

## 2023-02-22 NOTE — Progress Notes (Signed)
Physical Therapy Treatment Patient Details Name: [name] MRN: [id number] DOB: 03-May-1961 Today's Date: 02/22/2023  RIGHT KNEE ROM: 0 - 110 degrees AMBULATION DISTANCE: 150 feet using RW with Modified Independence/Supervision   History of Present Illness [name] is a 62 y/o female, s/p Right TKA, with the diagnosis of right knee osteoarthritis.    PT Comments  Patient demonstrates good return for completing exercises, increased endurance/distance for gait training and able to go up/down steps in stairwell without loss of balance.  Patient tolerated sitting up in chair with RLE dangling after therapy with her spouse present.  Patient will benefit from continued skilled physical therapy in hospital and recommended venue below to increase strength, balance, endurance for safe ADLs and gait.      If plan is discharge home, recommend the following: A little help with walking and/or transfers;A little help with bathing/dressing/bathroom;Help with stairs or ramp for entrance;Assistance with cooking/housework   Can travel by private vehicle        Equipment Recommendations  None recommended by PT    Recommendations for Other Services       Precautions / Restrictions Precautions Precautions: Fall Restrictions Weight Bearing Restrictions: Yes RLE Weight Bearing: Weight bearing as tolerated     Mobility  Bed Mobility Overal bed mobility: Modified Independent Bed Mobility: Supine to Sit, Sit to Supine     Supine to sit: Modified independent (Device/Increase time) Sit to supine: Modified independent (Device/Increase time)   General bed mobility comments: good return for moving RLE when getting into/out of bed    Transfers Overall transfer level: Needs assistance Equipment used: Rolling walker (2 wheels) Transfers: Sit to/from Stand, Bed to chair/wheelchair/BSC Sit to Stand: Modified independent (Device/Increase time), Supervision   Step pivot transfers:  Modified independent (Device/Increase time), Supervision       General transfer comment: slightly labored movement, with increased time for completing sit to stands    Ambulation/Gait Ambulation/Gait assistance: Modified independent (Device/Increase time), Supervision Gait Distance (Feet): 150 Feet Assistive device: Rolling walker (2 wheels) Gait Pattern/deviations: Decreased step length - right, Decreased step length - left, Antalgic, Decreased stride length Gait velocity: decreased     General Gait Details: increased endurance/distance for gait training with fair/good carryover for right heel to toe stepping without loss of balance   Stairs Stairs: Yes Stairs assistance: Supervision Stair Management: Two rails, Step to pattern Number of Stairs: 4 General stair comments: demonstrates fair/good return for going up/down steps in stair using bilateral side rails without loss of balance   Wheelchair Mobility     Tilt Bed    Modified Rankin (Stroke Patients Only)       Balance Overall balance assessment: Needs assistance Sitting-balance support: Feet supported, No upper extremity supported Sitting balance-Leahy Scale: Good Sitting balance - Comments: seated at EOB   Standing balance support: During functional activity, Bilateral upper extremity supported Standing balance-Leahy Scale: Fair Standing balance comment: fair/good using RW                            Cognition Arousal: Alert Behavior During Therapy: WFL for tasks assessed/performed Overall Cognitive Status: Within Functional Limits for tasks assessed                                          Exercises Total Joint Exercises Ankle Circles/Pumps: Supine, 10 reps,  Right, Strengthening, AROM Quad Sets: AROM, Strengthening, Right, 10 reps, Supine Short Arc Quad: AROM, Strengthening, Right, 10 reps, Supine Heel Slides: AROM, Strengthening, Right, 10 reps, Supine Goniometric ROM:  Right knee: 0 - 110 degrees    General Comments        Pertinent Vitals/Pain Pain Assessment Pain Assessment: 0-10 Pain Score: 8  Pain Location: right knee Pain Descriptors / Indicators: Sore    Home Living                          Prior Function            PT Goals (current goals can now be found in the care plan section) Acute Rehab PT Goals Patient Stated Goal: return home with family to assist PT Goal Formulation: With patient Time For Goal Achievement: 02/23/23 Potential to Achieve Goals: Good Progress towards PT goals: Progressing toward goals    Frequency    BID      PT Plan      Co-evaluation              AM-PAC PT "6 Clicks" Mobility   Outcome Measure  Help needed turning from your back to your side while in a flat bed without using bedrails?: None Help needed moving from lying on your back to sitting on the side of a flat bed without using bedrails?: None Help needed moving to and from a bed to a chair (including a wheelchair)?: A Little Help needed standing up from a chair using your arms (e.g., wheelchair or bedside chair)?: None Help needed to walk in hospital room?: A Little Help needed climbing 3-5 steps with a railing? : A Little 6 Click Score: 21    End of Session   Activity Tolerance: Patient tolerated treatment well;Patient limited by fatigue Patient left: in chair;with call bell/phone within reach;with family/visitor present Nurse Communication: Mobility status PT Visit Diagnosis: Unsteadiness on feet (R26.81);Other abnormalities of gait and mobility (R26.89);Muscle weakness (generalized) (M62.81)     Time: 1308-6578 PT Time Calculation (min) (ACUTE ONLY): 25 min  Charges:    $Gait Training: 8-22 mins $Therapeutic Exercise: 8-22 mins PT General Charges $$ ACUTE PT VISIT: 1 Visit                     12:11 PM, 02/22/23 [name], MPT Physical Therapist with [facility] [phone]  office [phone] mobile phone

## 2023-02-24 ENCOUNTER — Ambulatory Visit: Payer: 59 | Admitting: Orthopedic Surgery

## 2023-02-24 ENCOUNTER — Ambulatory Visit (HOSPITAL_COMMUNITY)
Admission: RE | Admit: 2023-02-24 | Discharge: 2023-02-24 | Disposition: A | Discharge status: Home / Self Care | Payer: 59 | Source: Ambulatory Visit | Attending: Orthopedic Surgery | Admitting: Orthopedic Surgery

## 2023-02-24 DIAGNOSIS — Z96651 Presence of right artificial knee joint: Secondary | ICD-10-CM

## 2023-02-24 DIAGNOSIS — M1711 Unilateral primary osteoarthritis, right knee: Secondary | ICD-10-CM

## 2023-02-24 DIAGNOSIS — M79609 Pain in unspecified limb: Secondary | ICD-10-CM

## 2023-02-24 DIAGNOSIS — G8918 Other acute postprocedural pain: Secondary | ICD-10-CM

## 2023-02-24 MED ORDER — APIXABAN 2.5 MG PO TABS: 2.5000 mg | ORAL_TABLET | Freq: Two times a day (BID) | ORAL | 0 refills | Status: DC

## 2023-02-24 NOTE — Progress Notes (Signed)
Encounter Diagnoses  Name Primary?   Unilateral primary osteoarthritis, right knee    S/P total knee replacement, right Yes    Chief Complaint  Patient presents with   Post-op Problem    C/o incr pain and swelling     Unscheduled postop visit status post right total knee postop day 3 surgery date August 13  Comes in complaining of increased pain and swelling she is not tolerating the ice cuff or the stockings she is on aspirin for DVT prevention  The knee looks fine to me.  There is swelling of the legs on going to send her for a study to rule out DVT switch her to Eliquis  Let her use ice packs instead take the stockings off  Return on Monday

## 2023-02-24 NOTE — Patient Instructions (Signed)
Go today now for the ultrasound to make sure you dont have a blood clot [facility]

## 2023-02-27 ENCOUNTER — Ambulatory Visit (INDEPENDENT_AMBULATORY_CARE_PROVIDER_SITE_OTHER): Payer: 59 | Admitting: Orthopedic Surgery

## 2023-02-27 ENCOUNTER — Encounter: Payer: Self-pay | Admitting: Orthopedic Surgery

## 2023-02-27 DIAGNOSIS — Z96651 Presence of right artificial knee joint: Secondary | ICD-10-CM

## 2023-02-27 NOTE — Progress Notes (Signed)
Encounter Diagnosis  Name Primary?   Status post right knee replacement 02/21/23 Yes    Chief Complaint  Patient presents with   Post-op Follow-up    Right total knee replacement/ improving 02/21/23    Status post right total knee complicated by some swelling which has gotten a lot better.  She ruled out for DVT with an ultrasound.  She walked today and had a good get up and go test.  Her wound looks fine other than some stable erythema.  We will remove the staples on the 29th  Follow-up that date to remove the staples

## 2023-03-08 ENCOUNTER — Ambulatory Visit: Payer: 59 | Attending: Orthopedic Surgery | Admitting: Physical Therapy

## 2023-03-08 ENCOUNTER — Other Ambulatory Visit: Payer: Self-pay

## 2023-03-08 DIAGNOSIS — M1711 Unilateral primary osteoarthritis, right knee: Secondary | ICD-10-CM | POA: Diagnosis not present

## 2023-03-08 DIAGNOSIS — M25561 Pain in right knee: Secondary | ICD-10-CM | POA: Insufficient documentation

## 2023-03-08 DIAGNOSIS — R600 Localized edema: Secondary | ICD-10-CM | POA: Diagnosis present

## 2023-03-08 DIAGNOSIS — G8929 Other chronic pain: Secondary | ICD-10-CM | POA: Insufficient documentation

## 2023-03-08 DIAGNOSIS — M25661 Stiffness of right knee, not elsewhere classified: Secondary | ICD-10-CM | POA: Diagnosis present

## 2023-03-08 DIAGNOSIS — Z96651 Presence of right artificial knee joint: Secondary | ICD-10-CM | POA: Insufficient documentation

## 2023-03-08 NOTE — Therapy (Signed)
OUTPATIENT PHYSICAL THERAPY LOWER EXTREMITY EVALUATION   Patient Name: [name] MRN: [id number] DOB:1960/09/11, 62 y.o., female Today's Date: 03/08/2023  END OF SESSION:  PT End of Session - 03/08/23 1308     Visit Number 1    Number of Visits 12    Date for PT Re-Evaluation 06/06/23    Authorization Type FOTO.    PT Start Time 0930    PT Stop Time 1012    PT Time Calculation (min) 42 min    Activity Tolerance Patient tolerated treatment well    Behavior During Therapy WFL for tasks assessed/performed             Past Medical History:  Diagnosis Date   Anxiety    Arthritis    Asthma    Cancer (HCC)    cervical cancer; pt states a long long time ago   CHF (congestive heart failure) (HCC)    Coronary artery disease    Depression    Diabetes mellitus without complication (HCC)    Dyspnea    Edema of all four extremities    Essential (primary) hypertension 06/24/2020   Hyperlipemia    Type II diabetes mellitus (HCC) 03/22/2022   Past Surgical History:  Procedure Laterality Date   ABDOMINAL HYSTERECTOMY     ANTERIOR LAT LUMBAR FUSION N/A 11/05/2019   Procedure: Lumbar Four-Five Anterolateral lumbar interbody fusion with lateral plate;  Surgeon: Barnett Abu, MD;  Location: [facility] OR;  Service: Neurosurgery;  Laterality: N/A;  anterolateral   BACK SURGERY     pins   BACK SURGERY  07/11/2008   BIOPSY  06/29/2021   Procedure: BIOPSY;  Surgeon: Dolores Frame, MD;  Location: [facility];  Service: Gastroenterology;;   BLADDER REPAIR     CARPAL TUNNEL RELEASE Right    CESAREAN SECTION  1982, 1984   COLONOSCOPY N/A 06/12/2013   rehman: normal   CORONARY PRESSURE/FFR STUDY N/A 10/20/2022   Procedure: CORONARY PRESSURE/FFR STUDY;  Surgeon: [name], MD;  Location: [facility];  Service: Cardiovascular;  Laterality: N/A;   ESOPHAGOGASTRODUODENOSCOPY (EGD) WITH PROPOFOL N/A 06/29/2021   Procedure: ESOPHAGOGASTRODUODENOSCOPY (EGD) WITH  PROPOFOL;  Surgeon: Dolores Frame, MD;  Location: [facility];  Service: Gastroenterology;  Laterality: N/A;  8:40   KNEE ARTHROSCOPY WITH MEDIAL MENISECTOMY Right 05/09/2019   Procedure: KNEE ARTHROSCOPY WITH MEDIAL MENISECTOMY AND LATERAL MENISECTOMY;  Surgeon: Vickki Hearing, MD;  Location: [facility];  Service: Orthopedics;  Laterality: Right;   KNEE ARTHROSCOPY WITH MEDIAL MENISECTOMY Left 06/27/2019   Procedure: KNEE ARTHROSCOPY WITH MEDIAL MENISCECTOMY;  Surgeon: Vickki Hearing, MD;  Location: [facility];  Service: Orthopedics;  Laterality: Left;   LEFT HEART CATH AND CORONARY ANGIOGRAPHY N/A 10/20/2022   Procedure: LEFT HEART CATH AND CORONARY ANGIOGRAPHY;  Surgeon: [name], MD;  Location: [facility];  Service: Cardiovascular;  Laterality: N/A;   TOTAL KNEE ARTHROPLASTY Left 03/15/2022   Procedure: TOTAL KNEE ARTHROPLASTY;  Surgeon: Vickki Hearing, MD;  Location: [facility];  Service: Orthopedics;  Laterality: Left;   TOTAL KNEE ARTHROPLASTY Right 02/21/2023   Procedure: TOTAL KNEE ARTHROPLASTY;  Surgeon: Vickki Hearing, MD;  Location: [facility];  Service: Orthopedics;  Laterality: Right;   Patient Active Problem List   Diagnosis Date Noted   Primary osteoarthritis of right knee 02/21/2023   Lesion of vulva 10/06/2022   Lichen sclerosus et atrophicus 10/06/2022   S/P hysterectomy with oophorectomy 10/06/2022   Hot flashes 10/06/2022  Current use of estrogen therapy 10/06/2022   Anxiety 03/22/2022   Asthma 03/22/2022   Bipolar 1 disorder (HCC) 03/22/2022   Chronic pain 03/22/2022   Edema 03/22/2022   Gastro-esophageal reflux 03/22/2022   Hyperlipidemia 03/22/2022   Insomnia 03/22/2022   Menopausal symptom 03/22/2022   Sinusitis 03/22/2022   Type II diabetes mellitus (HCC) 03/22/2022   Urinary incontinence 03/22/2022   Osteoarthritis of left knee 03/15/2022   History of operative procedure on lumbosacral spinal structure 02/15/2022   Urge  incontinence 09/02/2021   Cervical radiculopathy 12/30/2020   Pseudoarthrosis of lumbar spine 08/25/2020   Essential (primary) hypertension 06/24/2020   Body mass index (BMI) 45.0-49.9, adult (HCC) 05/06/2020   Carpal tunnel syndrome 04/01/2020   Spondylolisthesis, lumbar region 11/22/2019   Localized swelling of both lower legs 11/12/2019   Lumbar stenosis 11/05/2019   S/P left knee arthroscopy 06/27/2019 07/01/2019   Derangement of posterior horn of medial meniscus of left knee    Primary osteoarthritis of left knee    S/P right knee arthroscopy 05/09/19 05/29/2019   Depression 07/24/2014   LOWER LEG, ARTHRITIS, DEGEN./OSTEO 02/18/2009   DERANGEMENT MENISCUS 02/18/2009   ANSERINE BURSITIS, RIGHT 02/18/2009    REFERRING PROVIDER: Fuller Canada MD  REFERRING DIAG: Right total knee replacement  THERAPY DIAG:  Chronic pain of right knee  Stiffness of right knee, not elsewhere classified  Localized edema  Rationale for Evaluation and Treatment: Rehabilitation  ONSET DATE: 02/21/23 (surgery date).  SUBJECTIVE:   SUBJECTIVE STATEMENT: The patient presents to the clinic s/p right total knee replacement performed on 02/21/23.  She rates her pain at a 6/10 today and pain increases with movement.  Ice, elevation and pain medication helps decrease pain.  She is currently using a FWW for safe ambualtion.  She states she has been compliant to her HEP.  PERTINENT HISTORY: Left TKA, CTS, DM. PAIN:  Are you having pain? Yes: NPRS scale: 6/10 Pain location: Right knee. Pain description: Throbbing. Aggravating factors: As above. Relieving factors: As above.  PRECAUTIONS: Other: No ultrasound.   WEIGHT BEARING RESTRICTIONS: No  FALLS:  Has patient fallen in last 6 months? No  LIVING ENVIRONMENT: Lives with: lives with their family Lives in: House/apartment Stairs: 3 external steps.  Non-reciprocating. Has following equipment at home: Dan Humphreys - 2 wheeled  OCCUPATION:  Disabled.  PLOF: Independent with basic ADLs  PATIENT GOALS: Not have knee pain and  move better.   OBJECTIVE:   PATIENT SURVEYS:  FOTO .  EDEMA:  Circumferential: Right 4 cms > left.  PALPATION: C/o diffuse anterior right knee pain.  Post-op bandage is intact.  Visible ecchymosis.  ROM:  -15 degrees of right knee extension in supine and -10 degrees passive with flexion to 95 degrees.  LOWER EXTREMITY MMT:  Patient able to perform an antigravity right SLR and SAQ.   GAIT: The patient is walking safely with a FWW with decreases step and streide length.   TODAY'S TREATMENT:  DATE: Nustep level 1 x 5 minutes f/b  LE elevation and vasopneumatic to right knee x 15 minutes.    PATIENT EDUCATION:  Education details: Discussed HEP. Person educated: Patient Education method: Explanation Education comprehension: verbalized understanding  HOME EXERCISE PROGRAM:   ASSESSMENT:  CLINICAL IMPRESSION: The patient presents to OPPT s/p right total knee replacement performed on 02/21/23.  She is pleased with her progress thus far.  She is lacking some range of motion and has moderate edema currently.  She is able to perform an antigravity right SLR and SAQ.  She is walking safely with a FWW with a decrease in step and stride length. Patient will benefit from skilled physical therapy intervention to address pain and deficits.  OBJECTIVE IMPAIRMENTS: Abnormal gait, decreased activity tolerance, decreased ROM, decreased strength, increased edema, and pain.   ACTIVITY LIMITATIONS: carrying, lifting, standing, bathing, and locomotion level  PARTICIPATION LIMITATIONS: meal prep, cleaning, and laundry  PERSONAL FACTORS: Time since onset of injury/illness/exacerbation are also affecting patient's functional outcome.   REHAB POTENTIAL: Excellent  CLINICAL DECISION  MAKING: Stable/uncomplicated  EVALUATION COMPLEXITY: Low   GOALS:  SHORT TERM GOALS: Target date: 03/22/23.  Ind with an initial HEP. Goal status: INITIAL  2.  Full active right knee extension.  Goal status: INITIAL   LONG TERM GOALS: Target date: 06/06/23  Ind with an advanced HEP.  Goal status: INITIAL  2.  Active right knee flexion to 115 degrees+ so the patient can perform functional tasks and do so with pain not > 2-3/10.  Goal status: INITIAL  3.  Increase right hip and knee strength to a solid 4+ to 5/5 to provide good stability for accomplishment of functional activities.  Goal status: INITIAL  4.  Perform a reciprocating stair gait with one railing with pain not > 2-3/10.  Goal status: INITIAL  5.  Patient walk safely with a straight cane.  Goal status: INITIAL  PLAN:  PT FREQUENCY: 2x/week  PT DURATION: 6 weeks  PLANNED INTERVENTIONS: Therapeutic exercises, Therapeutic activity, Neuromuscular re-education, Gait training, Patient/Family education, Self Care, Cryotherapy, Moist heat, Vasopneumatic device, and Manual therapy  PLAN FOR NEXT SESSION: Progress per TKA protocol.  LE elevation and vasopneumatic.    Simcha Speir, [place], PT 03/08/2023, 2:48 PM

## 2023-03-09 ENCOUNTER — Ambulatory Visit (INDEPENDENT_AMBULATORY_CARE_PROVIDER_SITE_OTHER): Payer: 59 | Admitting: Orthopedic Surgery

## 2023-03-09 ENCOUNTER — Encounter: Payer: Self-pay | Admitting: Orthopedic Surgery

## 2023-03-09 DIAGNOSIS — Z96651 Presence of right artificial knee joint: Secondary | ICD-10-CM

## 2023-03-09 DIAGNOSIS — M25561 Pain in right knee: Secondary | ICD-10-CM

## 2023-03-09 DIAGNOSIS — G8918 Other acute postprocedural pain: Secondary | ICD-10-CM

## 2023-03-09 MED ORDER — CELECOXIB 100 MG PO CAPS
100.0000 mg | ORAL_CAPSULE | Freq: Two times a day (BID) | ORAL | 2 refills | Status: DC
Start: 2023-03-09 — End: 2023-06-27

## 2023-03-09 MED ORDER — METHOCARBAMOL 500 MG PO TABS
500.0000 mg | ORAL_TABLET | Freq: Four times a day (QID) | ORAL | 2 refills | Status: DC | PRN
Start: 2023-03-09 — End: 2023-07-17

## 2023-03-09 NOTE — Progress Notes (Signed)
VISIT TYPE: POST OP VISIT   Chief Complaint  Patient presents with   Post-op Follow-up    Right knee replacement 02/21/23 improving asking about anti inflammatory but is taking Eliquis Also needs a refill of the Robaxin     Encounter Diagnosis  Name Primary?   S/P total knee replacement, right 02/21/23 Yes    PROCEDURE: Right total knee arthroplasty  DATE OF SURGERY: February 21, 2019:  POV # 3   Current Outpatient Medications:    albuterol (VENTOLIN HFA) 108 (90 Base) MCG/ACT inhaler, Inhale 1-2 puffs into the lungs every 6 (six) hours as needed for wheezing or shortness of breath., Disp: , Rfl:    apixaban (ELIQUIS) 2.5 MG TABS tablet, Take 1 tablet (2.5 mg total) by mouth 2 (two) times daily., Disp: 60 tablet, Rfl: 0   ARIPiprazole (ABILIFY) 2 MG tablet, Take 2 mg by mouth daily., Disp: , Rfl:    aspirin 81 MG chewable tablet, Chew 1 tablet (81 mg total) by mouth 2 (two) times daily., Disp: 90 tablet, Rfl: 0   atorvastatin (LIPITOR) 80 MG tablet, Take 1 tablet (80 mg total) by mouth daily. (Patient taking differently: Take 80 mg by mouth at bedtime.), Disp: 90 tablet, Rfl: 3   bisacodyl (DULCOLAX) 5 MG EC tablet, Take 1 tablet (5 mg total) by mouth daily as needed for moderate constipation., Disp: 30 tablet, Rfl: 0   Empagliflozin-metFORMIN HCl ER (SYNJARDY XR) 12.11-998 MG TB24, Take 1 tablet by mouth daily., Disp: , Rfl:    estradiol (ESTRACE) 0.1 MG/GM vaginal cream, Discard plastic applicator. Insert a blueberry size amount (approximately 1 gram) of cream on fingertip inside vagina at bedtime every night for 1 week then 2 nights per week for long term use., Disp: 30 g, Rfl: 3   estradiol (ESTRACE) 2 MG tablet, TAKE ONE TABLET AT BEDTIME, Disp: 30 tablet, Rfl: 11   FLUoxetine (PROZAC) 40 MG capsule, Take 40 mg by mouth daily., Disp: , Rfl:    gabapentin (NEURONTIN) 300 MG capsule, Take 1 capsule (300 mg total) by mouth 3 (three) times daily. (Patient taking differently: Take  300 mg by mouth See admin instructions. Take 300 mg in the morning and 600 mg at night), Disp: 21 capsule, Rfl: 3   loratadine (CLARITIN) 10 MG tablet, Take 10 mg by mouth daily., Disp: , Rfl:    metFORMIN (GLUCOPHAGE) 500 MG tablet, Take 500 mg by mouth daily with breakfast., Disp: , Rfl:    methocarbamol (ROBAXIN) 500 MG tablet, Take 1 tablet (500 mg total) by mouth every 6 (six) hours as needed for muscle spasms., Disp: 56 tablet, Rfl: 2   metoprolol tartrate (LOPRESSOR) 25 MG tablet, Take 1 tablet (25 mg total) by mouth 2 (two) times daily., Disp: 180 tablet, Rfl: 3   mirabegron ER (MYRBETRIQ) 25 MG TB24 tablet, Take 2 tablets (50 mg total) by mouth daily., Disp: 60 tablet, Rfl: 11   omeprazole (PRILOSEC) 40 MG capsule, Take 1 capsule (40 mg total) by mouth daily., Disp: 90 capsule, Rfl: 3   oxymetazoline (AFRIN) 0.05 % nasal spray, Place 1 spray into both nostrils 2 (two) times daily as needed for congestion., Disp: , Rfl:    torsemide (DEMADEX) 20 MG tablet, Take 2 tablets (40 mg total) by mouth daily., Disp: 180 tablet, Rfl: 3   traZODone (DESYREL) 100 MG tablet, Take 200 mg by mouth at bedtime., Disp: , Rfl:    nitroGLYCERIN (NITROSTAT) 0.4 MG SL tablet, Place 1 tablet (0.4 mg total)  under the tongue every 5 (five) minutes as needed for chest pain., Disp: 25 tablet, Rfl: 3   potassium chloride SA (KLOR-CON M20) 20 MEQ tablet, Take 1 tablet (20 mEq total) by mouth daily., Disp: 90 tablet, Rfl: 3   spironolactone (ALDACTONE) 25 MG tablet, Take 1 tablet (25 mg total) by mouth daily., Disp: 90 tablet, Rfl: 3  Current Facility-Administered Medications:    bupivacaine-meloxicam ER (ZYNRELEF) injection 400 mg, 400 mg, Infiltration, Once, Vickki Hearing, MD   bupivacaine-meloxicam ER (ZYNRELEF) injection 400 mg, 400 mg, Infiltration, Once, Vickki Hearing, MD   Subjective  Her main complaint right now is right leg swelling.  She would like her Robaxin refilled.  Asking about  anti-inflammatories.  ASSESSMENT AND PLAN   IMPRESSION: No right lower extremity DVT.     Electronically Signed   By: Acquanetta Belling M.D.   On: 02/24/2023 11:56   Staple extraction on postop day 15 today wound is clean dry and intact Steri-Strips and dressing applied patient to take shower Monday  I did start her on some Celebrex 100 mg twice a day  I will hold off on starting her fluid pill because it looks like it is unilateral   Meds ordered this encounter  Medications   methocarbamol (ROBAXIN) 500 MG tablet    Sig: Take 1 tablet (500 mg total) by mouth every 6 (six) hours as needed for muscle spasms.    Dispense:  56 tablet    Refill:  2   celecoxib (CELEBREX) 100 MG capsule    Sig: Take 1 capsule (100 mg total) by mouth 2 (two) times daily.    Dispense:  60 capsule    Refill:  2

## 2023-03-14 ENCOUNTER — Ambulatory Visit: Payer: 59 | Attending: Orthopedic Surgery

## 2023-03-14 DIAGNOSIS — M25661 Stiffness of right knee, not elsewhere classified: Secondary | ICD-10-CM | POA: Insufficient documentation

## 2023-03-14 DIAGNOSIS — G8929 Other chronic pain: Secondary | ICD-10-CM | POA: Diagnosis present

## 2023-03-14 DIAGNOSIS — R600 Localized edema: Secondary | ICD-10-CM | POA: Insufficient documentation

## 2023-03-14 DIAGNOSIS — M25561 Pain in right knee: Secondary | ICD-10-CM | POA: Diagnosis present

## 2023-03-14 NOTE — Therapy (Signed)
OUTPATIENT PHYSICAL THERAPY LOWER EXTREMITY EVALUATION   Patient Name: [name] MRN: [id number] DOB:04-09-1961, 62 y.o., female Today's Date: 03/14/2023  END OF SESSION:  PT End of Session - 03/14/23 0819     Visit Number 2    Number of Visits 12    Date for PT Re-Evaluation 06/06/23    Authorization Type FOTO.    PT Start Time 0815    PT Stop Time 0903    PT Time Calculation (min) 48 min    Activity Tolerance Patient tolerated treatment well    Behavior During Therapy WFL for tasks assessed/performed             Past Medical History:  Diagnosis Date   Anxiety    Arthritis    Asthma    Cancer (HCC)    cervical cancer; pt states a long long time ago   CHF (congestive heart failure) (HCC)    Coronary artery disease    Depression    Diabetes mellitus without complication (HCC)    Dyspnea    Edema of all four extremities    Essential (primary) hypertension 06/24/2020   Hyperlipemia    Type II diabetes mellitus (HCC) 03/22/2022   Past Surgical History:  Procedure Laterality Date   ABDOMINAL HYSTERECTOMY     ANTERIOR LAT LUMBAR FUSION N/A 11/05/2019   Procedure: Lumbar Four-Five Anterolateral lumbar interbody fusion with lateral plate;  Surgeon: Barnett Abu, MD;  Location: [facility];  Service: Neurosurgery;  Laterality: N/A;  anterolateral   BACK SURGERY     pins   BACK SURGERY  07/11/2008   BIOPSY  06/29/2021   Procedure: BIOPSY;  Surgeon: Dolores Frame, MD;  Location: [facility];  Service: Gastroenterology;;   BLADDER REPAIR     CARPAL TUNNEL RELEASE Right    CESAREAN SECTION  1982, 1984   COLONOSCOPY N/A 06/12/2013   rehman: normal   CORONARY PRESSURE/FFR STUDY N/A 10/20/2022   Procedure: CORONARY PRESSURE/FFR STUDY;  Surgeon: [name], MD;  Location: [facility];  Service: Cardiovascular;  Laterality: N/A;   ESOPHAGOGASTRODUODENOSCOPY (EGD) WITH PROPOFOL N/A 06/29/2021   Procedure: ESOPHAGOGASTRODUODENOSCOPY (EGD) WITH  PROPOFOL;  Surgeon: Dolores Frame, MD;  Location: [facility];  Service: Gastroenterology;  Laterality: N/A;  8:40   KNEE ARTHROSCOPY WITH MEDIAL MENISECTOMY Right 05/09/2019   Procedure: KNEE ARTHROSCOPY WITH MEDIAL MENISECTOMY AND LATERAL MENISECTOMY;  Surgeon: Vickki Hearing, MD;  Location: [facility];  Service: Orthopedics;  Laterality: Right;   KNEE ARTHROSCOPY WITH MEDIAL MENISECTOMY Left 06/27/2019   Procedure: KNEE ARTHROSCOPY WITH MEDIAL MENISCECTOMY;  Surgeon: Vickki Hearing, MD;  Location: [facility];  Service: Orthopedics;  Laterality: Left;   LEFT HEART CATH AND CORONARY ANGIOGRAPHY N/A 10/20/2022   Procedure: LEFT HEART CATH AND CORONARY ANGIOGRAPHY;  Surgeon: [name], MD;  [facility]: [facility];  Service: Cardiovascular;  Laterality: N/A;   TOTAL KNEE ARTHROPLASTY Left 03/15/2022   Procedure: TOTAL KNEE ARTHROPLASTY;  Surgeon: Vickki Hearing, MD;  Location: [facility];  Service: Orthopedics;  Laterality: Left;   TOTAL KNEE ARTHROPLASTY Right 02/21/2023   Procedure: TOTAL KNEE ARTHROPLASTY;  Surgeon: Vickki Hearing, MD;  Location: [facility];  Service: Orthopedics;  Laterality: Right;   Patient Active Problem List   Diagnosis Date Noted   S/P total knee replacement, right 02/21/23 03/08/2023   Primary osteoarthritis of right knee 02/21/2023   Lesion of vulva 10/06/2022   Lichen sclerosus et atrophicus 10/06/2022   S/P hysterectomy  with oophorectomy 10/06/2022   Hot flashes 10/06/2022   Current use of estrogen therapy 10/06/2022   Anxiety 03/22/2022   Asthma 03/22/2022   Bipolar 1 disorder (HCC) 03/22/2022   Chronic pain 03/22/2022   Edema 03/22/2022   Gastro-esophageal reflux 03/22/2022   Hyperlipidemia 03/22/2022   Insomnia 03/22/2022   Menopausal symptom 03/22/2022   Sinusitis 03/22/2022   Type II diabetes mellitus (HCC) 03/22/2022   Urinary incontinence 03/22/2022   Osteoarthritis of left knee 03/15/2022   History of operative procedure  on lumbosacral spinal structure 02/15/2022   Urge incontinence 09/02/2021   Cervical radiculopathy 12/30/2020   Pseudoarthrosis of lumbar spine 08/25/2020   Essential (primary) hypertension 06/24/2020   Body mass index (BMI) 45.0-49.9, adult (HCC) 05/06/2020   Carpal tunnel syndrome 04/01/2020   Spondylolisthesis, lumbar region 11/22/2019   Localized swelling of both lower legs 11/12/2019   Lumbar stenosis 11/05/2019   S/P left knee arthroscopy 06/27/2019 07/01/2019   Derangement of posterior horn of medial meniscus of left knee    Primary osteoarthritis of left knee    S/P right knee arthroscopy 05/09/19 05/29/2019   Depression 07/24/2014   LOWER LEG, ARTHRITIS, DEGEN./OSTEO 02/18/2009   DERANGEMENT MENISCUS 02/18/2009   ANSERINE BURSITIS, RIGHT 02/18/2009    REFERRING PROVIDER: Fuller Canada MD  REFERRING DIAG: Right total knee replacement  THERAPY DIAG:  Chronic pain of right knee  Stiffness of right knee, not elsewhere classified  Localized edema  Rationale for Evaluation and Treatment: Rehabilitation  ONSET DATE: 02/21/23 (surgery date).  SUBJECTIVE:   SUBJECTIVE STATEMENT: Pt reports 7/10 right knee pain today.   PERTINENT HISTORY: Left TKA, CTS, DM. PAIN:  Are you having pain? Yes: NPRS scale: 7/10 Pain location: Right knee. Pain description: Throbbing. Aggravating factors: As above. Relieving factors: As above.  PRECAUTIONS: Other: No ultrasound.   WEIGHT BEARING RESTRICTIONS: No  FALLS:  Has patient fallen in last 6 months? No  LIVING ENVIRONMENT: Lives with: lives with their family Lives in: House/apartment Stairs: 3 external steps.  Non-reciprocating. Has following equipment at home: Dan Humphreys - 2 wheeled  OCCUPATION: Disabled.  PLOF: Independent with basic ADLs  PATIENT GOALS: Not have knee pain and  move better.   OBJECTIVE:   PATIENT SURVEYS:  FOTO .  EDEMA:  Circumferential: Right 4 cms > left.  PALPATION: C/o diffuse  anterior right knee pain.  Post-op bandage is intact.  Visible ecchymosis.  ROM:  -15 degrees of right knee extension in supine and -10 degrees passive with flexion to 95 degrees.  LOWER EXTREMITY MMT:  Patient able to perform an antigravity right SLR and SAQ.   GAIT: The patient is walking safely with a FWW with decreases step and streide length.   TODAY'S TREATMENT:                                                                                                                              DATE:  EXERCISE LOG  Exercise Repetitions and Resistance Comments  Nustep Lvl 3 x 15 mins seat 9-7   LAQ 20 reps   Seated Marches 20 reps   SAQ 20 reps   Supine Ball Squeeze 20 reps   Supine Clams Red 20 reps   SLR 20 reps   Heel Slides Heel Glide x 20 reps    Blank cell = exercise not performed today   Modalities  Date:  Vaso: Knee, 34 degrees; low pressure, 15 mins, Pain and Edema  PATIENT EDUCATION:  Education details: Discussed HEP. Person educated: Patient Education method: Explanation Education comprehension: verbalized understanding  HOME EXERCISE PROGRAM:   ASSESSMENT:  CLINICAL IMPRESSION: Pt arrives for today's treatment session 15 mins late reporting 7/10 right knee pain.  Pt able to progress forward two seats on Nustep to seat 6 today with minimal discomfort.  Pt instructed in seated and supine RLE exercises to increase strength, function, and ROM.  Normal responses to vaso noted upon removal.  Pt reported decreased pain at completion of today's treatment session.  OBJECTIVE IMPAIRMENTS: Abnormal gait, decreased activity tolerance, decreased ROM, decreased strength, increased edema, and pain.   ACTIVITY LIMITATIONS: carrying, lifting, standing, bathing, and locomotion level  PARTICIPATION LIMITATIONS: meal prep, cleaning, and laundry  PERSONAL FACTORS: Time since onset of injury/illness/exacerbation are also affecting patient's  functional outcome.   REHAB POTENTIAL: Excellent  CLINICAL DECISION MAKING: Stable/uncomplicated  EVALUATION COMPLEXITY: Low   GOALS:  SHORT TERM GOALS: Target date: 03/22/23.  Ind with an initial HEP. Goal status: INITIAL  2.  Full active right knee extension.  Goal status: INITIAL   LONG TERM GOALS: Target date: 06/06/23  Ind with an advanced HEP.  Goal status: INITIAL  2.  Active right knee flexion to 115 degrees+ so the patient can perform functional tasks and do so with pain not > 2-3/10.  Goal status: INITIAL  3.  Increase right hip and knee strength to a solid 4+ to 5/5 to provide good stability for accomplishment of functional activities.  Goal status: INITIAL  4.  Perform a reciprocating stair gait with one railing with pain not > 2-3/10.  Goal status: INITIAL  5.  Patient walk safely with a straight cane.  Goal status: INITIAL  PLAN:  PT FREQUENCY: 2x/week  PT DURATION: 6 weeks  PLANNED INTERVENTIONS: Therapeutic exercises, Therapeutic activity, Neuromuscular re-education, Gait training, Patient/Family education, Self Care, Cryotherapy, Moist heat, Vasopneumatic device, and Manual therapy  PLAN FOR NEXT SESSION: Progress per TKA protocol.  LE elevation and vasopneumatic.   Newman Pies, PTA 03/14/2023, 9:34 AM

## 2023-03-17 ENCOUNTER — Ambulatory Visit: Payer: 59 | Admitting: Physical Therapy

## 2023-03-17 DIAGNOSIS — R600 Localized edema: Secondary | ICD-10-CM

## 2023-03-17 DIAGNOSIS — M25661 Stiffness of right knee, not elsewhere classified: Secondary | ICD-10-CM

## 2023-03-17 DIAGNOSIS — M25561 Pain in right knee: Secondary | ICD-10-CM | POA: Diagnosis not present

## 2023-03-17 DIAGNOSIS — G8929 Other chronic pain: Secondary | ICD-10-CM

## 2023-03-17 NOTE — Therapy (Signed)
OUTPATIENT PHYSICAL THERAPY LOWER EXTREMITY EVALUATION   Patient Name: [name] MRN: [id number] DOB:February 27, 1961, 62 y.o., female Today's Date: 03/17/2023  END OF SESSION:  PT End of Session - 03/17/23 0859     Visit Number 3    Number of Visits 12    Date for PT Re-Evaluation 06/06/23    Authorization Type FOTO.    PT Start Time 0800    PT Stop Time 0849    PT Time Calculation (min) 49 min    Activity Tolerance Patient tolerated treatment well    Behavior During Therapy WFL for tasks assessed/performed             Past Medical History:  Diagnosis Date   Anxiety    Arthritis    Asthma    Cancer (HCC)    cervical cancer; pt states a long long time ago   CHF (congestive heart failure) (HCC)    Coronary artery disease    Depression    Diabetes mellitus without complication (HCC)    Dyspnea    Edema of all four extremities    Essential (primary) hypertension 06/24/2020   Hyperlipemia    Type II diabetes mellitus (HCC) 03/22/2022   Past Surgical History:  Procedure Laterality Date   ABDOMINAL HYSTERECTOMY     ANTERIOR LAT LUMBAR FUSION N/A 11/05/2019   Procedure: Lumbar Four-Five Anterolateral lumbar interbody fusion with lateral plate;  Surgeon: Barnett Abu, MD;  Location: [facility];  Service: Neurosurgery;  Laterality: N/A;  anterolateral   BACK SURGERY     pins   BACK SURGERY  07/11/2008   BIOPSY  06/29/2021   Procedure: BIOPSY;  Surgeon: Dolores Frame, MD;  [facility]: [facility];  Service: Gastroenterology;;   BLADDER REPAIR     CARPAL TUNNEL RELEASE Right    CESAREAN SECTION  1982, 1984   COLONOSCOPY N/A 06/12/2013   rehman: normal   CORONARY PRESSURE/FFR STUDY N/A 10/20/2022   Procedure: CORONARY PRESSURE/FFR STUDY;  Surgeon: [name], MD;  [facility];  Service: Cardiovascular;  Laterality: N/A;   ESOPHAGOGASTRODUODENOSCOPY (EGD) WITH PROPOFOL N/A 06/29/2021   Procedure: ESOPHAGOGASTRODUODENOSCOPY (EGD) WITH  PROPOFOL;  Surgeon: Dolores Frame, MD;  [facility];  Service: Gastroenterology;  Laterality: N/A;  8:40   KNEE ARTHROSCOPY WITH MEDIAL MENISECTOMY Right 05/09/2019   Procedure: KNEE ARTHROSCOPY WITH MEDIAL MENISECTOMY AND LATERAL MENISECTOMY;  Surgeon: Vickki Hearing, MD;  Location: [facility];  Service: Orthopedics;  Laterality: Right;   KNEE ARTHROSCOPY WITH MEDIAL MENISECTOMY Left 06/27/2019   Procedure: KNEE ARTHROSCOPY WITH MEDIAL MENISCECTOMY;  Surgeon: Vickki Hearing, MD;  Location: [facility];  Service: Orthopedics;  Laterality: Left;   LEFT HEART CATH AND CORONARY ANGIOGRAPHY N/A 10/20/2022   Procedure: LEFT HEART CATH AND CORONARY ANGIOGRAPHY;  Surgeon: [name], MD;  [facility]: [facility];  Service: Cardiovascular;  Laterality: N/A;   TOTAL KNEE ARTHROPLASTY Left 03/15/2022   Procedure: TOTAL KNEE ARTHROPLASTY;  Surgeon: Vickki Hearing, MD;  Location: [facility];  Service: Orthopedics;  Laterality: Left;   TOTAL KNEE ARTHROPLASTY Right 02/21/2023   Procedure: TOTAL KNEE ARTHROPLASTY;  Surgeon: Vickki Hearing, MD;  Location: [facility];  Service: Orthopedics;  Laterality: Right;   Patient Active Problem List   Diagnosis Date Noted   S/P total knee replacement, right 02/21/23 03/08/2023   Primary osteoarthritis of right knee 02/21/2023   Lesion of vulva 10/06/2022   Lichen sclerosus et atrophicus 10/06/2022   S/P hysterectomy  with oophorectomy 10/06/2022   Hot flashes 10/06/2022   Current use of estrogen therapy 10/06/2022   Anxiety 03/22/2022   Asthma 03/22/2022   Bipolar 1 disorder (HCC) 03/22/2022   Chronic pain 03/22/2022   Edema 03/22/2022   Gastro-esophageal reflux 03/22/2022   Hyperlipidemia 03/22/2022   Insomnia 03/22/2022   Menopausal symptom 03/22/2022   Sinusitis 03/22/2022   Type II diabetes mellitus (HCC) 03/22/2022   Urinary incontinence 03/22/2022   Osteoarthritis of left knee 03/15/2022   History of operative procedure  on lumbosacral spinal structure 02/15/2022   Urge incontinence 09/02/2021   Cervical radiculopathy 12/30/2020   Pseudoarthrosis of lumbar spine 08/25/2020   Essential (primary) hypertension 06/24/2020   Body mass index (BMI) 45.0-49.9, adult (HCC) 05/06/2020   Carpal tunnel syndrome 04/01/2020   Spondylolisthesis, lumbar region 11/22/2019   Localized swelling of both lower legs 11/12/2019   Lumbar stenosis 11/05/2019   S/P left knee arthroscopy 06/27/2019 07/01/2019   Derangement of posterior horn of medial meniscus of left knee    Primary osteoarthritis of left knee    S/P right knee arthroscopy 05/09/19 05/29/2019   Depression 07/24/2014   LOWER LEG, ARTHRITIS, DEGEN./OSTEO 02/18/2009   DERANGEMENT MENISCUS 02/18/2009   ANSERINE BURSITIS, RIGHT 02/18/2009    REFERRING PROVIDER: Fuller Canada MD  REFERRING DIAG: Right total knee replacement  THERAPY DIAG:  Chronic pain of right knee  Stiffness of right knee, not elsewhere classified  Localized edema  Rationale for Evaluation and Treatment: Rehabilitation  ONSET DATE: 02/21/23 (surgery date).  SUBJECTIVE:   SUBJECTIVE STATEMENT: Pain around a 5.  PERTINENT HISTORY: Left TKA, CTS, DM. PAIN:  Are you having pain? Yes: NPRS scale: 5/10 Pain location: Right knee. Pain description: Throbbing. Aggravating factors: As above. Relieving factors: As above.  PRECAUTIONS: Other: No ultrasound.   WEIGHT BEARING RESTRICTIONS: No  FALLS:  Has patient fallen in last 6 months? No  LIVING ENVIRONMENT: Lives with: lives with their family Lives in: House/apartment Stairs: 3 external steps.  Non-reciprocating. Has following equipment at home: Dan Humphreys - 2 wheeled  OCCUPATION: Disabled.  PLOF: Independent with basic ADLs  PATIENT GOALS: Not have knee pain and  move better.   OBJECTIVE:   PATIENT SURVEYS:  FOTO .  EDEMA:  Circumferential: Right 4 cms > left.  PALPATION: C/o diffuse anterior right knee pain.   Post-op bandage is intact.  Visible ecchymosis.  ROM:  -15 degrees of right knee extension in supine and -10 degrees passive with flexion to 95 degrees.  LOWER EXTREMITY MMT:  Patient able to perform an antigravity right SLR and SAQ.   GAIT: The patient is walking safely with a FWW with decreases step and streide length.   TODAY'S TREATMENT:                                                                                                                              DATE: 03/17/23:  EXERCISE LOG  Exercise Repetitions and Resistance Comments  Nustep Level 3 x 16 minutes moving seat forward x 2 to increase flexion.   Rockerboard In parallel bars x 3 minutes.   SAQ's 5# x 3 minutes.           In supine:  PROM x 6 minutes into right knee flexion and extension (low load long duration stretching technique) f/b  LE elevation and vasopneumatic x 15 minutes.    LOWER EXTREMITY ROM:     Active/Passive  Right Eval(03/08/23) Right(03/17/23)  Knee flexion 95 115/120  Knee extension -15(A)/ -10(P)        PATIENT EDUCATION:  Education details: Discussed HEP. Person educated: Patient Education method: Explanation Education comprehension: verbalized understanding  HOME EXERCISE PROGRAM:   ASSESSMENT:  CLINICAL IMPRESSION: The patient is making very good progress and achieved active right knee flexion post-stretching to 115 degrees and passive to 120 degrees.  OBJECTIVE IMPAIRMENTS: Abnormal gait, decreased activity tolerance, decreased ROM, decreased strength, increased edema, and pain.   ACTIVITY LIMITATIONS: carrying, lifting, standing, bathing, and locomotion level  PARTICIPATION LIMITATIONS: meal prep, cleaning, and laundry  PERSONAL FACTORS: Time since onset of injury/illness/exacerbation are also affecting patient's functional outcome.   REHAB POTENTIAL: Excellent  CLINICAL DECISION MAKING: Stable/uncomplicated  EVALUATION  COMPLEXITY: Low   GOALS:  SHORT TERM GOALS: Target date: 03/22/23.  Ind with an initial HEP. Goal status: INITIAL  2.  Full active right knee extension.  Goal status: INITIAL   LONG TERM GOALS: Target date: 06/06/23  Ind with an advanced HEP.  Goal status: INITIAL  2.  Active right knee flexion to 115 degrees+ so the patient can perform functional tasks and do so with pain not > 2-3/10.  Goal status: INITIAL  3.  Increase right hip and knee strength to a solid 4+ to 5/5 to provide good stability for accomplishment of functional activities.  Goal status: INITIAL  4.  Perform a reciprocating stair gait with one railing with pain not > 2-3/10.  Goal status: INITIAL  5.  Patient walk safely with a straight cane.  Goal status: INITIAL  PLAN:  PT FREQUENCY: 2x/week  PT DURATION: 6 weeks  PLANNED INTERVENTIONS: Therapeutic exercises, Therapeutic activity, Neuromuscular re-education, Gait training, Patient/Family education, Self Care, Cryotherapy, Moist heat, Vasopneumatic device, and Manual therapy  PLAN FOR NEXT SESSION: Progress per TKA protocol.  LE elevation and vasopneumatic.   Peterson Mathey, [place], PT 03/17/2023, 9:18 AM

## 2023-03-20 ENCOUNTER — Encounter (HOSPITAL_COMMUNITY): Payer: Self-pay

## 2023-03-20 ENCOUNTER — Ambulatory Visit: Payer: 59 | Admitting: Physical Therapy

## 2023-03-20 ENCOUNTER — Encounter (HOSPITAL_COMMUNITY)
Admission: RE | Admit: 2023-03-20 | Discharge: 2023-03-20 | Disposition: A | Discharge status: Home / Self Care | Payer: 59 | Source: Ambulatory Visit | Attending: Urology | Admitting: Urology

## 2023-03-20 DIAGNOSIS — G8929 Other chronic pain: Secondary | ICD-10-CM

## 2023-03-20 DIAGNOSIS — M25561 Pain in right knee: Secondary | ICD-10-CM | POA: Diagnosis not present

## 2023-03-20 DIAGNOSIS — R600 Localized edema: Secondary | ICD-10-CM

## 2023-03-20 DIAGNOSIS — M25661 Stiffness of right knee, not elsewhere classified: Secondary | ICD-10-CM

## 2023-03-20 NOTE — Therapy (Signed)
OUTPATIENT PHYSICAL THERAPY LOWER EXTREMITY EVALUATION   Patient Name: [name] MRN: [id number] DOB:1961/06/15, 62 y.o., female Today's Date: 03/20/2023  END OF SESSION:  PT End of Session - 03/20/23 0850     Visit Number 4    Number of Visits 12    Date for PT Re-Evaluation 06/06/23    Authorization Type FOTO.    PT Start Time 620 [id number] 2014    PT Stop Time 0845    PT Time Calculation (min) 47 min    Activity Tolerance Patient tolerated treatment well    Behavior During Therapy WFL for tasks assessed/performed             Past Medical History:  Diagnosis Date   Anxiety    Arthritis    Asthma    Cancer (HCC)    cervical cancer; pt states a long long time ago   CHF (congestive heart failure) (HCC)    Coronary artery disease    Depression    Diabetes mellitus without complication (HCC)    Dyspnea    Edema of all four extremities    Essential (primary) hypertension 06/24/2020   Hyperlipemia    Type II diabetes mellitus (HCC) 03/22/2022   Past Surgical History:  Procedure Laterality Date   ABDOMINAL HYSTERECTOMY     ANTERIOR LAT LUMBAR FUSION N/A 11/05/2019   Procedure: Lumbar Four-Five Anterolateral lumbar interbody fusion with lateral plate;  Surgeon: Barnett Abu, MD;  Location: [facility];  Service: Neurosurgery;  Laterality: N/A;  anterolateral   BACK SURGERY     pins   BACK SURGERY  07/11/2008   BIOPSY  06/29/2021   Procedure: BIOPSY;  Surgeon: Dolores Frame, MD;  Location: [facility];  Service: Gastroenterology;;   BLADDER REPAIR     CARPAL TUNNEL RELEASE Right    CESAREAN SECTION  1982, 1984   COLONOSCOPY N/A 06/12/2013   rehman: normal   CORONARY PRESSURE/FFR STUDY N/A 10/20/2022   Procedure: CORONARY PRESSURE/FFR STUDY;  Surgeon: [name], MD;  Location: [facility];  Service: Cardiovascular;  Laterality: N/A;   ESOPHAGOGASTRODUODENOSCOPY (EGD) WITH PROPOFOL N/A 06/29/2021   Procedure: ESOPHAGOGASTRODUODENOSCOPY (EGD) WITH  PROPOFOL;  Surgeon: Dolores Frame, MD;  Location: [facility];  Service: Gastroenterology;  Laterality: N/A;  8:40   KNEE ARTHROSCOPY WITH MEDIAL MENISECTOMY Right 05/09/2019   Procedure: KNEE ARTHROSCOPY WITH MEDIAL MENISECTOMY AND LATERAL MENISECTOMY;  Surgeon: Vickki Hearing, MD;  Location: [facility];  Service: Orthopedics;  Laterality: Right;   KNEE ARTHROSCOPY WITH MEDIAL MENISECTOMY Left 06/27/2019   Procedure: KNEE ARTHROSCOPY WITH MEDIAL MENISCECTOMY;  Surgeon: Vickki Hearing, MD;  Location: [facility];  Service: Orthopedics;  Laterality: Left;   LEFT HEART CATH AND CORONARY ANGIOGRAPHY N/A 10/20/2022   Procedure: LEFT HEART CATH AND CORONARY ANGIOGRAPHY;  Surgeon: [name], MD;  Location: [facility];  Service: Cardiovascular;  Laterality: N/A;   TOTAL KNEE ARTHROPLASTY Left 03/15/2022   Procedure: TOTAL KNEE ARTHROPLASTY;  Surgeon: Vickki Hearing, MD;  Location: [facility];  Service: Orthopedics;  Laterality: Left;   TOTAL KNEE ARTHROPLASTY Right 02/21/2023   Procedure: TOTAL KNEE ARTHROPLASTY;  Surgeon: Vickki Hearing, MD;  Location: [facility];  Service: Orthopedics;  Laterality: Right;   Patient Active Problem List   Diagnosis Date Noted   S/P total knee replacement, right 02/21/23 03/08/2023   Primary osteoarthritis of right knee 02/21/2023   Lesion of vulva 10/06/2022   Lichen sclerosus et atrophicus 10/06/2022   S/P hysterectomy  with oophorectomy 10/06/2022   Hot flashes 10/06/2022   Current use of estrogen therapy 10/06/2022   Anxiety 03/22/2022   Asthma 03/22/2022   Bipolar 1 disorder (HCC) 03/22/2022   Chronic pain 03/22/2022   Edema 03/22/2022   Gastro-esophageal reflux 03/22/2022   Hyperlipidemia 03/22/2022   Insomnia 03/22/2022   Menopausal symptom 03/22/2022   Sinusitis 03/22/2022   Type II diabetes mellitus (HCC) 03/22/2022   Urinary incontinence 03/22/2022   Osteoarthritis of left knee 03/15/2022   History of operative procedure  on lumbosacral spinal structure 02/15/2022   Urge incontinence 09/02/2021   Cervical radiculopathy 12/30/2020   Pseudoarthrosis of lumbar spine 08/25/2020   Essential (primary) hypertension 06/24/2020   Body mass index (BMI) 45.0-49.9, adult (HCC) 05/06/2020   Carpal tunnel syndrome 04/01/2020   Spondylolisthesis, lumbar region 11/22/2019   Localized swelling of both lower legs 11/12/2019   Lumbar stenosis 11/05/2019   S/P left knee arthroscopy 06/27/2019 07/01/2019   Derangement of posterior horn of medial meniscus of left knee    Primary osteoarthritis of left knee    S/P right knee arthroscopy 05/09/19 05/29/2019   Depression 07/24/2014   LOWER LEG, ARTHRITIS, DEGEN./OSTEO 02/18/2009   DERANGEMENT MENISCUS 02/18/2009   ANSERINE BURSITIS, RIGHT 02/18/2009    REFERRING PROVIDER: Fuller Canada MD  REFERRING DIAG: Right total knee replacement  THERAPY DIAG:  Chronic pain of right knee  Stiffness of right knee, not elsewhere classified  Localized edema  Rationale for Evaluation and Treatment: Rehabilitation  ONSET DATE: 02/21/23 (surgery date).  SUBJECTIVE:   SUBJECTIVE STATEMENT: Pain around a 5.  PERTINENT HISTORY: Left TKA, CTS, DM. PAIN:  Are you having pain? Yes: NPRS scale: 5/10 Pain location: Right knee. Pain description: Throbbing. Aggravating factors: As above. Relieving factors: As above.  PRECAUTIONS: Other: No ultrasound.   WEIGHT BEARING RESTRICTIONS: No  FALLS:  Has patient fallen in last 6 months? No  LIVING ENVIRONMENT: Lives with: lives with their family Lives in: House/apartment Stairs: 3 external steps.  Non-reciprocating. Has following equipment at home: Dan Humphreys - 2 wheeled  OCCUPATION: Disabled.  PLOF: Independent with basic ADLs  PATIENT GOALS: Not have knee pain and  move better.   OBJECTIVE:   PATIENT SURVEYS:  FOTO .  EDEMA:  Circumferential: Right 4 cms > left.  PALPATION: C/o diffuse anterior right knee pain.   Post-op bandage is intact.  Visible ecchymosis.  ROM:  -15 degrees of right knee extension in supine and -10 degrees passive with flexion to 95 degrees.  LOWER EXTREMITY MMT:  Patient able to perform an antigravity right SLR and SAQ.   GAIT: The patient is walking safely with a FWW with decreases step and streide length.   TODAY'S TREATMENT:                                                                                                                              DATE: 03/17/23:  EXERCISE LOG  Exercise Repetitions and Resistance Comments  Nustep Level 3 x 16 minutes moving seat forward x 2 to increase flexion.   Rockerboard In parallel bars x 3 minutes.   SAQ's 5# x 5 minutes.           f/b LE elevation and vasopneumatic x 15 minutes.    LOWER EXTREMITY ROM:     Active/Passive  Right Eval(03/08/23) Right(03/17/23)  Knee flexion 95 115/120  Knee extension -15(A)/ -10(P)        PATIENT EDUCATION:  Education details: Discussed HEP. Person educated: Patient Education method: Explanation Education comprehension: verbalized understanding  HOME EXERCISE PROGRAM:   ASSESSMENT:  CLINICAL IMPRESSION: Patient progressing very well toward goals.  She is pleased with her progress.  OBJECTIVE IMPAIRMENTS: Abnormal gait, decreased activity tolerance, decreased ROM, decreased strength, increased edema, and pain.   ACTIVITY LIMITATIONS: carrying, lifting, standing, bathing, and locomotion level  PARTICIPATION LIMITATIONS: meal prep, cleaning, and laundry  PERSONAL FACTORS: Time since onset of injury/illness/exacerbation are also affecting patient's functional outcome.   REHAB POTENTIAL: Excellent  CLINICAL DECISION MAKING: Stable/uncomplicated  EVALUATION COMPLEXITY: Low   GOALS:  SHORT TERM GOALS: Target date: 03/22/23.  Ind with an initial HEP. Goal status: INITIAL  2.  Full active right knee extension.  Goal status:  INITIAL   LONG TERM GOALS: Target date: 06/06/23  Ind with an advanced HEP.  Goal status: INITIAL  2.  Active right knee flexion to 115 degrees+ so the patient can perform functional tasks and do so with pain not > 2-3/10.  Goal status: INITIAL  3.  Increase right hip and knee strength to a solid 4+ to 5/5 to provide good stability for accomplishment of functional activities.  Goal status: INITIAL  4.  Perform a reciprocating stair gait with one railing with pain not > 2-3/10.  Goal status: INITIAL  5.  Patient walk safely with a straight cane.  Goal status: INITIAL  PLAN:  PT FREQUENCY: 2x/week  PT DURATION: 6 weeks  PLANNED INTERVENTIONS: Therapeutic exercises, Therapeutic activity, Neuromuscular re-education, Gait training, Patient/Family education, Self Care, Cryotherapy, Moist heat, Vasopneumatic device, and Manual therapy  PLAN FOR NEXT SESSION: Progress per TKA protocol.  LE elevation and vasopneumatic.   Domingo Fuson, [place], PT 03/20/2023, 8:51 AM

## 2023-03-20 NOTE — Patient Instructions (Signed)
[name]  03/20/2023     @PREFPERIOPPHARMACY @   Your procedure is scheduled on 03/23/2023.  Report to [facility] at 9:30 A.M.  Call this number if you have problems the morning of surgery:  [phone]  If you experience any cold or flu symptoms such as cough, fever, chills, shortness of breath, etc. between now and your scheduled surgery, please notify us at the above number.   Remember:   Do not eat or drink after midnight.      Take these medicines the morning of surgery with A SIP OF WATER : Abilify Prozac Gabapentin Robaxin Metoprolol Omeprazole and Oxycodone    Do not wear jewelry, make-up or nail polish, including gel polish,  artificial nails, or any other type of covering on natural nails (fingers and  toes).  Do not wear lotions, powders, or perfumes, or deodorant.  Do not shave 48 hours prior to surgery.  Men may shave face and neck.  Do not bring valuables to the hospital.  [facility] is not responsible for any belongings or valuables.  Contacts, dentures or bridgework may not be worn into surgery.  Leave your suitcase in the car.  After surgery it may be brought to your room.  For patients admitted to the hospital, discharge time will be determined by your treatment team.  Patients discharged the day of surgery will not be allowed to drive home.   Name and phone number of your driver:   Family Special instructions:  N/A  Please read over the following fact sheets that you were given. Care and Recovery After Surgery  Botulinum Toxin Bladder Injection  A botulinum toxin bladder injection is a procedure to treat an overactive bladder. During the procedure, a drug called botulinum toxin is injected into the bladder through a long, thin needle. This drug relaxes the bladder muscles and reduces overactivity. You may need this procedure if your medicines are not working or you cannot take them. The procedure may be repeated as needed. The treatment is done  once and it usually lasts for 6 months. Your health care provider will monitor you to see how well you respond. Tell a health care provider about: Any allergies you have. All medicines you are taking, including vitamins, herbs, eye drops, creams, and over-the-counter medicines. Any problems you or family members have had with anesthetic medicines. Any bleeding problems you have. Any surgeries you have had. Any medical conditions you have. Any previous reactions to a botulinum toxin injection. Any symptoms of urinary tract infection. These include chills, fever, a burning feeling when passing urine, and needing to pass urine often. Whether you are pregnant or may be pregnant. What are the risks? Generally this is a safe procedure. However, problems may occur, including: Not being able to pass urine. If this happens, you may need to have your bladder emptied with a thin tube (urinary catheter). Bleeding. Urinary tract infection. Allergic reaction to the botulinum toxin. Pain or burning when passing urine. Damage to nearby structures or organs. What happens before the procedure? When to stop eating and drinking Follow instructions from your health care provider about what you may eat and drink before your procedure. These may include: 8 hours before the procedure Stop eating most foods. Do not eat meat, fried foods, or fatty foods. Eat only light foods, such as toast or crackers. All liquids are okay except energy drinks and alcohol. 6 hours before the procedure Stop eating. Drink only clear liquids, such as  water, clear fruit juice, black coffee, plain tea, and sports drinks. Do not drink energy drinks or alcohol. 2 hours before the procedure Stop drinking all liquids. You may be allowed to take medicines with small sips of water. If you do not follow your health care provider's instructions, your procedure may be delayed or canceled. Medicines Ask your health care provider  about: Changing or stopping your regular medicines. This is especially important if you are taking diabetes medicines or blood thinners. Taking medicines such as aspirin and ibuprofen. These medicines can thin your blood. Do not take these medicines unless your health care provider tells you to take them. Taking over-the-counter medicines, vitamins, herbs, and supplements. General instructions Ask your health care provider what steps will be taken to help prevent infection. These steps may include: Removing hair at the procedure site. Washing skin with a germ-killing soap. Taking antibiotic medicine. If you will be going home right after the procedure, plan to have a responsible adult: Take you home from the hospital or clinic. You will not be allowed to drive. Care for you for the time you are told. What happens during the procedure?  You will be asked to empty your bladder. An IV will be inserted into one of your veins. You will be given one or more of the following: A medicine to help you relax (sedative). A medicine to numb the area (local anesthetic). A medicine to make you fall asleep (general anesthetic). A long, thin scope called a cystoscope will be passed into your bladder through the part of the body that carries urine from your bladder (urethra). The cystoscope will be used to fill your bladder with water. A long needle will be passed through the cystoscope and into the bladder. The botulinum toxin will be injected into your bladder. It may be injected into multiple areas of your bladder. The cystoscope will be removed and your bladder will be emptied with a urinary catheter. The procedure may vary among health care providers and hospitals. What can I expect after the procedure? After your procedure, it is common to have: Blood-tinged urine. Burning or soreness when you pass urine. Follow these instructions at home: Medicines Take over-the-counter and prescription  medicines only as told by your health care provider. If you were prescribed an antibiotic medicine, take it as told by your health care provider. Do not stop using the antibiotic even if you start to feel better. General instructions  If you were given a sedative during the procedure, it can affect you for several hours. Do not drive or operate machinery until your health care provider says that it is safe. Drink enough fluid to keep your urine pale yellow. Return to your normal activities as told by your health care provider. Ask your health care provider what activities are safe for you. Keep all follow-up visits. Contact a health care provider if you have: A fever or chills. Blood-tinged urine for more than one day after your procedure. Worsening pain or burning when you pass urine. Pain or burning when passing urine for more than two days after your procedure. Trouble emptying your bladder. Get help right away if you: Have bright red blood in your urine. Are unable to pass urine. Summary A botulinum toxin bladder injection is a procedure to treat an overactive bladder. This is generally a safe procedure. However, problems may occur, including not being able to pass urine, bleeding, infection, pain, and an allergic reaction to the botulinum toxin. You will be  told when to stop eating and drinking, and what medicines to change or stop. Follow instructions carefully. After the procedure, it is common to have blood in your urine and to have soreness or burning when passing urine. Contact a health care provider if you have a fever, blood in your urine for more than a few days, or trouble passing urine. Get help right away if you have bright red blood in your urine, or if you are unable to pass urine. This information is not intended to replace advice given to you by your health care provider. Make sure you discuss any questions you have with your health care provider. Document Revised:  01/01/2021 Document Reviewed: 01/01/2021 Elsevier Patient Education  2024 [employer] Cystoscopy Cystoscopy is a procedure that is used to help diagnose and sometimes treat conditions that affect the lower urinary tract. The lower urinary tract includes the bladder and the urethra. The urethra is the tube that drains urine from the bladder. Cystoscopy is done using a thin, tube-shaped instrument with a light and camera at the end (cystoscope). The cystoscope may be hard or flexible, depending on the goal of the procedure. The cystoscope is inserted through the urethra, into the bladder. Cystoscopy may be recommended if you have: Urinary tract infections that keep coming back. Blood in the urine (hematuria). An inability to control when you urinate (urinary incontinence) or an overactive bladder. Unusual cells found in a urine sample. A blockage in the urethra, such as a urinary stone. Painful urination. An abnormality in the bladder found during an intravenous pyelogram (IVP) or CT scan. Cystoscopy may also be done to remove a sample of tissue to be examined under a microscope (biopsy). Tell a health care provider about: Any allergies you have. All medicines you are taking, including vitamins, herbs, eye drops, creams, and over-the-counter medicines. Any problems you or family members have had with anesthetic medicines. Any blood disorders you have. Any surgeries you have had. Any medical conditions you have. Whether you are pregnant or may be pregnant. What are the risks? Generally, this is a safe procedure. However, problems may occur, including: Infection. Bleeding. Allergic reactions to medicines. Damage to other structures or organs. What happens before the procedure? Medicines Ask your health care provider about: Changing or stopping your regular medicines. This is especially important if you are taking diabetes medicines or blood thinners. Taking medicines such as aspirin and  ibuprofen. These medicines can thin your blood. Do not take these medicines unless your health care provider tells you to take them. Taking over-the-counter medicines, vitamins, herbs, and supplements. Tests You may have an exam or testing, such as: X-rays of the bladder, urethra, or kidneys. CT scan of the abdomen or pelvis. Urine tests to check for signs of infection. General instructions Follow instructions from your health care provider about eating or drinking restrictions. Ask your health care provider what steps will be taken to help prevent infection. These steps may include: Washing skin with a germ-killing soap. Taking antibiotic medicine. Plan to have a responsible adult take you home from the hospital or clinic. What happens during the procedure?  You will be given one or more of the following: A medicine to help you relax (sedative). A medicine to numb the area (local anesthetic). The area around the opening of your urethra will be cleaned. The cystoscope will be passed through your urethra into your bladder. Germ-free (sterile) fluid will flow through the cystoscope to fill your bladder. The fluid will stretch  your bladder so that your health care provider can clearly examine your bladder walls. Your doctor will look at the urethra and bladder. Your doctor may take a biopsy or remove stones. The cystoscope will be removed, and your bladder will be emptied. The procedure may vary among health care providers and hospitals. What can I expect after the procedure? After the procedure, it is common to have: Some soreness or pain in your abdomen and urethra. Urinary symptoms. These include: Mild pain or burning when you urinate. Pain should stop within a few minutes after you urinate. This may last for up to 1 week. A small amount of blood in your urine for several days. Feeling like you need to urinate but producing only a small amount of urine. Follow these instructions at  home: Medicines Take over-the-counter and prescription medicines only as told by your health care provider. If you were prescribed an antibiotic medicine, take it as told by your health care provider. Do not stop taking the antibiotic even if you start to feel better. General instructions Return to your normal activities as told by your health care provider. Ask your health care provider what activities are safe for you. If you were given a sedative during the procedure, it can affect you for several hours. Do not drive or operate machinery until your health care provider says that it is safe. Watch for any blood in your urine. If the amount of blood in your urine increases, call your health care provider. Follow instructions from your health care provider about eating or drinking restrictions. If a tissue sample was removed for testing (biopsy) during your procedure, it is up to you to get your test results. Ask your health care provider, or the department that is doing the test, when your results will be ready. Drink enough fluid to keep your urine pale yellow. Keep all follow-up visits. This is important. Contact a health care provider if: You have pain that gets worse or does not get better with medicine, especially pain when you urinate. You have trouble urinating. You have more blood in your urine. Get help right away if: You have blood clots in your urine. You have abdominal pain. You have a fever or chills. You are unable to urinate. Summary Cystoscopy is a procedure that is used to help diagnose and sometimes treat conditions that affect the lower urinary tract. Cystoscopy is done using a thin, tube-shaped instrument with a light and camera at the end. After the procedure, it is common to have some soreness or pain in your abdomen and urethra. Watch for any blood in your urine. If the amount of blood in your urine increases, call your health care provider. If you were prescribed an  antibiotic medicine, take it as told by your health care provider. Do not stop taking the antibiotic even if you start to feel better. This information is not intended to replace advice given to you by your health care provider. Make sure you discuss any questions you have with your health care provider. Document Revised: 03/10/2021 Document Reviewed: 02/07/2020 Elsevier Patient Education  2024 [employer]  General Anesthesia, Adult General anesthesia is the use of medicine to make you fall asleep (unconscious) for a medical procedure. General anesthesia must be used for certain procedures. It is often recommended for surgery or procedures that: Last a long time. Require you to be still or in an unusual position. Are major and can cause blood loss. Affect your breathing. The medicines  used for general anesthesia are called general anesthetics. During general anesthesia, these medicines are given along with medicines that: Prevent pain. Control your blood pressure. Relax your muscles. Prevent nausea and vomiting after the procedure. Tell a health care provider about: Any allergies you have. All medicines you are taking, including vitamins, herbs, eye drops, creams, and over-the-counter medicines. Your history of any: Medical conditions you have, including: High blood pressure. Bleeding problems. Diabetes. Heart or lung conditions, such as: Heart failure. Sleep apnea. Asthma. Chronic obstructive pulmonary disease (COPD). Current or recent illnesses, such as: Upper respiratory, chest, or ear infections. Cough or fever. Tobacco or drug use, including marijuana or alcohol use. Depression or anxiety. Surgeries and types of anesthetics you have had. Problems you or family members have had with anesthetic medicines. Whether you are pregnant or may be pregnant. Whether you have any chipped or loose teeth, dentures, caps, bridgework, or issues with your mouth, swallowing, or  choking. What are the risks? Your health care provider will talk with you about risks. These may include: Allergic reaction to the medicines. Lung and heart problems. Inhaling food or liquid from the stomach into the lungs (aspiration). Nerve injury. Injury to the lips, mouth, teeth, or gums. Stroke. Waking up during your procedure and being unable to move. This is rare. These problems are more likely to develop if you are having a major surgery or if you have an advanced or serious medical condition. You can prevent some of these complications by answering all of your health care provider's questions thoroughly and by following all instructions before your procedure. General anesthesia can cause side effects, including: Nausea or vomiting. A sore throat or hoarseness from the breathing tube. Wheezing or coughing. Shaking chills or feeling cold. Body aches. Sleepiness. Confusion, agitation (delirium), or anxiety. What happens before the procedure? When to stop eating and drinking Follow instructions from your health care provider about what you may eat and drink before your procedure. If you do not follow your health care provider's instructions, your procedure may be delayed or canceled. Medicines Ask your health care provider about: Changing or stopping your regular medicines. These include any diabetes medicines or blood thinners you take. Taking medicines such as aspirin and ibuprofen. These medicines can thin your blood. Do not take them unless your health care provider tells you to. Taking over-the-counter medicines, vitamins, herbs, and supplements. General instructions Do not use any products that contain nicotine or tobacco for at least 4 weeks before the procedure. These products include cigarettes, chewing tobacco, and vaping devices, such as e-cigarettes. If you need help quitting, ask your health care provider. If you brush your teeth on the morning of the procedure, make  sure to spit out all of the water and toothpaste. If told by your health care provider, bring your sleep apnea device with you to surgery (if applicable). If you will be going home right after the procedure, plan to have a responsible adult: Take you home from the hospital or clinic. You will not be allowed to drive. Care for you for the time you are told. What happens during the procedure?  An IV will be inserted into one of your veins. You will be given one or more of the following through a face mask or IV: A sedative. This helps you relax. Anesthesia. This will: Numb certain areas of your body. Make you fall asleep for surgery. After you are unconscious, a breathing tube may be inserted down your throat to help you  breathe. This will be removed before you wake up. An anesthesia provider, such as an anesthesiologist, will stay with you throughout your procedure. The anesthesia provider will: Keep you comfortable and safe by continuing to give you medicines and adjusting the amount of medicine that you get. Monitor your blood pressure, heart rate, and oxygen levels to make sure that the anesthetics do not cause any problems. The procedure may vary among health care providers and hospitals. What happens after the procedure? Your blood pressure, temperature, heart rate, breathing rate, and blood oxygen level will be monitored until you leave the hospital or clinic. You will wake up in a recovery area. You may wake up slowly. You may be given medicine to help you with pain, nausea, or any other side effects from the anesthesia. Summary General anesthesia is the use of medicine to make you fall asleep (unconscious) for a medical procedure. Follow your health care provider's instructions about when to stop eating, drinking, or taking certain medicines before your procedure. Plan to have a responsible adult take you home from the hospital or clinic. This information is not intended to replace  advice given to you by your health care provider. Make sure you discuss any questions you have with your health care provider. Document Revised: 09/23/2021 Document Reviewed: 09/23/2021 Elsevier Patient Education  2024 [employer].

## 2023-03-22 ENCOUNTER — Ambulatory Visit: Payer: 59

## 2023-03-22 DIAGNOSIS — R600 Localized edema: Secondary | ICD-10-CM

## 2023-03-22 DIAGNOSIS — G8929 Other chronic pain: Secondary | ICD-10-CM

## 2023-03-22 DIAGNOSIS — M25561 Pain in right knee: Secondary | ICD-10-CM | POA: Diagnosis not present

## 2023-03-22 DIAGNOSIS — M25661 Stiffness of right knee, not elsewhere classified: Secondary | ICD-10-CM

## 2023-03-22 NOTE — Therapy (Signed)
OUTPATIENT PHYSICAL THERAPY LOWER EXTREMITY TREATMENT   Patient Name: [name] MRN: [id number] DOB:02-25-1961, 62 y.o., female Today's Date: 03/22/2023  END OF SESSION:  PT End of Session - 03/22/23 0803     Visit Number 5    Number of Visits 12    Date for PT Re-Evaluation 06/06/23    Authorization Type FOTO.    PT Start Time 0800    PT Stop Time 0901    PT Time Calculation (min) 61 min    Activity Tolerance Patient tolerated treatment well    Behavior During Therapy WFL for tasks assessed/performed             Past Medical History:  Diagnosis Date   Anxiety    Arthritis    Asthma    Cancer (HCC)    cervical cancer; pt states a long long time ago   CHF (congestive heart failure) (HCC)    Coronary artery disease    Depression    Diabetes mellitus without complication (HCC)    Dyspnea    Edema of all four extremities    Essential (primary) hypertension 06/24/2020   Hyperlipemia    Type II diabetes mellitus (HCC) 03/22/2022   Past Surgical History:  Procedure Laterality Date   ABDOMINAL HYSTERECTOMY     ANTERIOR LAT LUMBAR FUSION N/A 11/05/2019   Procedure: Lumbar Four-Five Anterolateral lumbar interbody fusion with lateral plate;  Surgeon: Barnett Abu, MD;  Location: [facility];  Service: Neurosurgery;  Laterality: N/A;  anterolateral   BACK SURGERY     pins   BACK SURGERY  07/11/2008   BIOPSY  06/29/2021   Procedure: BIOPSY;  Surgeon: Dolores Frame, MD;  Location: [facility];  Service: Gastroenterology;;   BLADDER REPAIR     CARPAL TUNNEL RELEASE Right    CESAREAN SECTION  1982, 1984   COLONOSCOPY N/A 06/12/2013   rehman: normal   CORONARY PRESSURE/FFR STUDY N/A 10/20/2022   Procedure: CORONARY PRESSURE/FFR STUDY;  Surgeon: [name], MD;  Location: [facility];  Service: Cardiovascular;  Laterality: N/A;   ESOPHAGOGASTRODUODENOSCOPY (EGD) WITH PROPOFOL N/A 06/29/2021   Procedure: ESOPHAGOGASTRODUODENOSCOPY (EGD) WITH  PROPOFOL;  Surgeon: Dolores Frame, MD;  Location: [facility];  Service: Gastroenterology;  Laterality: N/A;  8:40   KNEE ARTHROSCOPY WITH MEDIAL MENISECTOMY Right 05/09/2019   Procedure: KNEE ARTHROSCOPY WITH MEDIAL MENISECTOMY AND LATERAL MENISECTOMY;  Surgeon: Vickki Hearing, MD;  Location: [facility];  Service: Orthopedics;  Laterality: Right;   KNEE ARTHROSCOPY WITH MEDIAL MENISECTOMY Left 06/27/2019   Procedure: KNEE ARTHROSCOPY WITH MEDIAL MENISCECTOMY;  Surgeon: Vickki Hearing, MD;  Location: [facility];  Service: Orthopedics;  Laterality: Left;   LEFT HEART CATH AND CORONARY ANGIOGRAPHY N/A 10/20/2022   Procedure: LEFT HEART CATH AND CORONARY ANGIOGRAPHY;  Surgeon: [name], MD;  Location: [facility];  Service: Cardiovascular;  Laterality: N/A;   TOTAL KNEE ARTHROPLASTY Left 03/15/2022   Procedure: TOTAL KNEE ARTHROPLASTY;  Surgeon: Vickki Hearing, MD;  Location: [facility];  Service: Orthopedics;  Laterality: Left;   TOTAL KNEE ARTHROPLASTY Right 02/21/2023   Procedure: TOTAL KNEE ARTHROPLASTY;  Surgeon: Vickki Hearing, MD;  Location: [facility];  Service: Orthopedics;  Laterality: Right;   Patient Active Problem List   Diagnosis Date Noted   S/P total knee replacement, right 02/21/23 03/08/2023   Primary osteoarthritis of right knee 02/21/2023   Lesion of vulva 10/06/2022   Lichen sclerosus et atrophicus 10/06/2022   S/P hysterectomy  with oophorectomy 10/06/2022   Hot flashes 10/06/2022   Current use of estrogen therapy 10/06/2022   Anxiety 03/22/2022   Asthma 03/22/2022   Bipolar 1 disorder (HCC) 03/22/2022   Chronic pain 03/22/2022   Edema 03/22/2022   Gastro-esophageal reflux 03/22/2022   Hyperlipidemia 03/22/2022   Insomnia 03/22/2022   Menopausal symptom 03/22/2022   Sinusitis 03/22/2022   Type II diabetes mellitus (HCC) 03/22/2022   Urinary incontinence 03/22/2022   Osteoarthritis of left knee 03/15/2022   History of operative procedure  on lumbosacral spinal structure 02/15/2022   Urge incontinence 09/02/2021   Cervical radiculopathy 12/30/2020   Pseudoarthrosis of lumbar spine 08/25/2020   Essential (primary) hypertension 06/24/2020   Body mass index (BMI) 45.0-49.9, adult (HCC) 05/06/2020   Carpal tunnel syndrome 04/01/2020   Spondylolisthesis, lumbar region 11/22/2019   Localized swelling of both lower legs 11/12/2019   Lumbar stenosis 11/05/2019   S/P left knee arthroscopy 06/27/2019 07/01/2019   Derangement of posterior horn of medial meniscus of left knee    Primary osteoarthritis of left knee    S/P right knee arthroscopy 05/09/19 05/29/2019   Depression 07/24/2014   LOWER LEG, ARTHRITIS, DEGEN./OSTEO 02/18/2009   DERANGEMENT MENISCUS 02/18/2009   ANSERINE BURSITIS, RIGHT 02/18/2009    REFERRING PROVIDER: Fuller Canada MD  REFERRING DIAG: Right total knee replacement  THERAPY DIAG:  Chronic pain of right knee  Stiffness of right knee, not elsewhere classified  Localized edema  Rationale for Evaluation and Treatment: Rehabilitation  ONSET DATE: 02/21/23 (surgery date).  SUBJECTIVE:   SUBJECTIVE STATEMENT: Pt reports pain at 7/10 today  PERTINENT HISTORY: Left TKA, CTS, DM. PAIN:  Are you having pain? Yes: NPRS scale: 7/10 Pain location: Right knee. Pain description: Throbbing. Aggravating factors: As above. Relieving factors: As above.  PRECAUTIONS: Other: No ultrasound.   WEIGHT BEARING RESTRICTIONS: No  FALLS:  Has patient fallen in last 6 months? No  LIVING ENVIRONMENT: Lives with: lives with their family Lives in: House/apartment Stairs: 3 external steps.  Non-reciprocating. Has following equipment at home: Dan Humphreys - 2 wheeled  OCCUPATION: Disabled.  PLOF: Independent with basic ADLs  PATIENT GOALS: Not have knee pain and  move better.   OBJECTIVE:   PATIENT SURVEYS:  FOTO .  EDEMA:  Circumferential: Right 4 cms > left.  PALPATION: C/o diffuse anterior right  knee pain.  Post-op bandage is intact.  Visible ecchymosis.  ROM:  -15 degrees of right knee extension in supine and -10 degrees passive with flexion to 95 degrees.  LOWER EXTREMITY MMT:  Patient able to perform an antigravity right SLR and SAQ.   GAIT: The patient is walking safely with a FWW with decreases step and streide length.   TODAY'S TREATMENT:                                                                                                                              DATE: 03/22/23:  EXERCISE LOG  Exercise Repetitions and Resistance Comments  Nustep Level 3 x 16 mins; seat 8-6   Rockerboard In parallel bars x 4 minutes.   Forward Step Ups 6" box x 20 reps   LAQ's 3# x 25 reps bil   Seated Marches 3# x 25 reps bil   Seated Hip Abduction Red x 3 mins   Seated Hip Adduction 3 mins   Seated Ham Curls Red x 25 reps bil   STS x10 reps   Static Extension Stretch Zero Knee x 4 mins    Modalities  Date:  Vaso: Knee, Low pressure; 34 degrees, 15 mins, Pain and Edema   LOWER EXTREMITY ROM:     Active/Passive  Right Eval(03/08/23) Right(03/17/23)  Knee flexion 95 115/120  Knee extension -15(A)/ -10(P)        PATIENT EDUCATION:  Education details: Discussed HEP. Person educated: Patient Education method: Explanation Education comprehension: verbalized understanding  HOME EXERCISE PROGRAM:   ASSESSMENT:  CLINICAL IMPRESSION:  Pt arrives for today's treatment session reporting 7/10 right knee pain.  Pt able to increase FOTO score to 64 today.   Pt able to demonstrate 122 degrees of active right knee flexion and -8 degrees of active right knee extension.  Pt encouraged to use Zero Knee at home to tolerance to assist with increasing right knee extension.  Pt able to tolerate introduction to forward step ups with min cues for sequencing and to avoid use of BUEs to pull up with.  Normal responses to vaso noted upon removal.  Pt reported  decreased pain at completion of today's treatment session.  OBJECTIVE IMPAIRMENTS: Abnormal gait, decreased activity tolerance, decreased ROM, decreased strength, increased edema, and pain.   ACTIVITY LIMITATIONS: carrying, lifting, standing, bathing, and locomotion level  PARTICIPATION LIMITATIONS: meal prep, cleaning, and laundry  PERSONAL FACTORS: Time since onset of injury/illness/exacerbation are also affecting patient's functional outcome.   REHAB POTENTIAL: Excellent  CLINICAL DECISION MAKING: Stable/uncomplicated  EVALUATION COMPLEXITY: Low   GOALS:  SHORT TERM GOALS: Target date: 03/22/23.  Ind with an initial HEP. Goal status: MET  2.  Full active right knee extension.   9/11: -8 degrees Goal status: IN PROGRESS   LONG TERM GOALS: Target date: 06/06/23  Ind with an advanced HEP.  Goal status: IN PROGRESS  2.  Active right knee flexion to 115 degrees+ so the patient can perform functional tasks and do so with pain not > 2-3/10.  9/11: 122 degrees Goal status: MET  3.  Increase right hip and knee strength to a solid 4+ to 5/5 to provide good stability for accomplishment of functional activities.  Goal status: IN PROGRESS  4.  Perform a reciprocating stair gait with one railing with pain not > 2-3/10.  Goal status: IN PROGRESS  5.  Patient walk safely with a straight cane.  Goal status: MET  PLAN:  PT FREQUENCY: 2x/week  PT DURATION: 6 weeks  PLANNED INTERVENTIONS: Therapeutic exercises, Therapeutic activity, Neuromuscular re-education, Gait training, Patient/Family education, Self Care, Cryotherapy, Moist heat, Vasopneumatic device, and Manual therapy  PLAN FOR NEXT SESSION: Progress per TKA protocol.  LE elevation and vasopneumatic.   Newman Pies, PTA 03/22/2023, 9:03 AM

## 2023-03-23 ENCOUNTER — Encounter (HOSPITAL_COMMUNITY)
Admission: RE | Disposition: A | Discharge status: Home / Self Care | Payer: Self-pay | Source: Home / Self Care | Attending: Urology

## 2023-03-23 ENCOUNTER — Encounter (HOSPITAL_COMMUNITY): Payer: Self-pay | Admitting: Urology

## 2023-03-23 ENCOUNTER — Ambulatory Visit (HOSPITAL_COMMUNITY): Payer: 59 | Admitting: Certified Registered"

## 2023-03-23 ENCOUNTER — Ambulatory Visit (HOSPITAL_COMMUNITY)
Admission: RE | Admit: 2023-03-23 | Discharge: 2023-03-23 | Disposition: A | Discharge status: Home / Self Care | Payer: 59 | Attending: Urology | Admitting: Urology

## 2023-03-23 ENCOUNTER — Ambulatory Visit (HOSPITAL_BASED_OUTPATIENT_CLINIC_OR_DEPARTMENT_OTHER): Payer: 59 | Admitting: Certified Registered"

## 2023-03-23 DIAGNOSIS — E119 Type 2 diabetes mellitus without complications: Secondary | ICD-10-CM | POA: Insufficient documentation

## 2023-03-23 DIAGNOSIS — Z7984 Long term (current) use of oral hypoglycemic drugs: Secondary | ICD-10-CM | POA: Insufficient documentation

## 2023-03-23 DIAGNOSIS — I110 Hypertensive heart disease with heart failure: Secondary | ICD-10-CM | POA: Diagnosis not present

## 2023-03-23 DIAGNOSIS — N3281 Overactive bladder: Secondary | ICD-10-CM | POA: Diagnosis present

## 2023-03-23 DIAGNOSIS — I2510 Atherosclerotic heart disease of native coronary artery without angina pectoris: Secondary | ICD-10-CM | POA: Insufficient documentation

## 2023-03-23 DIAGNOSIS — Z79899 Other long term (current) drug therapy: Secondary | ICD-10-CM | POA: Diagnosis not present

## 2023-03-23 DIAGNOSIS — Z87891 Personal history of nicotine dependence: Secondary | ICD-10-CM | POA: Insufficient documentation

## 2023-03-23 DIAGNOSIS — J45909 Unspecified asthma, uncomplicated: Secondary | ICD-10-CM | POA: Insufficient documentation

## 2023-03-23 DIAGNOSIS — I509 Heart failure, unspecified: Secondary | ICD-10-CM | POA: Insufficient documentation

## 2023-03-23 DIAGNOSIS — N3946 Mixed incontinence: Secondary | ICD-10-CM | POA: Diagnosis not present

## 2023-03-23 DIAGNOSIS — N952 Postmenopausal atrophic vaginitis: Secondary | ICD-10-CM | POA: Diagnosis not present

## 2023-03-23 DIAGNOSIS — Z90710 Acquired absence of both cervix and uterus: Secondary | ICD-10-CM | POA: Diagnosis not present

## 2023-03-23 HISTORY — PX: CYSTOSCOPY WITH INJECTION: SHX1424

## 2023-03-23 LAB — GLUCOSE, CAPILLARY
Glucose-Capillary: 128 mg/dL — ABNORMAL HIGH (ref 70–99)
Glucose-Capillary: 119 mg/dL — ABNORMAL HIGH (ref 70–99)

## 2023-03-23 SURGERY — CYSTOSCOPY, WITH INJECTION OF BLADDER NECK OR BLADDER WALL
Anesthesia: General

## 2023-03-23 MED ORDER — CHLORHEXIDINE GLUCONATE 0.12 % MT SOLN
15.0000 mL | Freq: Once | OROMUCOSAL | Status: AC
  Administered 2023-03-23: 15 mL via OROMUCOSAL

## 2023-03-23 MED ORDER — SODIUM CHLORIDE (PF) 0.9 % IJ SOLN
INTRAMUSCULAR | Status: DC | PRN
  Administered 2023-03-23: 20 mL via INTRAVENOUS

## 2023-03-23 MED ORDER — LIDOCAINE HCL (PF) 2 % IJ SOLN
INTRAMUSCULAR | Status: AC
  Filled 2023-03-23: qty 5

## 2023-03-23 MED ORDER — LIDOCAINE HCL (CARDIAC) PF 100 MG/5ML IV SOSY
PREFILLED_SYRINGE | INTRAVENOUS | Status: DC | PRN
  Administered 2023-03-23: 80 mg via INTRAVENOUS

## 2023-03-23 MED ORDER — ONDANSETRON HCL 4 MG/2ML IJ SOLN: 4.0000 mg | Freq: Once | INTRAMUSCULAR | Status: DC | PRN

## 2023-03-23 MED ORDER — FENTANYL CITRATE (PF) 250 MCG/5ML IJ SOLN
INTRAMUSCULAR | Status: DC | PRN
  Administered 2023-03-23 (×4): 25 ug via INTRAVENOUS

## 2023-03-23 MED ORDER — WATER FOR IRRIGATION, STERILE IR SOLN
Status: DC | PRN
  Administered 2023-03-23: 500 mL

## 2023-03-23 MED ORDER — ORAL CARE MOUTH RINSE: 15.0000 mL | Freq: Once | OROMUCOSAL | Status: AC

## 2023-03-23 MED ORDER — TRAMADOL HCL 50 MG PO TABS: 50.0000 mg | ORAL_TABLET | Freq: Four times a day (QID) | ORAL | 0 refills | Status: AC | PRN

## 2023-03-23 MED ORDER — OXYCODONE HCL 5 MG PO TABS: 5.0000 mg | ORAL_TABLET | Freq: Once | ORAL | Status: DC | PRN

## 2023-03-23 MED ORDER — FENTANYL CITRATE PF 50 MCG/ML IJ SOSY: 25.0000 ug | PREFILLED_SYRINGE | INTRAMUSCULAR | Status: DC | PRN

## 2023-03-23 MED ORDER — PHENYLEPHRINE 80 MCG/ML (10ML) SYRINGE FOR IV PUSH (FOR BLOOD PRESSURE SUPPORT)
PREFILLED_SYRINGE | INTRAVENOUS | Status: DC | PRN
Start: 2023-03-23 — End: 2023-03-23
  Administered 2023-03-23: 160 ug via INTRAVENOUS
  Administered 2023-03-23: 80 ug via INTRAVENOUS

## 2023-03-23 MED ORDER — CHLORHEXIDINE GLUCONATE 0.12 % MT SOLN: 15.0000 mL | Freq: Once | OROMUCOSAL | Status: DC

## 2023-03-23 MED ORDER — PHENYLEPHRINE 80 MCG/ML (10ML) SYRINGE FOR IV PUSH (FOR BLOOD PRESSURE SUPPORT)
PREFILLED_SYRINGE | INTRAVENOUS | Status: AC
  Filled 2023-03-23: qty 10

## 2023-03-23 MED ORDER — ONDANSETRON HCL 4 MG/2ML IJ SOLN
INTRAMUSCULAR | Status: DC | PRN
  Administered 2023-03-23: 4 mg via INTRAVENOUS

## 2023-03-23 MED ORDER — LACTATED RINGERS IV SOLN: INTRAVENOUS | Status: DC

## 2023-03-23 MED ORDER — CEFAZOLIN SODIUM-DEXTROSE 2-4 GM/100ML-% IV SOLN
INTRAVENOUS | Status: AC
  Filled 2023-03-23: qty 100

## 2023-03-23 MED ORDER — CEFAZOLIN SODIUM-DEXTROSE 2-4 GM/100ML-% IV SOLN
2.0000 g | INTRAVENOUS | Status: AC
  Administered 2023-03-23: 2 g via INTRAVENOUS

## 2023-03-23 MED ORDER — PROPOFOL 10 MG/ML IV BOLUS
INTRAVENOUS | Status: DC | PRN
  Administered 2023-03-23: 180 mg via INTRAVENOUS

## 2023-03-23 MED ORDER — MIDAZOLAM HCL 2 MG/2ML IJ SOLN
INTRAMUSCULAR | Status: DC | PRN
  Administered 2023-03-23: 2 mg via INTRAVENOUS

## 2023-03-23 MED ORDER — FENTANYL CITRATE (PF) 100 MCG/2ML IJ SOLN
INTRAMUSCULAR | Status: AC
  Filled 2023-03-23: qty 2

## 2023-03-23 MED ORDER — ONDANSETRON HCL 4 MG/2ML IJ SOLN
INTRAMUSCULAR | Status: AC
  Filled 2023-03-23: qty 2

## 2023-03-23 MED ORDER — ONABOTULINUMTOXINA 100 UNITS IJ SOLR
100.0000 [IU] | Freq: Once | INTRAMUSCULAR | Status: DC
  Filled 2023-03-23: qty 100

## 2023-03-23 MED ORDER — ORAL CARE MOUTH RINSE: 15.0000 mL | Freq: Once | OROMUCOSAL | Status: DC

## 2023-03-23 MED ORDER — ONABOTULINUMTOXINA 100 UNITS IJ SOLR
INTRAMUSCULAR | Status: DC | PRN
  Administered 2023-03-23: 100 [IU] via INTRAMUSCULAR

## 2023-03-23 MED ORDER — PROPOFOL 10 MG/ML IV BOLUS
INTRAVENOUS | Status: AC
  Filled 2023-03-23: qty 20

## 2023-03-23 MED ORDER — SODIUM CHLORIDE 0.9 % IR SOLN
Status: DC | PRN
Start: 2023-03-23 — End: 2023-03-23
  Administered 2023-03-23: 3000 mL

## 2023-03-23 MED ORDER — SODIUM CHLORIDE (PF) 0.9 % IJ SOLN
INTRAMUSCULAR | Status: AC
  Filled 2023-03-23: qty 20

## 2023-03-23 MED ORDER — MIDAZOLAM HCL 2 MG/2ML IJ SOLN
INTRAMUSCULAR | Status: AC
  Filled 2023-03-23: qty 2

## 2023-03-23 MED ORDER — OXYCODONE HCL 5 MG/5ML PO SOLN: 5.0000 mg | Freq: Once | ORAL | Status: DC | PRN

## 2023-03-23 SURGICAL SUPPLY — 22 items
BAG DRAIN URO TABLE W/ADPT NS (BAG) ×2 IMPLANT
BAG DRN 8 ADPR NS SKTRN CSTL (BAG) ×1
BAG HAMPER (MISCELLANEOUS) ×2 IMPLANT
CLOTH BEACON ORANGE TIMEOUT ST (SAFETY) ×2 IMPLANT
GLOVE BIO SURGEON STRL SZ8 (GLOVE) ×2 IMPLANT
GLOVE BIOGEL PI IND STRL 7.0 (GLOVE) ×4 IMPLANT
GOWN STRL REUS W/TWL LRG LVL3 (GOWN DISPOSABLE) ×2 IMPLANT
GOWN STRL REUS W/TWL XL LVL3 (GOWN DISPOSABLE) ×2 IMPLANT
KIT TURNOVER CYSTO (KITS) ×2 IMPLANT
MANIFOLD NEPTUNE II (INSTRUMENTS) ×2 IMPLANT
NDL ASPIRATION 22 (NEEDLE) ×2 IMPLANT
NDL HYPO 18GX1.5 BLUNT FILL (NEEDLE) ×2 IMPLANT
NEEDLE ASPIRATION 22 (NEEDLE) ×1
NEEDLE HYPO 18GX1.5 BLUNT FILL (NEEDLE) ×1
PACK CYSTO (CUSTOM PROCEDURE TRAY) ×2 IMPLANT
PAD ARMBOARD 7.5X6 YLW CONV (MISCELLANEOUS) ×2 IMPLANT
POSITIONER HEAD 8X9X4 ADT (SOFTGOODS) ×2 IMPLANT
SYR 30ML LL (SYRINGE) ×2 IMPLANT
SYR CONTROL 10ML LL (SYRINGE) ×2 IMPLANT
TOWEL OR 17X26 4PK STRL BLUE (TOWEL DISPOSABLE) ×2 IMPLANT
WATER STERILE IRR 3000ML UROMA (IV SOLUTION) ×2 IMPLANT
WATER STERILE IRR 500ML POUR (IV SOLUTION) ×2 IMPLANT

## 2023-03-23 NOTE — Op Note (Signed)
Preoperative diagnosis: overactive bladder  Postoperative diagnosis: same  Procedure: 1 cystoscopy 2. Intravesical botox injection 100 units  Attending: Wilkie Aye  Anesthesia: General  Estimated blood loss: Minimal  Drains: none  Specimens: none  Antibiotics: ancef  Findings:  Ureteral orifices in normal anatomic location.  No masses/lesions in the bladder  Indications: Patient is a 62 year old female with a history of overactive bladder and urinary leakage refractory to anticholinergic therapy.  After discussing treatment options, they decided proceed with intravesical botox injection.  Procedure her in detail: The patient was brought to the operating room and a brief timeout was done to ensure correct patient, correct procedure, correct site.  General anesthesia was administered patient was placed in dorsal lithotomy position.  Their genitalia was then prepped and draped in usual sterile fashion.  A rigid 22 French cystoscope was passed in the urethra and the bladder.  Bladder was inspected and we noted no masses or lesions.  the ureteral orifices were in the normal orthotopic locations. Using a deflux injection needle we proceed to inject 100 units in a grid pattern between the ureteral orifices starting at the trigone to the mid posterior wall. We injected a total of 20 sites with 5 units per injection. The bladder was then drained and this concluded the procedure which was well tolerated by patient.  Complications: None  Condition: Stable, extubated, transferred to PACU  Plan: Patient is to be discharge home. They will followup in 1 month

## 2023-03-23 NOTE — Transfer of Care (Addendum)
Immediate Anesthesia Transfer of Care Note  Patient: [name]  Procedure(s) Performed: CYSTOSCOPY WITH INJECTION-botox  Patient Location: PACU  Anesthesia Type:General  Level of Consciousness: drowsy and patient cooperative  Airway & Oxygen Therapy: Patient Spontanous Breathing and Patient connected to face mask oxygen  Post-op Assessment: Report given to RN and Post -op Vital signs reviewed and stable  Post vital signs: Reviewed and stable  Last Vitals:  Vitals Value Taken Time  BP 104/52 03/23/23   1225  Temp 36.7 03/23/23   1225  Pulse 67 03/23/23   1225  Resp 15 03/23/23   1225  SpO2 100% 03/23/23   1225    Last Pain:  Vitals:   03/23/23 0936  TempSrc: Oral  PainSc: 0-No pain         Complications: No notable events documented.

## 2023-03-23 NOTE — Anesthesia Procedure Notes (Signed)
Procedure Name: LMA Insertion Date/Time: 03/23/2023 11:56 AM  Performed by: Oletha Cruel, CRNAPre-anesthesia Checklist: Patient identified, Emergency Drugs available, Suction available and Patient being monitored Patient Re-evaluated:Patient Re-evaluated prior to induction Oxygen Delivery Method: Circle system utilized Preoxygenation: Pre-oxygenation with 100% oxygen Induction Type: IV induction Ventilation: Mask ventilation without difficulty LMA: LMA inserted LMA Size: 4.0 Number of attempts: 1 Placement Confirmation: positive ETCO2, CO2 detector and breath sounds checked- equal and bilateral Tube secured with: Tape Dental Injury: Teeth and Oropharynx as per pre-operative assessment  Comments: Atraumatic insertion of LMA size 4. Teeth/lips remain in preoperative condition.

## 2023-03-23 NOTE — Anesthesia Preprocedure Evaluation (Signed)
Anesthesia Evaluation  Patient identified by MRN, date of birth, ID band Patient awake    Reviewed: Allergy & Precautions, H&P , NPO status , Patient's Chart, lab work & pertinent test results, reviewed documented beta blocker date and time   Airway Mallampati: II  TM Distance: >3 FB Neck ROM: full    Dental no notable dental hx.    Pulmonary neg pulmonary ROS, shortness of breath, asthma , former smoker   Pulmonary exam normal breath sounds clear to auscultation       Cardiovascular Exercise Tolerance: Good hypertension, + CAD and +CHF  negative cardio ROS  Rhythm:regular Rate:Normal     Neuro/Psych  PSYCHIATRIC DISORDERS Anxiety Depression Bipolar Disorder    Neuromuscular disease negative neurological ROS  negative psych ROS   GI/Hepatic negative GI ROS, Neg liver ROS,GERD  ,,  Endo/Other  negative endocrine ROSdiabetes    Renal/GU negative Renal ROS  negative genitourinary   Musculoskeletal   Abdominal   Peds  Hematology negative hematology ROS (+)   Anesthesia Other Findings   Reproductive/Obstetrics negative OB ROS                             Anesthesia Physical Anesthesia Plan  ASA: 3  Anesthesia Plan: General and General LMA   Post-op Pain Management:    Induction:   PONV Risk Score and Plan: Ondansetron  Airway Management Planned:   Additional Equipment:   Intra-op Plan:   Post-operative Plan:   Informed Consent: I have reviewed the patients History and Physical, chart, labs and discussed the procedure including the risks, benefits and alternatives for the proposed anesthesia with the patient or authorized representative who has indicated his/her understanding and acceptance.     Dental Advisory Given  Plan Discussed with: CRNA  Anesthesia Plan Comments:        Anesthesia Quick Evaluation

## 2023-03-23 NOTE — H&P (Signed)
History of Present Illness: Ms. [name] is a 62 y.o. female who presents today for follow up visit at [facility]. - GU history: - GU/GYN History: 1. Stress urinary incontinence (resolved s/p MUS). 2. OAB with urinary frequency, urgency, and uge incontinence. - Previously failed trials of Oxybutynin and Vesicare. - Risk factors: Neurogenic (T2DM and spinal stenosis / prior back surgeries), diuretic use, caffeine intake.  3. Lichen sclerosus. 4. Vaginal atrophy. 5. 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 As per Dr. Laverle Hobby note on 09/30/2021. 5. Relevant surgical history includes: - Total abdominal hysterectomy with bilateral salpingo-oophorectomy - Cystocele repair 6. Not sexually active.   At last visit on 12/13/2022: - PVR = 0 ml.  - The plan was:  1. Minimize caffeine intake. 2. Work on timed voiding. 3. Start Myrbetriq 25 mg daily. 4. Maintain good blood sugar control. 5. Return in about 8 weeks.   Today: She denies any symptomatic improvement with Myrbetriq 25 mg daily - continues to experience bothersome urinary frequency, urgency, and urge incontinence. Continues to void 3-5x/day and 2-3x/night with urine leakage approximately 4x/day; wearing pads. She reports that she has reduced her caffeine intake.    She reports mild dysuria today. Denies gross hematuria, straining to void, or sensations of incomplete emptying. Denies fevers, abdominal pain, or flank pain     Fall Screening: Do you usually have a device to assist in your mobility? No    Medications:       Current Outpatient Medications  Medication Sig Dispense Refill   albuterol (VENTOLIN HFA) 108 (90 Base) MCG/ACT inhaler Inhale 1-2 puffs into the lungs every 6 (six) hours as needed for wheezing or shortness of breath.       ARIPiprazole (ABILIFY) 2 MG tablet Take 2 mg by mouth daily.       aspirin EC 81 MG tablet Take 81 mg by mouth daily. Swallow whole.       atorvastatin (LIPITOR) 80 MG tablet  Take 1 tablet (80 mg total) by mouth daily. 90 tablet 3   estradiol (ESTRACE) 0.1 MG/GM vaginal cream Discard plastic applicator. Insert a blueberry size amount (approximately 1 gram) of cream on fingertip inside vagina at bedtime every night for 1 week then 2 nights per week for long term use. 30 g 3   estradiol (ESTRACE) 2 MG tablet TAKE ONE TABLET AT BEDTIME 30 tablet 11   FLUoxetine (PROZAC) 40 MG capsule Take 40 mg by mouth daily.       gabapentin (NEURONTIN) 300 MG capsule Take 1 capsule (300 mg total) by mouth 3 (three) times daily. 21 capsule 3   loratadine (CLARITIN) 10 MG tablet Take 10 mg by mouth daily as needed for allergies.       metFORMIN (GLUCOPHAGE) 500 MG tablet Take 500 mg by mouth daily with breakfast.       metoprolol tartrate (LOPRESSOR) 25 MG tablet Take 1 tablet (25 mg total) by mouth 2 (two) times daily. 180 tablet 3   omeprazole (PRILOSEC) 40 MG capsule Take 1 capsule (40 mg total) by mouth daily. 90 capsule 3   oxyCODONE-acetaminophen (PERCOCET) 7.5-325 MG tablet Take 1 tablet by mouth 4 (four) times daily.       oxymetazoline (AFRIN) 0.05 % nasal spray Place 1 spray into both nostrils 2 (two) times daily as needed for congestion.       potassium chloride SA (KLOR-CON M20) 20 MEQ tablet Take 1 tablet (20 mEq total) by mouth daily. 90 tablet 3   spironolactone (ALDACTONE)  25 MG tablet Take 1 tablet (25 mg total) by mouth daily. 90 tablet 3   torsemide (DEMADEX) 20 MG tablet Take 2 tablets (40 mg total) by mouth daily. 180 tablet 3   traZODone (DESYREL) 100 MG tablet Take 200 mg by mouth at bedtime.       mirabegron ER (MYRBETRIQ) 25 MG TB24 tablet Take 2 tablets (50 mg total) by mouth daily. 60 tablet 11   nitroGLYCERIN (NITROSTAT) 0.4 MG SL tablet Place 1 tablet (0.4 mg total) under the tongue every 5 (five) minutes as needed for chest pain. 25 tablet 3               Current Facility-Administered Medications  Medication Dose Route Frequency Provider Last Rate Last  Admin   bupivacaine-meloxicam ER (ZYNRELEF) injection 400 mg  400 mg Infiltration Once Vickki Hearing, MD       bupivacaine-meloxicam ER (ZYNRELEF) injection 400 mg  400 mg Infiltration Once Vickki Hearing, MD            Allergies: Allergies  No Known Allergies         Past Medical History:  Diagnosis Date   Anxiety     Arthritis     Asthma     Cancer (HCC)      cervical cancer; pt states a long long time ago   CHF (congestive heart failure) (HCC)     Coronary artery disease     Depression     Diabetes mellitus without complication (HCC)     Dyspnea     Edema of all four extremities     Essential (primary) hypertension 06/24/2020   Hyperlipemia     Type II diabetes mellitus (HCC) 03/22/2022             Past Surgical History:  Procedure Laterality Date   ABDOMINAL HYSTERECTOMY       ANTERIOR LAT LUMBAR FUSION N/A 11/05/2019    Procedure: Lumbar Four-Five Anterolateral lumbar interbody fusion with lateral plate;  Surgeon: Barnett Abu, MD;  Location: [facility] OR;  Service: Neurosurgery;  Laterality: N/A;  anterolateral   BACK SURGERY        pins   BACK SURGERY   07/11/2008   BIOPSY   06/29/2021    Procedure: BIOPSY;  Surgeon: Dolores Frame, MD;  Location: [facility];  Service: Gastroenterology;;   BLADDER REPAIR       CARPAL TUNNEL RELEASE Right     CESAREAN SECTION   1982, 1984   COLONOSCOPY N/A 06/12/2013    rehman: normal   CORONARY PRESSURE/FFR STUDY N/A 10/20/2022    Procedure: CORONARY PRESSURE/FFR STUDY;  Surgeon: [name], MD;  Location: [facility];  Service: Cardiovascular;  Laterality: N/A;   ESOPHAGOGASTRODUODENOSCOPY (EGD) WITH PROPOFOL N/A 06/29/2021    Procedure: ESOPHAGOGASTRODUODENOSCOPY (EGD) WITH PROPOFOL;  Surgeon: Dolores Frame, MD;  Location: [facility];  Service: Gastroenterology;  Laterality: N/A;  8:40   KNEE ARTHROSCOPY WITH MEDIAL MENISECTOMY Right 05/09/2019    Procedure: KNEE ARTHROSCOPY  WITH MEDIAL MENISECTOMY AND LATERAL MENISECTOMY;  Surgeon: Vickki Hearing, MD;  Location: [facility];  Service: Orthopedics;  Laterality: Right;   KNEE ARTHROSCOPY WITH MEDIAL MENISECTOMY Left 06/27/2019    Procedure: KNEE ARTHROSCOPY WITH MEDIAL MENISCECTOMY;  Surgeon: Vickki Hearing, MD;  Location: [facility];  Service: Orthopedics;  Laterality: Left;   LEFT HEART CATH AND CORONARY ANGIOGRAPHY N/A 10/20/2022    Procedure: LEFT HEART CATH AND CORONARY ANGIOGRAPHY;  Surgeon: Lynnette Caffey,  Charlies Constable, MD;  Location: [facility];  Service: Cardiovascular;  Laterality: N/A;   TOTAL KNEE ARTHROPLASTY Left 03/15/2022    Procedure: TOTAL KNEE ARTHROPLASTY;  Surgeon: Vickki Hearing, MD;  Location: [facility];  Service: Orthopedics;  Laterality: Left;             Family History  Problem Relation Age of Onset   Cancer Paternal Grandfather     Cancer Paternal Grandmother     Cancer Maternal Grandmother     Heart disease Father     Lung cancer Mother     Other Brother          gunshot   Colon cancer Other          Social History         Socioeconomic History   Marital status: Single      Spouse name: Not on file   Number of children: 2   Years of education: Not on file   Highest education level: Not on file  Occupational History   Occupation: Disabled  Tobacco Use   Smoking status: Former      Current packs/day: 1.00      Average packs/day: 1 pack/day for 20.0 years (20.0 ttl pk-yrs)      Types: Cigarettes   Smokeless tobacco: Never   Tobacco comments:      trying to quit  Vaping Use   Vaping status: Some Days  Substance and Sexual Activity   Alcohol use: No      Alcohol/week: 0.0 standard drinks of alcohol   Drug use: No   Sexual activity: Not Currently      Birth control/protection: Surgical      Comment: hyst  Other Topics Concern   Not on file  Social History Narrative    ** Merged History Encounter **         Social Determinants of Health        Financial  Resource Strain: Low Risk  (10/06/2022)    Overall Financial Resource Strain (CARDIA)     Difficulty of Paying Living Expenses: Not very hard  Food Insecurity: No Food Insecurity (10/06/2022)    Hunger Vital Sign     Worried About Running Out of Food in the Last Year: Never true     Ran Out of Food in the Last Year: Never true  Transportation Needs: No Transportation Needs (10/06/2022)    PRAPARE - Therapist, art (Medical): No     Lack of Transportation (Non-Medical): No  Physical Activity: Inactive (10/06/2022)    Exercise Vital Sign     Days of Exercise per Week: 0 days     Minutes of Exercise per Session: 0 min  Stress: No Stress Concern Present (10/06/2022)    [employer] of Occupational Health - Occupational Stress Questionnaire     Feeling of Stress : Not at all  Social Connections: Socially Isolated (10/06/2022)    Social Connection and Isolation Panel [NHANES]     Frequency of Communication with Friends and Family: Once a week     Frequency of Social Gatherings with Friends and Family: Once a week     Attends Religious Services: Never     Database administrator or Organizations: No     Attends Banker Meetings: Never     Marital Status: Divorced  Catering manager Violence: Not At Risk (10/06/2022)    Humiliation, Afraid, Rape, and Kick questionnaire  Fear of Current or Ex-Partner: No     Emotionally Abused: No     Physically Abused: No     Sexually Abused: No      Review of Systems Constitutional: Patient denies any unintentional weight loss or change in strength lntegumentary: Patient denies any rashes or pruritus Cardiovascular: Patient denies chest pain or syncope Respiratory: Patient denies shortness of breath Gastrointestinal: Patient denies nausea, vomiting, constipation, or diarrhea Musculoskeletal: Patient denies muscle cramps or weakness Neurologic: Patient denies convulsions or seizures Psychiatric: Patient denies  memory problems Allergic/Immunologic: Patient denies recent allergic reaction(s) Hematologic/Lymphatic: Patient denies bleeding tendencies Endocrine: Patient denies heat/cold intolerance   GU: As per HPI.   OBJECTIVE    Vitals:    02/07/23 0851  BP: 112/69  Pulse: 99  Temp: 98.5 F (36.9 C)    There is no height or weight on file to calculate BMI.   Physical Examination  Constitutional: No obvious distress; patient is non-toxic appearing  Cardiovascular: No visible lower extremity edema.  Respiratory: The patient does not have audible wheezing/stridor; respirations do not appear labored  Gastrointestinal: Abdomen non-distended Musculoskeletal: Normal ROM of UEs  Skin: No obvious rashes/open sores  Neurologic: CN 2-12 grossly intact Psychiatric: Answered questions appropriately with normal affect  Hematologic/Lymphatic/Immunologic: No obvious bruises or sites of spontaneous bleeding   UA: positive for >30 WBC/hpf and bacteria (moderate) PVR: 0 ml   ASSESSMENT Urge incontinence - Plan: Urinalysis, Routine w reflex microscopic, BLADDER SCAN AMB NON-IMAGING, mirabegron ER (MYRBETRIQ) 25 MG TB24 tablet, Urine Culture, Ambulatory Referral For Surgery Scheduling   Mixed stress and urge urinary incontinence - Plan: Urinalysis, Routine w reflex microscopic, BLADDER SCAN AMB NON-IMAGING, Urine Culture   Urinary urgency - Plan: Urinalysis, Routine w reflex microscopic, BLADDER SCAN AMB NON-IMAGING, mirabegron ER (MYRBETRIQ) 25 MG TB24 tablet, Urine Culture, Ambulatory Referral For Surgery Scheduling   Mixed incontinence - Plan: Urinalysis, Routine w reflex microscopic, BLADDER SCAN AMB NON-IMAGING, Urine Culture   Exposure of implanted urethral mesh, subsequent encounter - Plan: Urinalysis, Routine w reflex microscopic, BLADDER SCAN AMB NON-IMAGING, Urine Culture   Atrophic vaginitis - Plan: estradiol (ESTRACE) 0.1 MG/GM vaginal cream, Urine Culture   Urinary frequency - Plan:  Ambulatory Referral For Surgery Scheduling   Nocturia - Plan: Ambulatory Referral For Surgery Scheduling   Abnormal urinalysis - Plan: Urine culture   We discussed the symptoms of overactive bladder (OAB), which include urinary urgency, frequency, nocturia, with or without urge incontinence.    While we may not know the exact etiology of OAB, several risk factors can be identified.  - We discussed this patient's risk factors for OAB-type symptoms including T2DM, spinal stenosis / prior back surgeries, diuretic use, caffeine intake.    We discussed the following management options in detail including potential benefits, risks, and side effects: Behavioral therapy: Modify fluid intake Decreasing bladder irritants (such as caffeine, acidic foods, spicy foods, alcohol) Urge suppression strategies Bladder retraining / timed voiding Double voiding Medication(s): Anticholinergic medications Beta-3 adrenergic agonist medications For refractory cases: PTNS (posterior tibial nerve stimulation) Sacral neuromodulation trial (Medtronic lnterStim or Axonics implant) Bladder Botox injections   We agreed to increase Myrbetriq to 50 mg daily and proceed with bladder Botox procedure.   Abnormal urinalysis today; she is somewhat symptomatic although unclear if potential UTI versus vaginal / urethral irritation related to her untreated vaginal atrophy. Will check urine culture and treat as indicated based on results.    The etiology and consequences of urogenital epithelial atrophy was explained  to patient. The thinning of the epithelium of the urethra can contribute to urinary urgency and frequency syndromes. In addition, the normal bacterial flora that colonizes the perineum may contribute to UTI risk because the thin urethral epithelium allows the bacteria to become adherent and the change in vaginal pH can disrupt the vaginal / urethral microbiome and allow for bacterial overgrowth. Patient elected to  start topical vaginal estrogen cream.   All questions were answered.     PLAN Advised the following: 1. Urine culture. 2. Myrbetriq 50 mg daily. 3. Topical vaginal estrogen cream 2 nights/week. 4. Case requested with Dr. Ronne Binning for bladder Botox.           Orders Placed This Encounter  Procedures   Urine Culture   Urine culture   Urinalysis, Routine w reflex microscopic   Ambulatory Referral For Surgery Scheduling      Referral Priority:   Routine      Referral Type:   Consultation      Number of Visits Requested:   1   BLADDER SCAN AMB NON-IMAGING      It has been explained that the patient is to follow regularly with their PCP in addition to all other providers involved in their care and to follow instructions provided by these respective offices. Patient advised to contact urology clinic if any urologic-pertaining questions, concerns, new symptoms or problems arise in the interim period.   Patient Instructions  Overactive bladder (OAB) overview for patients:   Symptoms may include: urinary urgency ("gotta go" feeling) urinary frequency (voiding >8 times per day) night time urination (nocturia) urge incontinence of urine (UUI)   While we do not know the exact etiology of OAB, several treatment options exist including:  Behavioral therapy: Reducing fluid intake Decreasing bladder stimulants (such as caffeine) and irritants (such as acidic food, spicy foods, alcohol) Urge suppression strategies Bladder retraining via timed voiding Pelvic floor physical therapy   Medication(s) - can use one or both of the drug classes below. Anticholinergic / antimuscarinic medications:  Mechanism of action: Activate M3 receptors to reduce detrusor stimulation and increase bladder capacity        (parasympathetic nervous system). Effect: Relaxes the bladder to decrease overactivity, increase bladder storage capacity, and increase time between voids. Onset: Slow acting (may take  8-12 weeks to determine efficacy). Medications include: Vesicare (Solifenacin), Ditropan (Oxybutynin), Detrol (Tolterodine), Toviaz (Fesoterodine), Sanctura (Trospium), Urispas (Flavoxate), Enablex (Darifenacin), Bentyl (Dicyclomine), Levsin (Hyoscyamine ). Potential side effects include but are not limited to: Dry eyes, dry mouth, constipation, cognitive impairment, dementia risk with long term use, and urinary retention/ incomplete bladder emptying. Insurance companies generally prefer for patients to try 1-2 anticholinergic / antimuscarinic medications first due to low cost. Some exceptions are made based on patient-specific comorbidities / risk factors. Beta-3 agonist medications: Mechanism of action: Stimulates selective B3 adrenergic receptors to cause smooth muscle bladder relaxation (sympathetic nervous system). Effect: Relaxes the bladder to decrease overactivity, increase bladder storage capacity, and increase time between voids. Onset: Slow acting (may take 8-12 weeks to determine efficacy). Medications include: Myrbetriq (Mirabegron) and Vibegron Leslye Peer). Potential side effects include but are not limited to: urinary retention / incomplete bladder emptying and elevated blood pressure (more likely to occur in individuals with pre-existing uncontrolled hypertension). These medications tend to be more expensive than the anticholinergic / antimuscarinic medications.     For patients with refractory OAB (if the above treatment options have been unsuccessful): Bladder Botox injections. How it is done: Typically done via  in-office cystoscopy; sometimes done in the OR depending on the situation. The bladder is numbed with lidocaine instilled via a catheter. Then the urologist injects Botox into the bladder muscle wall in about 20 locations. Causes local paralysis of the bladder muscle at the injection sites to reduce bladder muscle overactivity / spasms. The effect lasts for approximately 6  months and cannot be reversed once performed. Risks may included but are not limited to: infection, incomplete bladder emptying/ urinary retention, short term need for self-catheterization or indwelling catheter, and need for repeat therapy. There is a 5-12% chance of needing to catheterize with Botox - that usually resolves in a few months as the Botox wears off. Typically Botox injections would need to be repeated every 3-12 months since this is not a permanent therapy.

## 2023-03-26 NOTE — Anesthesia Postprocedure Evaluation (Signed)
Anesthesia Post Note  Patient: [name]  Procedure(s) Performed: CYSTOSCOPY WITH INJECTION-botox  Patient location during evaluation: Phase II Anesthesia Type: General Level of consciousness: awake Pain management: pain level controlled Vital Signs Assessment: post-procedure vital signs reviewed and stable Respiratory status: spontaneous breathing and respiratory function stable Cardiovascular status: blood pressure returned to baseline and stable Postop Assessment: no headache and no apparent nausea or vomiting Anesthetic complications: no Comments: Late entry   No notable events documented.   Last Vitals:  Vitals:   03/23/23 1250 03/23/23 1307  BP: (!) 105/47 (!) 124/54  Pulse: 79 70  Resp: 20 16  Temp:  36.4 C  SpO2: 93% 95%    Last Pain:  Vitals:   03/24/23 1411  TempSrc:   PainSc: 0-No pain                 Windell Norfolk

## 2023-03-27 ENCOUNTER — Encounter: Payer: Self-pay | Admitting: *Deleted

## 2023-03-27 ENCOUNTER — Ambulatory Visit: Payer: 59 | Admitting: *Deleted

## 2023-03-27 DIAGNOSIS — R600 Localized edema: Secondary | ICD-10-CM

## 2023-03-27 DIAGNOSIS — M25561 Pain in right knee: Secondary | ICD-10-CM | POA: Diagnosis not present

## 2023-03-27 DIAGNOSIS — G8929 Other chronic pain: Secondary | ICD-10-CM

## 2023-03-27 DIAGNOSIS — M25661 Stiffness of right knee, not elsewhere classified: Secondary | ICD-10-CM

## 2023-03-27 NOTE — Therapy (Signed)
OUTPATIENT PHYSICAL THERAPY LOWER EXTREMITY TREATMENT   Patient Name: [name] MRN: [id number] DOB:06/05/1961, 62 y.o., female Today's Date: 03/27/2023  END OF SESSION:  PT End of Session - 03/27/23 0757     Visit Number 6    Number of Visits 12    Date for PT Re-Evaluation 06/06/23    Authorization Type FOTO.    PT Start Time [phone]    PT Stop Time 0857    PT Time Calculation (min) 59 min             Past Medical History:  Diagnosis Date   Anxiety    Arthritis    Asthma    Cancer (HCC)    cervical cancer; pt states a long long time ago   CHF (congestive heart failure) (HCC)    Coronary artery disease    Depression    Diabetes mellitus without complication (HCC)    Dyspnea    Edema of all four extremities    Essential (primary) hypertension 06/24/2020   Hyperlipemia    Type II diabetes mellitus (HCC) 03/22/2022   Past Surgical History:  Procedure Laterality Date   ABDOMINAL HYSTERECTOMY     ANTERIOR LAT LUMBAR FUSION N/A 11/05/2019   Procedure: Lumbar Four-Five Anterolateral lumbar interbody fusion with lateral plate;  Surgeon: Barnett Abu, MD;  Location: [facility];  Service: Neurosurgery;  Laterality: N/A;  anterolateral   BACK SURGERY     pins   BACK SURGERY  07/11/2008   BIOPSY  06/29/2021   Procedure: BIOPSY;  Surgeon: Dolores Frame, MD;  Location: [facility];  Service: Gastroenterology;;   BLADDER REPAIR     CARPAL TUNNEL RELEASE Right    CESAREAN SECTION  1982, 1984   COLONOSCOPY N/A 06/12/2013   rehman: normal   CORONARY PRESSURE/FFR STUDY N/A 10/20/2022   Procedure: CORONARY PRESSURE/FFR STUDY;  Surgeon: [name], MD;  [facility]: [facility];  Service: Cardiovascular;  Laterality: N/A;   ESOPHAGOGASTRODUODENOSCOPY (EGD) WITH PROPOFOL N/A 06/29/2021   Procedure: ESOPHAGOGASTRODUODENOSCOPY (EGD) WITH PROPOFOL;  Surgeon: Dolores Frame, MD;  [facility]: [facility];  Service: Gastroenterology;  Laterality:  N/A;  8:40   KNEE ARTHROSCOPY WITH MEDIAL MENISECTOMY Right 05/09/2019   Procedure: KNEE ARTHROSCOPY WITH MEDIAL MENISECTOMY AND LATERAL MENISECTOMY;  Surgeon: Vickki Hearing, MD;  Location: [facility];  Service: Orthopedics;  Laterality: Right;   KNEE ARTHROSCOPY WITH MEDIAL MENISECTOMY Left 06/27/2019   Procedure: KNEE ARTHROSCOPY WITH MEDIAL MENISCECTOMY;  Surgeon: Vickki Hearing, MD;  Location: [facility];  Service: Orthopedics;  Laterality: Left;   LEFT HEART CATH AND CORONARY ANGIOGRAPHY N/A 10/20/2022   Procedure: LEFT HEART CATH AND CORONARY ANGIOGRAPHY;  Surgeon: [name], MD;  [facility];  Service: Cardiovascular;  Laterality: N/A;   TOTAL KNEE ARTHROPLASTY Left 03/15/2022   Procedure: TOTAL KNEE ARTHROPLASTY;  Surgeon: Vickki Hearing, MD;  Location: [facility];  Service: Orthopedics;  Laterality: Left;   TOTAL KNEE ARTHROPLASTY Right 02/21/2023   Procedure: TOTAL KNEE ARTHROPLASTY;  Surgeon: Vickki Hearing, MD;  Location: [facility];  Service: Orthopedics;  Laterality: Right;   Patient Active Problem List   Diagnosis Date Noted   OAB (overactive bladder) 03/23/2023   S/P total knee replacement, right 02/21/23 03/08/2023   Primary osteoarthritis of right knee 02/21/2023   Lesion of vulva 10/06/2022   Lichen sclerosus et atrophicus 10/06/2022   S/P hysterectomy with oophorectomy 10/06/2022   Hot flashes 10/06/2022   Current use of  estrogen therapy 10/06/2022   Anxiety 03/22/2022   Asthma 03/22/2022   Bipolar 1 disorder (HCC) 03/22/2022   Chronic pain 03/22/2022   Edema 03/22/2022   Gastro-esophageal reflux 03/22/2022   Hyperlipidemia 03/22/2022   Insomnia 03/22/2022   Menopausal symptom 03/22/2022   Sinusitis 03/22/2022   Type II diabetes mellitus (HCC) 03/22/2022   Urinary incontinence 03/22/2022   Osteoarthritis of left knee 03/15/2022   History of operative procedure on lumbosacral spinal structure 02/15/2022   Urge incontinence 09/02/2021    Cervical radiculopathy 12/30/2020   Pseudoarthrosis of lumbar spine 08/25/2020   Essential (primary) hypertension 06/24/2020   Body mass index (BMI) 45.0-49.9, adult (HCC) 05/06/2020   Carpal tunnel syndrome 04/01/2020   Spondylolisthesis, lumbar region 11/22/2019   Localized swelling of both lower legs 11/12/2019   Lumbar stenosis 11/05/2019   S/P left knee arthroscopy 06/27/2019 07/01/2019   Derangement of posterior horn of medial meniscus of left knee    Primary osteoarthritis of left knee    S/P right knee arthroscopy 05/09/19 05/29/2019   Depression 07/24/2014   LOWER LEG, ARTHRITIS, DEGEN./OSTEO 02/18/2009   DERANGEMENT MENISCUS 02/18/2009   ANSERINE BURSITIS, RIGHT 02/18/2009    REFERRING PROVIDER: Fuller Canada MD  REFERRING DIAG: Right total knee replacement  THERAPY DIAG:  Chronic pain of right knee  Stiffness of right knee, not elsewhere classified  Localized edema  Rationale for Evaluation and Treatment: Rehabilitation  ONSET DATE: 02/21/23 (surgery date).  SUBJECTIVE:   SUBJECTIVE STATEMENT: Pt reports pain at 7-8/10 today. Did okay after last Rx. To MD Thursday  PERTINENT HISTORY: Left TKA, CTS, DM. PAIN:  Are you having pain? Yes: NPRS scale: 7-8/10 Pain location: Right knee. Pain description: Throbbing. Aggravating factors: As above. Relieving factors: As above.  PRECAUTIONS: Other: No ultrasound.   WEIGHT BEARING RESTRICTIONS: No  FALLS:  Has patient fallen in last 6 months? No  LIVING ENVIRONMENT: Lives with: lives with their family Lives in: House/apartment Stairs: 3 external steps.  Non-reciprocating. Has following equipment at home: Dan Humphreys - 2 wheeled  OCCUPATION: Disabled.  PLOF: Independent with basic ADLs  PATIENT GOALS: Not have knee pain and  move better.   OBJECTIVE:   PATIENT SURVEYS:  FOTO .  EDEMA:  Circumferential: Right 4 cms > left.  PALPATION: C/o diffuse anterior right knee pain.  Post-op bandage is  intact.  Visible ecchymosis.  ROM:  -15 degrees of right knee extension in supine and -10 degrees passive with flexion to 95 degrees.  LOWER EXTREMITY MMT:  Patient able to perform an antigravity right SLR and SAQ.   GAIT: The patient is walking safely with a FWW with decreases step and streide length.   TODAY'S TREATMENT:                                                                                                                              DATE:  03/27/23:                                   EXERCISE LOG  Exercise Repetitions and Resistance Comments  Nustep Level 3 x 16 mins; seat 8-6   Rockerboard In parallel bars x 5 minutes.   Forward Step Ups 6" box x 20 reps   LAQ's 3# x 25 reps bil   Standing Marches 3x10 reps bil   Standing Hip Abduction 3x10   Seated Hip Adduction 3 mins   Seated Ham Curls Red x 25 reps bil   STS x10 reps   Static Extension Stretch     Modalities  Date:  Vaso: Knee, Low pressure; 34 degrees, 15 mins, Pain and Edema   LOWER EXTREMITY ROM:     Active/Passive  Right Eval(03/08/23) Right(03/17/23)  Knee flexion 95 115/120  Knee extension -15(A)/ -10(P)        PATIENT EDUCATION:  Education details: Discussed HEP. Person educated: Patient Education method: Explanation Education comprehension: verbalized understanding  HOME EXERCISE PROGRAM:   ASSESSMENT:  CLINICAL IMPRESSION:  Pt arrives today reporting getting around at home better with less knee pain. Rx  focused on ROM progression as well as Strengthening and balance for RT LE. She has progressed ROM 8-120 degrees and continues progressing strength, but unable to perform reciprocal pattern for stairs due to defiits.    OBJECTIVE IMPAIRMENTS: Abnormal gait, decreased activity tolerance, decreased ROM, decreased strength, increased edema, and pain.   ACTIVITY LIMITATIONS: carrying, lifting, standing, bathing, and locomotion  level  PARTICIPATION LIMITATIONS: meal prep, cleaning, and laundry  PERSONAL FACTORS: Time since onset of injury/illness/exacerbation are also affecting patient's functional outcome.   REHAB POTENTIAL: Excellent  CLINICAL DECISION MAKING: Stable/uncomplicated  EVALUATION COMPLEXITY: Low   GOALS:  SHORT TERM GOALS: Target date: 03/22/23.  Ind with an initial HEP. Goal status: MET  2.  Full active right knee extension.   9/11: -8 degrees Goal status: IN PROGRESS   LONG TERM GOALS: Target date: 06/06/23  Ind with an advanced HEP.  Goal status: IN PROGRESS  2.  Active right knee flexion to 115 degrees+ so the patient can perform functional tasks and do so with pain not > 2-3/10.  9/11: 122 degrees Goal status: MET  3.  Increase right hip and knee strength to a solid 4+ to 5/5 to provide good stability for accomplishment of functional activities.  Goal status: IN PROGRESS  4.  Perform a reciprocating stair gait with one railing with pain not > 2-3/10.  Goal status: IN PROGRESS  5.  Patient walk safely with a straight cane.  Goal status: MET  PLAN:  PT FREQUENCY: 2x/week  PT DURATION: 6 weeks  PLANNED INTERVENTIONS: Therapeutic exercises, Therapeutic activity, Neuromuscular re-education, Gait training, Patient/Family education, Self Care, Cryotherapy, Moist heat, Vasopneumatic device, and Manual therapy  PLAN FOR NEXT SESSION: Progress per TKA protocol.  LE elevation and vasopneumatic.   Vlada Uriostegui,CHRIS, PTA 03/27/2023, 8:59 AM

## 2023-03-29 ENCOUNTER — Ambulatory Visit: Payer: 59 | Admitting: Physical Therapy

## 2023-03-29 DIAGNOSIS — M25561 Pain in right knee: Secondary | ICD-10-CM | POA: Diagnosis not present

## 2023-03-29 DIAGNOSIS — M25661 Stiffness of right knee, not elsewhere classified: Secondary | ICD-10-CM

## 2023-03-29 DIAGNOSIS — G8929 Other chronic pain: Secondary | ICD-10-CM

## 2023-03-29 DIAGNOSIS — R600 Localized edema: Secondary | ICD-10-CM

## 2023-03-29 NOTE — Therapy (Signed)
OUTPATIENT PHYSICAL THERAPY LOWER EXTREMITY TREATMENT   Patient Name: [name] MRN: [id number] DOB:Aug 10, 1960, 62 y.o., female Today's Date: 03/29/2023  END OF SESSION:  PT End of Session - 03/29/23 0817     Visit Number 7    Number of Visits 12    Authorization Type FOTO.    PT Start Time 0756    PT Stop Time 0846    PT Time Calculation (min) 50 min    Behavior During Therapy [facility] for tasks assessed/performed             Past Medical History:  Diagnosis Date   Anxiety    Arthritis    Asthma    Cancer (HCC)    cervical cancer; pt states a long long time ago   CHF (congestive heart failure) (HCC)    Coronary artery disease    Depression    Diabetes mellitus without complication (HCC)    Dyspnea    Edema of all four extremities    Essential (primary) hypertension 06/24/2020   Hyperlipemia    Type II diabetes mellitus (HCC) 03/22/2022   Past Surgical History:  Procedure Laterality Date   ABDOMINAL HYSTERECTOMY     ANTERIOR LAT LUMBAR FUSION N/A 11/05/2019   Procedure: Lumbar Four-Five Anterolateral lumbar interbody fusion with lateral plate;  Surgeon: Barnett Abu, MD;  [facility]: [facility];  Service: Neurosurgery;  Laterality: N/A;  anterolateral   BACK SURGERY     pins   BACK SURGERY  07/11/2008   BIOPSY  06/29/2021   Procedure: BIOPSY;  Surgeon: Dolores Frame, MD;  [facility];  Service: Gastroenterology;;   BLADDER REPAIR     CARPAL TUNNEL RELEASE Right    CESAREAN SECTION  1982, 1984   COLONOSCOPY N/A 06/12/2013   rehman: normal   CORONARY PRESSURE/FFR STUDY N/A 10/20/2022   Procedure: CORONARY PRESSURE/FFR STUDY;  Surgeon: [name], MD;  [facility];  Service: Cardiovascular;  Laterality: N/A;   CYSTOSCOPY WITH INJECTION N/A 03/23/2023   Procedure: CYSTOSCOPY WITH INJECTION-botox;  Surgeon: Malen Gauze, MD;  Location: [facility];  Service: Urology;  Laterality: N/A;   ESOPHAGOGASTRODUODENOSCOPY  (EGD) WITH PROPOFOL N/A 06/29/2021   Procedure: ESOPHAGOGASTRODUODENOSCOPY (EGD) WITH PROPOFOL;  Surgeon: Dolores Frame, MD;  [facility];  Service: Gastroenterology;  Laterality: N/A;  8:40   KNEE ARTHROSCOPY WITH MEDIAL MENISECTOMY Right 05/09/2019   Procedure: KNEE ARTHROSCOPY WITH MEDIAL MENISECTOMY AND LATERAL MENISECTOMY;  Surgeon: Vickki Hearing, MD;  Location: [facility];  Service: Orthopedics;  Laterality: Right;   KNEE ARTHROSCOPY WITH MEDIAL MENISECTOMY Left 06/27/2019   Procedure: KNEE ARTHROSCOPY WITH MEDIAL MENISCECTOMY;  Surgeon: Vickki Hearing, MD;  Location: [facility];  Service: Orthopedics;  Laterality: Left;   LEFT HEART CATH AND CORONARY ANGIOGRAPHY N/A 10/20/2022   Procedure: LEFT HEART CATH AND CORONARY ANGIOGRAPHY;  Surgeon: [name], MD;  [facility];  Service: Cardiovascular;  Laterality: N/A;   TOTAL KNEE ARTHROPLASTY Left 03/15/2022   Procedure: TOTAL KNEE ARTHROPLASTY;  Surgeon: Vickki Hearing, MD;  Location: [facility];  Service: Orthopedics;  Laterality: Left;   TOTAL KNEE ARTHROPLASTY Right 02/21/2023   Procedure: TOTAL KNEE ARTHROPLASTY;  Surgeon: Vickki Hearing, MD;  Location: [facility];  Service: Orthopedics;  Laterality: Right;   Patient Active Problem List   Diagnosis Date Noted   OAB (overactive bladder) 03/23/2023   S/P total knee replacement, right 02/21/23 03/08/2023   Primary osteoarthritis of right knee  02/21/2023   Lesion of vulva 10/06/2022   Lichen sclerosus et atrophicus 10/06/2022   S/P hysterectomy with oophorectomy 10/06/2022   Hot flashes 10/06/2022   Current use of estrogen therapy 10/06/2022   Anxiety 03/22/2022   Asthma 03/22/2022   Bipolar 1 disorder (HCC) 03/22/2022   Chronic pain 03/22/2022   Edema 03/22/2022   Gastro-esophageal reflux 03/22/2022   Hyperlipidemia 03/22/2022   Insomnia 03/22/2022   Menopausal symptom 03/22/2022   Sinusitis 03/22/2022   Type II diabetes mellitus  (HCC) 03/22/2022   Urinary incontinence 03/22/2022   Osteoarthritis of left knee 03/15/2022   History of operative procedure on lumbosacral spinal structure 02/15/2022   Urge incontinence 09/02/2021   Cervical radiculopathy 12/30/2020   Pseudoarthrosis of lumbar spine 08/25/2020   Essential (primary) hypertension 06/24/2020   Body mass index (BMI) 45.0-49.9, adult (HCC) 05/06/2020   Carpal tunnel syndrome 04/01/2020   Spondylolisthesis, lumbar region 11/22/2019   Localized swelling of both lower legs 11/12/2019   Lumbar stenosis 11/05/2019   S/P left knee arthroscopy 06/27/2019 07/01/2019   Derangement of posterior horn of medial meniscus of left knee    Primary osteoarthritis of left knee    S/P right knee arthroscopy 05/09/19 05/29/2019   Depression 07/24/2014   LOWER LEG, ARTHRITIS, DEGEN./OSTEO 02/18/2009   DERANGEMENT MENISCUS 02/18/2009   ANSERINE BURSITIS, RIGHT 02/18/2009    REFERRING PROVIDER: Fuller Canada MD  REFERRING DIAG: Right total knee replacement  THERAPY DIAG:  Chronic pain of right knee  Stiffness of right knee, not elsewhere classified  Localized edema  Rationale for Evaluation and Treatment: Rehabilitation  ONSET DATE: 02/21/23 (surgery date).  SUBJECTIVE:   SUBJECTIVE STATEMENT:  Pt reports pain at a 7.  PERTINENT HISTORY: Left TKA, CTS, DM. PAIN:  Are you having pain? Yes: NPRS scale: 7/10 Pain location: Right knee. Pain description: Throbbing. Aggravating factors: As above. Relieving factors: As above.  PRECAUTIONS: Other: No ultrasound.   WEIGHT BEARING RESTRICTIONS: No  FALLS:  Has patient fallen in last 6 months? No  LIVING ENVIRONMENT: Lives with: lives with their family Lives in: House/apartment Stairs: 3 external steps.  Non-reciprocating. Has following equipment at home: Dan Humphreys - 2 wheeled  OCCUPATION: Disabled.  PLOF: Independent with basic ADLs  PATIENT GOALS: Not have knee pain and  move  better.   OBJECTIVE:   PATIENT SURVEYS:  FOTO .  EDEMA:  Circumferential: Right 4 cms > left.  PALPATION: C/o diffuse anterior right knee pain.  Post-op bandage is intact.  Visible ecchymosis.  ROM:  -15 degrees of right knee extension in supine and -10 degrees passive with flexion to 95 degrees.  LOWER EXTREMITY MMT:  Patient able to perform an antigravity right SLR and SAQ.   GAIT: The patient is walking safely with a FWW with decreases step and streide length.   TODAY'S TREATMENT:  DATE:      03/29/23:                                     EXERCISE LOG  Exercise Repetitions and Resistance Comments  Nustep  Level 3 x 15 minutes moving seat forward x 2 to increase flexion.   Knee ext 10# x 3 minutes   Ham curls 30# x 3 minutes   Rockerboard in parallel bars 3 minutes       In supine:  4 minute sustained right hamstring stretch to improve extension f/b elevation and vasopneumatic on low x 15 minutes.                                                        03/27/23:                                   EXERCISE LOG  Exercise Repetitions and Resistance Comments  Nustep Level 3 x 16 mins; seat 8-6   Rockerboard In parallel bars x 5 minutes.   Forward Step Ups 6" box x 20 reps   LAQ's 3# x 25 reps bil   Standing Marches 3x10 reps bil   Standing Hip Abduction 3x10   Seated Hip Adduction 3 mins   Seated Ham Curls Red x 25 reps bil   STS x10 reps   Static Extension Stretch     Modalities  Date:  Vaso: Knee, Low pressure; 34 degrees, 15 mins, Pain and Edema   LOWER EXTREMITY ROM:     Active/Passive  Right Eval(03/08/23) Right(03/17/23)  Knee flexion 95 115/120  Knee extension -15(A)/ -10(P)        PATIENT EDUCATION:  Education details: Discussed HEP. Person educated: Patient Education method: Explanation Education comprehension:  verbalized understanding  HOME EXERCISE PROGRAM:   ASSESSMENT:  CLINICAL IMPRESSION:  Patient did very well with the addition of weight machines today (knee ext and ham curls) performing without complaint and excellent technique.  She is still lacking some extension but overall is doing very well and progressing toward goals.     OBJECTIVE IMPAIRMENTS: Abnormal gait, decreased activity tolerance, decreased ROM, decreased strength, increased edema, and pain.   ACTIVITY LIMITATIONS: carrying, lifting, standing, bathing, and locomotion level  PARTICIPATION LIMITATIONS: meal prep, cleaning, and laundry  PERSONAL FACTORS: Time since onset of injury/illness/exacerbation are also affecting patient's functional outcome.   REHAB POTENTIAL: Excellent  CLINICAL DECISION MAKING: Stable/uncomplicated  EVALUATION COMPLEXITY: Low   GOALS:  SHORT TERM GOALS: Target date: 03/22/23.  Ind with an initial HEP. Goal status: MET  2.  Full active right knee extension.   9/11: -8 degrees Goal status: IN PROGRESS   LONG TERM GOALS: Target date: 06/06/23  Ind with an advanced HEP.  Goal status: IN PROGRESS  2.  Active right knee flexion to 115 degrees+ so the patient can perform functional tasks and do so with pain not > 2-3/10.  9/11: 122 degrees Goal status: MET  3.  Increase right hip and knee strength to a solid 4+ to 5/5 to provide good stability for accomplishment of functional activities.  Goal status: IN PROGRESS  4.  Perform a reciprocating stair gait  with one railing with pain not > 2-3/10.  Goal status: IN PROGRESS  5.  Patient walk safely with a straight cane.  Goal status: MET  PLAN:  PT FREQUENCY: 2x/week  PT DURATION: 6 weeks  PLANNED INTERVENTIONS: Therapeutic exercises, Therapeutic activity, Neuromuscular re-education, Gait training, Patient/Family education, Self Care, Cryotherapy, Moist heat, Vasopneumatic device, and Manual therapy  PLAN FOR NEXT SESSION:  Progress per TKA protocol.  LE elevation and vasopneumatic.   Mads Borgmeyer, [place], PT 03/29/2023, 8:50 AM

## 2023-03-30 ENCOUNTER — Ambulatory Visit (INDEPENDENT_AMBULATORY_CARE_PROVIDER_SITE_OTHER): Payer: 59 | Admitting: Orthopedic Surgery

## 2023-03-30 ENCOUNTER — Encounter: Payer: Self-pay | Admitting: Orthopedic Surgery

## 2023-03-30 VITALS — BP 119/73 | HR 105 | Wt 238.0 lb

## 2023-03-30 DIAGNOSIS — Z96651 Presence of right artificial knee joint: Secondary | ICD-10-CM

## 2023-03-30 DIAGNOSIS — I509 Heart failure, unspecified: Secondary | ICD-10-CM

## 2023-03-30 MED ORDER — FUROSEMIDE 20 MG PO TABS
20.0000 mg | ORAL_TABLET | Freq: Two times a day (BID) | ORAL | 0 refills | Status: DC
Start: 2023-03-30 — End: 2023-05-09

## 2023-03-30 NOTE — Patient Instructions (Signed)
Call cardiology and primary care about your legs swelling and shortness of breath, today, now when you get home

## 2023-03-30 NOTE — Progress Notes (Signed)
Chief Complaint  Patient presents with   Post-op Follow-up    02/21/23 right knee replacement/ states legs swollen    [name] is approximately 1 month postop from a right total knee she complains of bilateral leg edema shortness of breath she has a history of CHF on Lasix which was stopped after surgery because she ran out of the medicine.  She called the pharmacy they would not refill it so she did not take it  Her weight is up 18 pounds  Her exam shows bilateral leg edema right greater than left.  Her knee incision looks good she is doing well with her right knee replacement  She is followed by Dr. Algis Downs who has given her oxycodone 120 tablets to last 30 days that ends approximately September 23 she will see him tomorrow  I have restarted her Lasix I told her to check her weight call her primary care doctor  As far as her knee goes she should see me in 4 weeks and continue physical therapy  Encounter Diagnoses  Name Primary?   S/P total knee replacement, right 02/21/23    Acute on chronic heart failure, unspecified heart failure type (HCC) Yes   Meds ordered this encounter  Medications   furosemide (LASIX) 20 MG tablet    Sig: Take 1 tablet (20 mg total) by mouth 2 (two) times daily.    Dispense:  60 tablet    Refill:  0

## 2023-03-30 NOTE — Progress Notes (Signed)
Name: [name] DOB: Jan 10, 1961 MRN: [id number]  Diagnoses: Post-operative state  HPI: ADANYA KITTRELL presents post-operatively. - GU/GYN History: 1. Stress urinary incontinence (resolved s/p MUS). 2. OAB with urinary frequency, nocturia, urgency, and uge incontinence. - Refractory to medications including Oxybutynin, Vesicare, and Myrbetriq.  - Risk factors: Neurogenic (T2DM and spinal stenosis / prior back surgeries), diuretic use, caffeine intake.  3. Lichen sclerosus. 4. Vaginal atrophy. Using topical vaginal estrogen cream 1x/week. 5. 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 As per Dr. Laverle Hobby note on 09/30/2021. 6. Not sexually active.  Today She presents s/p the following procedures by Dr. Ronne Binning on 03/23/2023:  Preoperative diagnosis: overactive bladder   Postoperative diagnosis: same   Procedure:  1. cystoscopy 2. Intravesical botox injection 100 units   Postop course: She reports significant symptomatic improvement since the Botox procedure - she is now only waking up 1x/night to void and reports she is having much fewer leakage episodes with more time to make it to the bathroom (reduced urgency). She continues to take Myrbetriq 50 mg daily.  She denies dysuria, gross hematuria, straining to void, or sensations of incomplete emptying.   Fall Screening: Do you usually have a device to assist in your mobility? No   Medications: Current Outpatient Medications  Medication Sig Dispense Refill   albuterol (VENTOLIN HFA) 108 (90 Base) MCG/ACT inhaler Inhale 1-2 puffs into the lungs every 6 (six) hours as needed for wheezing or shortness of breath.     ARIPiprazole (ABILIFY) 2 MG tablet Take 2 mg by mouth daily.     aspirin 81 MG chewable tablet Chew 1 tablet (81 mg total) by mouth 2 (two) times daily. 90 tablet 0   atorvastatin (LIPITOR) 80 MG tablet Take 1 tablet (80 mg total) by mouth daily. (Patient taking differently: Take 40 mg by mouth at bedtime.) 90  tablet 3   bisacodyl (DULCOLAX) 5 MG EC tablet Take 1 tablet (5 mg total) by mouth daily as needed for moderate constipation. 30 tablet 0   celecoxib (CELEBREX) 100 MG capsule Take 1 capsule (100 mg total) by mouth 2 (two) times daily. 60 capsule 2   ELIQUIS 2.5 MG TABS tablet TAKE ONE TABLET BY MOUTH TWICE DAILY 60 tablet 0   Empagliflozin-metFORMIN HCl ER (SYNJARDY XR) 12.11-998 MG TB24 Take 1 tablet by mouth daily.     estradiol (ESTRACE) 0.1 MG/GM vaginal cream Discard plastic applicator. Insert a blueberry size amount (approximately 1 gram) of cream on fingertip inside vagina at bedtime every night for 1 week then 2 nights per week for long term use. 30 g 3   estradiol (ESTRACE) 2 MG tablet TAKE ONE TABLET AT BEDTIME 30 tablet 11   FLUoxetine (PROZAC) 40 MG capsule Take 40 mg by mouth daily.     furosemide (LASIX) 20 MG tablet Take 1 tablet (20 mg total) by mouth 2 (two) times daily. 60 tablet 0   gabapentin (NEURONTIN) 300 MG capsule Take 1 capsule (300 mg total) by mouth 3 (three) times daily. 21 capsule 3   loratadine (CLARITIN) 10 MG tablet Take 10 mg by mouth daily.     methocarbamol (ROBAXIN) 500 MG tablet Take 1 tablet (500 mg total) by mouth every 6 (six) hours as needed for muscle spasms. 56 tablet 2   metoprolol tartrate (LOPRESSOR) 25 MG tablet Take 1 tablet (25 mg total) by mouth 2 (two) times daily. 180 tablet 3   mirabegron ER (MYRBETRIQ) 25 MG TB24 tablet Take 2 tablets (50 mg total) by  mouth daily. 60 tablet 11   omeprazole (PRILOSEC) 40 MG capsule Take 1 capsule (40 mg total) by mouth daily. 90 capsule 3   oxyCODONE-acetaminophen (PERCOCET) 7.5-325 MG tablet Take 1 tablet by mouth every 4 (four) hours as needed for severe pain.     oxymetazoline (AFRIN) 0.05 % nasal spray Place 1 spray into both nostrils 2 (two) times daily as needed for congestion.     torsemide (DEMADEX) 20 MG tablet Take 2 tablets (40 mg total) by mouth daily. 180 tablet 3   traMADol (ULTRAM) 50 MG tablet  Take 1 tablet (50 mg total) by mouth every 6 (six) hours as needed. 5 tablet 0   traZODone (DESYREL) 100 MG tablet Take 200 mg by mouth at bedtime.     nitroGLYCERIN (NITROSTAT) 0.4 MG SL tablet Place 1 tablet (0.4 mg total) under the tongue every 5 (five) minutes as needed for chest pain. 25 tablet 3   potassium chloride SA (KLOR-CON M20) 20 MEQ tablet Take 1 tablet (20 mEq total) by mouth daily. 90 tablet 3   spironolactone (ALDACTONE) 25 MG tablet Take 1 tablet (25 mg total) by mouth daily. 90 tablet 3   No current facility-administered medications for this visit.    Allergies: No Known Allergies  Past Medical History:  Diagnosis Date   Anxiety    Arthritis    Asthma    Cancer (HCC)    cervical cancer; pt states a long long time ago   CHF (congestive heart failure) (HCC)    Coronary artery disease    Depression    Diabetes mellitus without complication (HCC)    Dyspnea    Edema of all four extremities    Essential (primary) hypertension 06/24/2020   Hyperlipemia    Type II diabetes mellitus (HCC) 03/22/2022   Past Surgical History:  Procedure Laterality Date   ABDOMINAL HYSTERECTOMY     ANTERIOR LAT LUMBAR FUSION N/A 11/05/2019   Procedure: Lumbar Four-Five Anterolateral lumbar interbody fusion with lateral plate;  Surgeon: Barnett Abu, MD;  Location: [facility];  Service: Neurosurgery;  Laterality: N/A;  anterolateral   BACK SURGERY     pins   BACK SURGERY  07/11/2008   BIOPSY  06/29/2021   Procedure: BIOPSY;  Surgeon: Dolores Frame, MD;  Location: [facility];  Service: Gastroenterology;;   BLADDER REPAIR     CARPAL TUNNEL RELEASE Right    CESAREAN SECTION  1982, 1984   COLONOSCOPY N/A 06/12/2013   rehman: normal   CORONARY PRESSURE/FFR STUDY N/A 10/20/2022   Procedure: CORONARY PRESSURE/FFR STUDY;  Surgeon: [name], MD;  Location: [facility];  Service: Cardiovascular;  Laterality: N/A;   CYSTOSCOPY WITH INJECTION N/A 03/23/2023    Procedure: CYSTOSCOPY WITH INJECTION-botox;  Surgeon: Malen Gauze, MD;  Location: [facility];  Service: Urology;  Laterality: N/A;   ESOPHAGOGASTRODUODENOSCOPY (EGD) WITH PROPOFOL N/A 06/29/2021   Procedure: ESOPHAGOGASTRODUODENOSCOPY (EGD) WITH PROPOFOL;  Surgeon: Dolores Frame, MD;  [facility]: [facility];  Service: Gastroenterology;  Laterality: N/A;  8:40   KNEE ARTHROSCOPY WITH MEDIAL MENISECTOMY Right 05/09/2019   Procedure: KNEE ARTHROSCOPY WITH MEDIAL MENISECTOMY AND LATERAL MENISECTOMY;  Surgeon: Vickki Hearing, MD;  Location: [facility];  Service: Orthopedics;  Laterality: Right;   KNEE ARTHROSCOPY WITH MEDIAL MENISECTOMY Left 06/27/2019   Procedure: KNEE ARTHROSCOPY WITH MEDIAL MENISCECTOMY;  Surgeon: Vickki Hearing, MD;  Location: [facility];  Service: Orthopedics;  Laterality: Left;   LEFT HEART CATH AND CORONARY ANGIOGRAPHY N/A  10/20/2022   Procedure: LEFT HEART CATH AND CORONARY ANGIOGRAPHY;  Surgeon: [name], MD;  [facility]: [facility];  Service: Cardiovascular;  Laterality: N/A;   TOTAL KNEE ARTHROPLASTY Left 03/15/2022   Procedure: TOTAL KNEE ARTHROPLASTY;  Surgeon: Vickki Hearing, MD;  Location: [facility];  Service: Orthopedics;  Laterality: Left;   TOTAL KNEE ARTHROPLASTY Right 02/21/2023   Procedure: TOTAL KNEE ARTHROPLASTY;  Surgeon: Vickki Hearing, MD;  Location: [facility];  Service: Orthopedics;  Laterality: Right;   Family History  Problem Relation Age of Onset   Cancer Paternal Grandfather    Cancer Paternal Grandmother    Cancer Maternal Grandmother    Heart disease Father    Lung cancer Mother    Other Brother        gunshot   Colon cancer Other    Social History   Socioeconomic History   Marital status: Single    Spouse name: Not on file   Number of children: 2   Years of education: Not on file   Highest education level: Not on file  Occupational History   Occupation: Disabled  Tobacco Use   Smoking status: Former     Current packs/day: 1.00    Average packs/day: 1 pack/day for 20.0 years (20.0 ttl pk-yrs)    Types: Cigarettes   Smokeless tobacco: Never   Tobacco comments:    trying to quit  Vaping Use   Vaping status: Some Days  Substance and Sexual Activity   Alcohol use: No    Alcohol/week: 0.0 standard drinks of alcohol   Drug use: No   Sexual activity: Not Currently    Birth control/protection: Surgical    Comment: hyst  Other Topics Concern   Not on file  Social History Narrative   ** Merged History Encounter **       Social Determinants of Health   Financial Resource Strain: Low Risk  (10/06/2022)   Overall Financial Resource Strain (CARDIA)    Difficulty of Paying Living Expenses: Not very hard  Food Insecurity: No Food Insecurity (02/21/2023)   Hunger Vital Sign    Worried About Running Out of Food in the Last Year: Never true    Ran Out of Food in the Last Year: Never true  Transportation Needs: No Transportation Needs (02/21/2023)   PRAPARE - Administrator, Civil Service (Medical): No    Lack of Transportation (Non-Medical): No  Physical Activity: Inactive (10/06/2022)   Exercise Vital Sign    Days of Exercise per Week: 0 days    Minutes of Exercise per Session: 0 min  Stress: No Stress Concern Present (10/06/2022)   [employer] of Occupational Health - Occupational Stress Questionnaire    Feeling of Stress : Not at all  Social Connections: Socially Isolated (10/06/2022)   Social Connection and Isolation Panel [NHANES]    Frequency of Communication with Friends and Family: Once a week    Frequency of Social Gatherings with Friends and Family: Once a week    Attends Religious Services: Never    Database administrator or Organizations: No    Attends Banker Meetings: Never    Marital Status: Divorced  Catering manager Violence: Not At Risk (02/21/2023)   Humiliation, Afraid, Rape, and Kick questionnaire    Fear of Current or Ex-Partner: No     Emotionally Abused: No    Physically Abused: No    Sexually Abused: No    SUBJECTIVE  Review of Systems  Constitutional: Patient denies any unintentional weight loss or change in strength lntegumentary: Patient denies any rashes or pruritus Cardiovascular: Patient denies chest pain or syncope Respiratory: Patient denies shortness of breath Gastrointestinal: Patient denies nausea, vomiting, constipation, or diarrhea Musculoskeletal: Patient denies muscle cramps or weakness Neurologic: Patient denies convulsions or seizures Psychiatric: Patient denies memory problems Allergic/Immunologic: Patient denies recent allergic reaction(s) Hematologic/Lymphatic: Patient denies bleeding tendencies Endocrine: Patient denies heat/cold intolerance  GU: As per HPI.  OBJECTIVE Vitals:   04/05/23 0849  BP: 106/70  Pulse: 67  Temp: 97.8 F (36.6 C)   There is no height or weight on file to calculate BMI.  Physical Examination Constitutional: No obvious distress; patient is non-toxic appearing  Cardiovascular: No visible lower extremity edema.  Respiratory: The patient does not have audible wheezing/stridor; respirations do not appear labored  Gastrointestinal: Abdomen non-distended Musculoskeletal: Normal ROM of UEs  Skin: No obvious rashes/open sores  Neurologic: CN 2-12 grossly intact Psychiatric: Answered questions appropriately with normal affect  Hematologic/Lymphatic/Immunologic: No obvious bruises or sites of spontaneous bleeding  UA: 6-10 WBC/hpf, 0-2 RBC/hpf, bacteria (moderate) PVR: 0 ml  ASSESSMENT OAB (overactive bladder) - Plan: Urinalysis, Routine w reflex microscopic, BLADDER SCAN AMB NON-IMAGING  Urge incontinence - Plan: Urinalysis, Routine w reflex microscopic, BLADDER SCAN AMB NON-IMAGING  Postop check - Plan: Urinalysis, Routine w reflex microscopic, BLADDER SCAN AMB NON-IMAGING  We reviewed the operative procedures and findings. Pre-operative symptoms are  improved since the procedure.   OK to continue Myrbetriq 50 mg daily. Advised to continue working on minimizing caffeine intake and working on timed voiding / bladder retraining.   Advised to continue using topical vaginal estrogen cream - advised to increase use to 2x/week for vaginal atrophy.  Will plan for follow up in 5 months or sooner if needed. Pt verbalized understanding and agreement. All questions were answered.  PLAN Advised the following: Continue Myrbetriq 50 mg daily. Continue using topical vaginal estrogen cream - 2x/week  Return in about 5 months (around 09/05/2023) for UA, PVR, & f/u with Evette Georges NP.   Orders Placed This Encounter  Procedures   Urinalysis, Routine w reflex microscopic   BLADDER SCAN AMB NON-IMAGING   It has been explained that the patient is to follow regularly with their PCP in addition to all other providers involved in their care and to follow instructions provided by these respective offices. Patient advised to contact urology clinic if any urologic-pertaining questions, concerns, new symptoms or problems arise in the interim period.  Patient Instructions      Electronically signed by:  Donnita Falls, MSN, FNP-C, CUNP 04/05/2023 9:09 AM

## 2023-04-03 ENCOUNTER — Ambulatory Visit: Payer: 59 | Admitting: *Deleted

## 2023-04-03 ENCOUNTER — Other Ambulatory Visit: Payer: Self-pay | Admitting: Orthopedic Surgery

## 2023-04-03 ENCOUNTER — Encounter: Payer: Self-pay | Admitting: *Deleted

## 2023-04-03 DIAGNOSIS — G8929 Other chronic pain: Secondary | ICD-10-CM

## 2023-04-03 DIAGNOSIS — M25661 Stiffness of right knee, not elsewhere classified: Secondary | ICD-10-CM

## 2023-04-03 DIAGNOSIS — M25561 Pain in right knee: Secondary | ICD-10-CM | POA: Diagnosis not present

## 2023-04-03 DIAGNOSIS — R600 Localized edema: Secondary | ICD-10-CM

## 2023-04-03 DIAGNOSIS — Z96651 Presence of right artificial knee joint: Secondary | ICD-10-CM

## 2023-04-03 NOTE — Therapy (Signed)
OUTPATIENT PHYSICAL THERAPY LOWER EXTREMITY TREATMENT   Patient Name: [name] MRN: [id number] DOB:10/11/1960, 62 y.o., female Today's Date: 04/03/2023  END OF SESSION:  PT End of Session - 04/03/23 0755     Visit Number 8    Number of Visits 12    Date for PT Re-Evaluation 06/06/23    Authorization Type FOTO.    PT Start Time 0800    PT Stop Time 0856    PT Time Calculation (min) 56 min             Past Medical History:  Diagnosis Date   Anxiety    Arthritis    Asthma    Cancer (HCC)    cervical cancer; pt states a long long time ago   CHF (congestive heart failure) (HCC)    Coronary artery disease    Depression    Diabetes mellitus without complication (HCC)    Dyspnea    Edema of all four extremities    Essential (primary) hypertension 06/24/2020   Hyperlipemia    Type II diabetes mellitus (HCC) 03/22/2022   Past Surgical History:  Procedure Laterality Date   ABDOMINAL HYSTERECTOMY     ANTERIOR LAT LUMBAR FUSION N/A 11/05/2019   Procedure: Lumbar Four-Five Anterolateral lumbar interbody fusion with lateral plate;  Surgeon: Barnett Abu, MD;  Location: [facility];  Service: Neurosurgery;  Laterality: N/A;  anterolateral   BACK SURGERY     pins   BACK SURGERY  07/11/2008   BIOPSY  06/29/2021   Procedure: BIOPSY;  Surgeon: Dolores Frame, MD;  [facility];  Service: Gastroenterology;;   BLADDER REPAIR     CARPAL TUNNEL RELEASE Right    CESAREAN SECTION  1982, 1984   COLONOSCOPY N/A 06/12/2013   rehman: normal   CORONARY PRESSURE/FFR STUDY N/A 10/20/2022   Procedure: CORONARY PRESSURE/FFR STUDY;  Surgeon: [name], MD;  [facility];  Service: Cardiovascular;  Laterality: N/A;   CYSTOSCOPY WITH INJECTION N/A 03/23/2023   Procedure: CYSTOSCOPY WITH INJECTION-botox;  Surgeon: Malen Gauze, MD;  Location: [facility];  Service: Urology;  Laterality: N/A;   ESOPHAGOGASTRODUODENOSCOPY (EGD) WITH PROPOFOL N/A  06/29/2021   Procedure: ESOPHAGOGASTRODUODENOSCOPY (EGD) WITH PROPOFOL;  Surgeon: Dolores Frame, MD;  [facility];  Service: Gastroenterology;  Laterality: N/A;  8:40   KNEE ARTHROSCOPY WITH MEDIAL MENISECTOMY Right 05/09/2019   Procedure: KNEE ARTHROSCOPY WITH MEDIAL MENISECTOMY AND LATERAL MENISECTOMY;  Surgeon: Vickki Hearing, MD;  Location: [facility];  Service: Orthopedics;  Laterality: Right;   KNEE ARTHROSCOPY WITH MEDIAL MENISECTOMY Left 06/27/2019   Procedure: KNEE ARTHROSCOPY WITH MEDIAL MENISCECTOMY;  Surgeon: Vickki Hearing, MD;  Location: [facility];  Service: Orthopedics;  Laterality: Left;   LEFT HEART CATH AND CORONARY ANGIOGRAPHY N/A 10/20/2022   Procedure: LEFT HEART CATH AND CORONARY ANGIOGRAPHY;  Surgeon: [name], MD;  [facility];  Service: Cardiovascular;  Laterality: N/A;   TOTAL KNEE ARTHROPLASTY Left 03/15/2022   Procedure: TOTAL KNEE ARTHROPLASTY;  Surgeon: Vickki Hearing, MD;  Location: [facility];  Service: Orthopedics;  Laterality: Left;   TOTAL KNEE ARTHROPLASTY Right 02/21/2023   Procedure: TOTAL KNEE ARTHROPLASTY;  Surgeon: Vickki Hearing, MD;  Location: [facility];  Service: Orthopedics;  Laterality: Right;   Patient Active Problem List   Diagnosis Date Noted   OAB (overactive bladder) 03/23/2023   S/P total knee replacement, right 02/21/23 03/08/2023   Primary osteoarthritis of right knee 02/21/2023  Lesion of vulva 10/06/2022   Lichen sclerosus et atrophicus 10/06/2022   S/P hysterectomy with oophorectomy 10/06/2022   Hot flashes 10/06/2022   Current use of estrogen therapy 10/06/2022   Anxiety 03/22/2022   Asthma 03/22/2022   Bipolar 1 disorder (HCC) 03/22/2022   Chronic pain 03/22/2022   Edema 03/22/2022   Gastro-esophageal reflux 03/22/2022   Hyperlipidemia 03/22/2022   Insomnia 03/22/2022   Menopausal symptom 03/22/2022   Sinusitis 03/22/2022   Type II diabetes mellitus (HCC) 03/22/2022    Osteoarthritis of left knee 03/15/2022   History of operative procedure on lumbosacral spinal structure 02/15/2022   Urge incontinence 09/02/2021   Cervical radiculopathy 12/30/2020   Pseudoarthrosis of lumbar spine 08/25/2020   Essential (primary) hypertension 06/24/2020   Body mass index (BMI) 45.0-49.9, adult (HCC) 05/06/2020   Carpal tunnel syndrome 04/01/2020   Spondylolisthesis, lumbar region 11/22/2019   Localized swelling of both lower legs 11/12/2019   Lumbar stenosis 11/05/2019   S/P left knee arthroscopy 06/27/2019 07/01/2019   Derangement of posterior horn of medial meniscus of left knee    Primary osteoarthritis of left knee    S/P right knee arthroscopy 05/09/19 05/29/2019   Depression 07/24/2014   LOWER LEG, ARTHRITIS, DEGEN./OSTEO 02/18/2009   DERANGEMENT MENISCUS 02/18/2009   ANSERINE BURSITIS, RIGHT 02/18/2009    REFERRING PROVIDER: Fuller Canada MD  REFERRING DIAG: Right total knee replacement  THERAPY DIAG:  Chronic pain of right knee  Stiffness of right knee, not elsewhere classified  Localized edema  Rationale for Evaluation and Treatment: Rehabilitation  ONSET DATE: 02/21/23 (surgery date).  SUBJECTIVE:   SUBJECTIVE STATEMENT:  Pt reports pain at a  7/10 RT knee PERTINENT HISTORY: Left TKA, CTS, DM. PAIN:  Are you having pain? Yes: NPRS scale: 7/10 Pain location: Right knee. Pain description: Throbbing. Aggravating factors: As above. Relieving factors: As above.  PRECAUTIONS: Other: No ultrasound.   WEIGHT BEARING RESTRICTIONS: No  FALLS:  Has patient fallen in last 6 months? No  LIVING ENVIRONMENT: Lives with: lives with their family Lives in: House/apartment Stairs: 3 external steps.  Non-reciprocating. Has following equipment at home: Dan Humphreys - 2 wheeled  OCCUPATION: Disabled.  PLOF: Independent with basic ADLs  PATIENT GOALS: Not have knee pain and  move better.   OBJECTIVE:   PATIENT SURVEYS:  FOTO .  EDEMA:   Circumferential: Right 4 cms > left.  PALPATION: C/o diffuse anterior right knee pain.  Post-op bandage is intact.  Visible ecchymosis.  ROM:  -15 degrees of right knee extension in supine and -10 degrees passive with flexion to 95 degrees.  LOWER EXTREMITY MMT:  Patient able to perform an antigravity right SLR and SAQ.   GAIT: The patient is walking safely with a FWW with decreases step and streide length.   TODAY'S TREATMENT:  DATE:      04/03/23:                                     EXERCISE LOG        RT TKR  Exercise Repetitions and Resistance Comments  Nustep  Level 3 x 15 minutes moving seat forward x 2 to increase flexion.   Knee ext 10# x 3 minutes   Ham curls 30# x 3 minutes   Rockerboard in parallel bars 5 minutes   PF/DF and balance   LLLDS   extension X3 mins 3# wt   In supine:  Manual PROM right knee extension stretch with contract/ relax with quad sets  vasopneumatic on low x 15 minutes.                                                        03/27/23:                                   EXERCISE LOG  Exercise Repetitions and Resistance Comments  Nustep Level 3 x 16 mins; seat 8-6   Rockerboard In parallel bars x 5 minutes.   Forward Step Ups 6" box x 20 reps   LAQ's 3# x 25 reps bil   Standing Marches 3x10 reps bil   Standing Hip Abduction 3x10   Seated Hip Adduction 3 mins   Seated Ham Curls Red x 25 reps bil   STS x10 reps   Static Extension Stretch     Modalities  Date:  Vaso: Knee, Low pressure; 34 degrees, 15 mins, Pain and Edema   LOWER EXTREMITY ROM:     Active/Passive  Right Eval(03/08/23) Right(03/17/23)  Knee flexion 95 115/120  Knee extension -15(A)/ -10(P)        PATIENT EDUCATION:  Education details: Discussed HEP. Person educated: Patient Education method: Explanation Education comprehension: verbalized  understanding  HOME EXERCISE PROGRAM:   ASSESSMENT:  CLINICAL IMPRESSION:  Patient arrived today doing fairly well with RT knee. Rx focused on ROM progression as well as strengthening and balance. Pt did well with LLLPS  and contract / relax for ext.      OBJECTIVE IMPAIRMENTS: Abnormal gait, decreased activity tolerance, decreased ROM, decreased strength, increased edema, and pain.   ACTIVITY LIMITATIONS: carrying, lifting, standing, bathing, and locomotion level  PARTICIPATION LIMITATIONS: meal prep, cleaning, and laundry  PERSONAL FACTORS: Time since onset of injury/illness/exacerbation are also affecting patient's functional outcome.   REHAB POTENTIAL: Excellent  CLINICAL DECISION MAKING: Stable/uncomplicated  EVALUATION COMPLEXITY: Low   GOALS:  SHORT TERM GOALS: Target date: 03/22/23.  Ind with an initial HEP. Goal status: MET  2.  Full active right knee extension.   9/11: -8 degrees Goal status: IN PROGRESS   LONG TERM GOALS: Target date: 06/06/23  Ind with an advanced HEP.  Goal status: IN PROGRESS  2.  Active right knee flexion to 115 degrees+ so the patient can perform functional tasks and do so with pain not > 2-3/10.  9/11: 122 degrees Goal status: MET  3.  Increase right hip and knee strength to a solid 4+ to 5/5 to provide good stability for accomplishment of  functional activities.  Goal status: IN PROGRESS  4.  Perform a reciprocating stair gait with one railing with pain not > 2-3/10.  Goal status: IN PROGRESS  5.  Patient walk safely with a straight cane.  Goal status: MET  PLAN:  PT FREQUENCY: 2x/week  PT DURATION: 6 weeks  PLANNED INTERVENTIONS: Therapeutic exercises, Therapeutic activity, Neuromuscular re-education, Gait training, Patient/Family education, Self Care, Cryotherapy, Moist heat, Vasopneumatic device, and Manual therapy  PLAN FOR NEXT SESSION: Progress per TKA protocol.  LE elevation and  vasopneumatic.   Jamond Neels,CHRIS, PTA 04/03/2023, 9:11 AM

## 2023-04-05 ENCOUNTER — Encounter: Payer: Self-pay | Admitting: Urology

## 2023-04-05 ENCOUNTER — Ambulatory Visit: Payer: 59 | Admitting: Urology

## 2023-04-05 VITALS — BP 106/70 | HR 67 | Temp 97.8°F

## 2023-04-05 DIAGNOSIS — N3941 Urge incontinence: Secondary | ICD-10-CM | POA: Diagnosis not present

## 2023-04-05 DIAGNOSIS — N3281 Overactive bladder: Secondary | ICD-10-CM

## 2023-04-05 DIAGNOSIS — Z09 Encounter for follow-up examination after completed treatment for conditions other than malignant neoplasm: Secondary | ICD-10-CM

## 2023-04-05 LAB — MICROSCOPIC EXAMINATION

## 2023-04-05 LAB — URINALYSIS, ROUTINE W REFLEX MICROSCOPIC
Bilirubin, UA: NEGATIVE
Ketones, UA: NEGATIVE
Nitrite, UA: NEGATIVE
Protein,UA: NEGATIVE
RBC, UA: NEGATIVE
Specific Gravity, UA: <1.005 — ABNORMAL LOW (ref 1.005–1.030)
Urobilinogen, Ur: 0.2 mg/dL (ref 0.2–1.0)
pH, UA: 6 (ref 5.0–7.5)

## 2023-04-05 LAB — BLADDER SCAN AMB NON-IMAGING: Scan Result: 0

## 2023-04-06 ENCOUNTER — Ambulatory Visit: Payer: 59 | Admitting: Physical Therapy

## 2023-04-06 DIAGNOSIS — M25561 Pain in right knee: Secondary | ICD-10-CM | POA: Diagnosis not present

## 2023-04-06 DIAGNOSIS — R600 Localized edema: Secondary | ICD-10-CM

## 2023-04-06 DIAGNOSIS — G8929 Other chronic pain: Secondary | ICD-10-CM

## 2023-04-06 DIAGNOSIS — M25661 Stiffness of right knee, not elsewhere classified: Secondary | ICD-10-CM

## 2023-04-06 NOTE — Therapy (Signed)
OUTPATIENT PHYSICAL THERAPY LOWER EXTREMITY TREATMENT   Patient Name: [name] MRN: [id number] DOB:1961/03/13, 62 y.o., female Today's Date: 04/06/2023  END OF SESSION:  PT End of Session - 04/06/23 0808     Visit Number 9    Number of Visits 12    Date for PT Re-Evaluation 06/06/23    Authorization Type FOTO.    PT Start Time 0755    PT Stop Time 0850    PT Time Calculation (min) 55 min    Activity Tolerance Patient tolerated treatment well    Behavior During Therapy WFL for tasks assessed/performed             Past Medical History:  Diagnosis Date   Anxiety    Arthritis    Asthma    Cancer (HCC)    cervical cancer; pt states a long long time ago   CHF (congestive heart failure) (HCC)    Coronary artery disease    Depression    Diabetes mellitus without complication (HCC)    Dyspnea    Edema of all four extremities    Essential (primary) hypertension 06/24/2020   Hyperlipemia    Type II diabetes mellitus (HCC) 03/22/2022   Past Surgical History:  Procedure Laterality Date   ABDOMINAL HYSTERECTOMY     ANTERIOR LAT LUMBAR FUSION N/A 11/05/2019   Procedure: Lumbar Four-Five Anterolateral lumbar interbody fusion with lateral plate;  Surgeon: Barnett Abu, MD;  Location: [facility];  Service: Neurosurgery;  Laterality: N/A;  anterolateral   BACK SURGERY     pins   BACK SURGERY  07/11/2008   BIOPSY  06/29/2021   Procedure: BIOPSY;  Surgeon: Dolores Frame, MD;  Location: [facility];  Service: Gastroenterology;;   BLADDER REPAIR     CARPAL TUNNEL RELEASE Right    CESAREAN SECTION  1982, 1984   COLONOSCOPY N/A 06/12/2013   rehman: normal   CORONARY PRESSURE/FFR STUDY N/A 10/20/2022   Procedure: CORONARY PRESSURE/FFR STUDY;  Surgeon: [name], MD;  Location: [facility];  Service: Cardiovascular;  Laterality: N/A;   CYSTOSCOPY WITH INJECTION N/A 03/23/2023   Procedure: CYSTOSCOPY WITH INJECTION-botox;  Surgeon: Malen Gauze,  MD;  Location: [facility];  Service: Urology;  Laterality: N/A;   ESOPHAGOGASTRODUODENOSCOPY (EGD) WITH PROPOFOL N/A 06/29/2021   Procedure: ESOPHAGOGASTRODUODENOSCOPY (EGD) WITH PROPOFOL;  Surgeon: Dolores Frame, MD;  Location: [facility];  Service: Gastroenterology;  Laterality: N/A;  8:40   KNEE ARTHROSCOPY WITH MEDIAL MENISECTOMY Right 05/09/2019   Procedure: KNEE ARTHROSCOPY WITH MEDIAL MENISECTOMY AND LATERAL MENISECTOMY;  Surgeon: Vickki Hearing, MD;  Location: [facility];  Service: Orthopedics;  Laterality: Right;   KNEE ARTHROSCOPY WITH MEDIAL MENISECTOMY Left 06/27/2019   Procedure: KNEE ARTHROSCOPY WITH MEDIAL MENISCECTOMY;  Surgeon: Vickki Hearing, MD;  Location: [facility];  Service: Orthopedics;  Laterality: Left;   LEFT HEART CATH AND CORONARY ANGIOGRAPHY N/A 10/20/2022   Procedure: LEFT HEART CATH AND CORONARY ANGIOGRAPHY;  Surgeon: [name], MD;  [facility]: [facility];  Service: Cardiovascular;  Laterality: N/A;   TOTAL KNEE ARTHROPLASTY Left 03/15/2022   Procedure: TOTAL KNEE ARTHROPLASTY;  Surgeon: Vickki Hearing, MD;  Location: [facility];  Service: Orthopedics;  Laterality: Left;   TOTAL KNEE ARTHROPLASTY Right 02/21/2023   Procedure: TOTAL KNEE ARTHROPLASTY;  Surgeon: Vickki Hearing, MD;  Location: [facility];  Service: Orthopedics;  Laterality: Right;   Patient Active Problem List   Diagnosis Date Noted   OAB (overactive bladder)  03/23/2023   S/P total knee replacement, right 02/21/23 03/08/2023   Primary osteoarthritis of right knee 02/21/2023   Lesion of vulva 10/06/2022   Lichen sclerosus et atrophicus 10/06/2022   S/P hysterectomy with oophorectomy 10/06/2022   Hot flashes 10/06/2022   Current use of estrogen therapy 10/06/2022   Anxiety 03/22/2022   Asthma 03/22/2022   Bipolar 1 disorder (HCC) 03/22/2022   Chronic pain 03/22/2022   Edema 03/22/2022   Gastro-esophageal reflux 03/22/2022   Hyperlipidemia 03/22/2022   Insomnia  03/22/2022   Menopausal symptom 03/22/2022   Sinusitis 03/22/2022   Type II diabetes mellitus (HCC) 03/22/2022   Osteoarthritis of left knee 03/15/2022   History of operative procedure on lumbosacral spinal structure 02/15/2022   Urge incontinence 09/02/2021   Cervical radiculopathy 12/30/2020   Pseudoarthrosis of lumbar spine 08/25/2020   Essential (primary) hypertension 06/24/2020   Body mass index (BMI) 45.0-49.9, adult (HCC) 05/06/2020   Carpal tunnel syndrome 04/01/2020   Spondylolisthesis, lumbar region 11/22/2019   Localized swelling of both lower legs 11/12/2019   Lumbar stenosis 11/05/2019   S/P left knee arthroscopy 06/27/2019 07/01/2019   Derangement of posterior horn of medial meniscus of left knee    Primary osteoarthritis of left knee    S/P right knee arthroscopy 05/09/19 05/29/2019   Depression 07/24/2014   LOWER LEG, ARTHRITIS, DEGEN./OSTEO 02/18/2009   DERANGEMENT MENISCUS 02/18/2009   ANSERINE BURSITIS, RIGHT 02/18/2009    REFERRING PROVIDER: Fuller Canada MD  REFERRING DIAG: Right total knee replacement  THERAPY DIAG:  Chronic pain of right knee  Stiffness of right knee, not elsewhere classified  Localized edema  Rationale for Evaluation and Treatment: Rehabilitation  ONSET DATE: 02/21/23 (surgery date).  SUBJECTIVE:   SUBJECTIVE STATEMENT: Pain at 6/10. PERTINENT HISTORY: Left TKA, CTS, DM. PAIN:  Are you having pain? Yes: NPRS scale: 6/10 Pain location: Right knee. Pain description: Throbbing. Aggravating factors: As above. Relieving factors: As above.  PRECAUTIONS: Other: No ultrasound.   WEIGHT BEARING RESTRICTIONS: No  FALLS:  Has patient fallen in last 6 months? No  LIVING ENVIRONMENT: Lives with: lives with their family Lives in: House/apartment Stairs: 3 external steps.  Non-reciprocating. Has following equipment at home: Dan Humphreys - 2 wheeled  OCCUPATION: Disabled.  PLOF: Independent with basic ADLs  PATIENT GOALS: Not  have knee pain and  move better.   OBJECTIVE:   PATIENT SURVEYS:  FOTO .  EDEMA:  Circumferential: Right 4 cms > left.  PALPATION: C/o diffuse anterior right knee pain.  Post-op bandage is intact.  Visible ecchymosis.  ROM:  -15 degrees of right knee extension in supine and -10 degrees passive with flexion to 95 degrees.  LOWER EXTREMITY MMT:  Patient able to perform an antigravity right SLR and SAQ.   GAIT: The patient is walking safely with a FWW with decreases step and streide length.   TODAY'S TREATMENT:  DATE:      04/06/23:                                    EXERCISE LOG  Exercise Repetitions and Resistance Comments  Nustep  Level 4 x 15 minutes   Knee ext 10# x 3 minutes   Ham curls 30# x 3 minutes   Rockerboard In parallel bars x 5 minutes   Zero Knee 5# x 5 minutes   LE elevation and vasopneumatic to patient's right knee x 15 minutes.    04/03/23:                                     EXERCISE LOG        RT TKR  Exercise Repetitions and Resistance Comments  Nustep  Level 3 x 15 minutes moving seat forward x 2 to increase flexion.   Knee ext 10# x 3 minutes   Ham curls 30# x 3 minutes   Rockerboard in parallel bars 5 minutes   PF/DF and balance   LLLDS   extension X3 mins 3# wt   In supine:  Manual PROM right knee extension stretch with contract/ relax with quad sets  vasopneumatic on low x 15 minutes.                                                        03/27/23:                                   EXERCISE LOG  Exercise Repetitions and Resistance Comments  Nustep Level 3 x 16 mins; seat 8-6   Rockerboard In parallel bars x 5 minutes.   Forward Step Ups 6" box x 20 reps   LAQ's 3# x 25 reps bil   Standing Marches 3x10 reps bil   Standing Hip Abduction 3x10   Seated Hip Adduction 3 mins   Seated Ham Curls Red x 25 reps bil    STS x10 reps   Static Extension Stretch     Modalities  Date:  Vaso: Knee, Low pressure; 34 degrees, 15 mins, Pain and Edema   LOWER EXTREMITY ROM:     Active/Passive  Right Eval(03/08/23) Right(03/17/23)  Knee flexion 95 115/120  Knee extension -15(A)/ -10(P)        PATIENT EDUCATION:  Education details: Discussed HEP. Person educated: Patient Education method: Explanation Education comprehension: verbalized understanding  HOME EXERCISE PROGRAM:   ASSESSMENT:  CLINICAL IMPRESSION:  Patient is doing well.  Post Zero Knee stretch she achieved - 5 degrees of right knee extension.    OBJECTIVE IMPAIRMENTS: Abnormal gait, decreased activity tolerance, decreased ROM, decreased strength, increased edema, and pain.   ACTIVITY LIMITATIONS: carrying, lifting, standing, bathing, and locomotion level  PARTICIPATION LIMITATIONS: meal prep, cleaning, and laundry  PERSONAL FACTORS: Time since onset of injury/illness/exacerbation are also affecting patient's functional outcome.   REHAB POTENTIAL: Excellent  CLINICAL DECISION MAKING: Stable/uncomplicated  EVALUATION COMPLEXITY: Low   GOALS:  SHORT TERM GOALS: Target date: 03/22/23.  Ind with an initial HEP. Goal status: MET  2.  Full active  right knee extension.   9/11: -8 degrees Goal status: IN PROGRESS   LONG TERM GOALS: Target date: 06/06/23  Ind with an advanced HEP.  Goal status: IN PROGRESS  2.  Active right knee flexion to 115 degrees+ so the patient can perform functional tasks and do so with pain not > 2-3/10.  9/11: 122 degrees Goal status: MET  3.  Increase right hip and knee strength to a solid 4+ to 5/5 to provide good stability for accomplishment of functional activities.  Goal status: IN PROGRESS  4.  Perform a reciprocating stair gait with one railing with pain not > 2-3/10.  Goal status: IN PROGRESS  5.  Patient walk safely with a straight cane.  Goal status: MET  PLAN:  PT FREQUENCY:  2x/week  PT DURATION: 6 weeks  PLANNED INTERVENTIONS: Therapeutic exercises, Therapeutic activity, Neuromuscular re-education, Gait training, Patient/Family education, Self Care, Cryotherapy, Moist heat, Vasopneumatic device, and Manual therapy  PLAN FOR NEXT SESSION: Progress per TKA protocol.  LE elevation and vasopneumatic.   Zalea Pete, [place], PT 04/06/2023, 8:58 AM

## 2023-04-11 ENCOUNTER — Ambulatory Visit: Payer: 59 | Attending: Orthopedic Surgery | Admitting: *Deleted

## 2023-04-11 ENCOUNTER — Encounter: Payer: Self-pay | Admitting: *Deleted

## 2023-04-11 DIAGNOSIS — G8929 Other chronic pain: Secondary | ICD-10-CM | POA: Insufficient documentation

## 2023-04-11 DIAGNOSIS — R600 Localized edema: Secondary | ICD-10-CM | POA: Diagnosis present

## 2023-04-11 DIAGNOSIS — M25561 Pain in right knee: Secondary | ICD-10-CM | POA: Diagnosis present

## 2023-04-11 DIAGNOSIS — M25661 Stiffness of right knee, not elsewhere classified: Secondary | ICD-10-CM | POA: Diagnosis present

## 2023-04-11 NOTE — Therapy (Addendum)
OUTPATIENT PHYSICAL THERAPY LOWER EXTREMITY TREATMENT   Patient Name: LITONYA DROEGE MRN: [id number] DOB:Nov 01, 1960, 62 y.o., female Today's Date: 04/11/2023  END OF SESSION:  PT End of Session - 04/11/23 0753     Visit Number 10    Number of Visits 12    Date for PT Re-Evaluation 06/06/23    Authorization Type FOTO.    PT Start Time 0800    PT Stop Time 0850    PT Time Calculation (min) 50 min             Past Medical History:  Diagnosis Date   Anxiety    Arthritis    Asthma    Cancer (HCC)    cervical cancer; pt states a long long time ago   CHF (congestive heart failure) (HCC)    Coronary artery disease    Depression    Diabetes mellitus without complication (HCC)    Dyspnea    Edema of all four extremities    Essential (primary) hypertension 06/24/2020   Hyperlipemia    Type II diabetes mellitus (HCC) 03/22/2022   Past Surgical History:  Procedure Laterality Date   ABDOMINAL HYSTERECTOMY     ANTERIOR LAT LUMBAR FUSION N/A 11/05/2019   Procedure: Lumbar Four-Five Anterolateral lumbar interbody fusion with lateral plate;  Surgeon: Barnett Abu, MD;  Location: [facility];  Service: Neurosurgery;  Laterality: N/A;  anterolateral   BACK SURGERY     pins   BACK SURGERY  07/11/2008   BIOPSY  06/29/2021   Procedure: BIOPSY;  Surgeon: Dolores Frame, MD;  [facility]: [facility];  Service: Gastroenterology;;   BLADDER REPAIR     CARPAL TUNNEL RELEASE Right    CESAREAN SECTION  1982, 1984   COLONOSCOPY N/A 06/12/2013   rehman: normal   CORONARY PRESSURE/FFR STUDY N/A 10/20/2022   Procedure: CORONARY PRESSURE/FFR STUDY;  Surgeon: [name], MD;  [facility]: [facility];  Service: Cardiovascular;  Laterality: N/A;   CYSTOSCOPY WITH INJECTION N/A 03/23/2023   Procedure: CYSTOSCOPY WITH INJECTION-botox;  Surgeon: Malen Gauze, MD;  Location: [facility];  Service: Urology;  Laterality: N/A;   ESOPHAGOGASTRODUODENOSCOPY (EGD) WITH PROPOFOL N/A  06/29/2021   Procedure: ESOPHAGOGASTRODUODENOSCOPY (EGD) WITH PROPOFOL;  Surgeon: Dolores Frame, MD;  [facility]: [facility];  Service: Gastroenterology;  Laterality: N/A;  8:40   KNEE ARTHROSCOPY WITH MEDIAL MENISECTOMY Right 05/09/2019   Procedure: KNEE ARTHROSCOPY WITH MEDIAL MENISECTOMY AND LATERAL MENISECTOMY;  Surgeon: Vickki Hearing, MD;  Location: [facility];  Service: Orthopedics;  Laterality: Right;   KNEE ARTHROSCOPY WITH MEDIAL MENISECTOMY Left 06/27/2019   Procedure: KNEE ARTHROSCOPY WITH MEDIAL MENISCECTOMY;  Surgeon: Vickki Hearing, MD;  Location: [facility];  Service: Orthopedics;  Laterality: Left;   LEFT HEART CATH AND CORONARY ANGIOGRAPHY N/A 10/20/2022   Procedure: LEFT HEART CATH AND CORONARY ANGIOGRAPHY;  Surgeon: [name], MD;  [facility]: [facility];  Service: Cardiovascular;  Laterality: N/A;   TOTAL KNEE ARTHROPLASTY Left 03/15/2022   Procedure: TOTAL KNEE ARTHROPLASTY;  Surgeon: Vickki Hearing, MD;  Location: [facility];  Service: Orthopedics;  Laterality: Left;   TOTAL KNEE ARTHROPLASTY Right 02/21/2023   Procedure: TOTAL KNEE ARTHROPLASTY;  Surgeon: Vickki Hearing, MD;  Location: [facility];  Service: Orthopedics;  Laterality: Right;   Patient Active Problem List   Diagnosis Date Noted   OAB (overactive bladder) 03/23/2023   S/P total knee replacement, right 02/21/23 03/08/2023   Primary osteoarthritis of right knee 02/21/2023  Lesion of vulva 10/06/2022   Lichen sclerosus et atrophicus 10/06/2022   S/P hysterectomy with oophorectomy 10/06/2022   Hot flashes 10/06/2022   Current use of estrogen therapy 10/06/2022   Anxiety 03/22/2022   Asthma 03/22/2022   Bipolar 1 disorder (HCC) 03/22/2022   Chronic pain 03/22/2022   Edema 03/22/2022   Gastro-esophageal reflux 03/22/2022   Hyperlipidemia 03/22/2022   Insomnia 03/22/2022   Menopausal symptom 03/22/2022   Sinusitis 03/22/2022   Type II diabetes mellitus (HCC) 03/22/2022    Osteoarthritis of left knee 03/15/2022   History of operative procedure on lumbosacral spinal structure 02/15/2022   Urge incontinence 09/02/2021   Cervical radiculopathy 12/30/2020   Pseudoarthrosis of lumbar spine 08/25/2020   Essential (primary) hypertension 06/24/2020   Body mass index (BMI) 45.0-49.9, adult (HCC) 05/06/2020   Carpal tunnel syndrome 04/01/2020   Spondylolisthesis, lumbar region 11/22/2019   Localized swelling of both lower legs 11/12/2019   Lumbar stenosis 11/05/2019   S/P left knee arthroscopy 06/27/2019 07/01/2019   Derangement of posterior horn of medial meniscus of left knee    Primary osteoarthritis of left knee    S/P right knee arthroscopy 05/09/19 05/29/2019   Depression 07/24/2014   LOWER LEG, ARTHRITIS, DEGEN./OSTEO 02/18/2009   DERANGEMENT MENISCUS 02/18/2009   ANSERINE BURSITIS, RIGHT 02/18/2009    REFERRING PROVIDER: Fuller Canada MD  REFERRING DIAG: Right total knee replacement  THERAPY DIAG:  Chronic pain of right knee  Stiffness of right knee, not elsewhere classified  Localized edema  Rationale for Evaluation and Treatment: Rehabilitation  ONSET DATE: 02/21/23 (surgery date).  SUBJECTIVE:   SUBJECTIVE STATEMENT: Pain at 5-6/10.   RT knee   PERTINENT HISTORY: Left TKA, CTS, DM. PAIN:  Are you having pain? Yes: NPRS scale: 5-6/10 Pain location: Right knee. Pain description: Throbbing. Aggravating factors: As above. Relieving factors: As above.  PRECAUTIONS: Other: No ultrasound.   WEIGHT BEARING RESTRICTIONS: No  FALLS:  Has patient fallen in last 6 months? No  LIVING ENVIRONMENT: Lives with: lives with their family Lives in: House/apartment Stairs: 3 external steps.  Non-reciprocating. Has following equipment at home: Dan Humphreys - 2 wheeled  OCCUPATION: Disabled.  PLOF: Independent with basic ADLs  PATIENT GOALS: Not have knee pain and  move better.   OBJECTIVE:   PATIENT SURVEYS:  FOTO .  EDEMA:   Circumferential: Right 4 cms > left.  PALPATION: C/o diffuse anterior right knee pain.  Post-op bandage is intact.  Visible ecchymosis.  ROM:  -15 degrees of right knee extension in supine and -10 degrees passive with flexion to 95 degrees.  LOWER EXTREMITY MMT:  Patient able to perform an antigravity right SLR and SAQ.   GAIT: The patient is walking safely with a FWW with decreases step and streide length.   TODAY'S TREATMENT:  DATE:      04/11/23:                                    EXERCISE LOG   RT TKR  Exercise Repetitions and Resistance Comments  Nustep  Level 4 x 15 minutes   Knee ext 10# x 3 minutes   Ham curls 30# x 3 minutes   Rockerboard In parallel bars x 5 minutes   Zero Knee    LE elevation and vasopneumatic to patient's right knee x 15 minutes.   Manual PROM for extension 5 mins 04/03/23:                                     EXERCISE LOG        RT TKR  Exercise Repetitions and Resistance Comments  Nustep  Level 3 x 15 minutes moving seat forward x 2 to increase flexion.   Knee ext 10# x 3 minutes   Ham curls 30# x 3 minutes   Rockerboard in parallel bars 5 minutes   PF/DF and balance   LLLDS   extension X3 mins 3# wt   In supine:  Manual PROM right knee extension stretch with contract/ relax with quad sets  vasopneumatic on low x 15 minutes.                                                        03/27/23:                                   EXERCISE LOG  Exercise Repetitions and Resistance Comments  Nustep Level 3 x 16 mins; seat 8-6   Rockerboard In parallel bars x 5 minutes.   Forward Step Ups 6" box x 20 reps   LAQ's 3# x 25 reps bil   Standing Marches 3x10 reps bil   Standing Hip Abduction 3x10   Seated Hip Adduction 3 mins   Seated Ham Curls Red x 25 reps bil   STS x10 reps   Static Extension Stretch      Modalities  Date:  Vaso: Knee, Low pressure; 34 degrees, 15 mins, Pain and Edema   LOWER EXTREMITY ROM:     Active/Passive  Right Eval(03/08/23) Right(03/17/23)  Knee flexion 95 115/120  Knee extension -15(A)/ -10(P)        PATIENT EDUCATION:  Education details: Discussed HEP. Person educated: Patient Education method: Explanation Education comprehension: verbalized understanding  HOME EXERCISE PROGRAM:   ASSESSMENT:  CLINICAL IMPRESSION:  Patient is doing well with RT TKR and was able to continue with RT LE strengthening, ROM, and balance exs. She reports going up steps with RT LE now, but not down.  2 out of 5 LTGs  MET    OBJECTIVE IMPAIRMENTS: Abnormal gait, decreased activity tolerance, decreased ROM, decreased strength, increased edema, and pain.   ACTIVITY LIMITATIONS: carrying, lifting, standing, bathing, and locomotion level  PARTICIPATION LIMITATIONS: meal prep, cleaning, and laundry  PERSONAL FACTORS: Time since onset of injury/illness/exacerbation are also affecting patient's functional outcome.   REHAB POTENTIAL: Excellent  CLINICAL DECISION MAKING: Stable/uncomplicated  EVALUATION COMPLEXITY: Low   GOALS:  SHORT TERM GOALS: Target date: 03/22/23.  Ind with an initial HEP. Goal status: MET  2.  Full active right knee extension.   9/11: -8 degrees Goal status: MET   LONG TERM GOALS: Target date: 06/06/23  Ind with an advanced HEP.  Goal status: IN PROGRESS  2.  Active right knee flexion to 115 degrees+ so the patient can perform functional tasks and do so with pain not > 2-3/10.  9/11: 122 degrees Goal status: MET  3.  Increase right hip and knee strength to a solid 4+ to 5/5 to provide good stability for accomplishment of functional activities.  Goal status: IN PROGRESS 4/5  4.  Perform a reciprocating stair gait with one railing with pain not > 2-3/10.  Goal status: IN PROGRESS   UP, but not down  5.  Patient walk safely with a  straight cane.  Goal status: MET  PLAN:  PT FREQUENCY: 2x/week  PT DURATION: 6 weeks  PLANNED INTERVENTIONS: Therapeutic exercises, Therapeutic activity, Neuromuscular re-education, Gait training, Patient/Family education, Self Care, Cryotherapy, Moist heat, Vasopneumatic device, and Manual therapy  PLAN FOR NEXT SESSION: Progress per TKA protocol.  LE elevation and vasopneumatic.   Kewon Statler,CHRIS, PTA 04/11/2023, 9:56 AM   Progress Note Reporting Period 03/08/23 to 04/14/23.  See note below for Objective Data and Assessment of Progress/Goals. Good progress toward goals with LTG's #2 and #5 met at this time.    [place] MPT

## 2023-04-13 ENCOUNTER — Encounter: Payer: Self-pay | Admitting: *Deleted

## 2023-04-13 ENCOUNTER — Ambulatory Visit: Payer: 59

## 2023-04-13 DIAGNOSIS — M25661 Stiffness of right knee, not elsewhere classified: Secondary | ICD-10-CM

## 2023-04-13 DIAGNOSIS — G8929 Other chronic pain: Secondary | ICD-10-CM

## 2023-04-13 DIAGNOSIS — R600 Localized edema: Secondary | ICD-10-CM

## 2023-04-13 DIAGNOSIS — M25561 Pain in right knee: Secondary | ICD-10-CM | POA: Diagnosis not present

## 2023-04-13 NOTE — Therapy (Signed)
OUTPATIENT PHYSICAL THERAPY LOWER EXTREMITY TREATMENT   Patient Name: [name] MRN: [id number] DOB:[birth year], 62 y.o., female Today's Date: 04/13/2023  END OF SESSION:  PT End of Session - 04/13/23 0803     Visit Number 11    Number of Visits 12    Date for PT Re-Evaluation 06/06/23    Authorization Type FOTO.    PT Start Time 0800    PT Stop Time 0841    PT Time Calculation (min) 41 min    Activity Tolerance Patient tolerated treatment well    Behavior During Therapy WFL for tasks assessed/performed              Past Medical History:  Diagnosis Date   Anxiety    Arthritis    Asthma    Cancer (HCC)    cervical cancer; pt states a long long time ago   CHF (congestive heart failure) (HCC)    Coronary artery disease    Depression    Diabetes mellitus without complication (HCC)    Dyspnea    Edema of all four extremities    Essential (primary) hypertension 06/24/2020   Hyperlipemia    Type II diabetes mellitus (HCC) 03/22/2022   Past Surgical History:  Procedure Laterality Date   ABDOMINAL HYSTERECTOMY     ANTERIOR LAT LUMBAR FUSION N/A 11/05/2019   Procedure: Lumbar Four-Five Anterolateral lumbar interbody fusion with lateral plate;  Surgeon: Barnett Abu, MD;  Location: [facility];  Service: Neurosurgery;  Laterality: N/A;  anterolateral   BACK SURGERY     pins   BACK SURGERY  07/11/2008   BIOPSY  06/29/2021   Procedure: BIOPSY;  Surgeon: Dolores Frame, MD;  Location: [facility];  Service: Gastroenterology;;   BLADDER REPAIR     CARPAL TUNNEL RELEASE Right    CESAREAN SECTION  1982, 1984   COLONOSCOPY N/A 06/12/2013   rehman: normal   CORONARY PRESSURE/FFR STUDY N/A 10/20/2022   Procedure: CORONARY PRESSURE/FFR STUDY;  Surgeon: [name], MD;  Location: [facility];  Service: Cardiovascular;  Laterality: N/A;   CYSTOSCOPY WITH INJECTION N/A 03/23/2023   Procedure: CYSTOSCOPY WITH INJECTION-botox;  Surgeon: Malen Gauze, MD;  Location: [facility];  Service: Urology;  Laterality: N/A;   ESOPHAGOGASTRODUODENOSCOPY (EGD) WITH PROPOFOL N/A 06/29/2021   Procedure: ESOPHAGOGASTRODUODENOSCOPY (EGD) WITH PROPOFOL;  Surgeon: Dolores Frame, MD;  Location: [facility];  Service: Gastroenterology;  Laterality: N/A;  8:40   KNEE ARTHROSCOPY WITH MEDIAL MENISECTOMY Right 05/09/2019   Procedure: KNEE ARTHROSCOPY WITH MEDIAL MENISECTOMY AND LATERAL MENISECTOMY;  Surgeon: Vickki Hearing, MD;  Location: [facility];  Service: Orthopedics;  Laterality: Right;   KNEE ARTHROSCOPY WITH MEDIAL MENISECTOMY Left 06/27/2019   Procedure: KNEE ARTHROSCOPY WITH MEDIAL MENISCECTOMY;  Surgeon: Vickki Hearing, MD;  Location: [facility];  Service: Orthopedics;  Laterality: Left;   LEFT HEART CATH AND CORONARY ANGIOGRAPHY N/A 10/20/2022   Procedure: LEFT HEART CATH AND CORONARY ANGIOGRAPHY;  Surgeon: [name], MD;  Location: [facility];  Service: Cardiovascular;  Laterality: N/A;   TOTAL KNEE ARTHROPLASTY Left 03/15/2022   Procedure: TOTAL KNEE ARTHROPLASTY;  Surgeon: Vickki Hearing, MD;  Location: [facility];  Service: Orthopedics;  Laterality: Left;   TOTAL KNEE ARTHROPLASTY Right 02/21/2023   Procedure: TOTAL KNEE ARTHROPLASTY;  Surgeon: Vickki Hearing, MD;  Location: [facility];  Service: Orthopedics;  Laterality: Right;   Patient Active Problem List   Diagnosis Date Noted   OAB (overactive  bladder) 03/23/2023   S/P total knee replacement, right 02/21/23 03/08/2023   Primary osteoarthritis of right knee 02/21/2023   Lesion of vulva 10/06/2022   Lichen sclerosus et atrophicus 10/06/2022   S/P hysterectomy with oophorectomy 10/06/2022   Hot flashes 10/06/2022   Current use of estrogen therapy 10/06/2022   Anxiety 03/22/2022   Asthma 03/22/2022   Bipolar 1 disorder (HCC) 03/22/2022   Chronic pain 03/22/2022   Edema 03/22/2022   Gastro-esophageal reflux 03/22/2022   Hyperlipidemia 03/22/2022   Insomnia  03/22/2022   Menopausal symptom 03/22/2022   Sinusitis 03/22/2022   Type II diabetes mellitus (HCC) 03/22/2022   Osteoarthritis of left knee 03/15/2022   History of operative procedure on lumbosacral spinal structure 02/15/2022   Urge incontinence 09/02/2021   Cervical radiculopathy 12/30/2020   Pseudoarthrosis of lumbar spine 08/25/2020   Essential (primary) hypertension 06/24/2020   Body mass index (BMI) 45.0-49.9, adult (HCC) 05/06/2020   Carpal tunnel syndrome 04/01/2020   Spondylolisthesis, lumbar region 11/22/2019   Localized swelling of both lower legs 11/12/2019   Lumbar stenosis 11/05/2019   S/P left knee arthroscopy 06/27/2019 07/01/2019   Derangement of posterior horn of medial meniscus of left knee    Primary osteoarthritis of left knee    S/P right knee arthroscopy 05/09/19 05/29/2019   Depression 07/24/2014   LOWER LEG, ARTHRITIS, DEGEN./OSTEO 02/18/2009   DERANGEMENT MENISCUS 02/18/2009   ANSERINE BURSITIS, RIGHT 02/18/2009    REFERRING PROVIDER: Fuller Canada MD  REFERRING DIAG: Right total knee replacement  THERAPY DIAG:  Chronic pain of right knee  Stiffness of right knee, not elsewhere classified  Localized edema  Rationale for Evaluation and Treatment: Rehabilitation  ONSET DATE: 02/21/23 (surgery date).  SUBJECTIVE:   SUBJECTIVE STATEMENT: Patient reports that she feels good today, but her knee is sore. She feels that she is almost 100% back to normal. She feels that she is able to pretty much everything she wants to or needs to be able to do at home.   PERTINENT HISTORY: Left TKA, CTS, DM. PAIN:  Are you having pain? Yes: NPRS scale: 5/10 Pain location: Right knee. Pain description: Throbbing. Aggravating factors: As above. Relieving factors: As above.  PRECAUTIONS: Other: No ultrasound.   WEIGHT BEARING RESTRICTIONS: No  FALLS:  Has patient fallen in last 6 months? No  LIVING ENVIRONMENT: Lives with: lives with their  family Lives in: House/apartment Stairs: 3 external steps.  Non-reciprocating. Has following equipment at home: Dan Humphreys - 2 wheeled  OCCUPATION: Disabled.  PLOF: Independent with basic ADLs  PATIENT GOALS: Not have knee pain and  move better.   OBJECTIVE:   PATIENT SURVEYS:  FOTO .  EDEMA:  Circumferential: Right 4 cms > left.  PALPATION: C/o diffuse anterior right knee pain.  Post-op bandage is intact.  Visible ecchymosis.  LOWER EXTREMITY ROM:     Active  Right 04/13/23 Left 04/13/23  Hip flexion    Hip extension    Hip abduction    Hip adduction    Hip internal rotation    Hip external rotation    Knee flexion 128 130  Knee extension 7 3  Ankle dorsiflexion    Ankle plantarflexion    Ankle inversion    Ankle eversion     (Blank rows = not tested)   LOWER EXTREMITY MMT:  Patient able to perform an antigravity right SLR and SAQ.   GAIT: The patient is walking safely with a FWW with decreases step and streide length.   TODAY'S TREATMENT:  DATE:                                       04/13/23 EXERCISE LOG  Exercise Repetitions and Resistance Comments  Nustep  L4 x 16 minutes; seat 8-7   Cybex knee extension  20# x 3 minutes   Cybex knee flexion  40# x 3 minutes   Rocker board  5 minutes    SLR  20 reps  RLE only   Bridge  20 reps     Blank cell = exercise not performed today                                     04/11/23: EXERCISE LOG   RT TKR  Exercise Repetitions and Resistance Comments  Nustep  Level 4 x 15 minutes   Knee ext 10# x 3 minutes   Ham curls 30# x 3 minutes   Rockerboard In parallel bars x 5 minutes   Zero Knee    LE elevation and vasopneumatic to patient's right knee x 15 minutes.   Manual PROM for extension 5 mins 04/03/23:                                     EXERCISE LOG        RT TKR  Exercise Repetitions  and Resistance Comments  Nustep  Level 3 x 15 minutes moving seat forward x 2 to increase flexion.   Knee ext 10# x 3 minutes   Ham curls 30# x 3 minutes   Rockerboard in parallel bars 5 minutes   PF/DF and balance   LLLDS   extension X3 mins 3# wt   In supine:  Manual PROM right knee extension stretch with contract/ relax with quad sets  vasopneumatic on low x 15 minutes.  PATIENT EDUCATION:  Education details: Discussed HEP. Person educated: Patient Education method: Explanation Education comprehension: verbalized understanding  HOME EXERCISE PROGRAM:   ASSESSMENT:  CLINICAL IMPRESSION:  Patient is making good progress with skilled physical therapy as evidenced by her subjective reports, objective measures, functional mobility, and progress toward her goals. She was able to meet all of her short term goals for therapy. However, she has yet to meet her goals for improved right hip and knee strength in addition to being able to navigate her steps with a reciprocal pattern. She was able to demonstrate a significant improvement in right knee active range of motion since her initial evaluation and this motion is almost equal to left knee active range of motion. She was progressed with familiar interventions for improved lower extremity strength. She required minimal cuing with today's interventions for the full arc of motion to promote muscular engagement. She reported feeling good upon the conclusion of treatment. Recommend that she complete her current plan of care prior to following up with her referring physician prior to being discharged with a HEP.    OBJECTIVE IMPAIRMENTS: Abnormal gait, decreased activity tolerance, decreased ROM, decreased strength, increased edema, and pain.   ACTIVITY LIMITATIONS: carrying, lifting, standing, bathing, and locomotion level  PARTICIPATION LIMITATIONS: meal prep, cleaning, and laundry  PERSONAL FACTORS: Time since onset of  injury/illness/exacerbation are also affecting patient's functional outcome.   REHAB POTENTIAL: Excellent  CLINICAL DECISION  MAKING: Stable/uncomplicated  EVALUATION COMPLEXITY: Low   GOALS:  SHORT TERM GOALS: Target date: 03/22/23.  Ind with an initial HEP. Goal status: MET  2.  Full active right knee extension.   9/11: -8 degrees Goal status: MET   LONG TERM GOALS: Target date: 06/06/23  Ind with an advanced HEP.  Goal status: IN PROGRESS  2.  Active right knee flexion to 115 degrees+ so the patient can perform functional tasks and do so with pain not > 2-3/10.  9/11: 122 degrees Goal status: MET  3.  Increase right hip and knee strength to a solid 4+ to 5/5 to provide good stability for accomplishment of functional activities.   Baseline: R hip flexion: 4-/5, R knee extension: 4/5, R knee flexion: 4+/5 Goal status: IN PROGRESS   4.  Perform a reciprocating stair gait with one railing with pain not > 2-3/10.   Baseline: step to pattern ascending and descending Goal status: IN PROGRESS    5.  Patient walk safely with a straight cane.  Goal status: MET  PLAN:  PT FREQUENCY: 2x/week  PT DURATION: 6 weeks  PLANNED INTERVENTIONS: Therapeutic exercises, Therapeutic activity, Neuromuscular re-education, Gait training, Patient/Family education, Self Care, Cryotherapy, Moist heat, Vasopneumatic device, and Manual therapy  PLAN FOR NEXT SESSION: Progress per TKA protocol.  LE elevation and vasopneumatic.   Granville Lewis, PT 04/13/2023, 10:35 AM

## 2023-04-17 ENCOUNTER — Ambulatory Visit: Payer: 59

## 2023-04-17 DIAGNOSIS — G8929 Other chronic pain: Secondary | ICD-10-CM

## 2023-04-17 DIAGNOSIS — M25661 Stiffness of right knee, not elsewhere classified: Secondary | ICD-10-CM

## 2023-04-17 DIAGNOSIS — M25561 Pain in right knee: Secondary | ICD-10-CM | POA: Diagnosis not present

## 2023-04-17 DIAGNOSIS — R600 Localized edema: Secondary | ICD-10-CM

## 2023-04-17 NOTE — Therapy (Signed)
OUTPATIENT PHYSICAL THERAPY LOWER EXTREMITY TREATMENT   Patient Name: [name] MRN: [id number] DOB:12-06-1960, 62 y.o., female Today's Date: 04/17/2023  END OF SESSION:  PT End of Session - 04/17/23 0810     Visit Number 12    Number of Visits 16    Date for PT Re-Evaluation 06/06/23    Authorization Type FOTO.    PT Start Time 0800    PT Stop Time 0840    PT Time Calculation (min) 40 min    Activity Tolerance Patient tolerated treatment well    Behavior During Therapy WFL for tasks assessed/performed               Past Medical History:  Diagnosis Date   Anxiety    Arthritis    Asthma    Cancer (HCC)    cervical cancer; pt states a long long time ago   CHF (congestive heart failure) (HCC)    Coronary artery disease    Depression    Diabetes mellitus without complication (HCC)    Dyspnea    Edema of all four extremities    Essential (primary) hypertension 06/24/2020   Hyperlipemia    Type II diabetes mellitus (HCC) 03/22/2022   Past Surgical History:  Procedure Laterality Date   ABDOMINAL HYSTERECTOMY     ANTERIOR LAT LUMBAR FUSION N/A 11/05/2019   Procedure: Lumbar Four-Five Anterolateral lumbar interbody fusion with lateral plate;  Surgeon: Barnett Abu, MD;  Location: [facility];  Service: Neurosurgery;  Laterality: N/A;  anterolateral   BACK SURGERY     pins   BACK SURGERY  07/11/2008   BIOPSY  06/29/2021   Procedure: BIOPSY;  Surgeon: Dolores Frame, MD;  [facility]: [facility];  Service: Gastroenterology;;   BLADDER REPAIR     CARPAL TUNNEL RELEASE Right    CESAREAN SECTION  1982, 1984   COLONOSCOPY N/A 06/12/2013   rehman: normal   CORONARY PRESSURE/FFR STUDY N/A 10/20/2022   Procedure: CORONARY PRESSURE/FFR STUDY;  Surgeon: [name], MD;  Location: [facility];  Service: Cardiovascular;  Laterality: N/A;   CYSTOSCOPY WITH INJECTION N/A 03/23/2023   Procedure: CYSTOSCOPY WITH INJECTION-botox;  Surgeon: Malen Gauze, MD;  Location: [facility];  Service: Urology;  Laterality: N/A;   ESOPHAGOGASTRODUODENOSCOPY (EGD) WITH PROPOFOL N/A 06/29/2021   Procedure: ESOPHAGOGASTRODUODENOSCOPY (EGD) WITH PROPOFOL;  Surgeon: Dolores Frame, MD;  Location: [facility];  Service: Gastroenterology;  Laterality: N/A;  8:40   KNEE ARTHROSCOPY WITH MEDIAL MENISECTOMY Right 05/09/2019   Procedure: KNEE ARTHROSCOPY WITH MEDIAL MENISECTOMY AND LATERAL MENISECTOMY;  Surgeon: Vickki Hearing, MD;  Location: [facility];  Service: Orthopedics;  Laterality: Right;   KNEE ARTHROSCOPY WITH MEDIAL MENISECTOMY Left 06/27/2019   Procedure: KNEE ARTHROSCOPY WITH MEDIAL MENISCECTOMY;  Surgeon: Vickki Hearing, MD;  Location: [facility];  Service: Orthopedics;  Laterality: Left;   LEFT HEART CATH AND CORONARY ANGIOGRAPHY N/A 10/20/2022   Procedure: LEFT HEART CATH AND CORONARY ANGIOGRAPHY;  Surgeon: [name], MD;  [facility]: [facility];  Service: Cardiovascular;  Laterality: N/A;   TOTAL KNEE ARTHROPLASTY Left 03/15/2022   Procedure: TOTAL KNEE ARTHROPLASTY;  Surgeon: Vickki Hearing, MD;  Location: [facility];  Service: Orthopedics;  Laterality: Left;   TOTAL KNEE ARTHROPLASTY Right 02/21/2023   Procedure: TOTAL KNEE ARTHROPLASTY;  Surgeon: Vickki Hearing, MD;  Location: [facility];  Service: Orthopedics;  Laterality: Right;   Patient Active Problem List   Diagnosis Date Noted   OAB (  overactive bladder) 03/23/2023   S/P total knee replacement, right 02/21/23 03/08/2023   Primary osteoarthritis of right knee 02/21/2023   Lesion of vulva 10/06/2022   Lichen sclerosus et atrophicus 10/06/2022   S/P hysterectomy with oophorectomy 10/06/2022   Hot flashes 10/06/2022   Current use of estrogen therapy 10/06/2022   Anxiety 03/22/2022   Asthma 03/22/2022   Bipolar 1 disorder (HCC) 03/22/2022   Chronic pain 03/22/2022   Edema 03/22/2022   Gastro-esophageal reflux 03/22/2022   Hyperlipidemia 03/22/2022    Insomnia 03/22/2022   Menopausal symptom 03/22/2022   Sinusitis 03/22/2022   Type II diabetes mellitus (HCC) 03/22/2022   Osteoarthritis of left knee 03/15/2022   History of operative procedure on lumbosacral spinal structure 02/15/2022   Urge incontinence 09/02/2021   Cervical radiculopathy 12/30/2020   Pseudoarthrosis of lumbar spine 08/25/2020   Essential (primary) hypertension 06/24/2020   Body mass index (BMI) 45.0-49.9, adult (HCC) 05/06/2020   Carpal tunnel syndrome 04/01/2020   Spondylolisthesis, lumbar region 11/22/2019   Localized swelling of both lower legs 11/12/2019   Lumbar stenosis 11/05/2019   S/P left knee arthroscopy 06/27/2019 07/01/2019   Derangement of posterior horn of medial meniscus of left knee    Primary osteoarthritis of left knee    S/P right knee arthroscopy 05/09/19 05/29/2019   Depression 07/24/2014   LOWER LEG, ARTHRITIS, DEGEN./OSTEO 02/18/2009   DERANGEMENT MENISCUS 02/18/2009   ANSERINE BURSITIS, RIGHT 02/18/2009    REFERRING PROVIDER: Fuller Canada MD  REFERRING DIAG: Right total knee replacement  THERAPY DIAG:  Chronic pain of right knee  Stiffness of right knee, not elsewhere classified  Localized edema  Rationale for Evaluation and Treatment: Rehabilitation  ONSET DATE: 02/21/23 (surgery date).  SUBJECTIVE:   SUBJECTIVE STATEMENT: Patient reports that her knee is a little sore today. She feels that she is able to do everything she needs to at home now. However, she still feels that her right leg is really weak.   PERTINENT HISTORY: Left TKA, CTS, DM. PAIN:  Are you having pain? Yes: NPRS scale: 2/10 Pain location: Right knee. Pain description: Sore Aggravating factors: As above. Relieving factors: As above.  PRECAUTIONS: Other: No ultrasound.   WEIGHT BEARING RESTRICTIONS: No  FALLS:  Has patient fallen in last 6 months? No  LIVING ENVIRONMENT: Lives with: lives with their family Lives in:  House/apartment Stairs: 3 external steps.  Non-reciprocating. Has following equipment at home: Dan Humphreys - 2 wheeled  OCCUPATION: Disabled.  PLOF: Independent with basic ADLs  PATIENT GOALS: Not have knee pain and  move better.   OBJECTIVE:   PATIENT SURVEYS:  FOTO .  EDEMA:  Circumferential: Right 4 cms > left.  PALPATION: C/o diffuse anterior right knee pain.  Post-op bandage is intact.  Visible ecchymosis.  LOWER EXTREMITY ROM:     Active  Right 04/13/23 Left 04/13/23  Hip flexion    Hip extension    Hip abduction    Hip adduction    Hip internal rotation    Hip external rotation    Knee flexion 128 130  Knee extension 7 3  Ankle dorsiflexion    Ankle plantarflexion    Ankle inversion    Ankle eversion     (Blank rows = not tested)   LOWER EXTREMITY MMT: Patient able to perform an antigravity right SLR and SAQ.   GAIT: The patient is walking safely with a FWW with decreases step and streide length.   TODAY'S TREATMENT:  DATE:                                       04/17/23 EXERCISE LOG  Exercise Repetitions and Resistance Comments  Nustep  L3-4 x 16 minutes; seat 10-8    Eccentric heel tap  4" step x 20 reps  RLE on step  Cybex knee extension  20# x 3 minutes   Cybex knee flexion  40# x 3 minutes   Rocker board  5 minutes    Blank cell = exercise not performed today                                    04/13/23 EXERCISE LOG  Exercise Repetitions and Resistance Comments  Nustep  L4 x 16 minutes; seat 8-7   Cybex knee extension  20# x 3 minutes   Cybex knee flexion  40# x 3 minutes   Rocker board  5 minutes    SLR  20 reps  RLE only   Bridge  20 reps     Blank cell = exercise not performed today                                     04/11/23: EXERCISE LOG   RT TKR  Exercise Repetitions and Resistance Comments  Nustep  Level 4 x 15  minutes   Knee ext 10# x 3 minutes   Ham curls 30# x 3 minutes   Rockerboard In parallel bars x 5 minutes   Zero Knee    LE elevation and vasopneumatic to patient's right knee x 15 minutes.   Manual PROM for extension 5 mins 04/03/23:  PATIENT EDUCATION:  Education details: Discussed HEP. Person educated: Patient Education method: Explanation Education comprehension: verbalized understanding  HOME EXERCISE PROGRAM:   ASSESSMENT:  CLINICAL IMPRESSION:  Patient was progressed with eccentric heel taps for improved function navigating stairs with moderate difficulty and fatigue. She required minimal cueing throughout treatment for a slower, controlled pace to facilitate a full arc of motion. She experienced a moderate increase in lower extremity fatigue with today's new and weighted interventions as evidenced by her knee mobility while completing these interventions. However, this was able to be improved with a brief rest break between interventions. She reported that her knee felt good upon the conclusion of treatment. She continues to be at an elevated risk of falling due to her reduced lower extremity strength and power as evidenced by her five time sit to stand and timed up and go times. She would benefit from four additional visits to address her remaining impairments to maximize her safety and functional mobility while reducing her fall risk.    OBJECTIVE IMPAIRMENTS: Abnormal gait, decreased activity tolerance, decreased ROM, decreased strength, increased edema, and pain.   ACTIVITY LIMITATIONS: carrying, lifting, standing, bathing, and locomotion level  PARTICIPATION LIMITATIONS: meal prep, cleaning, and laundry  PERSONAL FACTORS: Time since onset of injury/illness/exacerbation are also affecting patient's functional outcome.   REHAB POTENTIAL: Excellent  CLINICAL DECISION MAKING: Stable/uncomplicated  EVALUATION COMPLEXITY: Low   GOALS:  SHORT TERM GOALS: Target date:  03/22/23.  Ind with an initial HEP. Goal status: MET  2.  Full active right knee extension.   9/11: -8 degrees Goal status: MET  LONG TERM GOALS: Target date: 06/06/23  Ind with an advanced HEP.  Goal status: IN PROGRESS  2.  Active right knee flexion to 115 degrees+ so the patient can perform functional tasks and do so with pain not > 2-3/10.  9/11: 122 degrees Goal status: MET  3.  Increase right hip and knee strength to a solid 4+ to 5/5 to provide good stability for accomplishment of functional activities.   Baseline: R hip flexion: 4-/5, R knee extension: 4/5, R knee flexion: 4+/5 Goal status: IN PROGRESS   4.  Perform a reciprocating stair gait with one railing with pain not > 2-3/10.   Baseline: step to pattern ascending and descending Goal status: IN PROGRESS    5.  Patient walk safely with a straight cane.  Goal status: MET  6.  Patient will improve her five time sit to stand time to 15 second or less for improved lower extremity power.  Baseline: 22.82 seconds without UE support on 04/17/23 Goal status: INITIAL   7.  Patient will improve her timed up and go time to 12 seconds or less to reduce her fall risk.  Baseline: 14.65 seconds on 04/17/23 Goal status: INITIAL   PLAN:  PT FREQUENCY: 2x/week  PT DURATION: 6 weeks  PLANNED INTERVENTIONS: Therapeutic exercises, Therapeutic activity, Neuromuscular re-education, Gait training, Patient/Family education, Self Care, Cryotherapy, Moist heat, Vasopneumatic device, and Manual therapy  PLAN FOR NEXT SESSION: Progress per TKA protocol.  LE elevation and vasopneumatic.   Granville Lewis, PT 04/17/2023, 8:44 AM

## 2023-04-18 ENCOUNTER — Telehealth: Payer: Self-pay | Admitting: Internal Medicine

## 2023-04-18 NOTE — Telephone Encounter (Signed)
Room 3, clearance to hold Eliquis Thanks

## 2023-04-18 NOTE — Telephone Encounter (Signed)
Questionnaire in review

## 2023-04-18 NOTE — Telephone Encounter (Signed)
Procedure: Colonoscopy  Estimated body mass index is 40.85 kg/m as calculated from the following:   Height as of 03/23/23: 5\' 4"  (1.626 m).   Weight as of 03/30/23: 238 lb (108 kg).   Have you had a colonoscopy before?  Yes 10 years ago, EGD with Dr. Levon Hedger in 2022  Do you have family history of colon cancer?  no  Do you have a family history of polyps? no  Previous colonoscopy with polyps removed? no  Do you have a history colorectal cancer?   no  Are you diabetic?    Do you have a prosthetic or mechanical heart valve? no  Do you have a pacemaker/defibrillator?   no  Have you had endocarditis/atrial fibrillation?  no  Do you use supplemental oxygen/CPAP?  no  Have you had joint replacement within the last 12 months?  Yes knees  Do you tend to be constipated or have to use laxatives?  no   Do you have history of alcohol use? If yes, how much and how often.  no  Do you have history or are you using drugs? If yes, what do are you  using?  no  Have you ever had a stroke/heart attack?  no  Have you ever had a heart or other vascular stent placed,?no  Do you take weight loss medication? no  female patients,: have you had a hysterectomy? yes                              are you post menopausal?  yes                              do you still have your menstrual cycle? no    Date of last menstrual period?   Do you take any blood-thinning medications such as: (Plavix, aspirin, Coumadin, Aggrenox, Brilinta, Xarelto, Eliquis, Pradaxa, Savaysa or Effient)? aspirin  If yes we need the name, milligram, dosage and who is prescribing doctor:               Current Outpatient Medications  Medication Sig Dispense Refill   albuterol (VENTOLIN HFA) 108 (90 Base) MCG/ACT inhaler Inhale 1-2 puffs into the lungs every 6 (six) hours as needed for wheezing or shortness of breath.     ARIPiprazole (ABILIFY) 2 MG tablet Take 2 mg by mouth daily.     aspirin 81 MG chewable tablet Chew 1  tablet (81 mg total) by mouth 2 (two) times daily. 90 tablet 0   atorvastatin (LIPITOR) 80 MG tablet Take 1 tablet (80 mg total) by mouth daily. (Patient taking differently: Take 40 mg by mouth at bedtime.) 90 tablet 3   bisacodyl (DULCOLAX) 5 MG EC tablet Take 1 tablet (5 mg total) by mouth daily as needed for moderate constipation. 30 tablet 0   celecoxib (CELEBREX) 100 MG capsule Take 1 capsule (100 mg total) by mouth 2 (two) times daily. 60 capsule 2   ELIQUIS 2.5 MG TABS tablet TAKE ONE TABLET BY MOUTH TWICE DAILY 60 tablet 0   Empagliflozin-metFORMIN HCl ER (SYNJARDY XR) 12.11-998 MG TB24 Take 1 tablet by mouth daily.     estradiol (ESTRACE) 0.1 MG/GM vaginal cream Discard plastic applicator. Insert a blueberry size amount (approximately 1 gram) of cream on fingertip inside vagina at bedtime every night for 1 week then 2 nights per week for long term use. 30 g  3   estradiol (ESTRACE) 2 MG tablet TAKE ONE TABLET AT BEDTIME 30 tablet 11   FLUoxetine (PROZAC) 40 MG capsule Take 40 mg by mouth daily.     furosemide (LASIX) 20 MG tablet Take 1 tablet (20 mg total) by mouth 2 (two) times daily. 60 tablet 0   gabapentin (NEURONTIN) 300 MG capsule Take 1 capsule (300 mg total) by mouth 3 (three) times daily. 21 capsule 3   loratadine (CLARITIN) 10 MG tablet Take 10 mg by mouth daily.     methocarbamol (ROBAXIN) 500 MG tablet Take 1 tablet (500 mg total) by mouth every 6 (six) hours as needed for muscle spasms. 56 tablet 2   metoprolol tartrate (LOPRESSOR) 25 MG tablet Take 1 tablet (25 mg total) by mouth 2 (two) times daily. 180 tablet 3   mirabegron ER (MYRBETRIQ) 25 MG TB24 tablet Take 2 tablets (50 mg total) by mouth daily. 60 tablet 11   nitroGLYCERIN (NITROSTAT) 0.4 MG SL tablet Place 1 tablet (0.4 mg total) under the tongue every 5 (five) minutes as needed for chest pain. 25 tablet 3   omeprazole (PRILOSEC) 40 MG capsule Take 1 capsule (40 mg total) by mouth daily. 90 capsule 3    oxyCODONE-acetaminophen (PERCOCET) 7.5-325 MG tablet Take 1 tablet by mouth every 4 (four) hours as needed for severe pain.     oxymetazoline (AFRIN) 0.05 % nasal spray Place 1 spray into both nostrils 2 (two) times daily as needed for congestion.     potassium chloride SA (KLOR-CON M20) 20 MEQ tablet Take 1 tablet (20 mEq total) by mouth daily. 90 tablet 3   spironolactone (ALDACTONE) 25 MG tablet Take 1 tablet (25 mg total) by mouth daily. 90 tablet 3   torsemide (DEMADEX) 20 MG tablet Take 2 tablets (40 mg total) by mouth daily. 180 tablet 3   traMADol (ULTRAM) 50 MG tablet Take 1 tablet (50 mg total) by mouth every 6 (six) hours as needed. 5 tablet 0   traZODone (DESYREL) 100 MG tablet Take 200 mg by mouth at bedtime.     No current facility-administered medications for this visit.    No Known Allergies  [employer]

## 2023-04-19 ENCOUNTER — Telehealth: Payer: Self-pay | Admitting: Orthopedic Surgery

## 2023-04-19 ENCOUNTER — Telehealth: Payer: Self-pay | Admitting: *Deleted

## 2023-04-19 DIAGNOSIS — Z96651 Presence of right artificial knee joint: Secondary | ICD-10-CM

## 2023-04-19 NOTE — Telephone Encounter (Signed)
It's for medication clearance to hold her Eliquis x 2 days prior to procedure. Patient reports you are prescribing provider.

## 2023-04-19 NOTE — Telephone Encounter (Signed)
Thanks, please schedule her after 10/19.

## 2023-04-19 NOTE — Telephone Encounter (Signed)
Medical clearance??? I m her orthopedist. I m not her primary care doctor and can not give medical clearance

## 2023-04-19 NOTE — Telephone Encounter (Signed)
Per Dr. Romeo Apple "   Patient will be off Eliquis on October 17

## 2023-04-19 NOTE — Telephone Encounter (Signed)
Dr. Mort Sawyers pt - returned the patient's call, she wants to know if she can have additional PT.

## 2023-04-19 NOTE — Telephone Encounter (Signed)
04/19/23  [name] 07-16-1960  What type of surgery is being performed? Colonoscopy  When is surgery scheduled? TBD  Medication clearance to hold Eliquis x 2 days prior to procedure  Name of physician performing surgery?  Dr. Katrinka [facility] at [facility] Phone: [phone] Fax: [phone]  Anethesia type (none, local, MAC, general)? MAC

## 2023-04-19 NOTE — Telephone Encounter (Signed)
When is the surgery scheduled for.  Patient will be off Eliquis on October 17

## 2023-04-20 ENCOUNTER — Ambulatory Visit: Payer: 59 | Admitting: *Deleted

## 2023-04-20 DIAGNOSIS — M25661 Stiffness of right knee, not elsewhere classified: Secondary | ICD-10-CM

## 2023-04-20 DIAGNOSIS — R600 Localized edema: Secondary | ICD-10-CM

## 2023-04-20 DIAGNOSIS — G8929 Other chronic pain: Secondary | ICD-10-CM

## 2023-04-20 DIAGNOSIS — M25561 Pain in right knee: Secondary | ICD-10-CM | POA: Diagnosis not present

## 2023-04-20 NOTE — Addendum Note (Signed)
Addended byCaffie Damme on: 04/20/2023 08:09 AM   Modules accepted: Orders

## 2023-04-20 NOTE — Therapy (Signed)
OUTPATIENT PHYSICAL THERAPY LOWER EXTREMITY TREATMENT   Patient Name: [name] MRN: [id number] DOB:1961/01/25, 62 y.o., female Today's Date: 04/20/2023  END OF SESSION:  PT End of Session - 04/20/23 0756     Visit Number 13    Number of Visits 16    Date for PT Re-Evaluation 06/06/23    Authorization Type FOTO.    PT Start Time 0800    PT Stop Time 0850    PT Time Calculation (min) 50 min               Past Medical History:  Diagnosis Date   Anxiety    Arthritis    Asthma    Cancer (HCC)    cervical cancer; pt states a long long time ago   CHF (congestive heart failure) (HCC)    Coronary artery disease    Depression    Diabetes mellitus without complication (HCC)    Dyspnea    Edema of all four extremities    Essential (primary) hypertension 06/24/2020   Hyperlipemia    Type II diabetes mellitus (HCC) 03/22/2022   Past Surgical History:  Procedure Laterality Date   ABDOMINAL HYSTERECTOMY     ANTERIOR LAT LUMBAR FUSION N/A 11/05/2019   Procedure: Lumbar Four-Five Anterolateral lumbar interbody fusion with lateral plate;  Surgeon: Barnett Abu, MD;  Location: [facility] OR;  Service: Neurosurgery;  Laterality: N/A;  anterolateral   BACK SURGERY     pins   BACK SURGERY  07/11/2008   BIOPSY  06/29/2021   Procedure: BIOPSY;  Surgeon: Dolores Frame, MD;  Location: [facility];  Service: Gastroenterology;;   BLADDER REPAIR     CARPAL TUNNEL RELEASE Right    CESAREAN SECTION  1982, 1984   COLONOSCOPY N/A 06/12/2013   rehman: normal   CORONARY PRESSURE/FFR STUDY N/A 10/20/2022   Procedure: CORONARY PRESSURE/FFR STUDY;  Surgeon: [name], MD;  Location: [facility];  Service: Cardiovascular;  Laterality: N/A;   CYSTOSCOPY WITH INJECTION N/A 03/23/2023   Procedure: CYSTOSCOPY WITH INJECTION-botox;  Surgeon: Malen Gauze, MD;  Location: [facility];  Service: Urology;  Laterality: N/A;   ESOPHAGOGASTRODUODENOSCOPY (EGD) WITH PROPOFOL  N/A 06/29/2021   Procedure: ESOPHAGOGASTRODUODENOSCOPY (EGD) WITH PROPOFOL;  Surgeon: Dolores Frame, MD;  Location: [facility];  Service: Gastroenterology;  Laterality: N/A;  8:40   KNEE ARTHROSCOPY WITH MEDIAL MENISECTOMY Right 05/09/2019   Procedure: KNEE ARTHROSCOPY WITH MEDIAL MENISECTOMY AND LATERAL MENISECTOMY;  Surgeon: Vickki Hearing, MD;  Location: [facility];  Service: Orthopedics;  Laterality: Right;   KNEE ARTHROSCOPY WITH MEDIAL MENISECTOMY Left 06/27/2019   Procedure: KNEE ARTHROSCOPY WITH MEDIAL MENISCECTOMY;  Surgeon: Vickki Hearing, MD;  Location: [facility];  Service: Orthopedics;  Laterality: Left;   LEFT HEART CATH AND CORONARY ANGIOGRAPHY N/A 10/20/2022   Procedure: LEFT HEART CATH AND CORONARY ANGIOGRAPHY;  Surgeon: [name], MD;  Location: [facility];  Service: Cardiovascular;  Laterality: N/A;   TOTAL KNEE ARTHROPLASTY Left 03/15/2022   Procedure: TOTAL KNEE ARTHROPLASTY;  Surgeon: Vickki Hearing, MD;  Location: [facility];  Service: Orthopedics;  Laterality: Left;   TOTAL KNEE ARTHROPLASTY Right 02/21/2023   Procedure: TOTAL KNEE ARTHROPLASTY;  Surgeon: Vickki Hearing, MD;  Location: [facility];  Service: Orthopedics;  Laterality: Right;   Patient Active Problem List   Diagnosis Date Noted   OAB (overactive bladder) 03/23/2023   S/P total knee replacement, right 02/21/23 03/08/2023   Primary osteoarthritis of right knee  02/21/2023   Lesion of vulva 10/06/2022   Lichen sclerosus et atrophicus 10/06/2022   S/P hysterectomy with oophorectomy 10/06/2022   Hot flashes 10/06/2022   Current use of estrogen therapy 10/06/2022   Anxiety 03/22/2022   Asthma 03/22/2022   Bipolar 1 disorder (HCC) 03/22/2022   Chronic pain 03/22/2022   Edema 03/22/2022   Gastro-esophageal reflux 03/22/2022   Hyperlipidemia 03/22/2022   Insomnia 03/22/2022   Menopausal symptom 03/22/2022   Sinusitis 03/22/2022   Type II diabetes mellitus (HCC) 03/22/2022    Osteoarthritis of left knee 03/15/2022   History of operative procedure on lumbosacral spinal structure 02/15/2022   Urge incontinence 09/02/2021   Cervical radiculopathy 12/30/2020   Pseudoarthrosis of lumbar spine 08/25/2020   Essential (primary) hypertension 06/24/2020   Body mass index (BMI) 45.0-49.9, adult (HCC) 05/06/2020   Carpal tunnel syndrome 04/01/2020   Spondylolisthesis, lumbar region 11/22/2019   Localized swelling of both lower legs 11/12/2019   Lumbar stenosis 11/05/2019   S/P left knee arthroscopy 06/27/2019 07/01/2019   Derangement of posterior horn of medial meniscus of left knee    Primary osteoarthritis of left knee    S/P right knee arthroscopy 05/09/19 05/29/2019   Depression 07/24/2014   LOWER LEG, ARTHRITIS, DEGEN./OSTEO 02/18/2009   DERANGEMENT MENISCUS 02/18/2009   ANSERINE BURSITIS, RIGHT 02/18/2009    REFERRING PROVIDER: Fuller Canada MD  REFERRING DIAG: Right total knee replacement  THERAPY DIAG:  Chronic pain of right knee  Stiffness of right knee, not elsewhere classified  Localized edema  Rationale for Evaluation and Treatment: Rehabilitation  ONSET DATE: 02/21/23 (surgery date).  SUBJECTIVE:   SUBJECTIVE STATEMENT: Patient reports that her knee is a little sore today.  Did okay after last Rx.To MD 04-27-23  PERTINENT HISTORY: Left TKA, CTS, DM. PAIN:  Are you having pain? Yes: NPRS scale: 2/10 Pain location: Right knee. Pain description: Sore Aggravating factors: As above. Relieving factors: As above.  PRECAUTIONS: Other: No ultrasound.   WEIGHT BEARING RESTRICTIONS: No  FALLS:  Has patient fallen in last 6 months? No  LIVING ENVIRONMENT: Lives with: lives with their family Lives in: House/apartment Stairs: 3 external steps.  Non-reciprocating. Has following equipment at home: Dan Humphreys - 2 wheeled  OCCUPATION: Disabled.  PLOF: Independent with basic ADLs  PATIENT GOALS: Not have knee pain and  move  better.   OBJECTIVE:   PATIENT SURVEYS:  FOTO .  EDEMA:  Circumferential: Right 4 cms > left.  PALPATION: C/o diffuse anterior right knee pain.  Post-op bandage is intact.  Visible ecchymosis.  LOWER EXTREMITY ROM:     Active  Right 04/13/23 Left 04/13/23  Hip flexion    Hip extension    Hip abduction    Hip adduction    Hip internal rotation    Hip external rotation    Knee flexion 128 130  Knee extension 7 3  Ankle dorsiflexion    Ankle plantarflexion    Ankle inversion    Ankle eversion     (Blank rows = not tested)   LOWER EXTREMITY MMT: Patient able to perform an antigravity right SLR and SAQ.   GAIT: The patient is walking safely with a FWW with decreases step and streide length.   TODAY'S TREATMENT:  DATE:                                       04/20/23 EXERCISE LOG  Exercise Repetitions and Resistance Comments  Nustep  L3-4 x 17 minutes; seat 10-8    Eccentric heel tap  4" step x 20 reps  RLE on step  Cybex knee extension  20#  x 3 minutes   Cybex knee flexion  40#  x 3 minutes   Rocker board  5 minutes   Step ups Side and forward step ups x20 each    Blank cell = exercise not performed today                                    04/13/23 EXERCISE LOG  Exercise Repetitions and Resistance Comments  Nustep  L4 x 16 minutes; seat 8-7   Cybex knee extension  20# x 3 minutes   Cybex knee flexion  40# x 3 minutes   Rocker board  5 minutes    SLR  20 reps  RLE only   Bridge  20 reps     Blank cell = exercise not performed today                                     04/11/23: EXERCISE LOG   RT TKR  Exercise Repetitions and Resistance Comments  Nustep  Level 4 x 15 minutes   Knee ext 10# x 3 minutes   Ham curls 30# x 3 minutes   Rockerboard In parallel bars x 5 minutes   Zero Knee    LE elevation and vasopneumatic to patient's right  knee x 15 minutes.   Manual PROM for extension 5 mins 04/03/23:  PATIENT EDUCATION:  Education details: Discussed HEP. Person educated: Patient Education method: Explanation Education comprehension: verbalized understanding  HOME EXERCISE PROGRAM:   ASSESSMENT:  CLINICAL IMPRESSION:  Pt arrived today doing fairly well with RT knee with minimal pain and was able to continue with strengthening and balance exs/ act's.Pt did great with mainly fatigue end of session.       OBJECTIVE IMPAIRMENTS: Abnormal gait, decreased activity tolerance, decreased ROM, decreased strength, increased edema, and pain.   ACTIVITY LIMITATIONS: carrying, lifting, standing, bathing, and locomotion level  PARTICIPATION LIMITATIONS: meal prep, cleaning, and laundry  PERSONAL FACTORS: Time since onset of injury/illness/exacerbation are also affecting patient's functional outcome.   REHAB POTENTIAL: Excellent  CLINICAL DECISION MAKING: Stable/uncomplicated  EVALUATION COMPLEXITY: Low   GOALS:  SHORT TERM GOALS: Target date: 03/22/23.  Ind with an initial HEP. Goal status: MET  2.  Full active right knee extension.   9/11: -8 degrees Goal status: MET   LONG TERM GOALS: Target date: 06/06/23  Ind with an advanced HEP.  Goal status: IN PROGRESS  2.  Active right knee flexion to 115 degrees+ so the patient can perform functional tasks and do so with pain not > 2-3/10.  9/11: 122 degrees Goal status: MET  3.  Increase right hip and knee strength to a solid 4+ to 5/5 to provide good stability for accomplishment of functional activities.   Baseline: R hip flexion: 4-/5, R knee extension: 4/5, R knee flexion: 4+/5 Goal status: IN PROGRESS   4.  Perform a reciprocating stair gait with one railing with pain not > 2-3/10.   Baseline: step to pattern ascending and descending Goal status: IN PROGRESS    5.  Patient walk safely with a straight cane.  Goal status: MET  6.  Patient will improve  her five time sit to stand time to 15 second or less for improved lower extremity power.  Baseline: 22.82 seconds without UE support on 04/17/23 Goal status: INITIAL   7.  Patient will improve her timed up and go time to 12 seconds or less to reduce her fall risk.  Baseline: 14.65 seconds on 04/17/23 Goal status: INITIAL   PLAN:  PT FREQUENCY: 2x/week  PT DURATION: 6 weeks  PLANNED INTERVENTIONS: Therapeutic exercises, Therapeutic activity, Neuromuscular re-education, Gait training, Patient/Family education, Self Care, Cryotherapy, Moist heat, Vasopneumatic device, and Manual therapy  PLAN FOR NEXT SESSION: Progress per TKA protocol.  LE elevation and vasopneumatic.   Arnoldo Hildreth,CHRIS, PTA 04/20/2023, 9:32 AM

## 2023-04-20 NOTE — Telephone Encounter (Signed)
Yes, I put in additional orders per Dr Rexene Edison and called her to advise. Unable to leave message

## 2023-04-25 ENCOUNTER — Ambulatory Visit: Payer: 59 | Admitting: Physical Therapy

## 2023-04-25 DIAGNOSIS — R600 Localized edema: Secondary | ICD-10-CM

## 2023-04-25 DIAGNOSIS — M25561 Pain in right knee: Secondary | ICD-10-CM | POA: Diagnosis not present

## 2023-04-25 DIAGNOSIS — M25661 Stiffness of right knee, not elsewhere classified: Secondary | ICD-10-CM

## 2023-04-25 DIAGNOSIS — G8929 Other chronic pain: Secondary | ICD-10-CM

## 2023-04-25 NOTE — Therapy (Signed)
OUTPATIENT PHYSICAL THERAPY LOWER EXTREMITY TREATMENT   Patient Name: [name] MRN: [id number] DOB:[birth year], 62 y.o., female Today's Date: 04/25/2023  END OF SESSION:  PT End of Session - 04/25/23 0809     Visit Number 14    Number of Visits 16    Date for PT Re-Evaluation 06/06/23    Authorization Type FOTO.    PT Start Time [phone]    Activity Tolerance Patient tolerated treatment well    Behavior During Therapy WFL for tasks assessed/performed               Past Medical History:  Diagnosis Date   Anxiety    Arthritis    Asthma    Cancer (HCC)    cervical cancer; pt states a long long time ago   CHF (congestive heart failure) (HCC)    Coronary artery disease    Depression    Diabetes mellitus without complication (HCC)    Dyspnea    Edema of all four extremities    Essential (primary) hypertension 06/24/2020   Hyperlipemia    Type II diabetes mellitus (HCC) 03/22/2022   Past Surgical History:  Procedure Laterality Date   ABDOMINAL HYSTERECTOMY     ANTERIOR LAT LUMBAR FUSION N/A 11/05/2019   Procedure: Lumbar Four-Five Anterolateral lumbar interbody fusion with lateral plate;  Surgeon: Barnett Abu, MD;  Location: [facility];  Service: Neurosurgery;  Laterality: N/A;  anterolateral   BACK SURGERY     pins   BACK SURGERY  07/11/2008   BIOPSY  06/29/2021   Procedure: BIOPSY;  Surgeon: Dolores Frame, MD;  Location: [facility];  Service: Gastroenterology;;   BLADDER REPAIR     CARPAL TUNNEL RELEASE Right    CESAREAN SECTION  1982, 1984   COLONOSCOPY N/A 06/12/2013   rehman: normal   CORONARY PRESSURE/FFR STUDY N/A 10/20/2022   Procedure: CORONARY PRESSURE/FFR STUDY;  Surgeon: [name], MD;  Location: [facility];  Service: Cardiovascular;  Laterality: N/A;   CYSTOSCOPY WITH INJECTION N/A 03/23/2023   Procedure: CYSTOSCOPY WITH INJECTION-botox;  Surgeon: Malen Gauze, MD;  Location: [facility];  Service: Urology;   Laterality: N/A;   ESOPHAGOGASTRODUODENOSCOPY (EGD) WITH PROPOFOL N/A 06/29/2021   Procedure: ESOPHAGOGASTRODUODENOSCOPY (EGD) WITH PROPOFOL;  Surgeon: Dolores Frame, MD;  Location: [facility];  Service: Gastroenterology;  Laterality: N/A;  8:40   KNEE ARTHROSCOPY WITH MEDIAL MENISECTOMY Right 05/09/2019   Procedure: KNEE ARTHROSCOPY WITH MEDIAL MENISECTOMY AND LATERAL MENISECTOMY;  Surgeon: Vickki Hearing, MD;  Location: [facility];  Service: Orthopedics;  Laterality: Right;   KNEE ARTHROSCOPY WITH MEDIAL MENISECTOMY Left 06/27/2019   Procedure: KNEE ARTHROSCOPY WITH MEDIAL MENISCECTOMY;  Surgeon: Vickki Hearing, MD;  Location: [facility];  Service: Orthopedics;  Laterality: Left;   LEFT HEART CATH AND CORONARY ANGIOGRAPHY N/A 10/20/2022   Procedure: LEFT HEART CATH AND CORONARY ANGIOGRAPHY;  Surgeon: [name], MD;  Location: [facility];  Service: Cardiovascular;  Laterality: N/A;   TOTAL KNEE ARTHROPLASTY Left 03/15/2022   Procedure: TOTAL KNEE ARTHROPLASTY;  Surgeon: Vickki Hearing, MD;  Location: [facility];  Service: Orthopedics;  Laterality: Left;   TOTAL KNEE ARTHROPLASTY Right 02/21/2023   Procedure: TOTAL KNEE ARTHROPLASTY;  Surgeon: Vickki Hearing, MD;  Location: [facility];  Service: Orthopedics;  Laterality: Right;   Patient Active Problem List   Diagnosis Date Noted   OAB (overactive bladder) 03/23/2023   S/P total knee replacement, right 02/21/23 03/08/2023   Primary osteoarthritis  of right knee 02/21/2023   Lesion of vulva 10/06/2022   Lichen sclerosus et atrophicus 10/06/2022   S/P hysterectomy with oophorectomy 10/06/2022   Hot flashes 10/06/2022   Current use of estrogen therapy 10/06/2022   Anxiety 03/22/2022   Asthma 03/22/2022   Bipolar 1 disorder (HCC) 03/22/2022   Chronic pain 03/22/2022   Edema 03/22/2022   Gastro-esophageal reflux 03/22/2022   Hyperlipidemia 03/22/2022   Insomnia 03/22/2022   Menopausal symptom 03/22/2022    Sinusitis 03/22/2022   Type II diabetes mellitus (HCC) 03/22/2022   Osteoarthritis of left knee 03/15/2022   History of operative procedure on lumbosacral spinal structure 02/15/2022   Urge incontinence 09/02/2021   Cervical radiculopathy 12/30/2020   Pseudoarthrosis of lumbar spine 08/25/2020   Essential (primary) hypertension 06/24/2020   Body mass index (BMI) 45.0-49.9, adult (HCC) 05/06/2020   Carpal tunnel syndrome 04/01/2020   Spondylolisthesis, lumbar region 11/22/2019   Localized swelling of both lower legs 11/12/2019   Lumbar stenosis 11/05/2019   S/P left knee arthroscopy 06/27/2019 07/01/2019   Derangement of posterior horn of medial meniscus of left knee    Primary osteoarthritis of left knee    S/P right knee arthroscopy 05/09/19 05/29/2019   Depression 07/24/2014   LOWER LEG, ARTHRITIS, DEGEN./OSTEO 02/18/2009   DERANGEMENT MENISCUS 02/18/2009   ANSERINE BURSITIS, RIGHT 02/18/2009    REFERRING PROVIDER: Fuller Canada MD  REFERRING DIAG: Right total knee replacement  THERAPY DIAG:  Chronic pain of right knee  Stiffness of right knee, not elsewhere classified  Localized edema  Rationale for Evaluation and Treatment: Rehabilitation  ONSET DATE: 02/21/23 (surgery date).  SUBJECTIVE:   SUBJECTIVE STATEMENT: Pain at a 2.  PERTINENT HISTORY: Left TKA, CTS, DM. PAIN:  Are you having pain? Yes: NPRS scale: 2/10 Pain location: Right knee. Pain description: Sore Aggravating factors: As above. Relieving factors: As above.  PRECAUTIONS: Other: No ultrasound.   WEIGHT BEARING RESTRICTIONS: No  FALLS:  Has patient fallen in last 6 months? No  LIVING ENVIRONMENT: Lives with: lives with their family Lives in: House/apartment Stairs: 3 external steps.  Non-reciprocating. Has following equipment at home: Dan Humphreys - 2 wheeled  OCCUPATION: Disabled.  PLOF: Independent with basic ADLs  PATIENT GOALS: Not have knee pain and  move better.   OBJECTIVE:    PATIENT SURVEYS:  FOTO .  EDEMA:  Circumferential: Right 4 cms > left.  PALPATION: C/o diffuse anterior right knee pain.  Post-op bandage is intact.  Visible ecchymosis.  LOWER EXTREMITY ROM:     Active  Right 04/13/23 Left 04/13/23  Hip flexion    Hip extension    Hip abduction    Hip adduction    Hip internal rotation    Hip external rotation    Knee flexion 128 130  Knee extension 7 3  Ankle dorsiflexion    Ankle plantarflexion    Ankle inversion    Ankle eversion     (Blank rows = not tested)   LOWER EXTREMITY MMT: Patient able to perform an antigravity right SLR and SAQ.   GAIT: The patient is walking safely with a FWW with decreases step and streide length.   TODAY'S TREATMENT:  DATE:      04/25/23:                                    EXERCISE LOG  Exercise Repetitions and Resistance Comments  Nustep level 4 10 minutes   Recumbent bike Seat 6 progressing to seat 5 x 10 minutes   Knee ext 10# x 3 minutes   Ham curls 40# x 3 minutes       In supine:  PROM to patient's right knee x 5 minutes.                                   04/20/23 EXERCISE LOG  Exercise Repetitions and Resistance Comments  Nustep  L3-4 x 17 minutes; seat 10-8    Eccentric heel tap  4" step x 20 reps  RLE on step  Cybex knee extension  20#  x 3 minutes   Cybex knee flexion  40#  x 3 minutes   Rocker board  5 minutes   Step ups Side and forward step ups x20 each    Blank cell = exercise not performed today                                    04/13/23 EXERCISE LOG  Exercise Repetitions and Resistance Comments  Nustep  L4 x 16 minutes; seat 8-7   Cybex knee extension  20# x 3 minutes   Cybex knee flexion  40# x 3 minutes   Rocker board  5 minutes    SLR  20 reps  RLE only   Bridge  20 reps     Blank cell = exercise not performed today    PATIENT  EDUCATION:  Education details: Discussed HEP. Person educated: Patient Education method: Explanation Education comprehension: verbalized understanding  HOME EXERCISE PROGRAM:   ASSESSMENT:  CLINICAL IMPRESSION:  Patient has done very well.  She has progressed to the recumbent bike and achieved right knee flexion to 120 degrees.  She is now walking without assistive device.  She has two visits remaining.     OBJECTIVE IMPAIRMENTS: Abnormal gait, decreased activity tolerance, decreased ROM, decreased strength, increased edema, and pain.   ACTIVITY LIMITATIONS: carrying, lifting, standing, bathing, and locomotion level  PARTICIPATION LIMITATIONS: meal prep, cleaning, and laundry  PERSONAL FACTORS: Time since onset of injury/illness/exacerbation are also affecting patient's functional outcome.   REHAB POTENTIAL: Excellent  CLINICAL DECISION MAKING: Stable/uncomplicated  EVALUATION COMPLEXITY: Low   GOALS:  SHORT TERM GOALS: Target date: 03/22/23.  Ind with an initial HEP. Goal status: MET  2.  Full active right knee extension.   9/11: -8 degrees Goal status: MET   LONG TERM GOALS: Target date: 06/06/23  Ind with an advanced HEP.  Goal status: IN PROGRESS  2.  Active right knee flexion to 115 degrees+ so the patient can perform functional tasks and do so with pain not > 2-3/10.  9/11: 122 degrees Goal status: MET  3.  Increase right hip and knee strength to a solid 4+ to 5/5 to provide good stability for accomplishment of functional activities.   Baseline: R hip flexion: 4-/5, R knee extension: 4/5, R knee flexion: 4+/5 Goal status: IN PROGRESS   4.  Perform a reciprocating stair gait with one railing  with pain not > 2-3/10.   Baseline: step to pattern ascending and descending Goal status: IN PROGRESS    5.  Patient walk safely with a straight cane.  Goal status: MET  6.  Patient will improve her five time sit to stand time to 15 second or less for improved  lower extremity power.  Baseline: 22.82 seconds without UE support on 04/17/23 Goal status: INITIAL   7.  Patient will improve her timed up and go time to 12 seconds or less to reduce her fall risk.  Baseline: 14.65 seconds on 04/17/23 Goal status: INITIAL   PLAN:  PT FREQUENCY: 2x/week  PT DURATION: 6 weeks  PLANNED INTERVENTIONS: Therapeutic exercises, Therapeutic activity, Neuromuscular re-education, Gait training, Patient/Family education, Self Care, Cryotherapy, Moist heat, Vasopneumatic device, and Manual therapy  PLAN FOR NEXT SESSION: Progress per TKA protocol.  LE elevation and vasopneumatic.   Hazelene Doten, [place], PT 04/25/2023, 8:22 AM

## 2023-04-27 ENCOUNTER — Ambulatory Visit: Payer: 59 | Admitting: Orthopedic Surgery

## 2023-04-27 VITALS — BP 130/76 | HR 76

## 2023-04-27 DIAGNOSIS — M1711 Unilateral primary osteoarthritis, right knee: Secondary | ICD-10-CM

## 2023-04-27 DIAGNOSIS — Z96651 Presence of right artificial knee joint: Secondary | ICD-10-CM | POA: Diagnosis not present

## 2023-04-27 NOTE — Telephone Encounter (Signed)
Pt called wanting to schedule her colonoscopy. Pt states that she is no longer on blood thinner.

## 2023-04-27 NOTE — Progress Notes (Signed)
Postoperative appointment #5 status post right total knee complicated by postoperative edema  Therapy notes indicate range of motion 7 degrees - 128 degrees  Overall swelling in the right leg has decreased but is still fairly significant despite Lasix  Patient did not tolerate compression stockings  Pain is down to 2 out of 10  Pain medications received from the chronic pain management  Looks good so far continue home therapy return in a month for 11-month checkup.

## 2023-05-02 NOTE — Telephone Encounter (Signed)
Left message to return call 

## 2023-05-09 ENCOUNTER — Encounter: Payer: Self-pay | Admitting: Adult Health

## 2023-05-09 ENCOUNTER — Other Ambulatory Visit: Payer: Self-pay | Admitting: Adult Health

## 2023-05-09 ENCOUNTER — Ambulatory Visit: Payer: 59 | Admitting: Adult Health

## 2023-05-09 VITALS — BP 112/64 | HR 72 | Ht 62.0 in | Wt 227.5 lb

## 2023-05-09 DIAGNOSIS — N611 Abscess of the breast and nipple: Secondary | ICD-10-CM

## 2023-05-09 MED ORDER — AMOXICILLIN-POT CLAVULANATE 875-125 MG PO TABS: 1.0000 | ORAL_TABLET | Freq: Two times a day (BID) | ORAL | 0 refills | Status: DC

## 2023-05-09 NOTE — Progress Notes (Signed)
Subjective:     Patient ID: [name], female   DOB: [birth year], 62 y.o.   MRN: [id number]  HPI [name] is a 61 year old white female,single, sp hysterectomy, in complaining of left breast mass, noticed Sunday, it was sore, and it started draining yesterday. She had normal mammogram 10/21/22.  PCP is K. Milinda Cave, NP.  Review of Systems Has left breast mass, it is draining now and sore No known injury  Reviewed past medical,surgical, social and family history. Reviewed medications and allergies.     Objective:   Physical Exam BP 112/64 (BP Location: Left Arm, Patient Position: Sitting, Cuff Size: Normal)   Pulse 72   Ht 5\' 2"  (1.575 m)   Wt 227 lb 8 oz (103.2 kg)   BMI 41.61 kg/m      Skin warm and dry,  Breasts:no dominate palpable mass, retraction or nipple discharge on the right, on the left, no retraction or nipple discharge, has 4 cm area of erythema, 5 -7 o'clock about 4 cm and has area that is draining purulent fluid, without odor, culture obtained, no mass but area feels thick and is tender. Covered with band aide. Examination chaperoned by Malachy Mood LPN  Fall risk is low  Upstream - 05/09/23 0853       Pregnancy Intention Screening   Does the patient want to become pregnant in the next year? N/A    Does the patient's partner want to become pregnant in the next year? N/A    Would the patient like to discuss contraceptive options today? N/A      Contraception Wrap Up   Current Method Female Sterilization   hyst   End Method Female Sterilization   hyst   Contraception Counseling Provided No             Assessment:     1. Left breast abscess +has 4 cm area of erythema, 5 -7 o'clock about 4 cm and has area that is draining purulent fluid, without odor, no mass but area feels thick and is tender. Noticed Sunday   +culture obtained, no mass but area feels thick and is tender. Covered with band aide.   Can use warm compress, but do not squeeze, can massage and  keep covered while draining Will rx Augmentin for 10 days Meds ordered this encounter  Medications   amoxicillin-clavulanate (AUGMENTIN) 875-125 MG tablet    Sig: Take 1 tablet by mouth 2 (two) times daily.    Dispense:  20 tablet    Refill:  0    Order Specific Question:   Supervising Provider    Answer:   Duane Lope H [[id number]]   Decrease smoking  Plan:     Will follow up for recheck of breast 05/19/23  Will also recheck area right vulva near clitoris that was noticed 10/06/22, and she did not get follow up, Dr Charlotta Newton will be here then too

## 2023-05-09 NOTE — Addendum Note (Signed)
Addended by: Colen Darling on: 05/09/2023 10:38 AM   Modules accepted: Orders

## 2023-05-11 LAB — WOUND CULTURE

## 2023-05-13 LAB — WOUND CULTURE: Organism ID, Bacteria: NONE SEEN

## 2023-05-19 ENCOUNTER — Encounter: Payer: Self-pay | Admitting: Adult Health

## 2023-05-19 ENCOUNTER — Ambulatory Visit (INDEPENDENT_AMBULATORY_CARE_PROVIDER_SITE_OTHER): Payer: 59 | Admitting: Adult Health

## 2023-05-19 VITALS — BP 116/65 | HR 78 | Ht 62.0 in | Wt 227.5 lb

## 2023-05-19 DIAGNOSIS — B369 Superficial mycosis, unspecified: Secondary | ICD-10-CM | POA: Insufficient documentation

## 2023-05-19 DIAGNOSIS — N9089 Other specified noninflammatory disorders of vulva and perineum: Secondary | ICD-10-CM | POA: Diagnosis not present

## 2023-05-19 DIAGNOSIS — L900 Lichen sclerosus et atrophicus: Secondary | ICD-10-CM

## 2023-05-19 DIAGNOSIS — N611 Abscess of the breast and nipple: Secondary | ICD-10-CM | POA: Insufficient documentation

## 2023-05-19 MED ORDER — NYSTATIN 100000 UNIT/GM EX OINT: 1.0000 | TOPICAL_OINTMENT | Freq: Two times a day (BID) | CUTANEOUS | 3 refills | Status: DC

## 2023-05-19 MED ORDER — CLOBETASOL PROPIONATE 0.05 % EX OINT: TOPICAL_OINTMENT | CUTANEOUS | 3 refills | Status: DC

## 2023-05-19 NOTE — Progress Notes (Signed)
  Subjective:     Patient ID: [name], female   DOB: January 16, 1961, 62 y.o.   MRN: [id number]  HPI [name] is a 62 year old white female, single, sp hysterectomy back in follow up on breast abscess and it is much better, and to recheck dark area right labia.  PCP is Susan Palms NP  Review of Systems Breast is better Reviewed past medical,surgical, social and family history. Reviewed medications and allergies.     Objective:   Physical Exam BP 116/65 (BP Location: Left Arm, Patient Position: Sitting, Cuff Size: Normal)   Pulse 78   Ht 5\' 2"  (1.575 m)   Wt 227 lb 8 oz (103.2 kg)   BMI 41.61 kg/m      Skin warm and dry,  Breasts:no dominate palpable mass, retraction or nipple discharge, abscess has resolve left breast, still has some skin discoloration at about 6 o'clock, non tender. Pelvic: external genitalia is normal in appearance, right groin is red, looks like yeast, has 1 cm purple nodule inner labia, no change, Dr [name] in for co-exam, has white thickened skin at clitoral area.  [facility] - 05/19/23 1038       Pregnancy Intention Screening   Does the patient want to become pregnant in the next year? N/A    Does the patient's partner want to become pregnant in the next year? N/A    Would the patient like to discuss contraceptive options today? N/A      Contraception Wrap Up   Current Method Female Sterilization   hyst   End Method Female Sterilization   hyst   Contraception Counseling Provided No            Examination chaperoned by Susan Mood LPN   Assessment:     1. Left breast abscess Resolved  2. Lesion of vulva 1 cm purple nodule inner labia, stable  3. Lichen sclerosus et atrophicus +thickened white skin at clitoral area, will refill temovate to use 2 x weekly   4. Superficial fungus infection of skin Has redness right groin Will rx nystatin ointment to use bid  Meds ordered this encounter  Medications   clobetasol ointment (TEMOVATE) 0.05 %     Sig: Use 2 x weekly to affected area    Dispense:  30 g    Refill:  3    Order Specific Question:   Supervising Provider    Answer:   Duane Lope H [[id number]]   nystatin ointment (MYCOSTATIN)    Sig: Apply 1 Application topically 2 (two) times daily.    Dispense:  30 g    Refill:  3    Order Specific Question:   Supervising Provider    Answer:   Lazaro Arms [[id number]]       Plan:     Follow up in 4 months or sooner if needed

## 2023-05-22 ENCOUNTER — Ambulatory Visit (INDEPENDENT_AMBULATORY_CARE_PROVIDER_SITE_OTHER): Payer: 59 | Admitting: Orthopedic Surgery

## 2023-05-22 VITALS — BP 118/66 | HR 62 | Ht 62.0 in | Wt 225.0 lb

## 2023-05-22 DIAGNOSIS — Z96651 Presence of right artificial knee joint: Secondary | ICD-10-CM | POA: Diagnosis not present

## 2023-05-22 NOTE — Progress Notes (Signed)
Follow-up visit  Chief complaint recheck right knee date of surgery 02/21/2023 status post right total knee  [name] continues to improve she has some stiffness if she is sitting for a long time this resolves as she walks  She has excellent range of motion in her knee she has full extension close to it maybe a couple degrees shy and her flexion arc is from that 2-120 degrees  Knee is stable.  Her legs continue to swell from her chronic heart failure but she is walking without a cane most of her arthritic pain is gone  She still has some residual discomfort from her back disease  Follow-up 3 months

## 2023-05-26 NOTE — Telephone Encounter (Signed)
Pt left voicemail stating "yes this is [name] birthday 13[birth year] month 10th day 1962 I am trying to make appt to get my colonoscopy  done I'm tired of fooling with yall. Yall please give me a call back at [phone] as soon as possible. Thank you. Bye."  Contacted pt and advised her that the only day we have for Dr.Castaneda would be 07/11/23. Pt is wanting to know if she can be switched to Dr.Ahmed so she can get TCS done quicker. Please advise. Thank you

## 2023-05-30 NOTE — Telephone Encounter (Signed)
Ok with me, as long as Dr. Tasia Catchings is Ok to accept her as a new patient of his.

## 2023-05-31 MED ORDER — PEG 3350-KCL-NA BICARB-NACL 420 G PO SOLR: 4000.0000 mL | Freq: Once | ORAL | 0 refills | Status: AC

## 2023-05-31 NOTE — Telephone Encounter (Signed)
Pt left voicemail stating that she was calling back since I did not call her back on Monday. I advised the patient last week when I spoke with her, I would get back to her once I heard from providers.  Contacted pt and went ahead and scheduled her for 06/27/23 at 2:00pm with Dr.Ahmed. Prep sent to pharmacy. Instructions will be mailed once pre op appt is received.   Per [employer]: Notification or Prior Authorization is not required for the requested services You are not required to submit a notification/prior authorization based on the information provided. If you have general questions about the prior authorization requirements, visit [email or web address] > Clinician Resources > Advance and Admission Notification Requirements. The number above acknowledges your notification. Please write this reference number down for future reference. If you would like to request an organization determination, please call us at [phone]. Decision ID #: [id number]

## 2023-05-31 NOTE — Addendum Note (Signed)
Addended by: Marlowe Shores on: 05/31/2023 10:40 AM   Modules accepted: Orders

## 2023-05-31 NOTE — Telephone Encounter (Signed)
Referral was closed in October

## 2023-06-05 ENCOUNTER — Encounter (INDEPENDENT_AMBULATORY_CARE_PROVIDER_SITE_OTHER): Payer: Self-pay | Admitting: *Deleted

## 2023-06-05 DIAGNOSIS — I509 Heart failure, unspecified: Secondary | ICD-10-CM | POA: Insufficient documentation

## 2023-06-12 ENCOUNTER — Ambulatory Visit (INDEPENDENT_AMBULATORY_CARE_PROVIDER_SITE_OTHER): Payer: 59 | Admitting: Orthopedic Surgery

## 2023-06-12 DIAGNOSIS — Z96651 Presence of right artificial knee joint: Secondary | ICD-10-CM

## 2023-06-12 DIAGNOSIS — Z96652 Presence of left artificial knee joint: Secondary | ICD-10-CM | POA: Diagnosis not present

## 2023-06-12 NOTE — Progress Notes (Signed)
   VISIT TYPE: FOLLOW UP   Chief Complaint  Patient presents with   Follow-up    S/p total knee replacement, right 02/21/23  (primary encounter diagnosis) 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     Encounter Diagnoses  Name Primary?   S/P total knee replacement, right 02/21/23 Yes   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     Assessment and Plan: 62 year old female doing well has some intermittent pain and swelling tends to carry fluid from her CHF  Already on fluid pills overall doing well  Return in 6 months x-ray both total knees for annuals even though it is early    There were no vitals taken for this visit.  Right Knee Exam   Muscle Strength  The patient has normal right knee strength.  Range of Motion  Extension:  normal  Right knee flexion: 115.   Tests  Drawer:  Anterior - negative    Posterior - negative  Comments:  Normal gait  Lots of edema right and left leg right greater than left      A/P Encounter Diagnoses  Name Primary?   S/P total knee replacement, right 02/21/23 Yes   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     No orders of the defined types were placed in this encounter.

## 2023-06-21 NOTE — Patient Instructions (Signed)
[name]  06/21/2023     @PREFPERIOPPHARMACY @   Your procedure is scheduled on  06/27/2023.   Report to [facility] at  1200  P.M.   Call this number if you have problems the morning of surgery:  [phone]  If you experience any cold or flu symptoms such as cough, fever, chills, shortness of breath, etc. between now and your scheduled surgery, please notify us at the above number.   Remember:         Your last dose of synjardy should be on 06/23/2023.        DO NOT take any medications for diabetes the morning of your procedure.   Follow the diet and prep instructions given to you by the office.   You may drink clear liquids until 1000 am on 06/27/2023.    Clear liquids allowed are:                    Water, Juice (No red color; non-citric and without pulp; diabetics please choose diet or no sugar options), Carbonated beverages (diabetics please choose diet or no sugar options), Clear Tea (No creamer, milk, or cream, including half & half and powdered creamer), Black Coffee Only (No creamer, milk or cream, including half & half and powdered creamer), and Clear Sports drink (No red color; diabetics please choose diet or no sugar options)    Take these medicines the morning of surgery with A SIP OF WATER      abilify, fluoxetine, gabapentin, methocarbamol(if needed), metoprolol, omeprazole, oxycodone or tramadol(if needed).     Do not wear jewelry, make-up or nail polish, including gel polish,  artificial nails, or any other type of covering on natural nails (fingers and  toes).  Do not wear lotions, powders, or perfumes, or deodorant.  Do not shave 48 hours prior to surgery.  Men may shave face and neck.  Do not bring valuables to the hospital.  [facility] is not responsible for any belongings or valuables.  Contacts, dentures or bridgework may not be worn into surgery.  Leave your suitcase in the car.  After surgery it may be brought to your  room.  For patients admitted to the hospital, discharge time will be determined by your treatment team.  Patients discharged the day of surgery will not be allowed to drive home and must have someone with them for 24 hours.    Special instructions:   DO NOT smoke tobacco or vape for 24 hours before your procedure.  Please read over the following fact sheets that you were given. Anesthesia Post-op Instructions and Care and Recovery After Surgery      Colonoscopy, Adult, Care After The following information offers guidance on how to care for yourself after your procedure. Your health care provider may also give you more specific instructions. If you have problems or questions, contact your health care provider. What can I expect after the procedure? After the procedure, it is common to have: A small amount of blood in your stool for 24 hours after the procedure. Some gas. Mild cramping or bloating of your abdomen. Follow these instructions at home: Eating and drinking  Drink enough fluid to keep your urine pale yellow. Follow instructions from your health care provider about eating or drinking restrictions. Resume your normal diet as told by your health care provider. Avoid heavy or fried foods that are hard to digest. Activity Rest as told by your health  care provider. Avoid sitting for a long time without moving. Get up to take short walks every 1-2 hours. This is important to improve blood flow and breathing. Ask for help if you feel weak or unsteady. Return to your normal activities as told by your health care provider. Ask your health care provider what activities are safe for you. Managing cramping and bloating  Try walking around when you have cramps or feel bloated. If directed, apply heat to your abdomen as told by your health care provider. Use the heat source that your health care provider recommends, such as a moist heat pack or a heating pad. Place a towel between your  skin and the heat source. Leave the heat on for 20-30 minutes. Remove the heat if your skin turns bright red. This is especially important if you are unable to feel pain, heat, or cold. You have a greater risk of getting burned. General instructions If you were given a sedative during the procedure, it can affect you for several hours. Do not drive or operate machinery until your health care provider says that it is safe. For the first 24 hours after the procedure: Do not sign important documents. Do not drink alcohol. Do your regular daily activities at a slower pace than normal. Eat soft foods that are easy to digest. Take over-the-counter and prescription medicines only as told by your health care provider. Keep all follow-up visits. This is important. Contact a health care provider if: You have blood in your stool 2-3 days after the procedure. Get help right away if: You have more than a small spotting of blood in your stool. You have large blood clots in your stool. You have swelling of your abdomen. You have nausea or vomiting. You have a fever. You have increasing pain in your abdomen that is not relieved with medicine. These symptoms may be an emergency. Get help right away. Call 911. Do not wait to see if the symptoms will go away. Do not drive yourself to the hospital. Summary After the procedure, it is common to have a small amount of blood in your stool. You may also have mild cramping and bloating of your abdomen. If you were given a sedative during the procedure, it can affect you for several hours. Do not drive or operate machinery until your health care provider says that it is safe. Get help right away if you have a lot of blood in your stool, nausea or vomiting, a fever, or increased pain in your abdomen. This information is not intended to replace advice given to you by your health care provider. Make sure you discuss any questions you have with your health care  provider. Document Revised: 08/09/2022 Document Reviewed: 02/17/2021 Elsevier Patient Education  2024 [employer] Monitored Anesthesia Care, Care After The following information offers guidance on how to care for yourself after your procedure. Your health care provider may also give you more specific instructions. If you have problems or questions, contact your health care provider. What can I expect after the procedure? After the procedure, it is common to have: Tiredness. Little or no memory about what happened during or after the procedure. Impaired judgment when it comes to making decisions. Nausea or vomiting. Some trouble with balance. Follow these instructions at home: For the time period you were told by your health care provider:  Rest. Do not participate in activities where you could fall or become injured. Do not drive or use machinery. Do not drink  alcohol. Do not take sleeping pills or medicines that cause drowsiness. Do not make important decisions or sign legal documents. Do not take care of children on your own. Medicines Take over-the-counter and prescription medicines only as told by your health care provider. If you were prescribed antibiotics, take them as told by your health care provider. Do not stop using the antibiotic even if you start to feel better. Eating and drinking Follow instructions from your health care provider about what you may eat and drink. Drink enough fluid to keep your urine pale yellow. If you vomit: Drink clear fluids slowly and in small amounts as you are able. Clear fluids include water, ice chips, low-calorie sports drinks, and fruit juice that has water added to it (diluted fruit juice). Eat light and bland foods in small amounts as you are able. These foods include bananas, applesauce, rice, lean meats, toast, and crackers. General instructions  Have a responsible adult stay with you for the time you are told. It is important to  have someone help care for you until you are awake and alert. If you have sleep apnea, surgery and some medicines can increase your risk for breathing problems. Follow instructions from your health care provider about wearing your sleep device: When you are sleeping. This includes during daytime naps. While taking prescription pain medicines, sleeping medicines, or medicines that make you drowsy. Do not use any products that contain nicotine or tobacco. These products include cigarettes, chewing tobacco, and vaping devices, such as e-cigarettes. If you need help quitting, ask your health care provider. Contact a health care provider if: You feel nauseous or vomit every time you eat or drink. You feel light-headed. You are still sleepy or having trouble with balance after 24 hours. You get a rash. You have a fever. You have redness or swelling around the IV site. Get help right away if: You have trouble breathing. You have new confusion after you get home. These symptoms may be an emergency. Get help right away. Call 911. Do not wait to see if the symptoms will go away. Do not drive yourself to the hospital. This information is not intended to replace advice given to you by your health care provider. Make sure you discuss any questions you have with your health care provider. Document Revised: 11/22/2021 Document Reviewed: 11/22/2021 Elsevier Patient Education  2024 [employer].

## 2023-06-22 ENCOUNTER — Encounter (HOSPITAL_COMMUNITY)
Admission: RE | Admit: 2023-06-22 | Discharge: 2023-06-22 | Disposition: A | Discharge status: Home / Self Care | Payer: 59 | Source: Ambulatory Visit | Attending: Gastroenterology | Admitting: Gastroenterology

## 2023-06-22 VITALS — BP 130/60 | HR 71 | Temp 97.8°F | Resp 18 | Ht 62.0 in | Wt 225.1 lb

## 2023-06-22 DIAGNOSIS — E119 Type 2 diabetes mellitus without complications: Secondary | ICD-10-CM | POA: Diagnosis not present

## 2023-06-22 DIAGNOSIS — D649 Anemia, unspecified: Secondary | ICD-10-CM | POA: Insufficient documentation

## 2023-06-22 DIAGNOSIS — Z01812 Encounter for preprocedural laboratory examination: Secondary | ICD-10-CM | POA: Insufficient documentation

## 2023-06-22 LAB — CBC WITH DIFFERENTIAL/PLATELET
Abs Immature Granulocytes: 0.03 10*3/uL (ref 0.00–0.07)
Basophils Absolute: 0.1 10*3/uL (ref 0.0–0.1)
Basophils Relative: 1 %
Eosinophils Absolute: 0.1 10*3/uL (ref 0.0–0.5)
Eosinophils Relative: 1 %
HCT: 35.7 % — ABNORMAL LOW (ref 36.0–46.0)
Hemoglobin: 10.7 g/dL — ABNORMAL LOW (ref 12.0–15.0)
Immature Granulocytes: 0 %
Lymphocytes Relative: 17 %
Lymphs Abs: 1.5 10*3/uL (ref 0.7–4.0)
MCH: 22.2 pg — ABNORMAL LOW (ref 26.0–34.0)
MCHC: 30 g/dL (ref 30.0–36.0)
MCV: 74.2 fL — ABNORMAL LOW (ref 80.0–100.0)
Monocytes Absolute: 0.6 10*3/uL (ref 0.1–1.0)
Monocytes Relative: 7 %
Neutro Abs: 6.4 10*3/uL (ref 1.7–7.7)
Neutrophils Relative %: 74 %
Platelets: 328 10*3/uL (ref 150–400)
RBC: 4.81 MIL/uL (ref 3.87–5.11)
RDW: 19.6 % — ABNORMAL HIGH (ref 11.5–15.5)
WBC: 8.6 10*3/uL (ref 4.0–10.5)
nRBC: 0 % (ref 0.0–0.2)

## 2023-06-22 LAB — BASIC METABOLIC PANEL
Anion gap: 11 (ref 5–15)
BUN: 8 mg/dL (ref 8–23)
CO2: 24 mmol/L (ref 22–32)
Calcium: 9.4 mg/dL (ref 8.9–10.3)
Chloride: 103 mmol/L (ref 98–111)
Creatinine, Ser: 0.72 mg/dL (ref 0.44–1.00)
GFR, Estimated: >60 mL/min (ref >60)
Glucose, Bld: 87 mg/dL (ref 70–99)
Potassium: 4.2 mmol/L (ref 3.5–5.1)
Sodium: 138 mmol/L (ref 135–145)

## 2023-06-27 ENCOUNTER — Encounter (HOSPITAL_COMMUNITY)
Admission: RE | Disposition: A | Discharge status: Home / Self Care | Payer: Self-pay | Source: Home / Self Care | Attending: Gastroenterology

## 2023-06-27 ENCOUNTER — Encounter (HOSPITAL_COMMUNITY): Payer: Self-pay

## 2023-06-27 ENCOUNTER — Ambulatory Visit (HOSPITAL_COMMUNITY)
Admission: RE | Admit: 2023-06-27 | Discharge: 2023-06-27 | Disposition: A | Discharge status: Home / Self Care | Payer: 59 | Attending: Gastroenterology | Admitting: Gastroenterology

## 2023-06-27 ENCOUNTER — Ambulatory Visit (HOSPITAL_COMMUNITY): Payer: 59 | Admitting: Anesthesiology

## 2023-06-27 ENCOUNTER — Ambulatory Visit (HOSPITAL_BASED_OUTPATIENT_CLINIC_OR_DEPARTMENT_OTHER): Payer: 59 | Admitting: Anesthesiology

## 2023-06-27 DIAGNOSIS — J45909 Unspecified asthma, uncomplicated: Secondary | ICD-10-CM | POA: Insufficient documentation

## 2023-06-27 DIAGNOSIS — D122 Benign neoplasm of ascending colon: Secondary | ICD-10-CM | POA: Insufficient documentation

## 2023-06-27 DIAGNOSIS — Z1211 Encounter for screening for malignant neoplasm of colon: Secondary | ICD-10-CM | POA: Insufficient documentation

## 2023-06-27 DIAGNOSIS — Z87891 Personal history of nicotine dependence: Secondary | ICD-10-CM | POA: Diagnosis not present

## 2023-06-27 DIAGNOSIS — D125 Benign neoplasm of sigmoid colon: Secondary | ICD-10-CM | POA: Diagnosis not present

## 2023-06-27 DIAGNOSIS — K219 Gastro-esophageal reflux disease without esophagitis: Secondary | ICD-10-CM | POA: Insufficient documentation

## 2023-06-27 DIAGNOSIS — I509 Heart failure, unspecified: Secondary | ICD-10-CM | POA: Insufficient documentation

## 2023-06-27 DIAGNOSIS — K644 Residual hemorrhoidal skin tags: Secondary | ICD-10-CM | POA: Diagnosis not present

## 2023-06-27 DIAGNOSIS — I2510 Atherosclerotic heart disease of native coronary artery without angina pectoris: Secondary | ICD-10-CM | POA: Insufficient documentation

## 2023-06-27 DIAGNOSIS — I110 Hypertensive heart disease with heart failure: Secondary | ICD-10-CM | POA: Insufficient documentation

## 2023-06-27 DIAGNOSIS — K648 Other hemorrhoids: Secondary | ICD-10-CM | POA: Diagnosis not present

## 2023-06-27 DIAGNOSIS — E119 Type 2 diabetes mellitus without complications: Secondary | ICD-10-CM | POA: Diagnosis not present

## 2023-06-27 HISTORY — PX: COLONOSCOPY WITH PROPOFOL: SHX5780

## 2023-06-27 HISTORY — PX: POLYPECTOMY: SHX5525

## 2023-06-27 LAB — GLUCOSE, CAPILLARY: Glucose-Capillary: 94 mg/dL (ref 70–99)

## 2023-06-27 LAB — HM COLONOSCOPY

## 2023-06-27 SURGERY — COLONOSCOPY WITH PROPOFOL
Anesthesia: General

## 2023-06-27 MED ORDER — PROPOFOL 10 MG/ML IV BOLUS
INTRAVENOUS | Status: DC | PRN
  Administered 2023-06-27: 50 mg via INTRAVENOUS
  Administered 2023-06-27: 100 mg via INTRAVENOUS
  Administered 2023-06-27: 50 mg via INTRAVENOUS

## 2023-06-27 MED ORDER — LACTATED RINGERS IV SOLN: INTRAVENOUS | Status: DC

## 2023-06-27 MED ORDER — LIDOCAINE HCL (PF) 2 % IJ SOLN
INTRAMUSCULAR | Status: DC | PRN
  Administered 2023-06-27: 50 mg via INTRADERMAL

## 2023-06-27 MED ORDER — PROPOFOL 500 MG/50ML IV EMUL
INTRAVENOUS | Status: DC | PRN
  Administered 2023-06-27: 150 ug/kg/min via INTRAVENOUS

## 2023-06-27 NOTE — Anesthesia Procedure Notes (Signed)
Date/Time: 06/27/2023 12:50 PM  Performed by: Julian Reil, CRNAPre-anesthesia Checklist: Patient identified, Emergency Drugs available, Suction available and Patient being monitored Patient Re-evaluated:Patient Re-evaluated prior to induction Oxygen Delivery Method: Nasal cannula Induction Type: IV induction Placement Confirmation: positive ETCO2 Comments: Optiflow High Flow [place] O2 used

## 2023-06-27 NOTE — Anesthesia Preprocedure Evaluation (Signed)
Anesthesia Evaluation  Patient identified by MRN, date of birth, ID band Patient awake    Reviewed: Allergy & Precautions, H&P , NPO status , Patient's Chart, lab work & pertinent test results, reviewed documented beta blocker date and time   Airway Mallampati: II  TM Distance: >3 FB Neck ROM: full    Dental no notable dental hx.    Pulmonary neg pulmonary ROS, shortness of breath, asthma , former smoker   Pulmonary exam normal breath sounds clear to auscultation       Cardiovascular Exercise Tolerance: Good hypertension, + CAD and +CHF  negative cardio ROS  Rhythm:regular Rate:Normal     Neuro/Psych  PSYCHIATRIC DISORDERS Anxiety Depression Bipolar Disorder    Neuromuscular disease negative neurological ROS  negative psych ROS   GI/Hepatic negative GI ROS, Neg liver ROS,GERD  ,,  Endo/Other  negative endocrine ROSdiabetes    Renal/GU negative Renal ROS  negative genitourinary   Musculoskeletal   Abdominal   Peds  Hematology negative hematology ROS (+)   Anesthesia Other Findings   Reproductive/Obstetrics negative OB ROS                             Anesthesia Physical Anesthesia Plan  ASA: 3  Anesthesia Plan: General   Post-op Pain Management:    Induction:   PONV Risk Score and Plan: Propofol infusion  Airway Management Planned:   Additional Equipment:   Intra-op Plan:   Post-operative Plan:   Informed Consent: I have reviewed the patients History and Physical, chart, labs and discussed the procedure including the risks, benefits and alternatives for the proposed anesthesia with the patient or authorized representative who has indicated his/her understanding and acceptance.     Dental Advisory Given  Plan Discussed with: CRNA  Anesthesia Plan Comments:        Anesthesia Quick Evaluation

## 2023-06-27 NOTE — H&P (Signed)
Primary Care Physician:  Rebekah Chesterfield, NP Primary Gastroenterologist:  Dr. Tasia Catchings  Pre-Procedure History & Physical: HPI:  [name] is a 62 y.o. female is here for a colonoscopy for colon cancer screening purposes.  Patient denies any family history of colorectal cancer.  No melena or hematochezia.  No abdominal pain or unintentional weight loss.  No change in bowel habits.  Overall feels well from a GI standpoint.  Last colonoscopy 2014 Findings:   Prep satisfactory. Normal mucosa of terminal ileum. Few small diverticula at ascending colon. Normal rectal mucosa and anal rectal junction. Impression:  Few small diverticula at ascending colon otherwise normal colonoscopy. Normal mucosa of terminal ileum.   Recommendations:  Standard instructions given. Next screening exam in 10 years. Past Medical History:  Diagnosis Date   Anxiety    Arthritis    Asthma    Cancer (HCC)    cervical cancer; pt states a long long time ago   CHF (congestive heart failure) (HCC)    Coronary artery disease    Depression    Diabetes mellitus without complication (HCC)    Dyspnea    Edema of all four extremities    Essential (primary) hypertension 06/24/2020   Hyperlipemia    Type II diabetes mellitus (HCC) 03/22/2022    Past Surgical History:  Procedure Laterality Date   ABDOMINAL HYSTERECTOMY     ANTERIOR LAT LUMBAR FUSION N/A 11/05/2019   Procedure: Lumbar Four-Five Anterolateral lumbar interbody fusion with lateral plate;  Surgeon: Barnett Abu, MD;  Location: [facility] OR;  Service: Neurosurgery;  Laterality: N/A;  anterolateral   BACK SURGERY     pins   BACK SURGERY  07/11/2008   BIOPSY  06/29/2021   Procedure: BIOPSY;  Surgeon: Dolores Frame, MD;  Location: [facility];  Service: Gastroenterology;;   BLADDER REPAIR     CARPAL TUNNEL RELEASE Right    CESAREAN SECTION  1982, 1984   COLONOSCOPY N/A 06/12/2013   rehman: normal   CORONARY PRESSURE/FFR STUDY N/A  10/20/2022   Procedure: CORONARY PRESSURE/FFR STUDY;  Surgeon: [name], MD;  Location: [facility];  Service: Cardiovascular;  Laterality: N/A;   CYSTOSCOPY WITH INJECTION N/A 03/23/2023   Procedure: CYSTOSCOPY WITH INJECTION-botox;  Surgeon: Malen Gauze, MD;  Location: [facility] OR[facility];  Service: Urology;  Laterality: N/A;   ESOPHAGOGASTRODUODENOSCOPY (EGD) WITH PROPOFOL N/A 06/29/2021   Procedure: ESOPHAGOGASTRODUODENOSCOPY (EGD) WITH PROPOFOL;  Surgeon: Dolores Frame, MD;  Location: [facility];  Service: Gastroenterology;  Laterality: N/A;  8:40   KNEE ARTHROSCOPY WITH MEDIAL MENISECTOMY Right 05/09/2019   Procedure: KNEE ARTHROSCOPY WITH MEDIAL MENISECTOMY AND LATERAL MENISECTOMY;  Surgeon: Vickki Hearing, MD;  Location: [facility];  Service: Orthopedics;  Laterality: Right;   KNEE ARTHROSCOPY WITH MEDIAL MENISECTOMY Left 06/27/2019   Procedure: KNEE ARTHROSCOPY WITH MEDIAL MENISCECTOMY;  Surgeon: Vickki Hearing, MD;  Location: [facility];  Service: Orthopedics;  Laterality: Left;   LEFT HEART CATH AND CORONARY ANGIOGRAPHY N/A 10/20/2022   Procedure: LEFT HEART CATH AND CORONARY ANGIOGRAPHY;  Surgeon: [name], MD;  Location: [facility];  Service: Cardiovascular;  Laterality: N/A;   TOTAL KNEE ARTHROPLASTY Left 03/15/2022   Procedure: TOTAL KNEE ARTHROPLASTY;  Surgeon: Vickki Hearing, MD;  Location: [facility];  Service: Orthopedics;  Laterality: Left;   TOTAL KNEE ARTHROPLASTY Right 02/21/2023   Procedure: TOTAL KNEE ARTHROPLASTY;  Surgeon: Vickki Hearing, MD;  Location: [facility];  Service: Orthopedics;  Laterality: Right;  Prior to Admission medications   Medication Sig Start Date End Date Taking? Authorizing Provider  albuterol (VENTOLIN HFA) 108 (90 Base) MCG/ACT inhaler Inhale 1-2 puffs into the lungs every 6 (six) hours as needed for wheezing or shortness of breath.    [provider]  amoxicillin-clavulanate (AUGMENTIN) 875-125  MG tablet Take 1 tablet by mouth 2 (two) times daily. 05/09/23   Adline Potter, NP  ARIPiprazole (ABILIFY) 2 MG tablet Take 2 mg by mouth daily. 10/10/22   [provider]  aspirin 81 MG chewable tablet Chew 1 tablet (81 mg total) by mouth 2 (two) times daily. 02/22/23   Vickki Hearing, MD  atorvastatin (LIPITOR) 80 MG tablet Take 1 tablet (80 mg total) by mouth daily. Patient taking differently: Take 40 mg by mouth at bedtime. 10/17/22 10/12/23  O'NealRonnald Ramp, MD  bisacodyl (DULCOLAX) 5 MG EC tablet Take 1 tablet (5 mg total) by mouth daily as needed for moderate constipation. 02/22/23   Vickki Hearing, MD  celecoxib (CELEBREX) 100 MG capsule Take 1 capsule (100 mg total) by mouth 2 (two) times daily. 03/09/23   Vickki Hearing, MD  clobetasol ointment (TEMOVATE) 0.05 % Use 2 x weekly to affected area 05/19/23   Adline Potter, NP  Empagliflozin-metFORMIN HCl ER (SYNJARDY XR) 12.11-998 MG TB24 Take 1 tablet by mouth daily.    [provider]  estradiol (ESTRACE) 2 MG tablet TAKE ONE TABLET AT BEDTIME 01/04/23   Lazaro Arms, MD  FLUoxetine (PROZAC) 40 MG capsule Take 40 mg by mouth daily. 12/06/22   [provider]  furosemide (LASIX) 40 MG tablet Take 40 mg by mouth 2 (two) times daily. 04/10/23   [provider]  gabapentin (NEURONTIN) 300 MG capsule Take 1 capsule (300 mg total) by mouth 3 (three) times daily. 08/16/19   Vickki Hearing, MD  loratadine (CLARITIN) 10 MG tablet Take 10 mg by mouth daily.    [provider]  methocarbamol (ROBAXIN) 500 MG tablet Take 1 tablet (500 mg total) by mouth every 6 (six) hours as needed for muscle spasms. 03/09/23   Vickki Hearing, MD  metoprolol tartrate (LOPRESSOR) 25 MG tablet Take 1 tablet (25 mg total) by mouth 2 (two) times daily. 10/17/22 10/12/23  O'NealRonnald Ramp, MD  nitroGLYCERIN (NITROSTAT) 0.4 MG SL tablet Place 1 tablet (0.4 mg total) under the tongue every 5 (five)  minutes as needed for chest pain. 10/17/22 04/18/23  O'NealRonnald Ramp, MD  nystatin ointment (MYCOSTATIN) Apply 1 Application topically 2 (two) times daily. 05/19/23   Adline Potter, NP  omeprazole (PRILOSEC) 40 MG capsule Take 1 capsule (40 mg total) by mouth daily. 06/29/21   Dolores Frame, MD  oxyCODONE-acetaminophen (PERCOCET) 7.5-325 MG tablet Take 1 tablet by mouth 4 (four) times daily. 05/19/23   [provider]  oxymetazoline (AFRIN) 0.05 % nasal spray Place 1 spray into both nostrils 2 (two) times daily as needed for congestion.    [provider]  potassium chloride SA (KLOR-CON M20) 20 MEQ tablet Take 1 tablet (20 mEq total) by mouth daily. 11/18/22 04/18/23  O'NealRonnald Ramp, MD  spironolactone (ALDACTONE) 25 MG tablet Take 1 tablet (25 mg total) by mouth daily. 11/18/22 04/18/23  Sande Rives, MD  torsemide (DEMADEX) 20 MG tablet Take 2 tablets (40 mg total) by mouth daily. 01/19/23 04/19/23  O'NealRonnald Ramp, MD  traMADol (ULTRAM) 50 MG tablet Take 1 tablet (50 mg total) by  mouth every 6 (six) hours as needed. 03/23/23 03/22/24  Malen Gauze, MD  traZODone (DESYREL) 100 MG tablet Take 200 mg by mouth at bedtime.    [provider]    Allergies as of 05/31/2023   (No Known Allergies)    Family History  Problem Relation Age of Onset   Cancer Paternal Grandfather    Cancer Paternal Grandmother    Cancer Maternal Grandmother    Heart disease Father    Lung cancer Mother    Other Brother        gunshot   Colon cancer Other     Social History   Socioeconomic History   Marital status: Single    Spouse name: Not on file   Number of children: 2   Years of education: Not on file   Highest education level: Not on file  Occupational History   Occupation: Disabled  Tobacco Use   Smoking status: Former    Current packs/day: 1.00    Average packs/day: 1 pack/day for 20.0 years (20.0 ttl pk-yrs)    Types:  Cigarettes   Smokeless tobacco: Never   Tobacco comments:    trying to quit  Vaping Use   Vaping status: Former  Substance and Sexual Activity   Alcohol use: No    Alcohol/week: 0.0 standard drinks of alcohol   Drug use: No   Sexual activity: Not Currently    Birth control/protection: Surgical    Comment: hyst  Other Topics Concern   Not on file  Social History Narrative   ** Merged History Encounter **       Social Drivers of Health   Financial Resource Strain: Low Risk  (10/06/2022)   Overall Financial Resource Strain (CARDIA)    Difficulty of Paying Living Expenses: Not very hard  Food Insecurity: No Food Insecurity (02/21/2023)   Hunger Vital Sign    Worried About Running Out of Food in the Last Year: Never true    Ran Out of Food in the Last Year: Never true  Transportation Needs: No Transportation Needs (02/21/2023)   PRAPARE - Administrator, Civil Service (Medical): No    Lack of Transportation (Non-Medical): No  Physical Activity: Inactive (10/06/2022)   Exercise Vital Sign    Days of Exercise per Week: 0 days    Minutes of Exercise per Session: 0 min  Stress: No Stress Concern Present (10/06/2022)   [employer] of Occupational Health - Occupational Stress Questionnaire    Feeling of Stress : Not at all  Social Connections: Socially Isolated (10/06/2022)   Social Connection and Isolation Panel [NHANES]    Frequency of Communication with Friends and Family: Once a week    Frequency of Social Gatherings with Friends and Family: Once a week    Attends Religious Services: Never    Database administrator or Organizations: No    Attends Banker Meetings: Never    Marital Status: Divorced  Catering manager Violence: Not At Risk (02/21/2023)   Humiliation, Afraid, Rape, and Kick questionnaire    Fear of Current or Ex-Partner: No    Emotionally Abused: No    Physically Abused: No    Sexually Abused: No    Review of Systems: See HPI,  otherwise negative ROS  Physical Exam: Vital signs in last 24 hours:     General:   Alert,  Well-developed, well-nourished, pleasant and cooperative in NAD Head:  Normocephalic and atraumatic. Eyes:  Sclera clear, no icterus.  Conjunctiva pink. Ears:  Normal auditory acuity. Nose:  No deformity, discharge,  or lesions. Msk:  Symmetrical without gross deformities. Normal posture. Extremities:  Without clubbing or edema. Neurologic:  Alert and  oriented x4;  grossly normal neurologically. Skin:  Intact without significant lesions or rashes. Psych:  Alert and cooperative. Normal mood and affect.  Impression/Plan: [name] is here for a colonoscopy to be performed for colon cancer screening purposes.  The risks of the procedure including infection, bleed, or perforation as well as benefits, limitations, alternatives and imponderables have been reviewed with the patient. Questions have been answered. All parties agreeable.

## 2023-06-27 NOTE — Transfer of Care (Signed)
Immediate Anesthesia Transfer of Care Note  Patient: CAMYLA ROBARDS  Procedure(s) Performed: COLONOSCOPY WITH PROPOFOL POLYPECTOMY  Patient Location: Short Stay  Anesthesia Type:General  Level of Consciousness: awake  Airway & Oxygen Therapy: Patient Spontanous Breathing  Post-op Assessment: Report given to RN and Post -op Vital signs reviewed and stable  Post vital signs: Reviewed and stable  Last Vitals:  Vitals Value Taken Time  BP 103/45 06/27/23 1327  Temp 36.9 C 06/27/23 1327  Pulse 77 06/27/23 1327  Resp 19 06/27/23 1327  SpO2 96 % 06/27/23 1327    Last Pain:  Vitals:   06/27/23 1327  TempSrc: Oral  PainSc: 0-No pain      Patients Stated Pain Goal: 7 (06/27/23 1239)  Complications: No notable events documented.

## 2023-06-27 NOTE — Discharge Instructions (Signed)
  Discharge instructions Please read the instructions outlined below and refer to this sheet in the next few weeks. These discharge instructions provide you with general information on caring for yourself after you leave the hospital. Your doctor may also give you specific instructions. While your treatment has been planned according to the most current medical practices available, unavoidable complications occasionally occur. If you have any problems or questions after discharge, please call your doctor. ACTIVITY You may resume your regular activity but move at a slower pace for the next 24 hours.  Take frequent rest periods for the next 24 hours.  Walking will help expel (get rid of) the air and reduce the bloated feeling in your abdomen.  No driving for 24 hours (because of the anesthesia (medicine) used during the test).  You may shower.  Do not sign any important legal documents or operate any machinery for 24 hours (because of the anesthesia used during the test).  NUTRITION Drink plenty of fluids.  You may resume your normal diet.  Begin with a light meal and progress to your normal diet.  Avoid alcoholic beverages for 24 hours or as instructed by your caregiver.  MEDICATIONS You may resume your normal medications unless your caregiver tells you otherwise.  WHAT YOU CAN EXPECT TODAY You may experience abdominal discomfort such as a feeling of fullness or "gas" pains.  FOLLOW-UP Your doctor will discuss the results of your test with you.  SEEK IMMEDIATE MEDICAL ATTENTION IF ANY OF THE FOLLOWING OCCUR: Excessive nausea (feeling sick to your stomach) and/or vomiting.  Severe abdominal pain and distention (swelling).  Trouble swallowing.  Temperature over 101 F (37.8 C).  Rectal bleeding or vomiting of blood.    Stop using high dose aspirin including Goody/BC powders, NSAIDs such as Aleve, ibuprofen, naproxen, Motrin, Voltaren or Advil (even the topical ones) for atleast 14 days      I hope you have a great rest of your week!   Vista Lawman , M.D.. Gastroenterology and Hepatology [facility]

## 2023-06-27 NOTE — Op Note (Signed)
[facility] Patient Name: [name] Procedure Date: 06/27/2023 12:02 PM MRN: [id number] Date of Birth: 02-03-1961 Attending MD: Sanjuan Dame , MD, [id number] CSN: [id number] Age: 62 Admit Type: Outpatient Procedure:                Colonoscopy Indications:              Screening for colorectal malignant neoplasm Providers:                Sanjuan Dame, MD, Sheran Fava, Elinor Parkinson Referring MD:              Medicines:                Monitored Anesthesia Care Complications:            No immediate complications. Estimated Blood Loss:     Estimated blood loss was minimal. Procedure:                Pre-Anesthesia Assessment:                           - Prior to the procedure, a History and Physical                            was performed, and patient medications and                            allergies were reviewed. The patient's tolerance of                            previous anesthesia was also reviewed. The risks                            and benefits of the procedure and the sedation                            options and risks were discussed with the patient.                            All questions were answered, and informed consent                            was obtained. Prior Anticoagulants: The patient has                            taken no anticoagulant or antiplatelet agents                            except for NSAID medication. ASA Grade Assessment:                            III - A patient with severe systemic disease. After  reviewing the risks and benefits, the patient was                            deemed in satisfactory condition to undergo the                            procedure.                           After obtaining informed consent, the colonoscope                            was passed under direct vision. Throughout the                            procedure, the patient's  blood pressure, pulse, and                            oxygen saturations were monitored continuously. The                            [id number]-[id number] ([id number]) scope was introduced through                            the anus and advanced to the the cecum, identified                            by appendiceal orifice and ileocecal valve. The                            colonoscopy was performed without difficulty. The                            patient tolerated the procedure well. The quality                            of the bowel preparation was evaluated using the                            BBPS [facility] Bowel Preparation Scale) with scores                            of: Right Colon = 2 (minor amount of residual                            staining, small fragments of stool and/or opaque                            liquid, but mucosa seen well), Transverse Colon = 3                            (entire mucosa seen well with no residual staining,  small fragments of stool or opaque liquid) and Left                            Colon = 3 (entire mucosa seen well with no residual                            staining, small fragments of stool or opaque                            liquid). The total BBPS score equals 8. The                            ileocecal valve, appendiceal orifice, and rectum                            were photographed. Scope In: 12:56:44 PM Scope Out: 1:25:20 PM Scope Withdrawal Time: 0 hours 26 minutes 36 seconds  Total Procedure Duration: 0 hours 28 minutes 36 seconds  Findings:      The perianal and digital rectal examinations were normal.      A 6 mm polyp was found in the ascending colon. The polyp was sessile.       The polyp was removed with a cold snare. Resection and retrieval were       complete.      A 10 mm polyp was found in the sigmoid colon. The polyp was sessile. The       polyp was removed with a cold snare. Resection and retrieval were        complete.      Non-bleeding external and internal hemorrhoids were found during       retroflexion. The hemorrhoids were small. Impression:               - One 6 mm polyp in the ascending colon, removed                            with a cold snare. Resected and retrieved.                           - One 10 mm polyp in the sigmoid colon, removed                            with a cold snare. Resected and retrieved.                           - Non-bleeding external and internal hemorrhoids. Moderate Sedation:      Per Anesthesia Care Recommendation:           - Patient has a contact number available for                            emergencies. The signs and symptoms of potential                            delayed complications were discussed with the  patient. Return to normal activities tomorrow.                            Written discharge instructions were provided to the                            patient.                           - Resume previous diet.                           - Continue present medications.                           - Await pathology results.                           - Repeat colonoscopy in 3 years for surveillance.                           - Return to primary care physician as previously                            scheduled. Procedure Code(s):        --- Professional ---                           [phone], Colonoscopy, flexible; with removal of                            tumor(s), polyp(s), or other lesion(s) by snare                            technique Diagnosis Code(s):        --- Professional ---                           Z12.11, Encounter for screening for malignant                            neoplasm of colon                           D12.2, Benign neoplasm of ascending colon                           D12.5, Benign neoplasm of sigmoid colon                           K64.8, Other hemorrhoids CPT copyright 2022 American Medical  Association. All rights reserved. The codes documented in this report are preliminary and upon coder review may  be revised to meet current compliance requirements. Sanjuan Dame, MD Sanjuan Dame, MD 06/27/2023 1:30:01 PM This report has been signed electronically. Number of Addenda: 0

## 2023-06-28 ENCOUNTER — Encounter (INDEPENDENT_AMBULATORY_CARE_PROVIDER_SITE_OTHER): Payer: Self-pay | Admitting: *Deleted

## 2023-06-30 LAB — SURGICAL PATHOLOGY

## 2023-06-30 NOTE — Anesthesia Postprocedure Evaluation (Signed)
Anesthesia Post Note  Patient: [name]  Procedure(s) Performed: COLONOSCOPY WITH PROPOFOL POLYPECTOMY  Patient location during evaluation: Phase II Anesthesia Type: General Level of consciousness: awake Pain management: pain level controlled Vital Signs Assessment: post-procedure vital signs reviewed and stable Respiratory status: spontaneous breathing and respiratory function stable Cardiovascular status: blood pressure returned to baseline and stable Postop Assessment: no headache and no apparent nausea or vomiting Anesthetic complications: no Comments: Late entry   No notable events documented.   Last Vitals:  Vitals:   06/27/23 1239 06/27/23 1327  BP: (!) 114/46 (!) 103/45  Pulse: 71 77  Resp: 20 19  Temp: 36.8 C 36.9 C  SpO2: 96% 96%    Last Pain:  Vitals:   06/28/23 1501  TempSrc:   PainSc: 0-No pain                 Windell Norfolk

## 2023-07-03 ENCOUNTER — Encounter (HOSPITAL_COMMUNITY): Payer: Self-pay | Admitting: Gastroenterology

## 2023-07-06 ENCOUNTER — Encounter (INDEPENDENT_AMBULATORY_CARE_PROVIDER_SITE_OTHER): Payer: Self-pay | Admitting: *Deleted

## 2023-07-06 NOTE — Progress Notes (Signed)
I reviewed the pathology results. Ann, can you send her a letter with the findings as described below please? Repeat colonoscopy in 3 years  Thanks,  Vista Lawman, MD Gastroenterology and Hepatology [facility]  ---------------------------------------------------------------------------------------------  [facility] [address], [place], [place] [address] Phone:  [phone]   07/06/23 [name], [place]   Dear [name],  I am writing to inform you that the biopsies taken during your recent endoscopic examination showed: I am writing to let you know the results of your recent colonoscopy.  You had a total of 2 polyps removed. 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  Given these findings, it is recommended that your next colonoscopy be performed in 3 years as one of the polyp is considered to be large size.   Also I value your feedback , so if you get a survey , please take the time to fill it out and thank you for choosing [facility]  Please call us at [phone] if you have persistent problems or have questions about your condition that have not been fully answered at this time.  Sincerely,  Vista Lawman, MD Gastroenterology and Hepatology

## 2023-07-17 ENCOUNTER — Emergency Department (HOSPITAL_COMMUNITY): Payer: 59

## 2023-07-17 ENCOUNTER — Emergency Department (HOSPITAL_COMMUNITY)
Admission: EM | Admit: 2023-07-17 | Discharge: 2023-07-17 | Disposition: A | Discharge status: Home / Self Care | Payer: 59 | Attending: Emergency Medicine | Admitting: Emergency Medicine

## 2023-07-17 ENCOUNTER — Other Ambulatory Visit: Payer: Self-pay

## 2023-07-17 ENCOUNTER — Encounter (HOSPITAL_COMMUNITY): Payer: Self-pay | Admitting: Emergency Medicine

## 2023-07-17 DIAGNOSIS — Z7982 Long term (current) use of aspirin: Secondary | ICD-10-CM | POA: Insufficient documentation

## 2023-07-17 DIAGNOSIS — R0602 Shortness of breath: Secondary | ICD-10-CM | POA: Diagnosis present

## 2023-07-17 DIAGNOSIS — D72829 Elevated white blood cell count, unspecified: Secondary | ICD-10-CM | POA: Diagnosis not present

## 2023-07-17 DIAGNOSIS — E119 Type 2 diabetes mellitus without complications: Secondary | ICD-10-CM | POA: Insufficient documentation

## 2023-07-17 DIAGNOSIS — R791 Abnormal coagulation profile: Secondary | ICD-10-CM | POA: Insufficient documentation

## 2023-07-17 DIAGNOSIS — Z8541 Personal history of malignant neoplasm of cervix uteri: Secondary | ICD-10-CM | POA: Diagnosis not present

## 2023-07-17 DIAGNOSIS — J40 Bronchitis, not specified as acute or chronic: Secondary | ICD-10-CM | POA: Insufficient documentation

## 2023-07-17 DIAGNOSIS — Z20822 Contact with and (suspected) exposure to covid-19: Secondary | ICD-10-CM | POA: Diagnosis not present

## 2023-07-17 DIAGNOSIS — R0789 Other chest pain: Secondary | ICD-10-CM

## 2023-07-17 DIAGNOSIS — I2510 Atherosclerotic heart disease of native coronary artery without angina pectoris: Secondary | ICD-10-CM | POA: Insufficient documentation

## 2023-07-17 DIAGNOSIS — J45909 Unspecified asthma, uncomplicated: Secondary | ICD-10-CM | POA: Diagnosis not present

## 2023-07-17 LAB — BASIC METABOLIC PANEL
Anion gap: 8 (ref 5–15)
BUN: 10 mg/dL (ref 8–23)
CO2: 29 mmol/L (ref 22–32)
Calcium: 8.9 mg/dL (ref 8.9–10.3)
Chloride: 99 mmol/L (ref 98–111)
Creatinine, Ser: 0.72 mg/dL (ref 0.44–1.00)
GFR, Estimated: >60 mL/min (ref >60)
Glucose, Bld: 88 mg/dL (ref 70–99)
Potassium: 3.9 mmol/L (ref 3.5–5.1)
Sodium: 136 mmol/L (ref 135–145)

## 2023-07-17 LAB — CBC
HCT: 35.3 % — ABNORMAL LOW (ref 36.0–46.0)
Hemoglobin: 10.3 g/dL — ABNORMAL LOW (ref 12.0–15.0)
MCH: 21.4 pg — ABNORMAL LOW (ref 26.0–34.0)
MCHC: 29.2 g/dL — ABNORMAL LOW (ref 30.0–36.0)
MCV: 73.2 fL — ABNORMAL LOW (ref 80.0–100.0)
Platelets: 290 10*3/uL (ref 150–400)
RBC: 4.82 MIL/uL (ref 3.87–5.11)
RDW: 19.7 % — ABNORMAL HIGH (ref 11.5–15.5)
WBC: 11 10*3/uL — ABNORMAL HIGH (ref 4.0–10.5)
nRBC: 0 % (ref 0.0–0.2)

## 2023-07-17 LAB — RESP PANEL BY RT-PCR (RSV, FLU A&B, COVID)  RVPGX2
Influenza A by PCR: NEGATIVE
Influenza B by PCR: NEGATIVE
Resp Syncytial Virus by PCR: NEGATIVE
SARS Coronavirus 2 by RT PCR: NEGATIVE

## 2023-07-17 LAB — D-DIMER, QUANTITATIVE: D-Dimer, Quant: 0.95 ug{FEU}/mL — ABNORMAL HIGH (ref 0.00–0.50)

## 2023-07-17 LAB — TROPONIN I (HIGH SENSITIVITY)
Troponin I (High Sensitivity): <2 ng/L (ref <18)
Troponin I (High Sensitivity): <2 ng/L (ref <18)

## 2023-07-17 MED ORDER — PREDNISONE 50 MG PO TABS
50.0000 mg | ORAL_TABLET | Freq: Every day | ORAL | 0 refills | Status: AC
Start: 2023-07-17 — End: 2023-07-22

## 2023-07-17 MED ORDER — METHYLPREDNISOLONE SODIUM SUCC 125 MG IJ SOLR
125.0000 mg | Freq: Once | INTRAMUSCULAR | Status: AC
  Administered 2023-07-17: 125 mg via INTRAVENOUS
  Filled 2023-07-17: qty 2

## 2023-07-17 MED ORDER — AMOXICILLIN-POT CLAVULANATE 875-125 MG PO TABS: 1.0000 | ORAL_TABLET | Freq: Two times a day (BID) | ORAL | 0 refills | Status: DC

## 2023-07-17 MED ORDER — IPRATROPIUM-ALBUTEROL 0.5-2.5 (3) MG/3ML IN SOLN
3.0000 mL | Freq: Once | RESPIRATORY_TRACT | Status: AC
Start: 2023-07-17 — End: 2023-07-17
  Administered 2023-07-17: 3 mL via RESPIRATORY_TRACT
  Filled 2023-07-17: qty 3

## 2023-07-17 MED ORDER — IOHEXOL 350 MG/ML SOLN
75.0000 mL | Freq: Once | INTRAVENOUS | Status: AC | PRN
  Administered 2023-07-17: 75 mL via INTRAVENOUS

## 2023-07-17 NOTE — ED Notes (Signed)
 Pt ambulated to the bathroom.

## 2023-07-17 NOTE — Discharge Instructions (Addendum)
 We evaluated you for your chest pain.  Your symptoms are most likely due to bronchitis.  We have prescribed you antibiotics and steroids.  Please use your inhalers at home as prescribed.  Your cardiac enzyme testing was negative, but your CT scan did show a lot of coronary artery disease.  Please follow-up with your cardiologist to further discuss this.  I do not think this caused your chest pain, but to be safe I would recommend further testing.  Please have a repeat chest CT performed in 6 months to reevaluate some borderline enlarged lymph nodes in your chest.  Please return to the emergency department if you have any new or worsening symptoms, chest pain, difficulty breathing, fevers, or any other new symptoms.

## 2023-07-17 NOTE — ED Provider Notes (Signed)
 [place] EMERGENCY DEPARTMENT AT [facility] Provider Note  CSN: [id number] Arrival date & time: 07/17/23 1504  Chief Complaint(s) Chest Pain  HPI [name] is a 63 y.o. female with history of COPD, CHF, hypertension, coronary artery disease, diabetes presenting to the emergency department with shortness of breath.  Patient reports shortness of breath, chest pain for past few days, chest pain started yesterday.  She also reports cough, nonproductive.  She reports some runny nose, sore throat.  She initially went to urgent care, they are with her chest pain, they were concerned given her history and referred her to the emergency department.  No fevers or chills.   Past Medical History Past Medical History:  Diagnosis Date   Anxiety    Arthritis    Asthma    Cancer (HCC)    cervical cancer; pt states a long long time ago   CHF (congestive heart failure) (HCC)    Coronary artery disease    Depression    Diabetes mellitus without complication (HCC)    Dyspnea    Edema of all four extremities    Essential (primary) hypertension 06/24/2020   Hyperlipemia    Type II diabetes mellitus (HCC) 03/22/2022   Patient Active Problem List   Diagnosis Date Noted   Adenomatous polyp of sigmoid colon 06/27/2023   Chronic congestive heart failure (HCC) 06/05/2023   Superficial fungus infection of skin 05/19/2023   Left breast abscess 05/19/2023   OAB (overactive bladder) 03/23/2023   S/P total knee replacement, right 02/21/23 03/08/2023   Primary osteoarthritis of right knee 02/21/2023   Lesion of vulva 10/06/2022   Lichen sclerosus et atrophicus 10/06/2022   S/P hysterectomy with oophorectomy 10/06/2022   Hot flashes 10/06/2022   Current use of estrogen therapy 10/06/2022   Anxiety 03/22/2022   Asthma 03/22/2022   Bipolar 1 disorder (HCC) 03/22/2022   Chronic pain 03/22/2022   Edema 03/22/2022   Gastro-esophageal reflux 03/22/2022   Hyperlipidemia 03/22/2022    Insomnia 03/22/2022   Menopausal symptom 03/22/2022   Sinusitis 03/22/2022   Type II diabetes mellitus (HCC) 03/22/2022   Osteoarthritis of left knee 03/15/2022   History of operative procedure on lumbosacral spinal structure 02/15/2022   Urge incontinence 09/02/2021   Cervical radiculopathy 12/30/2020   Pseudoarthrosis of lumbar spine 08/25/2020   Essential (primary) hypertension 06/24/2020   Body mass index (BMI) 45.0-49.9, adult (HCC) 05/06/2020   Carpal tunnel syndrome 04/01/2020   Spondylolisthesis, lumbar region 11/22/2019   Localized swelling of both lower legs 11/12/2019   Lumbar stenosis 11/05/2019   S/P left knee arthroscopy 06/27/2019 07/01/2019   Derangement of posterior horn of medial meniscus of left knee    Primary osteoarthritis of left knee    S/P right knee arthroscopy 05/09/19 05/29/2019   Depression 07/24/2014   LOWER LEG, ARTHRITIS, DEGEN./OSTEO 02/18/2009   DERANGEMENT MENISCUS 02/18/2009   ANSERINE BURSITIS, RIGHT 02/18/2009   Home Medication(s) Prior to Admission medications   Medication Sig Start Date End Date Taking? Authorizing Provider  acetaminophen  (TYLENOL ) 500 MG tablet Take 500 mg by mouth every 6 (six) hours as needed for moderate pain (pain score 4-6).   Yes [provider]  albuterol  (VENTOLIN  HFA) 108 (90 Base) MCG/ACT inhaler Inhale 1-2 puffs into the lungs every 6 (six) hours as needed for wheezing or shortness of breath.   Yes [provider]  amoxicillin -clavulanate (AUGMENTIN ) 875-125 MG tablet Take 1 tablet by mouth every 12 (twelve) hours. 07/17/23  Yes Francesca Elsie CROME, MD  ARIPiprazole  (ABILIFY ) 2 MG tablet Take 2 mg by mouth daily. 10/10/22  Yes [provider]  aspirin  81 MG chewable tablet Chew 1 tablet (81 mg total) by mouth 2 (two) times daily. 02/22/23  Yes Margrette Taft BRAVO, MD  atorvastatin  (LIPITOR) 80 MG tablet Take 1 tablet (80 mg total) by mouth daily. Patient taking differently: Take 40 mg by mouth  at bedtime. 10/17/22 10/12/23 Yes O'Neal, Darryle Ned, MD  celecoxib  (CELEBREX ) 100 MG capsule Take 100 mg by mouth 2 (two) times daily.   Yes [provider]  Empagliflozin -metFORMIN  HCl ER (SYNJARDY  XR) 12.11-998 MG TB24 Take 1 tablet by mouth daily.   Yes [provider]  estradiol  (ESTRACE ) 2 MG tablet TAKE ONE TABLET AT BEDTIME 01/04/23  Yes Jayne Vonn DEL, MD  FLUoxetine  (PROZAC ) 40 MG capsule Take 40 mg by mouth daily. 12/06/22  Yes [provider]  furosemide  (LASIX ) 40 MG tablet Take 40 mg by mouth 2 (two) times daily. 04/10/23  Yes [provider]  gabapentin  (NEURONTIN ) 300 MG capsule Take 1 capsule (300 mg total) by mouth 3 (three) times daily. 08/16/19  Yes Margrette Taft BRAVO, MD  loratadine  (CLARITIN ) 10 MG tablet Take 10 mg by mouth daily.   Yes [provider]  nitroGLYCERIN  (NITROSTAT ) 0.4 MG SL tablet Place 1 tablet (0.4 mg total) under the tongue every 5 (five) minutes as needed for chest pain. 10/17/22 07/17/23 Yes O'Neal, Darryle Ned, MD  oxyCODONE -acetaminophen  (PERCOCET) 7.5-325 MG tablet Take 1 tablet by mouth 4 (four) times daily. 05/19/23  Yes [provider]  potassium chloride  SA (KLOR-CON  M20) 20 MEQ tablet Take 1 tablet (20 mEq total) by mouth daily. 11/18/22 07/17/23 Yes O'Neal, Darryle Ned, MD  predniSONE  (DELTASONE ) 50 MG tablet Take 1 tablet (50 mg total) by mouth daily with breakfast for 5 days. 07/17/23 07/22/23 Yes Francesca Elsie CROME, MD  pseudoephedrine-guaifenesin  (MUCINEX  D) 60-600 MG 12 hr tablet Take 1 tablet by mouth every 12 (twelve) hours.   Yes [provider]  traMADol  (ULTRAM ) 50 MG tablet Take 1 tablet (50 mg total) by mouth every 6 (six) hours as needed. 03/23/23 03/22/24 Yes McKenzie, Belvie CROME, MD  traZODone  (DESYREL ) 100 MG tablet Take 200 mg by mouth at bedtime.   Yes [provider]                                                                                                                                     Past Surgical History Past Surgical History:  Procedure Laterality Date   ABDOMINAL HYSTERECTOMY     ANTERIOR LAT LUMBAR FUSION N/A 11/05/2019   Procedure: Lumbar Four-Five Anterolateral lumbar interbody fusion with lateral plate;  Surgeon: Colon Shove, MD;  Location: [facility] OR;  Service: Neurosurgery;  Laterality: N/A;  anterolateral   BACK SURGERY     pins   BACK SURGERY  07/11/2008   BIOPSY  06/29/2021   Procedure:  BIOPSY;  Surgeon: Eartha Flavors, Toribio, MD;  Location: [facility];  Service: Gastroenterology;;   BLADDER REPAIR     CARPAL TUNNEL RELEASE Right    CESAREAN SECTION  1982, 1984   COLONOSCOPY N/A 06/12/2013   rehman: normal   COLONOSCOPY WITH PROPOFOL  N/A 06/27/2023   Procedure: COLONOSCOPY WITH PROPOFOL ;  Surgeon: Cinderella Deatrice FALCON, MD;  Location: [facility];  Service: Endoscopy;  Laterality: N/A;  2:00PM;ASA 3   CORONARY PRESSURE/FFR STUDY N/A 10/20/2022   Procedure: CORONARY PRESSURE/FFR STUDY;  Surgeon: Wendel Lurena POUR, MD;  Location: [facility];  Service: Cardiovascular;  Laterality: N/A;   CYSTOSCOPY WITH INJECTION N/A 03/23/2023   Procedure: CYSTOSCOPY WITH INJECTION-botox ;  Surgeon: Sherrilee Belvie CROME, MD;  Location: [facility] ORS;  Service: Urology;  Laterality: N/A;   ESOPHAGOGASTRODUODENOSCOPY (EGD) WITH PROPOFOL  N/A 06/29/2021   Procedure: ESOPHAGOGASTRODUODENOSCOPY (EGD) WITH PROPOFOL ;  Surgeon: Eartha Flavors Toribio, MD;  Location: [facility];  Service: Gastroenterology;  Laterality: N/A;  8:40   KNEE ARTHROSCOPY WITH MEDIAL MENISECTOMY Right 05/09/2019   Procedure: KNEE ARTHROSCOPY WITH MEDIAL MENISECTOMY AND LATERAL MENISECTOMY;  Surgeon: Margrette Taft BRAVO, MD;  Location: [facility] OR[facility];  Service: Orthopedics;  Laterality: Right;   KNEE ARTHROSCOPY WITH MEDIAL MENISECTOMY Left 06/27/2019   Procedure: KNEE ARTHROSCOPY WITH MEDIAL MENISCECTOMY;  Surgeon: Margrette Taft BRAVO, MD;  Location: [facility];  Service: Orthopedics;   Laterality: Left;   LEFT HEART CATH AND CORONARY ANGIOGRAPHY N/A 10/20/2022   Procedure: LEFT HEART CATH AND CORONARY ANGIOGRAPHY;  Surgeon: Wendel Lurena POUR, MD;  Location: [facility];  Service: Cardiovascular;  Laterality: N/A;   POLYPECTOMY  06/27/2023   Procedure: POLYPECTOMY;  Surgeon: Cinderella Deatrice FALCON, MD;  Location: [facility];  Service: Endoscopy;;   TOTAL KNEE ARTHROPLASTY Left 03/15/2022   Procedure: TOTAL KNEE ARTHROPLASTY;  Surgeon: Margrette Taft BRAVO, MD;  Location: [facility];  Service: Orthopedics;  Laterality: Left;   TOTAL KNEE ARTHROPLASTY Right 02/21/2023   Procedure: TOTAL KNEE ARTHROPLASTY;  Surgeon: Margrette Taft BRAVO, MD;  Location: [facility];  Service: Orthopedics;  Laterality: Right;   Family History Family History  Problem Relation Age of Onset   Cancer Paternal Grandfather    Cancer Paternal Grandmother    Cancer Maternal Grandmother    Heart disease Father    Lung cancer Mother    Other Brother        gunshot   Colon cancer Other     Social History Social History   Tobacco Use   Smoking status: Former    Current packs/day: 1.00    Average packs/day: 1 pack/day for 20.0 years (20.0 ttl pk-yrs)    Types: Cigarettes   Smokeless tobacco: Never   Tobacco comments:    trying to quit  Vaping Use   Vaping status: Former  Substance Use Topics   Alcohol use: No    Alcohol/week: 0.0 standard drinks of alcohol   Drug use: No   Allergies Patient has no known allergies.  Review of Systems Review of Systems  All other systems reviewed and are negative.   Physical Exam Vital Signs  I have reviewed the triage vital signs BP (!) 108/55   Pulse 76   Temp 97.8 F (36.6 C) (Oral)   Resp 13   SpO2 93%  Physical Exam Vitals and nursing note reviewed.  Constitutional:      General: She is not in acute distress.    Appearance: She is well-developed.  HENT:  Head: Normocephalic and atraumatic.     Mouth/Throat:     Mouth: Mucous membranes are  moist.  Eyes:     Pupils: Pupils are equal, round, and reactive to light.  Cardiovascular:     Rate and Rhythm: Normal rate and regular rhythm.     Heart sounds: No murmur heard. Pulmonary:     Effort: Pulmonary effort is normal. No respiratory distress.     Breath sounds: Wheezing present.  Abdominal:     General: Abdomen is flat.     Palpations: Abdomen is soft.     Tenderness: There is no abdominal tenderness.  Musculoskeletal:        General: No tenderness.     Right lower leg: No edema.     Left lower leg: No edema.  Skin:    General: Skin is warm and dry.  Neurological:     General: No focal deficit present.     Mental Status: She is alert. Mental status is at baseline.  Psychiatric:        Mood and Affect: Mood normal.        Behavior: Behavior normal.     ED Results and Treatments Labs (all labs ordered are listed, but only abnormal results are displayed) Labs Reviewed  CBC - Abnormal; Notable for the following components:      Result Value   WBC 11.0 (*)    Hemoglobin 10.3 (*)    HCT 35.3 (*)    MCV 73.2 (*)    MCH 21.4 (*)    MCHC 29.2 (*)    RDW 19.7 (*)    All other components within normal limits  D-DIMER, QUANTITATIVE - Abnormal; Notable for the following components:   D-Dimer, Quant 0.95 (*)    All other components within normal limits  RESP PANEL BY RT-PCR (RSV, FLU A&B, COVID)  RVPGX2  BASIC METABOLIC PANEL  TROPONIN I (HIGH SENSITIVITY)  TROPONIN I (HIGH SENSITIVITY)                                                                                                                          Radiology CT Angio Chest PE W and/or Wo Contrast Result Date: 07/17/2023 CLINICAL DATA:  Chest pain.  Evaluate for pulmonary embolism. EXAM: CT ANGIOGRAPHY CHEST WITH CONTRAST TECHNIQUE: Multidetector CT imaging of the chest was performed using the standard protocol during bolus administration of intravenous contrast. Multiplanar CT image reconstructions and MIPs  were obtained to evaluate the vascular anatomy. RADIATION DOSE REDUCTION: This exam was performed according to the departmental dose-optimization program which includes automated exposure control, adjustment of the mA and/or kV according to patient size and/or use of iterative reconstruction technique. CONTRAST:  75mL OMNIPAQUE  IOHEXOL  350 MG/ML SOLN COMPARISON:  Plain film of earlier today. Cardiac CT 10/12/2022. CTA chest 09/14/2011 FINDINGS: Cardiovascular: The quality of this exam for evaluation of pulmonary embolism is moderate. The bolus is centered in the SVC. No evidence of pulmonary embolism. Aortic atherosclerosis. Normal  heart size, without pericardial effusion. Progressive, left main and three-vessel coronary artery calcification. Mediastinum/Nodes: Prominent but not pathologically sized middle and anterior mediastinal nodes are increased since the prior. Right infrahilar node measures 1.2 cm on 55/5. 1.4 cm right hilar node included on 45/5. Lungs/Pleura: No pleural fluid.  Mild centrilobular emphysema. 5 mm right lower lobe pulmonary nodule on 98/7 with subtly apparent on 09/14/2011 exam and can be presumed benign. Left lower lobe 2 mm calcified granuloma. Left lower lobe pulmonary nodules of up to 4 mm were present on the prior and can be presumed benign. Bibasilar scarring. Upper Abdomen: Irregular hepatic capsule. Moderate caudate lobe enlargement. Normal imaged portions of the spleen, stomach, pancreas, adrenal glands, left kidney. Splenic artery aneurysm of 7 mm on 88/5. Musculoskeletal: No acute osseous abnormality. Review of the MIP images confirms the above findings. IMPRESSION: 1. Moderate quality evaluation for pulmonary embolism. No embolism identified. 2. Age advanced coronary artery atherosclerosis. Recommend assessment of coronary risk factors. No other explanation for chest pain. 3. Borderline mediastinal and mild right hilar/infrahilar adenopathy. Most likely reactive. This could be  re-evaluated with chest CT with contrast in 6 months if desired. 4. Aortic atherosclerosis (ICD10-I70.0) and emphysema (ICD10-J43.9). 5. 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 Electronically Signed   By: Rockey Kilts M.D.   On: 07/17/2023 18:57   DG Chest 2 View Result Date: 07/17/2023 CLINICAL DATA:  Cough and chest pain EXAM: CHEST - 2 VIEW COMPARISON:  04/19/2021 FINDINGS: Airway thickening is present, suggesting bronchitis or reactive airways disease. Cardiac and mediastinal margins appear normal. Atherosclerotic calcification of the aortic arch. No blunting of the costophrenic angles. No significant bony abnormality observed. IMPRESSION: 1. Airway thickening is present, suggesting bronchitis or reactive airways disease. 2. Aortic Atherosclerosis (ICD10-I70.0). Electronically Signed   By: Ryan Salvage M.D.   On: 07/17/2023 16:41    Pertinent labs & imaging results that were available during my care of the patient were reviewed by me and considered in my medical decision making (see MDM for details).  Medications Ordered in ED Medications  methylPREDNISolone  sodium succinate (SOLU-MEDROL ) 125 mg/2 mL injection 125 mg (125 mg Intravenous Given 07/17/23 1605)  ipratropium-albuterol  (DUONEB) 0.5-2.5 (3) MG/3ML nebulizer solution 3 mL (3 mLs Nebulization Given 07/17/23 1645)  iohexol  (OMNIPAQUE ) 350 MG/ML injection 75 mL (75 mLs Intravenous Contrast Given 07/17/23 1820)                                                                                                                                     Procedures Procedures  (including critical care time)  Medical Decision Making / ED Course   MDM:  63 year old female presenting to the emergency department with cough, shortness of breath.,  Chest pain.  Patient well-appearing, physical exam with wheezing.  She does smoke cigarettes and reports she has been diagnosed with COPD.  Symptoms seem probably most consistent with COPD, but  patient is  having some chest pain so we will check troponin, D-dimer.  She did receive nitroglycerin  en route and said that this does help some, but symptoms seem somewhat atypical for acute coronary symptom.  Her EKG is reassuring.  Will obtain chest x-ray.  Will reassess.  Clinical Course as of 07/17/23 2130  Mon Jul 17, 2023  2127 Patient feels much better after breathing treatment, steroids.  Chest x-ray shows bronchitis.  CTA was performed given positive D-dimer which shows no acute PE, coronary artery disease, mild lymphadenopathy.  Discussed findings with the patient.  Her troponin is negative x 2 so very low concern that her symptoms today are due to coronary artery disease.  Recommended close follow-up with her cardiologist as well as primary physician.  Discussed incidental finding of enlarged lymph nodes and need for repeat CT scan. Will discharge patient to home. All questions answered. Patient comfortable with plan of discharge. Return precautions discussed with patient and specified on the after visit summary.  [WS]    Clinical Course User Index [WS] Francesca Elsie CROME, MD     Additional history obtained: -Additional history obtained from ems and spouse -External records from outside source obtained and reviewed including: Chart review including previous notes, labs, imaging, consultation notes including prior notes    Lab Tests: -I ordered, reviewed, and interpreted labs.   The pertinent results include:   Labs Reviewed  CBC - Abnormal; Notable for the following components:      Result Value   WBC 11.0 (*)    Hemoglobin 10.3 (*)    HCT 35.3 (*)    MCV 73.2 (*)    MCH 21.4 (*)    MCHC 29.2 (*)    RDW 19.7 (*)    All other components within normal limits  D-DIMER, QUANTITATIVE - Abnormal; Notable for the following components:   D-Dimer, Quant 0.95 (*)    All other components within normal limits  RESP PANEL BY RT-PCR (RSV, FLU A&B, COVID)  RVPGX2  BASIC METABOLIC  PANEL  TROPONIN I (HIGH SENSITIVITY)  TROPONIN I (HIGH SENSITIVITY)    Notable for normal troponin x2, mild elevated d-dimer  EKG   EKG Interpretation Date/Time:  Monday July 17 2023 15:29:46 EST Ventricular Rate:  73 PR Interval:  210 QRS Duration:  93 QT Interval:  414 QTC Calculation: 457 R Axis:   89  Text Interpretation: Sinus rhythm Borderline right axis deviation Low voltage, precordial leads Confirmed by Francesca Elsie ([id number]) on 07/17/2023 4:27:01 PM         Imaging Studies ordered: I ordered imaging studies including CTA chest On my interpretation imaging demonstrates no PE  I independently visualized and interpreted imaging. I agree with the radiologist interpretation   Medicines ordered and prescription drug management: Meds ordered this encounter  Medications   methylPREDNISolone  sodium succinate (SOLU-MEDROL ) 125 mg/2 mL injection 125 mg   ipratropium-albuterol  (DUONEB) 0.5-2.5 (3) MG/3ML nebulizer solution 3 mL   iohexol  (OMNIPAQUE ) 350 MG/ML injection 75 mL   predniSONE  (DELTASONE ) 50 MG tablet    Sig: Take 1 tablet (50 mg total) by mouth daily with breakfast for 5 days.    Dispense:  5 tablet    Refill:  0   amoxicillin -clavulanate (AUGMENTIN ) 875-125 MG tablet    Sig: Take 1 tablet by mouth every 12 (twelve) hours.    Dispense:  14 tablet    Refill:  0    -I have reviewed the patients home medicines and have made adjustments as needed  Social  Determinants of Health:  Diagnosis or treatment significantly limited by social determinants of health: current smoker   Reevaluation: After the interventions noted above, I reevaluated the patient and found that their symptoms have improved  Co morbidities that complicate the patient evaluation  Past Medical History:  Diagnosis Date   Anxiety    Arthritis    Asthma    Cancer (HCC)    cervical cancer; pt states a long long time ago   CHF (congestive heart failure) (HCC)    Coronary artery  disease    Depression    Diabetes mellitus without complication (HCC)    Dyspnea    Edema of all four extremities    Essential (primary) hypertension 06/24/2020   Hyperlipemia    Type II diabetes mellitus (HCC) 03/22/2022      Dispostion: Disposition decision including need for hospitalization was considered, and patient discharged from emergency department.    Final Clinical Impression(s) / ED Diagnoses Final diagnoses:  Bronchitis  Atypical chest pain     This chart was dictated using voice recognition software.  Despite best efforts to proofread,  errors can occur which can change the documentation meaning.    Francesca Elsie CROME, MD 07/17/23 2130

## 2023-07-17 NOTE — ED Triage Notes (Signed)
 Seen at [facility] today for chest pain. Bib ems for chest pain. Pt a/o. Color wnl. Pt c/o dull chest pain initially yesterday. Went for a walk and became shob and not normal for her. C/o cough more than usual per pt x 1 week. Pt received 2 ntg with complete relief with pain starting at 7. Asa was given at [facility] 324mg .

## 2023-08-07 NOTE — Progress Notes (Unsigned)
Cardiology Office Note:  .   Date:  08/09/2023  ID:  [name], DOB [birth year], MRN [id number] PCP: Rebekah Chesterfield, NP  [facility] Providers Cardiologist:  Reatha Harps, MD { History of Present Illness: Marland Kitchen   [name] is a 63 y.o. female with history of HFpEF, nonobstructive CAD, HLD, tobacco abuse who presents for follow-up.     History of Present Illness   The patient is a 63 year old female with heart failure with preserved ejection fraction and non-destructive coronary artery disease who presents with worsening shortness of breath, chest pain, and edema.  She has experienced worsening shortness of breath, chest pain, and edema, with a weight increase of approximately 10 pounds, which she attributes to fluid retention. She has a history of heart failure with preserved ejection fraction and non-destructive coronary artery disease. Recent hospital evaluation for chest pain revealed negative troponins and a normal EKG.  Her diet includes foods high in salt, such as bacon, sausage, bread, bologna, ham, steak, pork chops, and boxed macaroni and cheese. She does not add salt to her food but consumes processed foods high in sodium.  She is currently taking Lasix 40 mg twice a day, potassium 20 mEq daily, and nitroglycerin as needed for chest pain. She continues to smoke about a pack a day and has attempted to quit in the past without success. She is also on Synjardy for diabetes management and takes aspirin and a cholesterol medication.  She mentions that she cannot wear compression stockings as they cut into her skin.         Problem List CAD -non-obstructive CAD LHC 10/20/2022 -CAC score 800 (99th percentile) -TPV 856 mm3 (calcified 231 mm3; non-calcified plaque 625 mm3) -T chol 127, HDL 44, LDL 61, TG 125 2. HFpEF -LVEDP 32 LHC 10/20/2022 3. DM -A1c 6.8 4. HLD 5. Tobacco abuse     ROS: All other ROS reviewed and negative. Pertinent positives noted  in the HPI.     Studies Reviewed: Marland Kitchen   EKG Interpretation Date/Time:  Wednesday August 09 2023 08:02:12 EST Ventricular Rate:  73 PR Interval:  204 QRS Duration:  84 QT Interval:  400 QTC Calculation: 440 R Axis:   63  Text Interpretation: Normal sinus rhythm Normal ECG Confirmed by Lennie Odor [phone]) on 08/09/2023 8:08:39 AM    TTE 11/03/2022  1. Left ventricular ejection fraction, by estimation, is 60 to 65%. The  left ventricle has normal function. The left ventricle has no regional  wall motion abnormalities. Left ventricular diastolic parameters are  consistent with Grade I diastolic  dysfunction (impaired relaxation).   2. Right ventricular systolic function is normal. The right ventricular  size is normal.   3. The mitral valve is normal in structure. Trivial mitral valve  regurgitation. No evidence of mitral stenosis.   4. The aortic valve is tricuspid. There is mild calcification of the  aortic valve. Aortic valve regurgitation is trivial. No aortic stenosis is  present.   5. The inferior vena cava is normal in size with greater than 50%  respiratory variability, suggesting right atrial pressure of 3 mmHg.  Physical Exam:   VS:  BP (!) 120/58 (BP Location: Left Arm, Patient Position: Sitting, Cuff Size: Large)   Pulse 73   Ht 5\' 1"  (1.549 m)   Wt 237 lb (107.5 kg)   BMI 44.78 kg/m    Wt Readings from Last 3 Encounters:  08/09/23 237 lb (107.5 kg)  06/27/23  225 lb 1.4 oz (102.1 kg)  06/22/23 225 lb 1.4 oz (102.1 kg)    GEN: Well nourished, well developed in no acute distress NECK: No JVD; No carotid bruits CARDIAC: RRR, no murmurs, rubs, gallops RESPIRATORY:  Clear to auscultation without rales, wheezing or rhonchi  ABDOMEN: Soft, non-tender, non-distended EXTREMITIES:  2+ pitting edema  ASSESSMENT AND PLAN: .   Discussed the use of AI scribe software for clinical note transcription with the patient, who gave verbal consent to proceed.  History of Present  Illness   The patient is a 63 year old female with heart failure with preserved ejection fraction and non-destructive coronary artery disease who presents with worsening shortness of breath, chest pain, and edema.  She has experienced worsening shortness of breath, chest pain, and edema, with a weight increase of approximately 10 pounds, which she attributes to fluid retention. She has a history of heart failure with preserved ejection fraction and non-destructive coronary artery disease. Recent hospital evaluation for chest pain revealed negative troponins and a normal EKG.  Her diet includes foods high in salt, such as bacon, sausage, bread, bologna, ham, steak, pork chops, and boxed macaroni and cheese. She does not add salt to her food but consumes processed foods high in sodium.  She is currently taking Lasix 40 mg twice a day, potassium 20 mEq daily, and nitroglycerin as needed for chest pain. She continues to smoke about a pack a day and has attempted to quit in the past without success. She is also on Synjardy for diabetes management and takes aspirin and a cholesterol medication.  She mentions that she cannot wear compression stockings as they cut into her skin.             Follow-up: Return in about 2 weeks (around 08/23/2023).   Signed, Lenna Gilford. Flora Lipps, MD, [facility]  [facility]  [address] [place], [place] [address] [phone]  8:52 AM

## 2023-08-09 ENCOUNTER — Encounter: Payer: Self-pay | Admitting: Cardiovascular Disease

## 2023-08-09 ENCOUNTER — Ambulatory Visit: Payer: 59 | Attending: Cardiovascular Disease | Admitting: Cardiovascular Disease

## 2023-08-09 VITALS — BP 120/58 | HR 73 | Ht 61.0 in | Wt 237.0 lb

## 2023-08-09 DIAGNOSIS — E782 Mixed hyperlipidemia: Secondary | ICD-10-CM | POA: Diagnosis not present

## 2023-08-09 DIAGNOSIS — I5033 Acute on chronic diastolic (congestive) heart failure: Secondary | ICD-10-CM

## 2023-08-09 DIAGNOSIS — Z720 Tobacco use: Secondary | ICD-10-CM | POA: Diagnosis not present

## 2023-08-09 DIAGNOSIS — I2510 Atherosclerotic heart disease of native coronary artery without angina pectoris: Secondary | ICD-10-CM

## 2023-08-09 DIAGNOSIS — I5032 Chronic diastolic (congestive) heart failure: Secondary | ICD-10-CM

## 2023-08-09 MED ORDER — ASPIRIN 81 MG PO CHEW: 81.0000 mg | CHEWABLE_TABLET | Freq: Two times a day (BID) | ORAL | 0 refills | Status: DC

## 2023-08-09 MED ORDER — NITROGLYCERIN 0.4 MG SL SUBL: 0.4000 mg | SUBLINGUAL_TABLET | SUBLINGUAL | 3 refills | Status: AC | PRN

## 2023-08-09 MED ORDER — SPIRONOLACTONE 25 MG PO TABS: 12.5000 mg | ORAL_TABLET | Freq: Every day | ORAL | 3 refills | Status: AC

## 2023-08-09 MED ORDER — ATORVASTATIN CALCIUM 80 MG PO TABS: 40.0000 mg | ORAL_TABLET | Freq: Every day | ORAL | 3 refills | Status: AC

## 2023-08-09 MED ORDER — TORSEMIDE 20 MG PO TABS: 40.0000 mg | ORAL_TABLET | Freq: Every day | ORAL | 3 refills | Status: DC

## 2023-08-09 NOTE — Patient Instructions (Signed)
Medication Instructions:  - STOP LASIX  -START TORSEMIDE 40MG  for 3 days then take 40mg  daily.  - START ALDACTONE 12.5MG  DAILY    *If you need a refill on your cardiac medications before your next appointment, please call your pharmacy*   Lab Work: NONE    If you have labs (blood work) drawn today and your tests are completely normal, you will receive your results only by: MyChart Message (if you have MyChart) OR A paper copy in the mail If you have any lab test that is abnormal or we need to change your treatment, we will call you to review the results.   Testing/Procedures: NONE    Follow-Up: At [facility], you and your health needs are our priority.  As part of our continuing mission to provide you with exceptional heart care, we have created designated Provider Care Teams.  These Care Teams include your primary Cardiologist (physician) and Advanced Practice Providers (APPs -  Physician Assistants and Nurse Practitioners) who all work together to provide you with the care you need, when you need it.  We recommend signing up for the patient portal called "MyChart".  Sign up information is provided on this After Visit Summary.  MyChart is used to connect with patients for Virtual Visits (Telemedicine).  Patients are able to view lab/test results, encounter notes, upcoming appointments, etc.  Non-urgent messages can be sent to your provider as well.   To learn more about what you can do with MyChart, go to [email or web address].    Your next appointment:   2 week(s)  The format for your next appointment:   In Person  Provider:   Edd Fabian, FNP, Micah Flesher, PA-C, Marjie Skiff, PA-C, Robet Leu, PA-C, Juanda Crumble, PA-C, Joni Reining, DNP, ANP, Azalee Course, PA-C, Bernadene Person, NP, or Reather Littler, NP    Then, Reatha Harps, MD will plan to see you again in 6 month(s).   Other Instructions

## 2023-08-18 NOTE — Progress Notes (Deleted)
  Cardiology Office Note:  .   Date:  08/18/2023  ID:  [name], DOB 1961-06-10, MRN [id number] PCP: Rebekah Chesterfield, NP  [facility] Providers Cardiologist:  Reatha Harps, MD { History of Present Illness: Marland Kitchen   [name] is a 63 y.o. female with history of HFpEF, nonobstructive CAD, HLD, tobacco abuse.  Last seen in the office on 08/09/2023 at which time she had evidence of volume overload with shortness of breath chest discomfort and edema with a weight increase of 10 pounds.  ROS: ***  Studies Reviewed: .        *** EKG Interpretation Date/Time:    Ventricular Rate:    PR Interval:    QRS Duration:    QT Interval:    QTC Calculation:   R Axis:      Text Interpretation:      Physical Exam:   VS:  There were no vitals taken for this visit.   Wt Readings from Last 3 Encounters:  08/09/23 237 lb (107.5 kg)  06/27/23 225 lb 1.4 oz (102.1 kg)  06/22/23 225 lb 1.4 oz (102.1 kg)    GEN: Well nourished, well developed in no acute distress NECK: No JVD; No carotid bruits CARDIAC: ***RRR, no murmurs, rubs, gallops RESPIRATORY:  Clear to auscultation without rales, wheezing or rhonchi  ABDOMEN: Soft, non-tender, non-distended EXTREMITIES:  No edema; No deformity   ASSESSMENT AND PLAN: .   ***    {Are you ordering a CV Procedure (e.g. stress test, cath, DCCV, TEE, etc)?   Press F2        :[id number]}    Signed, Bettey Mare. Liborio Nixon, ANP, AACC

## 2023-08-21 ENCOUNTER — Ambulatory Visit: Payer: 59 | Admitting: Orthopedic Surgery

## 2023-08-22 ENCOUNTER — Ambulatory Visit: Payer: 59 | Admitting: Adult Health

## 2023-08-25 ENCOUNTER — Other Ambulatory Visit: Payer: Self-pay | Admitting: Cardiovascular Disease

## 2023-08-25 ENCOUNTER — Telehealth: Payer: Self-pay | Admitting: Cardiovascular Disease

## 2023-08-25 MED ORDER — POTASSIUM CHLORIDE CRYS ER 20 MEQ PO TBCR: 20.0000 meq | EXTENDED_RELEASE_TABLET | Freq: Every day | ORAL | 3 refills | Status: DC

## 2023-08-25 NOTE — Telephone Encounter (Signed)
RN called [employer] - spoke to [employer] informed pharm tech  - Metoprolol will not reordered. Patient stated she does not take medication   She verbalized understanding.

## 2023-08-25 NOTE — Telephone Encounter (Signed)
Pt is requesting a refill on metoprolol. This medication was D/C off of pt's medication list. Would Dr. Flora Lipps like for the pt to continue taking this medication? Please address

## 2023-08-25 NOTE — Telephone Encounter (Signed)
*  STAT* If patient is at the pharmacy, call can be transferred to refill team.   1. Which medications need to be refilled? (please list name of each medication and dose if known)  Metoprolol and Potassium Chloride   2. Would you like to learn more about the convenience, safety, & potential cost savings by using the [facility]?      3. Are you open to using the [facility] (Type [facility].    4. Which pharmacy/location (including street and city if local pharmacy) is medication to be sent to?[facility]   5. Do they need a 30 day or 90 day supply? 90 days and refills

## 2023-08-25 NOTE — Telephone Encounter (Signed)
Called spoke to patient . per chart review,on 07/17/23  - patient preference to stop medication - Metoprolol was taken removed from her list.   RN asked patient if she is taking Metoprolol . Patient stated no,  RN asked if she had medication on automatic refill Patient states she does not.  RN in formed patient will not be refilling medication.  Will contact pharmacy. Patient voiced understanding.

## 2023-08-26 NOTE — Progress Notes (Unsigned)
Cardiology Office Note:  .   Date:  08/28/2023  ID:  [name], DOB 1960/11/20, MRN [id number] PCP: Rebekah Chesterfield, NP  [facility] Providers Cardiologist:  Reatha Harps, MD { History of Present Illness: Susan Kitchen   [name] is a 63 y.o. female with history of HFpEF, nonobstructive CAD, HLD, tobacco abuse.  Last seen in the office on 08/09/2023 at which time she had evidence of volume overload with shortness of breath chest discomfort and edema with a weight increase of 10 pounds. She continues to smoke about a pack a day and has attempted to quit in the past without success. She is also on Synjardy for diabetes management and takes aspirin and a cholesterol medication.  She admits to eating high sodium diet.  She was started on torsemide 40 mg daily when last seen by Dr. Flora Lipps.  She comes today with continued complaints of lower extremity edema bilaterally.  It is some better but not completely resolved.  She has gained about 5 pounds.  She states she is trying to reduce her salt intake.  She has been on the torsemide for 2 weeks 40 mg daily and has had moderate response.  Unfortunately she continues to smoke and is not completely eliminated the salt.  She also has been keeping her legs in a dependent position.  She does not sleep in a bed but sleeps on the couch in a reclined position.  She has not been using CPAP as directed as she has been treated for pneumonia by primary care.  She denies chest pain or dyspnea on exertion but she is quite sedentary.  ROS: As above otherwise negative  Studies Reviewed: .   Echocardiogram 11/03/2022     1. Left ventricular ejection fraction, by estimation, is 60 to 65%. The  left ventricle has normal function. The left ventricle has no regional  wall motion abnormalities. Left ventricular diastolic parameters are  consistent with Grade I diastolic  dysfunction (impaired relaxation).   2. Right ventricular systolic function is normal.  The right ventricular  size is normal.   3. The mitral valve is normal in structure. Trivial mitral valve  regurgitation. No evidence of mitral stenosis.   4. The aortic valve is tricuspid. There is mild calcification of the  aortic valve. Aortic valve regurgitation is trivial. No aortic stenosis is  present.   5. The inferior vena cava is normal in size with greater than 50%  respiratory variability, suggesting right atrial pressure of 3 mmHg.   Physical Exam:   VS:  BP (!) 122/52 (BP Location: Left Arm, Patient Position: Sitting, Cuff Size: Large)   Pulse 88   Ht 5\' 4"  (1.626 m)   Wt 242 lb 9.6 oz (110 kg)   SpO2 95%   BMI 41.64 kg/m    Wt Readings from Last 3 Encounters:  08/28/23 242 lb 9.6 oz (110 kg)  08/09/23 237 lb (107.5 kg)  06/27/23 225 lb 1.4 oz (102.1 kg)    GEN: Well nourished, well developed in no acute distress NECK: No JVD; No carotid bruits CARDIAC: RRR, distant heart sounds no murmurs, rubs, gallops RESPIRATORY:  Clear to auscultation without rales, wheezing or rhonchi  ABDOMEN: Soft, non-tender, non-distended EXTREMITIES: Tight bilateral lower extremity edema from the knees to the feet, some lacerations are seen but no signs of infection  No deformity   ASSESSMENT AND PLAN: .    Chronic diastolic CHF: Continue to TAVR bilateral lower extremity edema to  the knees.  Tight.  I will increase the torsemide to 40 mg twice a day with increased dose of potassium to 40 mill equivalents in the morning, and 20 mill equivalents in the afternoon with the afternoon dose.  She is on spironolactone and has been taking a full tablet instead of a half a tablet.  I will check a BMET and a BNP today.  She will come back in a week.  If no changes would likely need to be hospitalized.  I considered adding Furosix injection but this will have to be ordered.  I told her if she continues to gain weight and edema becomes worse she should present to the ED where she will need to be admitted  for IV diuretics.  Will see her in 1 week to evaluate her response to medication.  I am repeating her echocardiogram for changes in LV function and diastolic function as 1 was done back in April 2024 but she has new symptoms.  2.  Chronic hypertension: Blood pressure is very well-controlled today.  No changes in her medication regimen.  She is to work harder on a low-sodium diet.  3.  Hyperlipidemia: Currently not on treatment.  Lipids should be monitored.  She is being followed closely by PCP.  If this is not completed on next office visit this may need to be considered.  4.  Ongoing tobacco abuse: The patient states she is trying to stop smoking but is become very hard.  May need to consider assistance with Chantix however she is on's a lot of psychiatric medications and this will need to be bedded through primary care.         Signed, Bettey Mare. Liborio Nixon, ANP, AACC

## 2023-08-28 ENCOUNTER — Ambulatory Visit: Payer: 59 | Attending: Adult Health | Admitting: Adult Health

## 2023-08-28 ENCOUNTER — Telehealth: Payer: Self-pay | Admitting: Cardiovascular Disease

## 2023-08-28 ENCOUNTER — Telehealth: Payer: Self-pay

## 2023-08-28 ENCOUNTER — Encounter: Payer: Self-pay | Admitting: Adult Health

## 2023-08-28 VITALS — BP 122/52 | HR 88 | Ht 64.0 in | Wt 242.6 lb

## 2023-08-28 DIAGNOSIS — I5033 Acute on chronic diastolic (congestive) heart failure: Secondary | ICD-10-CM | POA: Diagnosis not present

## 2023-08-28 DIAGNOSIS — I10 Essential (primary) hypertension: Secondary | ICD-10-CM | POA: Diagnosis not present

## 2023-08-28 DIAGNOSIS — E7800 Pure hypercholesterolemia, unspecified: Secondary | ICD-10-CM | POA: Diagnosis not present

## 2023-08-28 DIAGNOSIS — Z720 Tobacco use: Secondary | ICD-10-CM | POA: Diagnosis not present

## 2023-08-28 NOTE — Telephone Encounter (Signed)
Message sent Samara Deist Lawrence's cma for clarification.

## 2023-08-28 NOTE — Telephone Encounter (Signed)
Pt c/o medication issue:  1. Name of Medication:   potassium chloride SA (KLOR-CON M20) 20 MEQ tablet   2. How are you currently taking this medication (dosage and times per day)?   3. Are you having a reaction (difficulty breathing--STAT)?   4. What is your medication issue?   Caller Bjorn Loser) stated they will need clarification on patient's instructions for this medication.  Caller noted patient will need an updated prescription for 90 days sent to [facility] [place], [place] - [address].

## 2023-08-28 NOTE — Telephone Encounter (Signed)
Returned call to patient regarding medication instructions. Patient stated did not have any questions regarding medication .

## 2023-08-28 NOTE — Patient Instructions (Signed)
Medication Instructions:  Torsemide 40 mg ( Take 40 mg Twice Daily. Take on An Empty Stomach). Potassium 20 meq ( Take 2 Tablets in the morning . Take 1 Tablet in The Evening , On an Empty stomach). *If you need a refill on your cardiac medications before your next appointment, please call your pharmacy*   Lab Work: BMET , BNP  If you have labs (blood work) drawn today and your tests are completely normal, you will receive your results only by: MyChart Message (if you have MyChart) OR A paper copy in the mail If you have any lab test that is abnormal or we need to change your treatment, we will call you to review the results.   Testing/Procedures: [address], [address]. Your physician has requested that you have an echocardiogram. Echocardiography is a painless test that uses sound waves to create images of your heart. It provides your doctor with information about the size and shape of your heart and how well your heart's chambers and valves are working. This procedure takes approximately one hour. There are no restrictions for this procedure. Please do NOT wear cologne, perfume, aftershave, or lotions (deodorant is allowed). Please arrive 15 minutes prior to your appointment time.  Please note: We ask at that you not bring children with you during ultrasound (echo/ vascular) testing. Due to room size and safety concerns, children are not allowed in the ultrasound rooms during exams. Our front office staff cannot provide observation of children in our lobby area while testing is being conducted. An adult accompanying a patient to their appointment will only be allowed in the ultrasound room at the discretion of the ultrasound technician under special circumstances. We apologize for any inconvenience.    Follow-Up: At [facility], you and your health needs are our priority.  As part of our continuing mission to provide you with exceptional heart care, we have created  designated Provider Care Teams.  These Care Teams include your primary Cardiologist (physician) and Advanced Practice Providers (APPs -  Physician Assistants and Nurse Practitioners) who all work together to provide you with the care you need, when you need it.  We recommend signing up for the patient portal called "MyChart".  Sign up information is provided on this After Visit Summary.  MyChart is used to connect with patients for Virtual Visits (Telemedicine).  Patients are able to view lab/test results, encounter notes, upcoming appointments, etc.  Non-urgent messages can be sent to your provider as well.   To learn more about what you can do with MyChart, go to [email or web address].    Your next appointment:   1 week(s)  Provider:   First Available APP

## 2023-08-29 ENCOUNTER — Telehealth: Payer: Self-pay | Admitting: Cardiovascular Disease

## 2023-08-29 ENCOUNTER — Other Ambulatory Visit: Payer: Self-pay | Admitting: Cardiovascular Disease

## 2023-08-29 LAB — BASIC METABOLIC PANEL
BUN/Creatinine Ratio: 12 (ref 12–28)
BUN: 11 mg/dL (ref 8–27)
CO2: 23 mmol/L (ref 20–29)
Calcium: 9 mg/dL (ref 8.7–10.3)
Chloride: 102 mmol/L (ref 96–106)
Creatinine, Ser: 0.91 mg/dL (ref 0.57–1.00)
Glucose: 92 mg/dL (ref 70–99)
Potassium: 4.8 mmol/L (ref 3.5–5.2)
Sodium: 139 mmol/L (ref 134–144)
eGFR: 72 mL/min/{1.73_m2} (ref >59)

## 2023-08-29 LAB — BRAIN NATRIURETIC PEPTIDE: BNP: 3.8 pg/mL (ref 0.0–100.0)

## 2023-08-29 NOTE — Telephone Encounter (Signed)
Pt called in stating her pharmacy told her that the potassium pills need to be sent over again -   potassium chloride SA (KLOR-CON M20) 20 MEQ tablet   [facility] - [place], [place] - [address]

## 2023-09-01 ENCOUNTER — Telehealth: Payer: Self-pay

## 2023-09-01 NOTE — Progress Notes (Unsigned)
 Cardiology Clinic Note   Patient Name: [name] Date of Encounter: 09/05/2023  Primary Care Provider:  Rebekah Chesterfield, NP Primary Cardiologist:  Reatha Harps, MD  Patient Profile    [name] 63 year old female presents to the clinic today for follow-up evaluation of her essential hypertension and acute on chronic diastolic CHF.  Past Medical History    Past Medical History:  Diagnosis Date   Anxiety    Arthritis    Asthma    Cancer (HCC)    cervical cancer; pt states a long long time ago   CHF (congestive heart failure) (HCC)    Coronary artery disease    Depression    Diabetes mellitus without complication (HCC)    Dyspnea    Edema of all four extremities    Essential (primary) hypertension 06/24/2020   Hyperlipemia    Type II diabetes mellitus (HCC) 03/22/2022   Past Surgical History:  Procedure Laterality Date   ABDOMINAL HYSTERECTOMY     ANTERIOR LAT LUMBAR FUSION N/A 11/05/2019   Procedure: Lumbar Four-Five Anterolateral lumbar interbody fusion with lateral plate;  Surgeon: Barnett Abu, MD;  Location: [facility];  Service: Neurosurgery;  Laterality: N/A;  anterolateral   BACK SURGERY     pins   BACK SURGERY  07/11/2008   BIOPSY  06/29/2021   Procedure: BIOPSY;  Surgeon: Dolores Frame, MD;  Location: [facility];  Service: Gastroenterology;;   BLADDER REPAIR     CARPAL TUNNEL RELEASE Right    CESAREAN SECTION  1982, 1984   COLONOSCOPY N/A 06/12/2013   rehman: normal   COLONOSCOPY WITH PROPOFOL N/A 06/27/2023   Procedure: COLONOSCOPY WITH PROPOFOL;  Surgeon: Franky Macho, MD;  Location: [facility];  Service: Endoscopy;  Laterality: N/A;  2:00PM;ASA 3   CORONARY PRESSURE/FFR STUDY N/A 10/20/2022   Procedure: CORONARY PRESSURE/FFR STUDY;  Surgeon: [name], MD;  Location: [facility];  Service: Cardiovascular;  Laterality: N/A;   CYSTOSCOPY WITH INJECTION N/A 03/23/2023   Procedure: CYSTOSCOPY WITH  INJECTION-botox;  Surgeon: Malen Gauze, MD;  Location: [facility];  Service: Urology;  Laterality: N/A;   ESOPHAGOGASTRODUODENOSCOPY (EGD) WITH PROPOFOL N/A 06/29/2021   Procedure: ESOPHAGOGASTRODUODENOSCOPY (EGD) WITH PROPOFOL;  Surgeon: Dolores Frame, MD;  Location: [facility];  Service: Gastroenterology;  Laterality: N/A;  8:40   KNEE ARTHROSCOPY WITH MEDIAL MENISECTOMY Right 05/09/2019   Procedure: KNEE ARTHROSCOPY WITH MEDIAL MENISECTOMY AND LATERAL MENISECTOMY;  Surgeon: Vickki Hearing, MD;  Location: [facility];  Service: Orthopedics;  Laterality: Right;   KNEE ARTHROSCOPY WITH MEDIAL MENISECTOMY Left 06/27/2019   Procedure: KNEE ARTHROSCOPY WITH MEDIAL MENISCECTOMY;  Surgeon: Vickki Hearing, MD;  Location: [facility];  Service: Orthopedics;  Laterality: Left;   LEFT HEART CATH AND CORONARY ANGIOGRAPHY N/A 10/20/2022   Procedure: LEFT HEART CATH AND CORONARY ANGIOGRAPHY;  Surgeon: [name], MD;  [facility]: [facility];  Service: Cardiovascular;  Laterality: N/A;   POLYPECTOMY  06/27/2023   Procedure: POLYPECTOMY;  Surgeon: Franky Macho, MD;  [facility];  Service: Endoscopy;;   TOTAL KNEE ARTHROPLASTY Left 03/15/2022   Procedure: TOTAL KNEE ARTHROPLASTY;  Surgeon: Vickki Hearing, MD;  Location: [facility];  Service: Orthopedics;  Laterality: Left;   TOTAL KNEE ARTHROPLASTY Right 02/21/2023   Procedure: TOTAL KNEE ARTHROPLASTY;  Surgeon: Vickki Hearing, MD;  Location: [facility];  Service: Orthopedics;  Laterality: Right;    Allergies  No Known Allergies  History of Present Illness    [name] has a PMH of acute on chronic diastolic CHF, essential hypertension, HLD, and tobacco abuse.  Her PMH also includes nonobstructive CAD.  She was seen 08/09/2023.  At that time she was noted to have fluid volume overload with shortness of breath and lower extremity swelling.  She had a weight increase of 10 pounds.  She continued to smoke  about a pack per day and had attempted to quit in the past without success.  She was on Synjardy for diabetes and taking aspirin and a atorvastatin for cholesterol management.  She reported dietary indiscretion.  She had been started on torsemide 40 mg daily by Dr. Flora Lipps..  She was seen in follow-up 08/28/2023 by Joni Reining, DNP.  She continued to have lower extremity swelling bilaterally.  It was improving however not totally resolved.  She had gained about 5 pounds.  She was trying to reduce her sodium intake.  She had been on torsemide for 2 weeks at 40 mg daily with moderate response.  She also continued to smoke.  She had not elevated her legs.  She was sleeping on the couch in a reclined position.  She reported compliance with her CPAP.  She was being treated for pneumonia by her PCP.  She denied chest pain and DOE.  She was noted to be sedentary.  Her blood pressure was noted to be 122/52.  Her torsemide was increased to twice daily and 20 mill equivalents of potassium was added to her medication regimen.  BMP and BNP were ordered and were reassuring.  She presents to the clinic today for follow-up evaluation and states she is feeling better.  She has completed treatment for her pneumonia.  Her weight is down 10 pounds.  We reviewed the importance of fluid restriction and low-sodium diet.  She and her husband expressed understanding.  There was some confusion about potassium dosing.  She was taking 40 mill equivalents instead of 20.  I will order a BMP today.  Reduce her torsemide to 40 mg daily and change her potassium to 10 mill equivalents daily.  She has follow-up echocardiogram scheduled for March.  Will plan follow-up in 3 months.  Today she denies chest pain, shortness of breath, increased lower extremity edema, fatigue, palpitations, melena, hematuria, hemoptysis, diaphoresis, weakness, presyncope, syncope, orthopnea, and PND.     Home Medications    Prior to Admission medications    Medication Sig Start Date End Date Taking? Authorizing Provider  acetaminophen (TYLENOL) 500 MG tablet Take 500 mg by mouth every 6 (six) hours as needed for moderate pain (pain score 4-6).    [provider]  albuterol (VENTOLIN HFA) 108 (90 Base) MCG/ACT inhaler Inhale 1-2 puffs into the lungs every 6 (six) hours as needed for wheezing or shortness of breath.    [provider]  amoxicillin-clavulanate (AUGMENTIN) 875-125 MG tablet Take 1 tablet by mouth every 12 (twelve) hours. 07/17/23   Lonell Grandchild, MD  ARIPiprazole (ABILIFY) 2 MG tablet Take 2 mg by mouth daily. 10/10/22   [provider]  aspirin 81 MG chewable tablet Chew 1 tablet (81 mg total) by mouth 2 (two) times daily. 08/09/23   Sande Rives, MD  atorvastatin (LIPITOR) 80 MG tablet Take 0.5 tablets (40 mg total) by mouth at bedtime. 08/09/23 08/03/24  O'NealRonnald Ramp, MD  celecoxib (CELEBREX) 100 MG capsule Take 100 mg by mouth 2 (two) times daily.    [provider]  Empagliflozin-metFORMIN HCl ER (SYNJARDY XR) 12.11-998 MG TB24 Take 1 tablet by mouth daily.    [provider]  estradiol (ESTRACE) 2 MG tablet TAKE ONE TABLET AT BEDTIME 01/04/23   Lazaro Arms, MD  FLUoxetine (PROZAC) 40 MG capsule Take 40 mg by mouth daily. 12/06/22   [provider]  gabapentin (NEURONTIN) 300 MG capsule Take 1 capsule (300 mg total) by mouth 3 (three) times daily. 08/16/19   Vickki Hearing, MD  loratadine (CLARITIN) 10 MG tablet Take 10 mg by mouth daily.    [provider]  nitroGLYCERIN (NITROSTAT) 0.4 MG SL tablet Place 1 tablet (0.4 mg total) under the tongue every 5 (five) minutes as needed for chest pain. 08/09/23 11/07/23  O'NealRonnald Ramp, MD  oxyCODONE-acetaminophen (PERCOCET) 7.5-325 MG tablet Take 1 tablet by mouth 4 (four) times daily. 05/19/23   [provider]  potassium chloride SA (KLOR-CON M20) 20 MEQ tablet Take 1 tablet (20 mEq total) by  mouth daily. Patient taking differently: Take 20 mEq by mouth 2 (two) times daily. Take 40 meq in The Morning . 20 meq in The Evening. 08/25/23   O'NealRonnald Ramp, MD  pseudoephedrine-guaifenesin (MUCINEX D) 60-600 MG 12 hr tablet Take 1 tablet by mouth every 12 (twelve) hours.    [provider]  spironolactone (ALDACTONE) 25 MG tablet Take 0.5 tablets (12.5 mg total) by mouth daily. 08/09/23   Sande Rives, MD  torsemide (DEMADEX) 20 MG tablet Take 2 tablets (40 mg total) by mouth daily. Patient taking differently: Take 40 mg by mouth 2 (two) times daily. Take 1 Tablet Twice Daily Before Food. 08/09/23   O'NealRonnald Ramp, MD  traMADol (ULTRAM) 50 MG tablet Take 1 tablet (50 mg total) by mouth every 6 (six) hours as needed. 03/23/23 03/22/24  Malen Gauze, MD  traZODone (DESYREL) 100 MG tablet Take 200 mg by mouth at bedtime.    [provider]    Family History    Family History  Problem Relation Age of Onset   Cancer Paternal Grandfather    Cancer Paternal Grandmother    Cancer Maternal Grandmother    Heart disease Father    Lung cancer Mother    Other Brother        gunshot   Colon cancer Other    She indicated that her mother is deceased. She indicated that her father is deceased. She indicated that both of her sisters are alive. She indicated that only one of her two brothers is alive. She indicated that her maternal grandmother is deceased. She indicated that her maternal grandfather is deceased. She indicated that her paternal grandmother is deceased. She indicated that her paternal grandfather is deceased. She indicated that her child is alive. She indicated that only one of her two others is alive.  Social History    Social History   Socioeconomic History   Marital status: Single    Spouse name: Not on file   Number of children: 2   Years of education: Not on file   Highest education level: Not on file  Occupational History    Occupation: Disabled  Tobacco Use   Smoking status: Former    Current packs/day: 1.00    Average packs/day: 1 pack/day for 20.0 years (20.0 ttl pk-yrs)    Types: Cigarettes   Smokeless tobacco: Never   Tobacco comments:    trying to quit  Vaping Use   Vaping status: Former  Substance and  Sexual Activity   Alcohol use: No    Alcohol/week: 0.0 standard drinks of alcohol   Drug use: No   Sexual activity: Not Currently    Birth control/protection: Surgical    Comment: hyst  Other Topics Concern   Not on file  Social History Narrative   ** Merged History Encounter **       Social Drivers of Health   Financial Resource Strain: Low Risk  (10/06/2022)   Overall Financial Resource Strain (CARDIA)    Difficulty of Paying Living Expenses: Not very hard  Food Insecurity: No Food Insecurity (02/21/2023)   Hunger Vital Sign    Worried About Running Out of Food in the Last Year: Never true    Ran Out of Food in the Last Year: Never true  Transportation Needs: No Transportation Needs (02/21/2023)   PRAPARE - Administrator, Civil Service (Medical): No    Lack of Transportation (Non-Medical): No  Physical Activity: Inactive (10/06/2022)   Exercise Vital Sign    Days of Exercise per Week: 0 days    Minutes of Exercise per Session: 0 min  Stress: No Stress Concern Present (10/06/2022)   [employer] of Occupational Health - Occupational Stress Questionnaire    Feeling of Stress : Not at all  Social Connections: Socially Isolated (10/06/2022)   Social Connection and Isolation Panel [NHANES]    Frequency of Communication with Friends and Family: Once a week    Frequency of Social Gatherings with Friends and Family: Once a week    Attends Religious Services: Never    Database administrator or Organizations: No    Attends Banker Meetings: Never    Marital Status: Divorced  Catering manager Violence: Not At Risk (02/21/2023)   Humiliation, Afraid, Rape, and Kick  questionnaire    Fear of Current or Ex-Partner: No    Emotionally Abused: No    Physically Abused: No    Sexually Abused: No     Review of Systems    General:  No chills, fever, night sweats or weight changes.  Cardiovascular:  No chest pain, dyspnea on exertion, edema, orthopnea, palpitations, paroxysmal nocturnal dyspnea. Dermatological: No rash, lesions/masses Respiratory: No cough, dyspnea Urologic: No hematuria, dysuria Abdominal:   No nausea, vomiting, diarrhea, bright red blood per rectum, melena, or hematemesis Neurologic:  No visual changes, wkns, changes in mental status. All other systems reviewed and are otherwise negative except as noted above.  Physical Exam    VS:  BP 118/66 (BP Location: Left Arm, Patient Position: Sitting, Cuff Size: Large)   Pulse 89   Ht 5\' 4"  (1.626 m)   Wt 232 lb 6.4 oz (105.4 kg)   SpO2 94%   BMI 39.89 kg/m  , BMI Body mass index is 39.89 kg/m. GEN: Well nourished, well developed, in no acute distress. HEENT: normal. Neck: Supple, no JVD, carotid bruits, or masses. Cardiac: RRR, no murmurs, rubs, or gallops. No clubbing, cyanosis, generalized bilateral lower extremity edema.  Radials/DP/PT 2+ and equal bilaterally.  Respiratory:  Respirations regular and unlabored, clear to auscultation bilaterally. GI: Soft, nontender, nondistended, BS + x 4. MS: no deformity or atrophy. Skin: warm and dry, no rash. Neuro:  Strength and sensation are intact. Psych: Normal affect.  Accessory Clinical Findings    Recent Labs: 07/17/2023: Hemoglobin 10.3; Platelets 290 08/28/2023: BNP 3.8; BUN 11; Creatinine, Ser 0.91; Potassium 4.8; Sodium 139   Recent Lipid Panel    Component Value Date/Time   CHOL  127 10/17/2022 1321   TRIG 125 10/17/2022 1321   HDL 44 10/17/2022 1321   CHOLHDL 2.9 10/17/2022 1321   LDLCALC 61 10/17/2022 1321         ECG personally reviewed by me today-none today.    Echocardiogram 11/03/2022  IMPRESSIONS     1. Left  ventricular ejection fraction, by estimation, is 60 to 65%. The  left ventricle has normal function. The left ventricle has no regional  wall motion abnormalities. Left ventricular diastolic parameters are  consistent with Grade I diastolic  dysfunction (impaired relaxation).   2. Right ventricular systolic function is normal. The right ventricular  size is normal.   3. The mitral valve is normal in structure. Trivial mitral valve  regurgitation. No evidence of mitral stenosis.   4. The aortic valve is tricuspid. There is mild calcification of the  aortic valve. Aortic valve regurgitation is trivial. No aortic stenosis is  present.   5. The inferior vena cava is normal in size with greater than 50%  respiratory variability, suggesting right atrial pressure of 3 mmHg.   FINDINGS   Left Ventricle: Left ventricular ejection fraction, by estimation, is 60  to 65%. The left ventricle has normal function. The left ventricle has no  regional wall motion abnormalities. The left ventricular internal cavity  size was normal in size. There is   no left ventricular hypertrophy. Left ventricular diastolic parameters  are consistent with Grade I diastolic dysfunction (impaired relaxation).   Right Ventricle: The right ventricular size is normal. No increase in  right ventricular wall thickness. Right ventricular systolic function is  normal.   Left Atrium: Left atrial size was normal in size.   Right Atrium: Right atrial size was normal in size.   Pericardium: There is no evidence of pericardial effusion.   Mitral Valve: The mitral valve is normal in structure. Trivial mitral  valve regurgitation. No evidence of mitral valve stenosis.   Tricuspid Valve: The tricuspid valve is normal in structure. Tricuspid  valve regurgitation is trivial. No evidence of tricuspid stenosis.   Aortic Valve: The aortic valve is tricuspid. There is mild calcification  of the aortic valve. Aortic valve  regurgitation is trivial. Aortic  regurgitation PHT measures 363 msec. No aortic stenosis is present.   Pulmonic Valve: The pulmonic valve was normal in structure. Pulmonic valve  regurgitation is trivial. No evidence of pulmonic stenosis.   Aorta: The aortic root is normal in size and structure.   Venous: The inferior vena cava is normal in size with greater than 50%  respiratory variability, suggesting right atrial pressure of 3 mmHg.   IAS/Shunts: No atrial level shunt detected by color flow Doppler.        Assessment & Plan   1.  Chronic diastolic CHF-weight today 232.4 pounds.  Slight generalized bilateral lower extremity edema.  Continues to work on reducing salt in her diet.  Echocardiogram was ordered but has not yet been completed, scheduled for March. Encouraged daily weights-contact office with a weight increase of 2 to 3 pounds overnight or 5 pounds in 1 week Reduce 40 mg daily torsemide Continue potassium 10 mill equivalents daily Heart healthy low-sodium diet-salty 6 diet sheet given Elevate lower extremities when not active Lower extremity support stockings-recommended Order BMP Fluid restriction-less than 50 ounces per day.  Essential hypertension-BP today 118/66. Maintain blood pressure log Continue spironolactone, torsemide  Hyperlipidemia-LDL 61 on 10/17/2022. High-fiber diet Continue atorvastatin, aspirin  Disposition: Follow-up with Dr. Flora Lipps or me in  3 months.   Thomasene Ripple. Dalisha Shively NP-C     09/05/2023, 9:01 AM [facility] [address] Office [phone] Fax [phone]    I spent 14 minutes examining this patient, reviewing medications, and using patient centered shared decision making involving their cardiac care.   I spent  20 minutes reviewing past medical history,  medications, and prior cardiac tests.

## 2023-09-01 NOTE — Telephone Encounter (Addendum)
Results viewed by patient via MyChart.----- Message from Joni Reining sent at 08/30/2023  1:09 PM EST ----- BNP is not elevated. This is a good sign. IF she continues to have lower extremity swelling on follow up office visit with the increased dose of torsemide, she may need hospitalization with IV lasix.

## 2023-09-01 NOTE — Telephone Encounter (Addendum)
Results viewed by patient via MyChart.----- Message from Joni Reining sent at 08/29/2023  7:35 AM EST ----- I have reviewed her labs.  They are normal. Still waiting on the BNP>  KL

## 2023-09-05 ENCOUNTER — Encounter: Payer: Self-pay | Admitting: General Practice

## 2023-09-05 ENCOUNTER — Ambulatory Visit: Payer: 59 | Attending: General Practice | Admitting: General Practice

## 2023-09-05 VITALS — BP 118/66 | HR 89 | Ht 64.0 in | Wt 232.4 lb

## 2023-09-05 DIAGNOSIS — E7800 Pure hypercholesterolemia, unspecified: Secondary | ICD-10-CM | POA: Diagnosis not present

## 2023-09-05 DIAGNOSIS — I5032 Chronic diastolic (congestive) heart failure: Secondary | ICD-10-CM | POA: Diagnosis not present

## 2023-09-05 DIAGNOSIS — Z720 Tobacco use: Secondary | ICD-10-CM

## 2023-09-05 DIAGNOSIS — I10 Essential (primary) hypertension: Secondary | ICD-10-CM

## 2023-09-05 LAB — BASIC METABOLIC PANEL
BUN/Creatinine Ratio: 14 (ref 12–28)
BUN: 15 mg/dL (ref 8–27)
CO2: 29 mmol/L (ref 20–29)
Calcium: 9.7 mg/dL (ref 8.7–10.3)
Chloride: 94 mmol/L — ABNORMAL LOW (ref 96–106)
Creatinine, Ser: 1.07 mg/dL — ABNORMAL HIGH (ref 0.57–1.00)
Glucose: 94 mg/dL (ref 70–99)
Potassium: 3.8 mmol/L (ref 3.5–5.2)
Sodium: 141 mmol/L (ref 134–144)
eGFR: 59 mL/min/{1.73_m2} — ABNORMAL LOW (ref >59)

## 2023-09-05 MED ORDER — TORSEMIDE 20 MG PO TABS: 40.0000 mg | ORAL_TABLET | Freq: Every day | ORAL | 3 refills | Status: DC

## 2023-09-05 MED ORDER — POTASSIUM CHLORIDE CRYS ER 10 MEQ PO TBCR
10.0000 meq | EXTENDED_RELEASE_TABLET | Freq: Every day | ORAL | 3 refills | Status: DC
Start: 2023-09-05 — End: 2023-12-06

## 2023-09-05 NOTE — Patient Instructions (Signed)
 Medication Instructions:  TAKE TORSEMIDE 40MG  DAILY TAKE POTASSIUM DAILY *If you need a refill on your cardiac medications before your next appointment, please call your pharmacy*  Lab Work: BMET TODAY If you have labs (blood work) drawn today and your tests are completely normal, you will receive your results only by: MyChart Message (if you have MyChart) OR A paper copy in the mail If you have any lab test that is abnormal or we need to change your treatment, we will call you to review the results.  Other Instructions FLUID RESTRICTION LESS THAN 48 OUNCES DAILY ELEVATE LOWER EXTREMITIES WHEN NOT ACTIVE, SITTING PLEASE READ AND FOLLOW ATTACHED  SALTY 6   Follow-Up: At [facility], you and your health needs are our priority.  As part of our continuing mission to provide you with exceptional heart care, we have created designated Provider Care Teams.  These Care Teams include your primary Cardiologist (physician) and Advanced Practice Providers (APPs -  Physician Assistants and Nurse Practitioners) who all work together to provide you with the care you need, when you need it.  Your next appointment:   3 month(s)  Provider:   Reatha Harps, MD  or Edd Fabian, FNP

## 2023-09-06 NOTE — Progress Notes (Unsigned)
 Name: [name] DOB: [birth year] MRN: [id number]  History of Present Illness: [name] is a 63 y.o. female who presents today for follow up visit at [facility].  - GU history: 1. Stress urinary incontinence (resolved s/p MUS). 2. OAB with urinary frequency, nocturia, urgency, and uge incontinence. - Neurogenic risk factors: T2DM and spinal stenosis / prior back surgeries. - Exacerbating factors: Diuretic use and caffeine intake. - Refractory to medications including Oxybutynin, Vesicare, and Myrbetriq.  - Improved with bladder Botox injection (100 units). 3. Lichen sclerosus. 4. Vaginal atrophy.  5. 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 As per Dr. Laverle Hobby note on 09/30/2021. 6. Not sexually active.   At last visit on 04/05/2023: - Symptoms much improved after bladder Botox injection (100 units) on 03/23/2023. - Advised the following: Continue Myrbetriq 50 mg daily. Continue using topical vaginal estrogen cream 2x/week. Return in about 5 months (around 09/05/2023) for UA, PVR, & f/u with Evette Georges NP.  Today: She reports suspected UTI. Reports dysuria x1 weeks. She denies fevers, abdominal/flank pain, gross hematuria, hesitancy, straining to void, or sensations of incomplete emptying. She reports that the Botox effects seem to have worn off because her baseline urinary frequency, urgency, and urge incontinence have come back.   She was previously using topical vaginal estrogen cream but discontinued that recently when she ran out.    Fall Screening: Do you usually have a device to assist in your mobility? No   Medications: Current Outpatient Medications  Medication Sig Dispense Refill   acetaminophen (TYLENOL) 500 MG tablet Take 500 mg by mouth every 6 (six) hours as needed for moderate pain (pain score 4-6).     albuterol (VENTOLIN HFA) 108 (90 Base) MCG/ACT inhaler Inhale 1-2 puffs into the lungs every 6 (six) hours as needed for wheezing  or shortness of breath.     ARIPiprazole (ABILIFY) 2 MG tablet Take 2 mg by mouth daily.     aspirin 81 MG chewable tablet Chew 1 tablet (81 mg total) by mouth 2 (two) times daily. 90 tablet 0   atorvastatin (LIPITOR) 80 MG tablet Take 0.5 tablets (40 mg total) by mouth at bedtime. 90 tablet 3   celecoxib (CELEBREX) 100 MG capsule Take 100 mg by mouth 2 (two) times daily.     Empagliflozin-metFORMIN HCl ER (SYNJARDY XR) 12.11-998 MG TB24 Take 1 tablet by mouth daily.     estradiol (ESTRACE) 0.1 MG/GM vaginal cream Discard plastic applicator. Insert a blueberry size amount (approximately 1 gram) of cream on fingertip inside vagina at bedtime 2-3 nights per week. For long term use. 30 g 3   estradiol (ESTRACE) 2 MG tablet TAKE ONE TABLET AT BEDTIME 30 tablet 11   FLUoxetine (PROZAC) 40 MG capsule Take 40 mg by mouth daily.     gabapentin (NEURONTIN) 300 MG capsule Take 1 capsule (300 mg total) by mouth 3 (three) times daily. 21 capsule 3   loratadine (CLARITIN) 10 MG tablet Take 10 mg by mouth daily.     nitrofurantoin, macrocrystal-monohydrate, (MACROBID) 100 MG capsule Take 1 capsule (100 mg total) by mouth 2 (two) times daily for 7 days. 14 capsule 0   nitroGLYCERIN (NITROSTAT) 0.4 MG SL tablet Place 1 tablet (0.4 mg total) under the tongue every 5 (five) minutes as needed for chest pain. 25 tablet 3   oxyCODONE-acetaminophen (PERCOCET) 7.5-325 MG tablet Take 1 tablet by mouth 4 (four) times daily.     potassium chloride SA (KLOR-CON M) 10 MEQ tablet Take  1 tablet (10 mEq total) by mouth daily. 30 tablet 3   pseudoephedrine-guaifenesin (MUCINEX D) 60-600 MG 12 hr tablet Take 1 tablet by mouth every 12 (twelve) hours.     spironolactone (ALDACTONE) 25 MG tablet Take 0.5 tablets (12.5 mg total) by mouth daily. 90 tablet 3   torsemide (DEMADEX) 20 MG tablet Take 2 tablets (40 mg total) by mouth daily. 90 tablet 3   traMADol (ULTRAM) 50 MG tablet Take 1 tablet (50 mg total) by mouth every 6 (six)  hours as needed. 5 tablet 0   traZODone (DESYREL) 100 MG tablet Take 200 mg by mouth at bedtime.     No current facility-administered medications for this visit.    Allergies: No Known Allergies  Past Medical History:  Diagnosis Date   Anxiety    Arthritis    Asthma    Cancer (HCC)    cervical cancer; pt states a long long time ago   CHF (congestive heart failure) (HCC)    Coronary artery disease    Depression    Diabetes mellitus without complication (HCC)    Dyspnea    Edema of all four extremities    Essential (primary) hypertension 06/24/2020   Hyperlipemia    Type II diabetes mellitus (HCC) 03/22/2022   Past Surgical History:  Procedure Laterality Date   ABDOMINAL HYSTERECTOMY     ANTERIOR LAT LUMBAR FUSION N/A 11/05/2019   Procedure: Lumbar Four-Five Anterolateral lumbar interbody fusion with lateral plate;  Surgeon: Barnett Abu, MD;  Location: [facility];  Service: Neurosurgery;  Laterality: N/A;  anterolateral   BACK SURGERY     pins   BACK SURGERY  07/11/2008   BIOPSY  06/29/2021   Procedure: BIOPSY;  Surgeon: Dolores Frame, MD;  Location: [facility];  Service: Gastroenterology;;   BLADDER REPAIR     CARPAL TUNNEL RELEASE Right    CESAREAN SECTION  1982, 1984   COLONOSCOPY N/A 06/12/2013   rehman: normal   COLONOSCOPY WITH PROPOFOL N/A 06/27/2023   Procedure: COLONOSCOPY WITH PROPOFOL;  Surgeon: Franky Macho, MD;  Location: [facility];  Service: Endoscopy;  Laterality: N/A;  2:00PM;ASA 3   CORONARY PRESSURE/FFR STUDY N/A 10/20/2022   Procedure: CORONARY PRESSURE/FFR STUDY;  Surgeon: [name], MD;  Location: [facility];  Service: Cardiovascular;  Laterality: N/A;   CYSTOSCOPY WITH INJECTION N/A 03/23/2023   Procedure: CYSTOSCOPY WITH INJECTION-botox;  Surgeon: Malen Gauze, MD;  Location: [facility];  Service: Urology;  Laterality: N/A;   ESOPHAGOGASTRODUODENOSCOPY (EGD) WITH PROPOFOL N/A 06/29/2021   Procedure:  ESOPHAGOGASTRODUODENOSCOPY (EGD) WITH PROPOFOL;  Surgeon: Dolores Frame, MD;  Location: [facility];  Service: Gastroenterology;  Laterality: N/A;  8:40   KNEE ARTHROSCOPY WITH MEDIAL MENISECTOMY Right 05/09/2019   Procedure: KNEE ARTHROSCOPY WITH MEDIAL MENISECTOMY AND LATERAL MENISECTOMY;  Surgeon: Vickki Hearing, MD;  Location: [facility];  Service: Orthopedics;  Laterality: Right;   KNEE ARTHROSCOPY WITH MEDIAL MENISECTOMY Left 06/27/2019   Procedure: KNEE ARTHROSCOPY WITH MEDIAL MENISCECTOMY;  Surgeon: Vickki Hearing, MD;  Location: [facility];  Service: Orthopedics;  Laterality: Left;   LEFT HEART CATH AND CORONARY ANGIOGRAPHY N/A 10/20/2022   Procedure: LEFT HEART CATH AND CORONARY ANGIOGRAPHY;  Surgeon: [name], MD;  Location: [facility];  Service: Cardiovascular;  Laterality: N/A;   POLYPECTOMY  06/27/2023   Procedure: POLYPECTOMY;  Surgeon: Franky Macho, MD;  Location: [facility];  Service: Endoscopy;;   TOTAL KNEE ARTHROPLASTY Left  03/15/2022   Procedure: TOTAL KNEE ARTHROPLASTY;  Surgeon: Vickki Hearing, MD;  Location: [facility];  Service: Orthopedics;  Laterality: Left;   TOTAL KNEE ARTHROPLASTY Right 02/21/2023   Procedure: TOTAL KNEE ARTHROPLASTY;  Surgeon: Vickki Hearing, MD;  Location: [facility];  Service: Orthopedics;  Laterality: Right;   Family History  Problem Relation Age of Onset   Cancer Paternal Grandfather    Cancer Paternal Grandmother    Cancer Maternal Grandmother    Heart disease Father    Lung cancer Mother    Other Brother        gunshot   Colon cancer Other    Social History   Socioeconomic History   Marital status: Single    Spouse name: Not on file   Number of children: 2   Years of education: Not on file   Highest education level: Not on file  Occupational History   Occupation: Disabled  Tobacco Use   Smoking status: Former    Current packs/day: 1.00    Average packs/day: 1 pack/day for 20.0 years (20.0  ttl pk-yrs)    Types: Cigarettes   Smokeless tobacco: Never   Tobacco comments:    trying to quit  Vaping Use   Vaping status: Former  Substance and Sexual Activity   Alcohol use: No    Alcohol/week: 0.0 standard drinks of alcohol   Drug use: No   Sexual activity: Not Currently    Birth control/protection: Surgical    Comment: hyst  Other Topics Concern   Not on file  Social History Narrative   ** Merged History Encounter **       Social Drivers of Health   Financial Resource Strain: Low Risk  (10/06/2022)   Overall Financial Resource Strain (CARDIA)    Difficulty of Paying Living Expenses: Not very hard  Food Insecurity: No Food Insecurity (02/21/2023)   Hunger Vital Sign    Worried About Running Out of Food in the Last Year: Never true    Ran Out of Food in the Last Year: Never true  Transportation Needs: No Transportation Needs (02/21/2023)   PRAPARE - Administrator, Civil Service (Medical): No    Lack of Transportation (Non-Medical): No  Physical Activity: Inactive (10/06/2022)   Exercise Vital Sign    Days of Exercise per Week: 0 days    Minutes of Exercise per Session: 0 min  Stress: No Stress Concern Present (10/06/2022)   [employer] of Occupational Health - Occupational Stress Questionnaire    Feeling of Stress : Not at all  Social Connections: Socially Isolated (10/06/2022)   Social Connection and Isolation Panel [NHANES]    Frequency of Communication with Friends and Family: Once a week    Frequency of Social Gatherings with Friends and Family: Once a week    Attends Religious Services: Never    Database administrator or Organizations: No    Attends Banker Meetings: Never    Marital Status: Divorced  Catering manager Violence: Not At Risk (02/21/2023)   Humiliation, Afraid, Rape, and Kick questionnaire    Fear of Current or Ex-Partner: No    Emotionally Abused: No    Physically Abused: No    Sexually Abused: No    Review of  Systems Constitutional: Patient denies any unintentional weight loss or change in strength lntegumentary: Patient denies any rashes or pruritus Cardiovascular: Patient denies chest pain or syncope Respiratory: Patient denies shortness of breath Gastrointestinal: Patient denies constipation  Musculoskeletal:  Patient denies muscle cramps or weakness Neurologic: Patient denies convulsions or seizures Allergic/Immunologic: Patient denies recent allergic reaction(s) Hematologic/Lymphatic: Patient denies bleeding tendencies Endocrine: Patient denies heat/cold intolerance  GU: As per HPI.  OBJECTIVE Vitals:   09/07/23 0830  BP: 117/71  Pulse: 86  Temp: 98.4 F (36.9 C)   There is no height or weight on file to calculate BMI.  Physical Examination Constitutional: No obvious distress; patient is non-toxic appearing  Cardiovascular: No visible lower extremity edema.  Respiratory: The patient does not have audible wheezing/stridor; respirations do not appear labored  Gastrointestinal: Abdomen non-distended Musculoskeletal: Normal ROM of UEs  Skin: No obvious rashes/open sores  Neurologic: CN 2-12 grossly intact Psychiatric: Answered questions appropriately with normal affect  Hematologic/Lymphatic/Immunologic: No obvious bruises or sites of spontaneous bleeding  Urine microscopy: 11-30 WBC/hpf, 3-10 RBC/hpf, few bacteria PVR: 45 ml  ASSESSMENT OAB (overactive bladder) - Plan: BLADDER SCAN AMB NON-IMAGING, Urinalysis, Routine w reflex microscopic  Urge incontinence - Plan: BLADDER SCAN AMB NON-IMAGING, Urinalysis, Routine w reflex microscopic  Lichen sclerosus et atrophicus - Plan: BLADDER SCAN AMB NON-IMAGING, Urinalysis, Routine w reflex microscopic, estradiol (ESTRACE) 0.1 MG/GM vaginal cream  UTI symptoms - Plan: nitrofurantoin, macrocrystal-monohydrate, (MACROBID) 100 MG capsule, Urine Culture  Abnormal urinalysis - Plan: nitrofurantoin, macrocrystal-monohydrate, (MACROBID)  100 MG capsule, Urine Culture  Abnormal UA. Will check urine culture and treat empirically with Macrobid while awaiting culture results and sensitivities.  She elected to repeat bladder Botox procedure with Dr. Ronne Binning in office for OAB symptom management.   She was advised to restart topical vaginal estrogen cream 2-3 nights per week for her vaginal atrophy, lichen sclerosus, and UTI prevention. Refills sent.  Patient verbalized understanding of and agreement with current plan. All questions were answered.  PLAN Advised the following: 1. Urine culture.  2. Macrobid (Nitrofurantoin) 100 mg 2x/day for 7 days. 3. Use topical vaginal estrogen cream 2-3 nights per week (long term). 4. Return for 1st available cystoscopy / Botox procedure in office with Dr. Ronne Binning.  Orders Placed This Encounter  Procedures   Urine Culture   Urinalysis, Routine w reflex microscopic   BLADDER SCAN AMB NON-IMAGING    It has been explained that the patient is to follow regularly with their PCP in addition to all other providers involved in their care and to follow instructions provided by these respective offices. Patient advised to contact urology clinic if any urologic-pertaining questions, concerns, new symptoms or problems arise in the interim period.  There are no Patient Instructions on file for this visit.  Electronically signed by:  Donnita Falls, FNP   09/07/23    9:57 AM

## 2023-09-07 ENCOUNTER — Ambulatory Visit: Payer: 59 | Admitting: Urology

## 2023-09-07 ENCOUNTER — Encounter: Payer: Self-pay | Admitting: Urology

## 2023-09-07 VITALS — BP 117/71 | HR 86 | Temp 98.4°F

## 2023-09-07 DIAGNOSIS — R399 Unspecified symptoms and signs involving the genitourinary system: Secondary | ICD-10-CM | POA: Diagnosis not present

## 2023-09-07 DIAGNOSIS — N3281 Overactive bladder: Secondary | ICD-10-CM

## 2023-09-07 DIAGNOSIS — N3941 Urge incontinence: Secondary | ICD-10-CM

## 2023-09-07 DIAGNOSIS — R8290 Unspecified abnormal findings in urine: Secondary | ICD-10-CM

## 2023-09-07 DIAGNOSIS — L900 Lichen sclerosus et atrophicus: Secondary | ICD-10-CM

## 2023-09-07 LAB — MICROSCOPIC EXAMINATION

## 2023-09-07 LAB — URINALYSIS, ROUTINE W REFLEX MICROSCOPIC
Bilirubin, UA: NEGATIVE
Ketones, UA: NEGATIVE
Nitrite, UA: NEGATIVE
Protein,UA: NEGATIVE
RBC, UA: NEGATIVE
Specific Gravity, UA: 1.01 (ref 1.005–1.030)
Urobilinogen, Ur: 0.2 mg/dL (ref 0.2–1.0)
pH, UA: 7 (ref 5.0–7.5)

## 2023-09-07 LAB — BLADDER SCAN AMB NON-IMAGING: Scan Result: 45

## 2023-09-07 MED ORDER — ESTRADIOL 0.1 MG/GM VA CREA: TOPICAL_CREAM | VAGINAL | 3 refills | Status: DC

## 2023-09-07 MED ORDER — NITROFURANTOIN MONOHYD MACRO 100 MG PO CAPS: 100.0000 mg | ORAL_CAPSULE | Freq: Two times a day (BID) | ORAL | 0 refills | Status: DC

## 2023-09-11 ENCOUNTER — Other Ambulatory Visit (HOSPITAL_COMMUNITY): Payer: Self-pay | Admitting: Internal Medicine

## 2023-09-11 ENCOUNTER — Telehealth: Payer: Self-pay

## 2023-09-11 ENCOUNTER — Other Ambulatory Visit: Payer: Self-pay | Admitting: Urology

## 2023-09-11 ENCOUNTER — Other Ambulatory Visit: Payer: Self-pay

## 2023-09-11 DIAGNOSIS — Z1231 Encounter for screening mammogram for malignant neoplasm of breast: Secondary | ICD-10-CM

## 2023-09-11 DIAGNOSIS — N390 Urinary tract infection, site not specified: Secondary | ICD-10-CM

## 2023-09-11 DIAGNOSIS — R399 Unspecified symptoms and signs involving the genitourinary system: Secondary | ICD-10-CM

## 2023-09-11 LAB — URINE CULTURE

## 2023-09-11 MED ORDER — CEPHALEXIN 500 MG PO CAPS: 500.0000 mg | ORAL_CAPSULE | Freq: Two times a day (BID) | ORAL | 0 refills | Status: AC

## 2023-09-11 NOTE — Telephone Encounter (Signed)
-----   Message from Donnita Falls sent at 09/11/2023  8:51 AM EST ----- Please let pt know that her urine culture was positive and showed resistance to the antibiotic which was prescribed empirically at the time of her visit on 09/07/23 (Macrobid). She should stop that and start Keflex, which I'm sending to her pharmacy today. Thanks.

## 2023-09-11 NOTE — Progress Notes (Signed)
 Please let pt know that her urine culture was positive and showed resistance to the antibiotic which was prescribed empirically at the time of her visit on 09/07/23 (Macrobid). She should stop that and start Keflex, which I'm sending to her pharmacy today. Thanks.

## 2023-09-11 NOTE — Telephone Encounter (Signed)
 Tried calling patient with no answer and left voiced message to return call to office. Sent results via mychart.

## 2023-09-15 ENCOUNTER — Ambulatory Visit (HOSPITAL_COMMUNITY): Payer: 59 | Attending: Cardiology

## 2023-09-15 DIAGNOSIS — I5033 Acute on chronic diastolic (congestive) heart failure: Secondary | ICD-10-CM | POA: Diagnosis present

## 2023-09-15 LAB — ECHOCARDIOGRAM COMPLETE
Area-P 1/2: 3.36 cm2
P 1/2 time: 347 ms
S' Lateral: 3.2 cm

## 2023-10-23 ENCOUNTER — Ambulatory Visit (HOSPITAL_COMMUNITY)
Admission: RE | Admit: 2023-10-23 | Discharge: 2023-10-23 | Disposition: A | Discharge status: Home / Self Care | Source: Ambulatory Visit | Attending: Internal Medicine | Admitting: Internal Medicine

## 2023-10-23 DIAGNOSIS — Z1231 Encounter for screening mammogram for malignant neoplasm of breast: Secondary | ICD-10-CM | POA: Diagnosis present

## 2023-10-24 ENCOUNTER — Telehealth: Payer: Self-pay

## 2023-10-24 DIAGNOSIS — M545 Low back pain, unspecified: Secondary | ICD-10-CM | POA: Insufficient documentation

## 2023-10-24 NOTE — Telephone Encounter (Signed)
Results viewed by patient via Mychart.

## 2023-11-08 ENCOUNTER — Ambulatory Visit (INDEPENDENT_AMBULATORY_CARE_PROVIDER_SITE_OTHER): Payer: 59 | Admitting: Urology

## 2023-11-08 ENCOUNTER — Encounter: Payer: Self-pay | Admitting: Urology

## 2023-11-08 DIAGNOSIS — R350 Frequency of micturition: Secondary | ICD-10-CM

## 2023-11-08 DIAGNOSIS — R82998 Other abnormal findings in urine: Secondary | ICD-10-CM | POA: Diagnosis not present

## 2023-11-08 DIAGNOSIS — R3915 Urgency of urination: Secondary | ICD-10-CM | POA: Diagnosis not present

## 2023-11-08 DIAGNOSIS — N3281 Overactive bladder: Secondary | ICD-10-CM

## 2023-11-08 DIAGNOSIS — N390 Urinary tract infection, site not specified: Secondary | ICD-10-CM

## 2023-11-08 LAB — URINALYSIS, ROUTINE W REFLEX MICROSCOPIC
Bilirubin, UA: NEGATIVE
Ketones, UA: NEGATIVE
Leukocytes,UA: NEGATIVE
Nitrite, UA: POSITIVE — AB
Protein,UA: NEGATIVE
Specific Gravity, UA: 1.015 (ref 1.005–1.030)
Urobilinogen, Ur: 0.2 mg/dL (ref 0.2–1.0)
pH, UA: 6 (ref 5.0–7.5)

## 2023-11-08 LAB — MICROSCOPIC EXAMINATION: WBC, UA: >30 /HPF — AB (ref 0–5)

## 2023-11-08 MED ORDER — LIDOCAINE HCL 1 % IJ SOLN: 20.0000 mL | Freq: Once | INTRAMUSCULAR | Status: DC

## 2023-11-08 MED ORDER — CIPROFLOXACIN HCL 500 MG PO TABS: 500.0000 mg | ORAL_TABLET | Freq: Once | ORAL | Status: DC

## 2023-11-08 MED ORDER — SULFAMETHOXAZOLE-TRIMETHOPRIM 800-160 MG PO TABS: 1.0000 | ORAL_TABLET | Freq: Two times a day (BID) | ORAL | 0 refills | Status: DC

## 2023-11-08 MED ORDER — ONABOTULINUMTOXINA 100 UNITS IJ SOLR: 100.0000 [IU] | Freq: Once | INTRAMUSCULAR | Status: DC

## 2023-11-08 NOTE — Patient Instructions (Signed)
 Asymptomatic Bacteriuria Asymptomatic bacteriuria is when you have a lot of germs called bacteria in your pee (urine), but they don't cause symptoms.  You may not need treatment for this condition. But you may be more likely to get an infection in the future. What are the causes? You may get more germs in your pee because of: Germs going into your urinary tract. Your urinary tract includes your kidneys, ureters, bladder, and urethra. Germs can get into your urinary tract during sex. A blockage in your urinary tract. This may be from a kidney stone or from an abnormal growth of cells called a tumor. Bladder problems that stop the bladder from emptying. What increases the risk? You're more likely to get this condition if: You have diabetes. You're an older adult. You're even more likely to get it if you live in a long-term care facility. You're female. You're pregnant. You have kidney stones. You've had a kidney transplant. You have a soft tube called a catheter that drains your pee. What are the signs or symptoms? There are no symptoms. How is this diagnosed? This condition is diagnosed with a pee test. It may be found when a sample of pee is taken to treat another condition, such as a problem with your kidney. If you're in your first trimester of pregnancy, you may be screened for this condition. How is this treated? In most cases, treatment isn't needed. Treating the condition can lead to other problems, such as: A yeast infection. The growth of antibiotic-resistant bacteria. These are germs that don't respond to treatment. But some people need to take antibiotics to prevent a kidney infection. You may need treatment if: You're having a procedure that affects your urinary tract. You've had a kidney transplant. You're pregnant. A kidney infection during pregnancy can lead to: Early labor. Very low birth weight. Newborn death. Follow these instructions at home: Medicines Take your  medicines only as told. Take your antibiotics as told. Do not stop taking them even if you start to feel better. General instructions Closely watch your condition for any changes. Drink enough fluid to keep your pee pale yellow. Pee more often to keep your bladder empty. If you're female, keep the area around your vagina and butt clean. Wipe from front to back after you pee or poop. Use each tissue only once when you wipe. Contact a health care provider if: You have symptoms of an infection. These symptoms may include: A burning feeling, or pain when you pee. A strong need to pee. Peeing more often. Pee that turns discolored or cloudy. Blood in your pee. Pee that smells bad or odd. A fever or chills. You have very bad pain in your back or lower belly. You faint. This information is not intended to replace advice given to you by your health care provider. Make sure you discuss any questions you have with your health care provider. Document Revised: 11/01/2022 Document Reviewed: 11/01/2022 Elsevier Patient Education  2024 [employer].

## 2023-11-08 NOTE — Progress Notes (Signed)
 11/08/2023 9:20 AM   [name] [birth year] [id number]  Referring provider: Lenn Quint, NP [address] [place],  [place] [address]  Followup OAB   HPI: [name] is a 62yo here for followup for OAB. She was scheduled for intravesical botox  today but UA is concerning for infection. She denies nay dysuria but does have increased urinary frequency and urgency. She noted significant improvement in her OAb symptoms after intravesical botox  but now her OAB symptoms have worsened   PMH: Past Medical History:  Diagnosis Date   Anxiety    Arthritis    Asthma    Cancer (HCC)    cervical cancer; pt states a long long time ago   CHF (congestive heart failure) (HCC)    Coronary artery disease    Depression    Diabetes mellitus without complication (HCC)    Dyspnea    Edema of all four extremities    Essential (primary) hypertension 06/24/2020   Hyperlipemia    Type II diabetes mellitus (HCC) 03/22/2022    Surgical History: Past Surgical History:  Procedure Laterality Date   ABDOMINAL HYSTERECTOMY     ANTERIOR LAT LUMBAR FUSION N/A 11/05/2019   Procedure: Lumbar Four-Five Anterolateral lumbar interbody fusion with lateral plate;  Surgeon: Elna Haggis, MD;  [facility];  Service: Neurosurgery;  Laterality: N/A;  anterolateral   BACK SURGERY     pins   BACK SURGERY  07/11/2008   BIOPSY  06/29/2021   Procedure: BIOPSY;  Surgeon: Urban Garden, MD;  [facility];  Service: Gastroenterology;;   BLADDER REPAIR     CARPAL TUNNEL RELEASE Right    CESAREAN SECTION  1982, 1984   COLONOSCOPY N/A 06/12/2013   rehman: normal   COLONOSCOPY WITH PROPOFOL  N/A 06/27/2023   Procedure: COLONOSCOPY WITH PROPOFOL ;  Surgeon: Hargis Lias, MD;  [facility];  Service: Endoscopy;  Laterality: N/A;  2:00PM;ASA 3   CORONARY PRESSURE/FFR STUDY N/A 10/20/2022   Procedure: CORONARY PRESSURE/FFR STUDY;  Surgeon: Kyra Phy, MD;   [facility];  Service: Cardiovascular;  Laterality: N/A;   CYSTOSCOPY WITH INJECTION N/A 03/23/2023   Procedure: CYSTOSCOPY WITH INJECTION-botox ;  Surgeon: Marco Severs, MD;  Location: [facility];  Service: Urology;  Laterality: N/A;   ESOPHAGOGASTRODUODENOSCOPY (EGD) WITH PROPOFOL  N/A 06/29/2021   Procedure: ESOPHAGOGASTRODUODENOSCOPY (EGD) WITH PROPOFOL ;  Surgeon: Urban Garden, MD;  [facility];  Service: Gastroenterology;  Laterality: N/A;  8:40   KNEE ARTHROSCOPY WITH MEDIAL MENISECTOMY Right 05/09/2019   Procedure: KNEE ARTHROSCOPY WITH MEDIAL MENISECTOMY AND LATERAL MENISECTOMY;  Surgeon: Darrin Emerald, MD;  Location: [facility];  Service: Orthopedics;  Laterality: Right;   KNEE ARTHROSCOPY WITH MEDIAL MENISECTOMY Left 06/27/2019   Procedure: KNEE ARTHROSCOPY WITH MEDIAL MENISCECTOMY;  Surgeon: Darrin Emerald, MD;  Location: [facility];  Service: Orthopedics;  Laterality: Left;   LEFT HEART CATH AND CORONARY ANGIOGRAPHY N/A 10/20/2022   Procedure: LEFT HEART CATH AND CORONARY ANGIOGRAPHY;  Surgeon: Kyra Phy, MD;  [facility];  Service: Cardiovascular;  Laterality: N/A;   POLYPECTOMY  06/27/2023   Procedure: POLYPECTOMY;  Surgeon: Hargis Lias, MD;  [facility];  Service: Endoscopy;;   TOTAL KNEE ARTHROPLASTY Left 03/15/2022   Procedure: TOTAL KNEE ARTHROPLASTY;  Surgeon: Darrin Emerald, MD;  [facility];  Service: Orthopedics;  Laterality: Left;   TOTAL KNEE ARTHROPLASTY Right 02/21/2023   Procedure: TOTAL KNEE  ARTHROPLASTY;  Surgeon: Darrin Emerald, MD;  [facility]: [facility];  Service: Orthopedics;  Laterality: Right;    Home Medications:  Allergies as of 11/08/2023   No Known Allergies      Medication List        Accurate as of November 08, 2023  9:20 AM. If you have any questions, ask your nurse or doctor.          albuterol  108 (90 Base) MCG/ACT inhaler Commonly known as: VENTOLIN   HFA Inhale 1-2 puffs into the lungs every 6 (six) hours as needed for wheezing or shortness of breath.   ARIPiprazole  2 MG tablet Commonly known as: ABILIFY  Take 2 mg by mouth daily.   aspirin  81 MG chewable tablet Chew 1 tablet (81 mg total) by mouth 2 (two) times daily.   atorvastatin  80 MG tablet Commonly known as: LIPITOR Take 0.5 tablets (40 mg total) by mouth at bedtime.   celecoxib  100 MG capsule Commonly known as: CELEBREX  Take 100 mg by mouth 2 (two) times daily.   estradiol  0.1 MG/GM vaginal cream Commonly known as: ESTRACE  Discard plastic applicator. Insert a blueberry size amount (approximately 1 gram) of cream on fingertip inside vagina at bedtime 2-3 nights per week. For long term use.   estradiol  2 MG tablet Commonly known as: ESTRACE  TAKE ONE TABLET AT BEDTIME   FLUoxetine  40 MG capsule Commonly known as: PROZAC  Take 40 mg by mouth daily.   gabapentin  300 MG capsule Commonly known as: NEURONTIN  Take 1 capsule (300 mg total) by mouth 3 (three) times daily.   loratadine  10 MG tablet Commonly known as: CLARITIN  Take 10 mg by mouth daily.   nitroGLYCERIN  0.4 MG SL tablet Commonly known as: NITROSTAT  Place 1 tablet (0.4 mg total) under the tongue every 5 (five) minutes as needed for chest pain.   oxyCODONE -acetaminophen  7.5-325 MG tablet Commonly known as: PERCOCET Take 1 tablet by mouth 4 (four) times daily.   potassium chloride  10 MEQ tablet Commonly known as: KLOR-CON  M Take 1 tablet (10 mEq total) by mouth daily.   pseudoephedrine-guaifenesin  60-600 MG 12 hr tablet Commonly known as: MUCINEX  D Take 1 tablet by mouth every 12 (twelve) hours.   spironolactone  25 MG tablet Commonly known as: Aldactone  Take 0.5 tablets (12.5 mg total) by mouth daily.   Synjardy  XR 12.11-998 MG Tb24 Generic drug: Empagliflozin -metFORMIN  HCl ER Take 1 tablet by mouth daily.   torsemide  20 MG tablet Commonly known as: DEMADEX  Take 2 tablets (40 mg total) by  mouth daily.   traMADol  50 MG tablet Commonly known as: Ultram  Take 1 tablet (50 mg total) by mouth every 6 (six) hours as needed.   traZODone  100 MG tablet Commonly known as: DESYREL  Take 200 mg by mouth at bedtime.   TYLENOL  500 MG tablet Generic drug: acetaminophen  Take 500 mg by mouth every 6 (six) hours as needed for moderate pain (pain score 4-6).        Allergies: No Known Allergies  Family History: Family History  Problem Relation Age of Onset   Cancer Paternal Grandfather    Cancer Paternal Grandmother    Cancer Maternal Grandmother    Heart disease Father    Lung cancer Mother    Other Brother        gunshot   Colon cancer Other     Social History:  reports that she has quit smoking. Her smoking use included cigarettes. She has a 20 pack-year smoking history. She has never used smokeless tobacco.  She reports that she does not drink alcohol and does not use drugs.  ROS: All other review of systems were reviewed and are negative except what is noted above in HPI  Physical Exam: There were no vitals taken for this visit.  Constitutional:  Alert and oriented, No acute distress. HEENT: Gray AT, moist mucus membranes.  Trachea midline, no masses. Cardiovascular: No clubbing, cyanosis, or edema. Respiratory: Normal respiratory effort, no increased work of breathing. GI: Abdomen is soft, nontender, nondistended, no abdominal masses GU: No CVA tenderness.  Lymph: No cervical or inguinal lymphadenopathy. Skin: No rashes, bruises or suspicious lesions. Neurologic: Grossly intact, no focal deficits, moving all 4 extremities. Psychiatric: Normal mood and affect.  Laboratory Data: Lab Results  Component Value Date   WBC 11.0 (H) 07/17/2023   HGB 10.3 (L) 07/17/2023   HCT 35.3 (L) 07/17/2023   MCV 73.2 (L) 07/17/2023   PLT 290 07/17/2023    Lab Results  Component Value Date   CREATININE 1.07 (H) 09/05/2023    No results found for: "PSA"  No results found  for: "TESTOSTERONE"  Lab Results  Component Value Date   HGBA1C 7.6 (H) 02/14/2023    Urinalysis    Component Value Date/Time   COLORURINE AMBER (A) 01/28/2014 1847   APPEARANCEUR Clear 09/07/2023 0905   LABSPEC 1.022 01/28/2014 1847   PHURINE 6.0 01/28/2014 1847   GLUCOSEU 1+ (A) 09/07/2023 0905   HGBUR LARGE (A) 01/28/2014 1847   BILIRUBINUR Negative 09/07/2023 0905   KETONESUR NEGATIVE 01/28/2014 1847   PROTEINUR Negative 09/07/2023 0905   PROTEINUR 30 (A) 01/28/2014 1847   UROBILINOGEN 0.2 01/28/2014 1847   NITRITE Negative 09/07/2023 0905   NITRITE POSITIVE (A) 01/28/2014 1847   LEUKOCYTESUR 2+ (A) 09/07/2023 0905    Lab Results  Component Value Date   LABMICR See below: 09/07/2023   WBCUA 11-30 (A) 09/07/2023   LABEPIT 0-10 09/07/2023   MUCUS Present 09/02/2021   BACTERIA Few (A) 09/07/2023    Pertinent Imaging:  No results found for this or any previous visit.  No results found for this or any previous visit.  No results found for this or any previous visit.  No results found for this or any previous visit.  No results found for this or any previous visit.  No results found for this or any previous visit.  No results found for this or any previous visit.  No results found for this or any previous visit.   Assessment & Plan:    1. OAB (overactive bladder) (Primary) Reschedule intravesical botox  in office - Urinalysis, Routine w reflex microscopic - Urine Culture  2. Urinary tract infection without hematuria, site unspecified Urine for culture Bactrim  DS BID    No follow-ups on file.  Johnie Nailer, MD  [facility] [place]

## 2023-11-11 LAB — URINE CULTURE

## 2023-11-28 ENCOUNTER — Other Ambulatory Visit: Payer: Self-pay | Admitting: Obstetrics & Gynecology

## 2023-11-28 ENCOUNTER — Other Ambulatory Visit: Payer: Self-pay | Admitting: General Practice

## 2023-11-28 ENCOUNTER — Ambulatory Visit: Attending: Nurse Practitioner

## 2023-11-28 DIAGNOSIS — M5459 Other low back pain: Secondary | ICD-10-CM | POA: Diagnosis present

## 2023-11-28 DIAGNOSIS — M25551 Pain in right hip: Secondary | ICD-10-CM

## 2023-11-28 NOTE — Therapy (Signed)
 OUTPATIENT PHYSICAL THERAPY THORACOLUMBAR EVALUATION   Patient Name: [name] MRN: [id number] DOB:1960/08/27, 63 y.o., female Today's Date: 11/28/2023  END OF SESSION:  PT End of Session - 11/28/23 0849     Visit Number 1    Number of Visits 12    Date for PT Re-Evaluation 02/02/24    PT Start Time 0848    PT Stop Time 0911    PT Time Calculation (min) 23 min    Activity Tolerance Patient limited by pain    Behavior During Therapy [facility] for tasks assessed/performed             Past Medical History:  Diagnosis Date   Anxiety    Arthritis    Asthma    Cancer (HCC)    cervical cancer; pt states a long long time ago   CHF (congestive heart failure) (HCC)    Coronary artery disease    Depression    Diabetes mellitus without complication (HCC)    Dyspnea    Edema of all four extremities    Essential (primary) hypertension 06/24/2020   Hyperlipemia    Type II diabetes mellitus (HCC) 03/22/2022   Past Surgical History:  Procedure Laterality Date   ABDOMINAL HYSTERECTOMY     ANTERIOR LAT LUMBAR FUSION N/A 11/05/2019   Procedure: Lumbar Four-Five Anterolateral lumbar interbody fusion with lateral plate;  Surgeon: Elna Haggis, MD;  [facility];  Service: Neurosurgery;  Laterality: N/A;  anterolateral   BACK SURGERY     pins   BACK SURGERY  07/11/2008   BIOPSY  06/29/2021   Procedure: BIOPSY;  Surgeon: Urban Garden, MD;  [facility];  Service: Gastroenterology;;   BLADDER REPAIR     CARPAL TUNNEL RELEASE Right    CESAREAN SECTION  1982, 1984   COLONOSCOPY N/A 06/12/2013   rehman: normal   COLONOSCOPY WITH PROPOFOL  N/A 06/27/2023   Procedure: COLONOSCOPY WITH PROPOFOL ;  Surgeon: Hargis Lias, MD;  [facility];  Service: Endoscopy;  Laterality: N/A;  2:00PM;ASA 3   CORONARY PRESSURE/FFR STUDY N/A 10/20/2022   Procedure: CORONARY PRESSURE/FFR STUDY;  Surgeon: Kyra Phy, MD;  [facility];   Service: Cardiovascular;  Laterality: N/A;   CYSTOSCOPY WITH INJECTION N/A 03/23/2023   Procedure: CYSTOSCOPY WITH INJECTION-botox ;  Surgeon: Marco Severs, MD;  Location: [facility];  Service: Urology;  Laterality: N/A;   ESOPHAGOGASTRODUODENOSCOPY (EGD) WITH PROPOFOL  N/A 06/29/2021   Procedure: ESOPHAGOGASTRODUODENOSCOPY (EGD) WITH PROPOFOL ;  Surgeon: Urban Garden, MD;  [facility];  Service: Gastroenterology;  Laterality: N/A;  8:40   KNEE ARTHROSCOPY WITH MEDIAL MENISECTOMY Right 05/09/2019   Procedure: KNEE ARTHROSCOPY WITH MEDIAL MENISECTOMY AND LATERAL MENISECTOMY;  Surgeon: Darrin Emerald, MD;  Location: [facility];  Service: Orthopedics;  Laterality: Right;   KNEE ARTHROSCOPY WITH MEDIAL MENISECTOMY Left 06/27/2019   Procedure: KNEE ARTHROSCOPY WITH MEDIAL MENISCECTOMY;  Surgeon: Darrin Emerald, MD;  Location: [facility];  Service: Orthopedics;  Laterality: Left;   LEFT HEART CATH AND CORONARY ANGIOGRAPHY N/A 10/20/2022   Procedure: LEFT HEART CATH AND CORONARY ANGIOGRAPHY;  Surgeon: Kyra Phy, MD;  [facility];  Service: Cardiovascular;  Laterality: N/A;   POLYPECTOMY  06/27/2023   Procedure: POLYPECTOMY;  Surgeon: Hargis Lias, MD;  [facility];  Service: Endoscopy;;   TOTAL KNEE ARTHROPLASTY Left 03/15/2022   Procedure: TOTAL KNEE ARTHROPLASTY;  Surgeon: Darrin Emerald, MD;  Location: [facility];  Service:  Orthopedics;  Laterality: Left;   TOTAL KNEE ARTHROPLASTY Right 02/21/2023   Procedure: TOTAL KNEE ARTHROPLASTY;  Surgeon: Darrin Emerald, MD;  Location: [facility];  Service: Orthopedics;  Laterality: Right;   Patient Active Problem List   Diagnosis Date Noted   Adenomatous polyp of sigmoid colon 06/27/2023   Chronic congestive heart failure (HCC) 06/05/2023   Superficial fungus infection of skin 05/19/2023   Left breast abscess 05/19/2023   OAB (overactive bladder) 03/23/2023   S/P total knee replacement,  right 02/21/23 03/08/2023   Primary osteoarthritis of right knee 02/21/2023   Lesion of vulva 10/06/2022   Lichen sclerosus et atrophicus 10/06/2022   S/P hysterectomy with oophorectomy 10/06/2022   Hot flashes 10/06/2022   Current use of estrogen therapy 10/06/2022   Anxiety 03/22/2022   Asthma 03/22/2022   Bipolar 1 disorder (HCC) 03/22/2022   Chronic pain 03/22/2022   Edema 03/22/2022   Gastro-esophageal reflux 03/22/2022   Hyperlipidemia 03/22/2022   Insomnia 03/22/2022   Menopausal symptom 03/22/2022   Sinusitis 03/22/2022   Type II diabetes mellitus (HCC) 03/22/2022   Osteoarthritis of left knee 03/15/2022   History of operative procedure on lumbosacral spinal structure 02/15/2022   Urge incontinence 09/02/2021   Cervical radiculopathy 12/30/2020   Pseudoarthrosis of lumbar spine 08/25/2020   Essential (primary) hypertension 06/24/2020   Body mass index (BMI) 45.0-49.9, adult (HCC) 05/06/2020   Carpal tunnel syndrome 04/01/2020   Spondylolisthesis, lumbar region 11/22/2019   Localized swelling of both lower legs 11/12/2019   Lumbar stenosis 11/05/2019   S/P left knee arthroscopy 06/27/2019 07/01/2019   Derangement of posterior horn of medial meniscus of left knee    Primary osteoarthritis of left knee    S/P right knee arthroscopy 05/09/19 05/29/2019   Depression 07/24/2014   LOWER LEG, ARTHRITIS, DEGEN./OSTEO 02/18/2009   DERANGEMENT MENISCUS 02/18/2009   ANSERINE BURSITIS, RIGHT 02/18/2009   REFERRING PROVIDER: Michae Aden, NP   REFERRING DIAG: Low back pain, unspecified, Spondylosis without myelopathy or radiculopathy, lumbar region, History of lumbar spinal fusion   Rationale for Evaluation and Treatment: Rehabilitation  THERAPY DIAG:  Other low back pain  Pain in right hip  ONSET DATE: years  SUBJECTIVE:                                                                                                                                                                                            SUBJECTIVE STATEMENT: Patient reports that her back has been bothering her for "a long time." Her pain starts in her back and will travel into her right hip. She has tried injection, but they have not helped  any. Her pain will get so bad that it will cause her to start crying. She feels that a good day is being able to sleep for 4 hours. However, her bad days keep her from sleeping any.   PERTINENT HISTORY:  Hypertension, CHF, asthma, type 2 diabetes, osteoarthritis, depression, anxiety, bipolar 1 disorder, and history of cancer  PAIN:  Are you having pain? Yes: NPRS scale: Current: 8/10 Best: 8/10 Worst: 10/10 Pain location: low back and right hip Pain description: constant, sharp pain, tingling Aggravating factors: bending, twisting Relieving factors: pain medication and laying down  PRECAUTIONS: None  RED FLAGS: None   WEIGHT BEARING RESTRICTIONS: No  FALLS:  Has patient fallen in last 6 months? No, but "I can tell that my balance is off"  LIVING ENVIRONMENT: Lives with: lives with their family Lives in: House/apartment Stairs: Yes: External: 3 steps; on left going up; step to pattern Has following equipment at home: None  OCCUPATION: disabled  PLOF: Independent  PATIENT GOALS: reduced pain  NEXT MD VISIT: none scheduled  OBJECTIVE:  Note: Objective measures were completed at Evaluation unless otherwise noted.  PATIENT SURVEYS:  Modified Oswestry 40% disability   COGNITION: Overall cognitive status: Within functional limits for tasks assessed     SENSATION: Patient reports no altered sensation.   POSTURE: rounded shoulders, forward head, and flexed trunk   PALPATION: TTP: left PSIS, right gluteals, latissimus dorsi, QL, and lumbar paraspinals  JOINT MOBILITY:  T10-L5: hypomobile and painful  LUMBAR ROM:   AROM eval  Flexion 24  Extension 10  Right lateral flexion 50% limited  Left lateral flexion 50%  limited; "pulls a little bit"  Right rotation 50% limited; familiar pain  Left rotation 50% limited   (Blank rows = not tested)  LOWER EXTREMITY ROM: WFL for activities assessed  LOWER EXTREMITY MMT:    MMT Right eval Left eval  Hip flexion 3+/5 3+/5  Hip extension    Hip abduction    Hip adduction    Hip internal rotation    Hip external rotation    Knee flexion 4+/5 4+/5  Knee extension 3/5 4-/5  Ankle dorsiflexion 3+/5 3+/5  Ankle plantarflexion    Ankle inversion    Ankle eversion     (Blank rows = not tested)  GAIT: Assistive device utilized: None Level of assistance: Complete Independence Comments: decreased gait speed and stride length  TREATMENT DATE:                                                                                                                                  PATIENT EDUCATION:  Education details: Plan of care, prognosis, objective findings, healing, and goals for physical therapy Person educated: Patient Education method: Explanation Education comprehension: verbalized understanding  HOME EXERCISE PROGRAM:   ASSESSMENT:  CLINICAL IMPRESSION: Patient is a 63 y.o. female who was seen today for physical therapy evaluation and treatment for chronic low back and  right hip pain.  She presented with high pain severity and irritability with palpation and right lumbar rotation being the most aggravating to her familiar symptoms.  She exhibited significant deficits in lumbar active range of motion and bilateral lower extremity strength.  Recommend that she continue with skilled physical therapy to address her impairments to maximize her functional mobility.  OBJECTIVE IMPAIRMENTS: Abnormal gait, decreased activity tolerance, decreased mobility, difficulty walking, decreased ROM, decreased strength, hypomobility, impaired tone, postural dysfunction, and pain.   ACTIVITY LIMITATIONS: lifting, bending, sleeping, stairs, and locomotion  level  PARTICIPATION LIMITATIONS: cleaning, community activity, and yard work  PERSONAL FACTORS: Past/current experiences, Time since onset of injury/illness/exacerbation, and 3+ comorbidities: Hypertension, CHF, asthma, type 2 diabetes, osteoarthritis, depression, anxiety, bipolar 1 disorder, and history of cancer are also affecting patient's functional outcome.   REHAB POTENTIAL: Fair    CLINICAL DECISION MAKING: Evolving/moderate complexity  EVALUATION COMPLEXITY: Moderate   GOALS: Goals reviewed with patient? Yes  SHORT TERM GOALS: Target date: 12/19/23  Patient will be independent with her initial HEP.  Baseline: Goal status: INITIAL  2.  Patient will be able to complete her daily activities without her familiar pain exceeding 8/10. Baseline:  Goal status: INITIAL  3.  Patient will improve her ODI score to at least 30% disability or less for improved function with her daily activities. Baseline:  Goal status: INITIAL  LONG TERM GOALS: Target date: 01/09/24  Patient will be independent with her advanced HEP.  Baseline:  Goal status: INITIAL  2.  Patient will be able to complete her daily activities without her familiar pain exceeding 6/10. Baseline:  Goal status: INITIAL  3.  Patient will improve her lumbar flexion to at least 35 degrees for improved function picking up items from the floor.  Baseline:  Goal status: INITIAL  4.  Patient will improve her right quadriceps strength to at least 4-/5 for improved function with transfers.  Baseline:  Goal status: INITIAL  PLAN:  PT FREQUENCY: 1-2x/week  PT DURATION: 6 weeks  PLANNED INTERVENTIONS: 97164- PT Re-evaluation, 97750- Physical Performance Testing, 97110-Therapeutic exercises, 97530- Therapeutic activity, [id number]- Neuromuscular re-education, 97535- Self Care, [id number]- Manual therapy, [phone]- Gait training, [phone]- Electrical stimulation (unattended), [phone]- Ultrasound, Patient/Family education, Balance training,  Stair training, Dry Needling, Joint mobilization, Spinal mobilization, Cryotherapy, and Moist heat.  PLAN FOR NEXT SESSION: Nustep, isometrics, manual therapy, lower extremity strengthening, and modalities as needed   Lane Pinon, PT 11/28/2023, 1:09 PM

## 2023-12-01 ENCOUNTER — Ambulatory Visit

## 2023-12-01 DIAGNOSIS — M5459 Other low back pain: Secondary | ICD-10-CM | POA: Diagnosis not present

## 2023-12-01 DIAGNOSIS — M25551 Pain in right hip: Secondary | ICD-10-CM

## 2023-12-01 NOTE — Therapy (Signed)
 OUTPATIENT PHYSICAL THERAPY THORACOLUMBAR TREATMENT   Patient Name: [name] MRN: [id number] DOB:1960/10/12, 63 y.o., female Today's Date: 12/01/2023  END OF SESSION:  PT End of Session - 12/01/23 0845     Visit Number 2    Number of Visits 12    Date for PT Re-Evaluation 02/02/24    PT Start Time 0847    PT Stop Time 0930    PT Time Calculation (min) 43 min    Activity Tolerance Patient tolerated treatment well    Behavior During Therapy [facility] for tasks assessed/performed              Past Medical History:  Diagnosis Date   Anxiety    Arthritis    Asthma    Cancer (HCC)    cervical cancer; pt states a long long time ago   CHF (congestive heart failure) (HCC)    Coronary artery disease    Depression    Diabetes mellitus without complication (HCC)    Dyspnea    Edema of all four extremities    Essential (primary) hypertension 06/24/2020   Hyperlipemia    Type II diabetes mellitus (HCC) 03/22/2022   Past Surgical History:  Procedure Laterality Date   ABDOMINAL HYSTERECTOMY     ANTERIOR LAT LUMBAR FUSION N/A 11/05/2019   Procedure: Lumbar Four-Five Anterolateral lumbar interbody fusion with lateral plate;  Surgeon: Elna Haggis, MD;  Location: [facility] OR;  Service: Neurosurgery;  Laterality: N/A;  anterolateral   BACK SURGERY     pins   BACK SURGERY  07/11/2008   BIOPSY  06/29/2021   Procedure: BIOPSY;  Surgeon: Urban Garden, MD;  Location: [facility];  Service: Gastroenterology;;   BLADDER REPAIR     CARPAL TUNNEL RELEASE Right    CESAREAN SECTION  1982, 1984   COLONOSCOPY N/A 06/12/2013   rehman: normal   COLONOSCOPY WITH PROPOFOL  N/A 06/27/2023   Procedure: COLONOSCOPY WITH PROPOFOL ;  Surgeon: Hargis Lias, MD;  Location: [facility];  Service: Endoscopy;  Laterality: N/A;  2:00PM;ASA 3   CORONARY PRESSURE/FFR STUDY N/A 10/20/2022   Procedure: CORONARY PRESSURE/FFR STUDY;  Surgeon: Kyra Phy, MD;  Location: [facility] INV[facility];  Service: Cardiovascular;  Laterality: N/A;   CYSTOSCOPY WITH INJECTION N/A 03/23/2023   Procedure: CYSTOSCOPY WITH INJECTION-botox ;  Surgeon: Marco Severs, MD;  Location: [facility] ORS;  Service: Urology;  Laterality: N/A;   ESOPHAGOGASTRODUODENOSCOPY (EGD) WITH PROPOFOL  N/A 06/29/2021   Procedure: ESOPHAGOGASTRODUODENOSCOPY (EGD) WITH PROPOFOL ;  Surgeon: Urban Garden, MD;  Location: [facility];  Service: Gastroenterology;  Laterality: N/A;  8:40   KNEE ARTHROSCOPY WITH MEDIAL MENISECTOMY Right 05/09/2019   Procedure: KNEE ARTHROSCOPY WITH MEDIAL MENISECTOMY AND LATERAL MENISECTOMY;  Surgeon: Darrin Emerald, MD;  Location: [facility] OR[facility];  Service: Orthopedics;  Laterality: Right;   KNEE ARTHROSCOPY WITH MEDIAL MENISECTOMY Left 06/27/2019   Procedure: KNEE ARTHROSCOPY WITH MEDIAL MENISCECTOMY;  Surgeon: Darrin Emerald, MD;  Location: [facility] ORS;  Service: Orthopedics;  Laterality: Left;   LEFT HEART CATH AND CORONARY ANGIOGRAPHY N/A 10/20/2022   Procedure: LEFT HEART CATH AND CORONARY ANGIOGRAPHY;  Surgeon: Kyra Phy, MD;  Location: [facility] IN[facility];  Service: Cardiovascular;  Laterality: N/A;   POLYPECTOMY  06/27/2023   Procedure: POLYPECTOMY;  Surgeon: Hargis Lias, MD;  [facility];  Service: Endoscopy;;   TOTAL KNEE ARTHROPLASTY Left 03/15/2022   Procedure: TOTAL KNEE ARTHROPLASTY;  Surgeon: Darrin Emerald, MD;  Location: [facility];  Service: Orthopedics;  Laterality: Left;   TOTAL KNEE ARTHROPLASTY Right 02/21/2023   Procedure: TOTAL KNEE ARTHROPLASTY;  Surgeon: Darrin Emerald, MD;  Location: [facility];  Service: Orthopedics;  Laterality: Right;   Patient Active Problem List   Diagnosis Date Noted   Adenomatous polyp of sigmoid colon 06/27/2023   Chronic congestive heart failure (HCC) 06/05/2023   Superficial fungus infection of skin 05/19/2023   Left breast abscess 05/19/2023   OAB (overactive bladder) 03/23/2023   S/P total knee  replacement, right 02/21/23 03/08/2023   Primary osteoarthritis of right knee 02/21/2023   Lesion of vulva 10/06/2022   Lichen sclerosus et atrophicus 10/06/2022   S/P hysterectomy with oophorectomy 10/06/2022   Hot flashes 10/06/2022   Current use of estrogen therapy 10/06/2022   Anxiety 03/22/2022   Asthma 03/22/2022   Bipolar 1 disorder (HCC) 03/22/2022   Chronic pain 03/22/2022   Edema 03/22/2022   Gastro-esophageal reflux 03/22/2022   Hyperlipidemia 03/22/2022   Insomnia 03/22/2022   Menopausal symptom 03/22/2022   Sinusitis 03/22/2022   Type II diabetes mellitus (HCC) 03/22/2022   Osteoarthritis of left knee 03/15/2022   History of operative procedure on lumbosacral spinal structure 02/15/2022   Urge incontinence 09/02/2021   Cervical radiculopathy 12/30/2020   Pseudoarthrosis of lumbar spine 08/25/2020   Essential (primary) hypertension 06/24/2020   Body mass index (BMI) 45.0-49.9, adult (HCC) 05/06/2020   Carpal tunnel syndrome 04/01/2020   Spondylolisthesis, lumbar region 11/22/2019   Localized swelling of both lower legs 11/12/2019   Lumbar stenosis 11/05/2019   S/P left knee arthroscopy 06/27/2019 07/01/2019   Derangement of posterior horn of medial meniscus of left knee    Primary osteoarthritis of left knee    S/P right knee arthroscopy 05/09/19 05/29/2019   Depression 07/24/2014   LOWER LEG, ARTHRITIS, DEGEN./OSTEO 02/18/2009   DERANGEMENT MENISCUS 02/18/2009   ANSERINE BURSITIS, RIGHT 02/18/2009   REFERRING PROVIDER: Michae Aden, NP   REFERRING DIAG: Low back pain, unspecified, Spondylosis without myelopathy or radiculopathy, lumbar region, History of lumbar spinal fusion   Rationale for Evaluation and Treatment: Rehabilitation  THERAPY DIAG:  Other low back pain  Pain in right hip  ONSET DATE: years  SUBJECTIVE:                                                                                                                                                                                            SUBJECTIVE STATEMENT: Patient reports that she is hurting a little today.   PERTINENT HISTORY:  Hypertension, CHF, asthma, type 2 diabetes, osteoarthritis, depression, anxiety, bipolar 1 disorder, and history of cancer  PAIN:  Are  you having pain? Yes: NPRS scale: Current: 8/10 Best: 8/10 Worst: 10/10 Pain location: low back and right hip Pain description: constant, sharp pain, tingling Aggravating factors: bending, twisting Relieving factors: pain medication and laying down  PRECAUTIONS: None  RED FLAGS: None   WEIGHT BEARING RESTRICTIONS: No  FALLS:  Has patient fallen in last 6 months? No, but "I can tell that my balance is off"  LIVING ENVIRONMENT: Lives with: lives with their family Lives in: House/apartment Stairs: Yes: External: 3 steps; on left going up; step to pattern Has following equipment at home: None  OCCUPATION: disabled  PLOF: Independent  PATIENT GOALS: reduced pain  NEXT MD VISIT: none scheduled  OBJECTIVE:  Note: Objective measures were completed at Evaluation unless otherwise noted.  PATIENT SURVEYS:  Modified Oswestry 40% disability   COGNITION: Overall cognitive status: Within functional limits for tasks assessed     SENSATION: Patient reports no altered sensation.   POSTURE: rounded shoulders, forward head, and flexed trunk   PALPATION: TTP: left PSIS, right gluteals, latissimus dorsi, QL, and lumbar paraspinals  JOINT MOBILITY:  T10-L5: hypomobile and painful  LUMBAR ROM:   AROM eval  Flexion 24  Extension 10  Right lateral flexion 50% limited  Left lateral flexion 50% limited; "pulls a little bit"  Right rotation 50% limited; familiar pain  Left rotation 50% limited   (Blank rows = not tested)  LOWER EXTREMITY ROM: WFL for activities assessed  LOWER EXTREMITY MMT:    MMT Right eval Left eval  Hip flexion 3+/5 3+/5  Hip extension    Hip abduction    Hip  adduction    Hip internal rotation    Hip external rotation    Knee flexion 4+/5 4+/5  Knee extension 3/5 4-/5  Ankle dorsiflexion 3+/5 3+/5  Ankle plantarflexion    Ankle inversion    Ankle eversion     (Blank rows = not tested)  GAIT: Assistive device utilized: None Level of assistance: Complete Independence Comments: decreased gait speed and stride length  TREATMENT DATE:                                                                                                                                                                12/01/23  EXERCISE LOG  Exercise Repetitions and Resistance Comments  Nustep  L3 x 16 minutes   Seated hip ADD isometric  3 minutes w/ 5 second hold   Rocker board  4 minutes    Isometric ball press 3 minutes w/ 5 second hold   Standing ball roll out  4 minutes  For lumbar flexion  Side stepping 2.5 minutes   Seated slouch overcorrect  2 minutes  For postural reeducation   Blank cell = exercise not performed today  PATIENT EDUCATION:  Education details: POC and HEP Person educated: Patient Education method: Explanation Education comprehension: verbalized understanding  HOME EXERCISE PROGRAM:   ASSESSMENT:  CLINICAL IMPRESSION: Patient was introduced to multiple new interventions for reduced pain and improved lumbar mobility. She required minimal cueing with today's standing interventions for upright stance to facilitate improved posterior chain engagement. She reported no increase in pain or discomfort with any of today's interventions. She reported feeling better upon the conclusion of treatment. She continues to require skilled physical therapy to address her remaining impairments to return to her prior level of function.   OBJECTIVE IMPAIRMENTS: Abnormal gait, decreased activity tolerance, decreased mobility, difficulty walking, decreased ROM, decreased strength, hypomobility, impaired tone, postural dysfunction, and pain.   ACTIVITY  LIMITATIONS: lifting, bending, sleeping, stairs, and locomotion level  PARTICIPATION LIMITATIONS: cleaning, community activity, and yard work  PERSONAL FACTORS: Past/current experiences, Time since onset of injury/illness/exacerbation, and 3+ comorbidities: Hypertension, CHF, asthma, type 2 diabetes, osteoarthritis, depression, anxiety, bipolar 1 disorder, and history of cancer are also affecting patient's functional outcome.   REHAB POTENTIAL: Fair    CLINICAL DECISION MAKING: Evolving/moderate complexity  EVALUATION COMPLEXITY: Moderate   GOALS: Goals reviewed with patient? Yes  SHORT TERM GOALS: Target date: 12/19/23  Patient will be independent with her initial HEP.  Baseline: Goal status: INITIAL  2.  Patient will be able to complete her daily activities without her familiar pain exceeding 8/10. Baseline:  Goal status: INITIAL  3.  Patient will improve her ODI score to at least 30% disability or less for improved function with her daily activities. Baseline:  Goal status: INITIAL  LONG TERM GOALS: Target date: 01/09/24  Patient will be independent with her advanced HEP.  Baseline:  Goal status: INITIAL  2.  Patient will be able to complete her daily activities without her familiar pain exceeding 6/10. Baseline:  Goal status: INITIAL  3.  Patient will improve her lumbar flexion to at least 35 degrees for improved function picking up items from the floor.  Baseline:  Goal status: INITIAL  4.  Patient will improve her right quadriceps strength to at least 4-/5 for improved function with transfers.  Baseline:  Goal status: INITIAL  PLAN:  PT FREQUENCY: 1-2x/week  PT DURATION: 6 weeks  PLANNED INTERVENTIONS: 97164- PT Re-evaluation, 97750- Physical Performance Testing, 97110-Therapeutic exercises, 97530- Therapeutic activity, [id number]- Neuromuscular re-education, 97535- Self Care, 32355- Manual therapy, [phone]- Gait training, [phone]- Electrical stimulation (unattended),  [phone]- Ultrasound, Patient/Family education, Balance training, Stair training, Dry Needling, Joint mobilization, Spinal mobilization, Cryotherapy, and Moist heat.  PLAN FOR NEXT SESSION: Nustep, isometrics, manual therapy, lower extremity strengthening, and modalities as needed   Lane Pinon, PT 12/01/2023, 11:53 AM

## 2023-12-04 NOTE — Progress Notes (Unsigned)
 Cardiology Office Note:  .   Date:  12/06/2023  ID:  [name], DOB 02/23/1961, MRN [id number] PCP: Lenn Quint, NP  [facility] Providers Cardiologist:  Oneil Bigness, MD    History of Present Illness: .    Chief Complaint  Patient presents with   Follow-up    [name] is a 63 y.o. female with history of HFpEF, non-obstructive CAD, DM, HTN, HLD who presents for follow-up.    History of Present Illness   Susan BATIE "[name]" is a 63 year old female with nonobstructive coronary artery disease and heart failure with preserved ejection fraction who presents with fluid retention.  She experiences persistent fluid retention despite efforts to reduce salt intake. She consumes fast food, which may contribute to her symptoms. She is currently taking torsemide , 40 mg daily, and spironolactone , but requires a refill for her diuretic medication. Financial constraints impact her ability to access fresh produce, affecting her dietary choices. Weights are up 20 lbs.   She has a history of nonobstructive coronary artery disease and heart failure with preserved ejection fraction. Her current medications include aspirin  81 mg daily for coronary artery disease and Synjardy  for diabetes management.  She has a long history of tobacco use, having smoked for 40 years. She has attempted to quit smoking using various methods, including patches and vaping, but has not been successful. She finds it challenging to quit smoking due to the difficulty of breaking a long-term habit.  She engages in daily exercise, including walking, as recommended by her back surgeon, except during inclement weather. She experiences swelling in her legs, which she attributes to fluid retention. She has tried compression stockings but finds them uncomfortable as they cut into her skin.          Problem List CAD -non-obstructive CAD LHC 10/20/2022 -CAC score 800 (99th percentile) -TPV 856 mm3  (calcified 231 mm3; non-calcified plaque 625 mm3) -T chol 127, HDL 44, LDL 61, TG 125 2. HFpEF -LVEDP 32 LHC 10/20/2022 3. DM -A1c 7.6 4. HLD 5. Tobacco abuse     ROS: All other ROS reviewed and negative. Pertinent positives noted in the HPI.     Studies Reviewed: Aaron Aas         LHC 10/20/2022   Mid LAD lesion is 40% stenosed.   1st Mrg lesion is 25% stenosed.   1.  Mild obstructive coronary artery disease with RFR of 1.0 in the LAD and RFR of 0.97 in the left circumflex; RFR of the right coronary artery was not pursued given patient intolerance of adenosine  for coronary microvascular dysfunction assessment (see procedural notes). 2.  Index of microvascular resistance of 13 with normal being less than 25 and coronary flow reserve of 1.9 with normal being greater than 2.0. 3.  Severely elevated LVEDP of 32 mmHg.  TTE 09/15/2023  1. Left ventricular ejection fraction, by estimation, is 55 to 60%. Left  ventricular ejection fraction by 3D volume is 55 %. The left ventricle has  normal function. The left ventricle has no regional wall motion  abnormalities. Left ventricular diastolic   parameters were normal. The average left ventricular global longitudinal  strain is -24.0 %. The global longitudinal strain is normal.   2. Right ventricular systolic function is normal. The right ventricular  size is mildly enlarged. There is normal pulmonary artery systolic  pressure. The estimated right ventricular systolic pressure is 24.2 mmHg.   3. Left atrial size was mildly dilated.  4. The mitral valve is normal in structure. Trivial mitral valve  regurgitation. No evidence of mitral stenosis.   5. The aortic valve is tricuspid. Aortic valve regurgitation is mild.  Aortic valve sclerosis/calcification is present, without any evidence of  aortic stenosis.   6. The inferior vena cava is normal in size with greater than 50%  respiratory variability, suggesting right atrial pressure of 3 mmHg.   Physical Exam:   VS:  BP 114/70 (BP Location: Right Arm, Patient Position: Sitting, Cuff Size: Large)   Pulse 76   Ht 5\' 4"  (1.626 m)   Wt 241 lb (109.3 kg)   SpO2 96%   BMI 41.37 kg/m    Wt Readings from Last 3 Encounters:  12/06/23 241 lb (109.3 kg)  09/05/23 232 lb 6.4 oz (105.4 kg)  08/28/23 242 lb 9.6 oz (110 kg)    GEN: Well nourished, well developed in no acute distress NECK: No JVD; No carotid bruits CARDIAC: RRR, no murmurs, rubs, gallops RESPIRATORY:  Clear to auscultation without rales, wheezing or rhonchi  ABDOMEN: Soft, non-tender, non-distended EXTREMITIES:  2+ pitting edema  ASSESSMENT AND PLAN: .   Assessment and Plan    Heart failure with preserved ejection fraction (HFpEF) Persistent fluid retention with leg swelling and weight gain. Current diuretic regimen may be insufficient due to dietary sodium and medication discrepancies. Financial constraints limit dietary changes. Compression stockings not tolerated. - Prescribed torsemide  40 mg twice daily for 5 days, then 40 mg daily. - Continue spironolactone  12.5 mg daily. - Prescribed potassium 20 mEq daily. - Check BMP today and lipids with direct LDL. - Encouraged reduction of dietary sodium intake. - Advised use of compression stockings when leg swelling decreases.  Nonobstructive coronary artery disease HLD No angina symptoms. Blood pressure controlled. LDL at 61 mg/dL. - Continue aspirin  81 mg daily. - Check lipids with direct LDL today.  Type 2 diabetes mellitus Diabetes management ongoing with Synjardy . A1c is 7.6%.  Tobacco use disorder Long-standing tobacco use with difficulty quitting. Previous cessation attempts unsuccessful. Vaping without nicotine used as harm reduction. - Encouraged cessation of smoking. - Discussed short-term use of vaping as a transition to quitting.              Follow-up: Return in about 6 weeks (around 01/17/2024).   Signed, Gigi Kyle. Rolm Clos, MD, [facility]  [address] [place], [place] [address] [phone]  10:00 AM

## 2023-12-06 ENCOUNTER — Encounter: Payer: Self-pay | Admitting: Cardiovascular Disease

## 2023-12-06 ENCOUNTER — Ambulatory Visit: Payer: 59 | Attending: Cardiovascular Disease | Admitting: Cardiovascular Disease

## 2023-12-06 VITALS — BP 114/70 | HR 76 | Ht 64.0 in | Wt 241.0 lb

## 2023-12-06 DIAGNOSIS — I5032 Chronic diastolic (congestive) heart failure: Secondary | ICD-10-CM

## 2023-12-06 DIAGNOSIS — I10 Essential (primary) hypertension: Secondary | ICD-10-CM | POA: Diagnosis not present

## 2023-12-06 DIAGNOSIS — E7800 Pure hypercholesterolemia, unspecified: Secondary | ICD-10-CM | POA: Diagnosis not present

## 2023-12-06 DIAGNOSIS — I2510 Atherosclerotic heart disease of native coronary artery without angina pectoris: Secondary | ICD-10-CM | POA: Diagnosis not present

## 2023-12-06 MED ORDER — POTASSIUM CHLORIDE CRYS ER 20 MEQ PO TBCR: 20.0000 meq | EXTENDED_RELEASE_TABLET | Freq: Every day | ORAL | 3 refills | Status: AC

## 2023-12-06 NOTE — Patient Instructions (Addendum)
 Medication Instructions:   Increase 40 mg ( 2 tablets)  twice a day for 5 days then return to 40 mg( 2 tablets) daily   Stop Potassium 10 meq   Start 20 meq potassium daily    *If you need a refill on your cardiac medications before your next appointment, please call your pharmacy*   Lab Work: BMP LIPID  LDL direct If you have labs (blood work) drawn today and your tests are completely normal, you will receive your results only by: MyChart Message (if you have MyChart) OR A paper copy in the mail If you have any lab test that is abnormal or we need to change your treatment, we will call you to review the results.   Testing/Procedures: Not needed   Follow-Up: At [facility], you and your health needs are our priority.  As part of our continuing mission to provide you with exceptional heart care, we have created designated Provider Care Teams.  These Care Teams include your primary Cardiologist (physician) and Advanced Practice Providers (APPs -  Physician Assistants and Nurse Practitioners) who all work together to provide you with the care you need, when you need it.     Your next appointment:   4 to 6  week(s)  The format for your next appointment:   In Person  Provider:   Ervin Heath, PA-C      Then, Oneil Bigness, MD will plan to see you again in 6 month(s).

## 2023-12-07 ENCOUNTER — Ambulatory Visit: Payer: Self-pay | Admitting: Cardiovascular Disease

## 2023-12-07 LAB — BASIC METABOLIC PANEL WITH GFR
BUN/Creatinine Ratio: 9 — ABNORMAL LOW (ref 12–28)
BUN: 7 mg/dL — ABNORMAL LOW (ref 8–27)
CO2: 22 mmol/L (ref 20–29)
Calcium: 9.5 mg/dL (ref 8.7–10.3)
Chloride: 103 mmol/L (ref 96–106)
Creatinine, Ser: 0.77 mg/dL (ref 0.57–1.00)
Glucose: 75 mg/dL (ref 70–99)
Potassium: 4.9 mmol/L (ref 3.5–5.2)
Sodium: 141 mmol/L (ref 134–144)
eGFR: 88 mL/min/{1.73_m2} (ref >59)

## 2023-12-07 LAB — LDL CHOLESTEROL, DIRECT: LDL Direct: 64 mg/dL (ref 0–99)

## 2023-12-07 LAB — LIPID PANEL
Chol/HDL Ratio: 2.7 ratio (ref 0.0–4.4)
Cholesterol, Total: 128 mg/dL (ref 100–199)
HDL: 48 mg/dL (ref >39)
LDL Chol Calc (NIH): 64 mg/dL (ref 0–99)
Triglycerides: 81 mg/dL (ref 0–149)
VLDL Cholesterol Cal: 16 mg/dL (ref 5–40)

## 2023-12-08 ENCOUNTER — Ambulatory Visit: Admitting: *Deleted

## 2023-12-08 ENCOUNTER — Encounter: Payer: Self-pay | Admitting: *Deleted

## 2023-12-08 DIAGNOSIS — M5459 Other low back pain: Secondary | ICD-10-CM | POA: Diagnosis not present

## 2023-12-08 NOTE — Therapy (Signed)
 OUTPATIENT PHYSICAL THERAPY THORACOLUMBAR TREATMENT   Patient Name: [name] MRN: [id number] DOB:06-Mar-1961, 63 y.o., female Today's Date: 12/08/2023  END OF SESSION:  PT End of Session - 12/08/23 0755     Visit Number 3    Number of Visits 12    Date for PT Re-Evaluation 02/02/24              Past Medical History:  Diagnosis Date   Anxiety    Arthritis    Asthma    Cancer (HCC)    cervical cancer; pt states a long long time ago   CHF (congestive heart failure) (HCC)    Coronary artery disease    Depression    Diabetes mellitus without complication (HCC)    Dyspnea    Edema of all four extremities    Essential (primary) hypertension 06/24/2020   Hyperlipemia    Type II diabetes mellitus (HCC) 03/22/2022   Past Surgical History:  Procedure Laterality Date   ABDOMINAL HYSTERECTOMY     ANTERIOR LAT LUMBAR FUSION N/A 11/05/2019   Procedure: Lumbar Four-Five Anterolateral lumbar interbody fusion with lateral plate;  Surgeon: Elna Haggis, MD;  Location: [facility];  Service: Neurosurgery;  Laterality: N/A;  anterolateral   BACK SURGERY     pins   BACK SURGERY  07/11/2008   BIOPSY  06/29/2021   Procedure: BIOPSY;  Surgeon: Urban Garden, MD;  Location: [facility];  Service: Gastroenterology;;   BLADDER REPAIR     CARPAL TUNNEL RELEASE Right    CESAREAN SECTION  1982, 1984   COLONOSCOPY N/A 06/12/2013   rehman: normal   COLONOSCOPY WITH PROPOFOL  N/A 06/27/2023   Procedure: COLONOSCOPY WITH PROPOFOL ;  Surgeon: Hargis Lias, MD;  [facility]: [facility];  Service: Endoscopy;  Laterality: N/A;  2:00PM;ASA 3   CORONARY PRESSURE/FFR STUDY N/A 10/20/2022   Procedure: CORONARY PRESSURE/FFR STUDY;  Surgeon: Kyra Phy, MD;  [facility]: [facility];  Service: Cardiovascular;  Laterality: N/A;   CYSTOSCOPY WITH INJECTION N/A 03/23/2023   Procedure: CYSTOSCOPY WITH INJECTION-botox ;  Surgeon: Marco Severs, MD;  Location: [facility];   Service: Urology;  Laterality: N/A;   ESOPHAGOGASTRODUODENOSCOPY (EGD) WITH PROPOFOL  N/A 06/29/2021   Procedure: ESOPHAGOGASTRODUODENOSCOPY (EGD) WITH PROPOFOL ;  Surgeon: Urban Garden, MD;  [facility];  Service: Gastroenterology;  Laterality: N/A;  8:40   KNEE ARTHROSCOPY WITH MEDIAL MENISECTOMY Right 05/09/2019   Procedure: KNEE ARTHROSCOPY WITH MEDIAL MENISECTOMY AND LATERAL MENISECTOMY;  Surgeon: Darrin Emerald, MD;  Location: [facility];  Service: Orthopedics;  Laterality: Right;   KNEE ARTHROSCOPY WITH MEDIAL MENISECTOMY Left 06/27/2019   Procedure: KNEE ARTHROSCOPY WITH MEDIAL MENISCECTOMY;  Surgeon: Darrin Emerald, MD;  Location: [facility];  Service: Orthopedics;  Laterality: Left;   LEFT HEART CATH AND CORONARY ANGIOGRAPHY N/A 10/20/2022   Procedure: LEFT HEART CATH AND CORONARY ANGIOGRAPHY;  Surgeon: Kyra Phy, MD;  [facility];  Service: Cardiovascular;  Laterality: N/A;   POLYPECTOMY  06/27/2023   Procedure: POLYPECTOMY;  Surgeon: Hargis Lias, MD;  [facility];  Service: Endoscopy;;   TOTAL KNEE ARTHROPLASTY Left 03/15/2022   Procedure: TOTAL KNEE ARTHROPLASTY;  Surgeon: Darrin Emerald, MD;  Location: [facility];  Service: Orthopedics;  Laterality: Left;   TOTAL KNEE ARTHROPLASTY Right 02/21/2023   Procedure: TOTAL KNEE ARTHROPLASTY;  Surgeon: Darrin Emerald, MD;  Location: [facility];  Service: Orthopedics;  Laterality: Right;   Patient Active Problem List  Diagnosis Date Noted   Acute left-sided low back pain without sciatica 10/24/2023   Adenomatous polyp of sigmoid colon 06/27/2023   Chronic congestive heart failure (HCC) 06/05/2023   Superficial fungus infection of skin 05/19/2023   Left breast abscess 05/19/2023   OAB (overactive bladder) 03/23/2023   S/P total knee replacement, right 02/21/23 03/08/2023   Primary osteoarthritis of right knee 02/21/2023   Lesion of vulva 10/06/2022   Lichen sclerosus et  atrophicus 10/06/2022   S/P hysterectomy with oophorectomy 10/06/2022   Hot flashes 10/06/2022   Current use of estrogen therapy 10/06/2022   Anxiety 03/22/2022   Asthma 03/22/2022   Bipolar 1 disorder (HCC) 03/22/2022   Chronic pain 03/22/2022   Edema 03/22/2022   Gastro-esophageal reflux 03/22/2022   Hyperlipidemia 03/22/2022   Insomnia 03/22/2022   Menopausal symptom 03/22/2022   Sinusitis 03/22/2022   Type II diabetes mellitus (HCC) 03/22/2022   Osteoarthritis of left knee 03/15/2022   History of operative procedure on lumbosacral spinal structure 02/15/2022   Urge incontinence 09/02/2021   Cervical radiculopathy 12/30/2020   Pseudoarthrosis of lumbar spine 08/25/2020   Essential (primary) hypertension 06/24/2020   Body mass index (BMI) 45.0-49.9, adult (HCC) 05/06/2020   Carpal tunnel syndrome 04/01/2020   Spondylolisthesis, lumbar region 11/22/2019   Localized swelling of both lower legs 11/12/2019   Lumbar stenosis 11/05/2019   S/P left knee arthroscopy 06/27/2019 07/01/2019   Derangement of posterior horn of medial meniscus of left knee    Primary osteoarthritis of left knee    S/P right knee arthroscopy 05/09/19 05/29/2019   Depression 07/24/2014   LOWER LEG, ARTHRITIS, DEGEN./OSTEO 02/18/2009   DERANGEMENT MENISCUS 02/18/2009   ANSERINE BURSITIS, RIGHT 02/18/2009   REFERRING PROVIDER: Michae Aden, NP   REFERRING DIAG: Low back pain, unspecified, Spondylosis without myelopathy or radiculopathy, lumbar region, History of lumbar spinal fusion   Rationale for Evaluation and Treatment: Rehabilitation  THERAPY DIAG:  Other low back pain  ONSET DATE: years  SUBJECTIVE:                                                                                                                                                                                           SUBJECTIVE STATEMENT: Patient reports that she is hurting  today   8/10 today.   PERTINENT HISTORY:   Hypertension, CHF, asthma, type 2 diabetes, osteoarthritis, depression, anxiety, bipolar 1 disorder, and history of cancer  PAIN:  Are you having pain? Yes: NPRS scale: Current: 8/10 Best: 8/10 Worst: 10/10 Pain location: low back and right hip Pain description: constant, sharp pain, tingling Aggravating factors: bending, twisting Relieving factors: pain medication and  laying down  PRECAUTIONS: None  RED FLAGS: None   WEIGHT BEARING RESTRICTIONS: No  FALLS:  Has patient fallen in last 6 months? No, but "I can tell that my balance is off"  LIVING ENVIRONMENT: Lives with: lives with their family Lives in: House/apartment Stairs: Yes: External: 3 steps; on left going up; step to pattern Has following equipment at home: None  OCCUPATION: disabled  PLOF: Independent  PATIENT GOALS: reduced pain  NEXT MD VISIT: none scheduled  OBJECTIVE:  Note: Objective measures were completed at Evaluation unless otherwise noted.  PATIENT SURVEYS:  Modified Oswestry 40% disability   COGNITION: Overall cognitive status: Within functional limits for tasks assessed     SENSATION: Patient reports no altered sensation.   POSTURE: rounded shoulders, forward head, and flexed trunk   PALPATION: TTP: left PSIS, right gluteals, latissimus dorsi, QL, and lumbar paraspinals  JOINT MOBILITY:  T10-L5: hypomobile and painful  LUMBAR ROM:   AROM eval  Flexion 24  Extension 10  Right lateral flexion 50% limited  Left lateral flexion 50% limited; "pulls a little bit"  Right rotation 50% limited; familiar pain  Left rotation 50% limited   (Blank rows = not tested)  LOWER EXTREMITY ROM: WFL for activities assessed  LOWER EXTREMITY MMT:    MMT Right eval Left eval  Hip flexion 3+/5 3+/5  Hip extension    Hip abduction    Hip adduction    Hip internal rotation    Hip external rotation    Knee flexion 4+/5 4+/5  Knee extension 3/5 4-/5  Ankle dorsiflexion 3+/5 3+/5  Ankle  plantarflexion    Ankle inversion    Ankle eversion     (Blank rows = not tested)  GAIT: Assistive device utilized: None Level of assistance: Complete Independence Comments: decreased gait speed and stride length  TREATMENT DATE:                                                                                                                                                                12/08/23  EXERCISE LOG  Exercise Repetitions and Resistance Comments  Nustep  L3 x 16 minutes   Seated hip ADD isometric  3 minutes w/ 5 second hold   Rocker board     Isometric ball press 3 minutes w/ 5 second hold HEP on table  Standing ball roll out   For lumbar flexion  Side stepping    Seated slouch overcorrect  2 minutes  For postural reeducation  Seated knee lift with AB bracing 3x6 each side hold 5 secs   AB bracing X 10 hold 5 secs    Blank cell = exercise not performed today  Reiwed HEP  and walking program as well as AB bracing with taransitional movements to decrease  pain triggers with ADL's as well as postures and movement patterns    PATIENT EDUCATION:  Education details: POC and HEP Person educated: Patient Education method: Explanation Education comprehension: verbalized understanding  HOME EXERCISE PROGRAM:   ASSESSMENT:  CLINICAL IMPRESSION: Patient  arrived today 8/10 LBP. Rx focused on core activation and stabilization to decrease pain triggers with ADL's. Postures and movement patterns also discussed and reviewed with AB bracing.Handout given for seated core activation exs in neutral pelvic position.    OBJECTIVE IMPAIRMENTS: Abnormal gait, decreased activity tolerance, decreased mobility, difficulty walking, decreased ROM, decreased strength, hypomobility, impaired tone, postural dysfunction, and pain.   ACTIVITY LIMITATIONS: lifting, bending, sleeping, stairs, and locomotion level  PARTICIPATION LIMITATIONS: cleaning, community activity, and yard work  PERSONAL  FACTORS: Past/current experiences, Time since onset of injury/illness/exacerbation, and 3+ comorbidities: Hypertension, CHF, asthma, type 2 diabetes, osteoarthritis, depression, anxiety, bipolar 1 disorder, and history of cancer are also affecting patient's functional outcome.   REHAB POTENTIAL: Fair    CLINICAL DECISION MAKING: Evolving/moderate complexity  EVALUATION COMPLEXITY: Moderate   GOALS: Goals reviewed with patient? Yes  SHORT TERM GOALS: Target date: 12/19/23  Patient will be independent with her initial HEP.  Baseline: Goal status: INITIAL  2.  Patient will be able to complete her daily activities without her familiar pain exceeding 8/10. Baseline:  Goal status: INITIAL  3.  Patient will improve her ODI score to at least 30% disability or less for improved function with her daily activities. Baseline:  Goal status: INITIAL  LONG TERM GOALS: Target date: 01/09/24  Patient will be independent with her advanced HEP.  Baseline:  Goal status: INITIAL  2.  Patient will be able to complete her daily activities without her familiar pain exceeding 6/10. Baseline:  Goal status: INITIAL  3.  Patient will improve her lumbar flexion to at least 35 degrees for improved function picking up items from the floor.  Baseline:  Goal status: INITIAL  4.  Patient will improve her right quadriceps strength to at least 4-/5 for improved function with transfers.  Baseline:  Goal status: INITIAL  PLAN:  PT FREQUENCY: 1-2x/week  PT DURATION: 6 weeks  PLANNED INTERVENTIONS: 97164- PT Re-evaluation, 97750- Physical Performance Testing, 97110-Therapeutic exercises, 97530- Therapeutic activity, [id number]- Neuromuscular re-education, 97535- Self Care, 36644- Manual therapy, [phone]- Gait training, [phone]- Electrical stimulation (unattended), [phone]- Ultrasound, Patient/Family education, Balance training, Stair training, Dry Needling, Joint mobilization, Spinal mobilization, Cryotherapy, and Moist  heat.  PLAN FOR NEXT SESSION: Nustep, isometrics, manual therapy, lower extremity strengthening, and modalities as needed   Shivaay Stormont,CHRIS, PTA 12/08/2023, 9:01 AM

## 2023-12-11 ENCOUNTER — Ambulatory Visit: Attending: Nurse Practitioner | Admitting: Physical Therapy

## 2023-12-11 ENCOUNTER — Ambulatory Visit: Payer: 59 | Admitting: Orthopedic Surgery

## 2023-12-11 DIAGNOSIS — M5459 Other low back pain: Secondary | ICD-10-CM | POA: Diagnosis present

## 2023-12-11 DIAGNOSIS — M25551 Pain in right hip: Secondary | ICD-10-CM | POA: Diagnosis present

## 2023-12-11 NOTE — Therapy (Signed)
 OUTPATIENT PHYSICAL THERAPY THORACOLUMBAR TREATMENT   Patient Name: [name] MRN: [id number] DOB:08-27-1960, 63 y.o., female Today's Date: 12/11/2023  END OF SESSION:  PT End of Session - 12/11/23 0807     Visit Number 4    Number of Visits 12    Date for PT Re-Evaluation 02/02/24    PT Start Time 0800    PT Stop Time 0852    PT Time Calculation (min) 52 min    Activity Tolerance Patient tolerated treatment well    Behavior During Therapy [facility] for tasks assessed/performed              Past Medical History:  Diagnosis Date   Anxiety    Arthritis    Asthma    Cancer (HCC)    cervical cancer; pt states a long long time ago   CHF (congestive heart failure) (HCC)    Coronary artery disease    Depression    Diabetes mellitus without complication (HCC)    Dyspnea    Edema of all four extremities    Essential (primary) hypertension 06/24/2020   Hyperlipemia    Type II diabetes mellitus (HCC) 03/22/2022   Past Surgical History:  Procedure Laterality Date   ABDOMINAL HYSTERECTOMY     ANTERIOR LAT LUMBAR FUSION N/A 11/05/2019   Procedure: Lumbar Four-Five Anterolateral lumbar interbody fusion with lateral plate;  Surgeon: Elna Haggis, MD;  [facility];  Service: Neurosurgery;  Laterality: N/A;  anterolateral   BACK SURGERY     pins   BACK SURGERY  07/11/2008   BIOPSY  06/29/2021   Procedure: BIOPSY;  Surgeon: Urban Garden, MD;  [facility];  Service: Gastroenterology;;   BLADDER REPAIR     CARPAL TUNNEL RELEASE Right    CESAREAN SECTION  1982, 1984   COLONOSCOPY N/A 06/12/2013   rehman: normal   COLONOSCOPY WITH PROPOFOL  N/A 06/27/2023   Procedure: COLONOSCOPY WITH PROPOFOL ;  Surgeon: Hargis Lias, MD;  [facility];  Service: Endoscopy;  Laterality: N/A;  2:00PM;ASA 3   CORONARY PRESSURE/FFR STUDY N/A 10/20/2022   Procedure: CORONARY PRESSURE/FFR STUDY;  Surgeon: Kyra Phy, MD;  [facility];  Service: Cardiovascular;  Laterality: N/A;   CYSTOSCOPY WITH INJECTION N/A 03/23/2023   Procedure: CYSTOSCOPY WITH INJECTION-botox ;  Surgeon: Marco Severs, MD;  Location: [facility];  Service: Urology;  Laterality: N/A;   ESOPHAGOGASTRODUODENOSCOPY (EGD) WITH PROPOFOL  N/A 06/29/2021   Procedure: ESOPHAGOGASTRODUODENOSCOPY (EGD) WITH PROPOFOL ;  Surgeon: Urban Garden, MD;  [facility];  Service: Gastroenterology;  Laterality: N/A;  8:40   KNEE ARTHROSCOPY WITH MEDIAL MENISECTOMY Right 05/09/2019   Procedure: KNEE ARTHROSCOPY WITH MEDIAL MENISECTOMY AND LATERAL MENISECTOMY;  Surgeon: Darrin Emerald, MD;  Location: [facility];  Service: Orthopedics;  Laterality: Right;   KNEE ARTHROSCOPY WITH MEDIAL MENISECTOMY Left 06/27/2019   Procedure: KNEE ARTHROSCOPY WITH MEDIAL MENISCECTOMY;  Surgeon: Darrin Emerald, MD;  Location: [facility];  Service: Orthopedics;  Laterality: Left;   LEFT HEART CATH AND CORONARY ANGIOGRAPHY N/A 10/20/2022   Procedure: LEFT HEART CATH AND CORONARY ANGIOGRAPHY;  Surgeon: Kyra Phy, MD;  [facility];  Service: Cardiovascular;  Laterality: N/A;   POLYPECTOMY  06/27/2023   Procedure: POLYPECTOMY;  Surgeon: Hargis Lias, MD;  [facility];  Service: Endoscopy;;   TOTAL KNEE ARTHROPLASTY Left 03/15/2022   Procedure: TOTAL KNEE ARTHROPLASTY;  Surgeon: Darrin Emerald, MD;  Location: [facility];  Service: Orthopedics;  Laterality: Left;   TOTAL KNEE ARTHROPLASTY Right 02/21/2023   Procedure: TOTAL KNEE ARTHROPLASTY;  Surgeon: Darrin Emerald, MD;  Location: [facility];  Service: Orthopedics;  Laterality: Right;   Patient Active Problem List   Diagnosis Date Noted   Acute left-sided low back pain without sciatica 10/24/2023   Adenomatous polyp of sigmoid colon 06/27/2023   Chronic congestive heart failure (HCC) 06/05/2023   Superficial fungus infection of skin 05/19/2023   Left breast abscess 05/19/2023   OAB  (overactive bladder) 03/23/2023   S/P total knee replacement, right 02/21/23 03/08/2023   Primary osteoarthritis of right knee 02/21/2023   Lesion of vulva 10/06/2022   Lichen sclerosus et atrophicus 10/06/2022   S/P hysterectomy with oophorectomy 10/06/2022   Hot flashes 10/06/2022   Current use of estrogen therapy 10/06/2022   Anxiety 03/22/2022   Asthma 03/22/2022   Bipolar 1 disorder (HCC) 03/22/2022   Chronic pain 03/22/2022   Edema 03/22/2022   Gastro-esophageal reflux 03/22/2022   Hyperlipidemia 03/22/2022   Insomnia 03/22/2022   Menopausal symptom 03/22/2022   Sinusitis 03/22/2022   Type II diabetes mellitus (HCC) 03/22/2022   Osteoarthritis of left knee 03/15/2022   History of operative procedure on lumbosacral spinal structure 02/15/2022   Urge incontinence 09/02/2021   Cervical radiculopathy 12/30/2020   Pseudoarthrosis of lumbar spine 08/25/2020   Essential (primary) hypertension 06/24/2020   Body mass index (BMI) 45.0-49.9, adult (HCC) 05/06/2020   Carpal tunnel syndrome 04/01/2020   Spondylolisthesis, lumbar region 11/22/2019   Localized swelling of both lower legs 11/12/2019   Lumbar stenosis 11/05/2019   S/P left knee arthroscopy 06/27/2019 07/01/2019   Derangement of posterior horn of medial meniscus of left knee    Primary osteoarthritis of left knee    S/P right knee arthroscopy 05/09/19 05/29/2019   Depression 07/24/2014   LOWER LEG, ARTHRITIS, DEGEN./OSTEO 02/18/2009   DERANGEMENT MENISCUS 02/18/2009   ANSERINE BURSITIS, RIGHT 02/18/2009   REFERRING PROVIDER: Michae Aden, NP   REFERRING DIAG: Low back pain, unspecified, Spondylosis without myelopathy or radiculopathy, lumbar region, History of lumbar spinal fusion   Rationale for Evaluation and Treatment: Rehabilitation  THERAPY DIAG:  Other low back pain  ONSET DATE: years  SUBJECTIVE:                                                                                                                                                                                            SUBJECTIVE STATEMENT: Pain is an 8/10.  PERTINENT HISTORY:  Hypertension, CHF, asthma, type 2 diabetes, osteoarthritis, depression, anxiety, bipolar 1 disorder, and history of cancer  PAIN:  Are you  having pain? Yes: NPRS scale: Current: 8/10 Best: 8/10 Worst: 10/10 Pain location: low back and right hip Pain description: constant, sharp pain, tingling Aggravating factors: bending, twisting Relieving factors: pain medication and laying down  PRECAUTIONS: None  RED FLAGS: None   WEIGHT BEARING RESTRICTIONS: No  FALLS:  Has patient fallen in last 6 months? No, but "I can tell that my balance is off"  LIVING ENVIRONMENT: Lives with: lives with their family Lives in: House/apartment Stairs: Yes: External: 3 steps; on left going up; step to pattern Has following equipment at home: None  OCCUPATION: disabled  PLOF: Independent  PATIENT GOALS: reduced pain  NEXT MD VISIT: none scheduled  OBJECTIVE:  Note: Objective measures were completed at Evaluation unless otherwise noted.  PATIENT SURVEYS:  Modified Oswestry 40% disability   COGNITION: Overall cognitive status: Within functional limits for tasks assessed     SENSATION: Patient reports no altered sensation.   POSTURE: rounded shoulders, forward head, and flexed trunk   PALPATION: TTP: left PSIS, right gluteals, latissimus dorsi, QL, and lumbar paraspinals  JOINT MOBILITY:  T10-L5: hypomobile and painful  LUMBAR ROM:   AROM eval  Flexion 24  Extension 10  Right lateral flexion 50% limited  Left lateral flexion 50% limited; "pulls a little bit"  Right rotation 50% limited; familiar pain  Left rotation 50% limited   (Blank rows = not tested)  LOWER EXTREMITY ROM: WFL for activities assessed  LOWER EXTREMITY MMT:    MMT Right eval Left eval  Hip flexion 3+/5 3+/5  Hip extension    Hip abduction    Hip adduction     Hip internal rotation    Hip external rotation    Knee flexion 4+/5 4+/5  Knee extension 3/5 4-/5  Ankle dorsiflexion 3+/5 3+/5  Ankle plantarflexion    Ankle inversion    Ankle eversion     (Blank rows = not tested)  GAIT: Assistive device utilized: None Level of assistance: Complete Independence Comments: decreased gait speed and stride length  TREATMENT DATE:  12/11/23:                                       EXERCISE LOG  Nustep level 3 x 15 minutes f/b STW/M x 8 minutes with ischemic release utilized to patient's lumbar musculature, right > left (patient seated with UE's resting on two pillows for comfort f/b HMP and IFC at 80-150 Hz on 40% scan x 20 minutes.  Normal modality response following removal of modality.  12/08/23  EXERCISE LOG  Exercise Repetitions and Resistance Comments  Nustep  L3 x 16 minutes   Seated hip ADD isometric  3 minutes w/ 5 second hold   Rocker board     Isometric ball press 3 minutes w/ 5 second hold HEP on table  Standing ball roll out   For lumbar flexion  Side stepping    Seated slouch overcorrect  2 minutes  For postural reeducation  Seated knee lift with AB bracing 3x6 each side hold 5 secs   AB bracing X 10 hold 5 secs    Blank cell = exercise not performed today  Reiwed HEP  and walking program as well as AB bracing with taransitional movements to decrease pain triggers with ADL's as well as postures and movement patterns    PATIENT EDUCATION:  Education details: POC and HEP Person educated: Patient Education method: Explanation Education comprehension: verbalized understanding  HOME EXERCISE PROGRAM:   ASSESSMENT:  CLINICAL IMPRESSION: Patient tender to palpation over lumbar musculature especially on the right with increased tone in right QL.  Good response to STW/M.  Patient felt better after treatment.   OBJECTIVE IMPAIRMENTS: Abnormal gait, decreased activity tolerance, decreased mobility, difficulty walking, decreased ROM, decreased strength, hypomobility, impaired tone, postural dysfunction, and pain.   ACTIVITY LIMITATIONS: lifting, bending, sleeping, stairs, and locomotion level  PARTICIPATION LIMITATIONS: cleaning, community activity, and yard work  PERSONAL FACTORS: Past/current experiences, Time since onset of injury/illness/exacerbation, and 3+ comorbidities: Hypertension, CHF, asthma, type 2 diabetes, osteoarthritis, depression, anxiety, bipolar 1 disorder, and history of cancer are also affecting patient's functional outcome.   REHAB POTENTIAL: Fair    CLINICAL DECISION MAKING: Evolving/moderate complexity  EVALUATION COMPLEXITY: Moderate   GOALS: Goals reviewed with patient? Yes  SHORT TERM GOALS: Target date: 12/19/23  Patient will be independent with her initial HEP.  Baseline: Goal status: INITIAL  2.  Patient will be able to complete her daily activities without her familiar pain exceeding 8/10. Baseline:  Goal status: INITIAL  3.  Patient will improve her ODI score to at least 30% disability or less for improved function with her daily activities. Baseline:  Goal status: INITIAL  LONG TERM GOALS: Target date: 01/09/24  Patient will be independent with her advanced HEP.  Baseline:  Goal status: INITIAL  2.  Patient will be able to complete her daily activities without her familiar pain exceeding 6/10. Baseline:  Goal status: INITIAL  3.  Patient will improve her lumbar flexion to at least 35 degrees for improved function picking up items from the floor.  Baseline:  Goal status: INITIAL  4.  Patient will improve her right quadriceps strength to at least 4-/5 for improved function with transfers.  Baseline:  Goal status: INITIAL  PLAN:  PT FREQUENCY: 1-2x/week  PT DURATION: 6 weeks  PLANNED INTERVENTIONS:  97164- PT Re-evaluation, 97750- Physical Performance Testing, 97110-Therapeutic exercises, 97530- Therapeutic activity, [id number]- Neuromuscular re-education, 97535- Self Care, 44034- Manual therapy, [phone]- Gait training, [phone]- Electrical stimulation (unattended), [phone]- Ultrasound, Patient/Family education, Balance training, Stair training, Dry Needling, Joint mobilization, Spinal mobilization, Cryotherapy, and Moist heat.  PLAN FOR NEXT SESSION: Nustep, isometrics, manual therapy, lower extremity strengthening, and modalities as needed   Jamina Macbeth, [place], PT 12/11/2023, 8:56 AM

## 2023-12-15 ENCOUNTER — Ambulatory Visit

## 2023-12-15 DIAGNOSIS — M5459 Other low back pain: Secondary | ICD-10-CM | POA: Diagnosis not present

## 2023-12-15 NOTE — Therapy (Signed)
 OUTPATIENT PHYSICAL THERAPY THORACOLUMBAR TREATMENT   Patient Name: [name] MRN: [id number] DOB:17-Nov-1960, 63 y.o., female Today's Date: 12/15/2023  END OF SESSION:  PT End of Session - 12/15/23 0803     Visit Number 5    Number of Visits 12    Date for PT Re-Evaluation 02/02/24    PT Start Time 0800    PT Stop Time 0858    PT Time Calculation (min) 58 min    Activity Tolerance Patient tolerated treatment well    Behavior During Therapy [facility] for tasks assessed/performed              Past Medical History:  Diagnosis Date   Anxiety    Arthritis    Asthma    Cancer (HCC)    cervical cancer; pt states a long long time ago   CHF (congestive heart failure) (HCC)    Coronary artery disease    Depression    Diabetes mellitus without complication (HCC)    Dyspnea    Edema of all four extremities    Essential (primary) hypertension 06/24/2020   Hyperlipemia    Type II diabetes mellitus (HCC) 03/22/2022   Past Surgical History:  Procedure Laterality Date   ABDOMINAL HYSTERECTOMY     ANTERIOR LAT LUMBAR FUSION N/A 11/05/2019   Procedure: Lumbar Four-Five Anterolateral lumbar interbody fusion with lateral plate;  Surgeon: Elna Haggis, MD;  Location: [facility] OR;  Service: Neurosurgery;  Laterality: N/A;  anterolateral   BACK SURGERY     pins   BACK SURGERY  07/11/2008   BIOPSY  06/29/2021   Procedure: BIOPSY;  Surgeon: Urban Garden, MD;  Location: [facility];  Service: Gastroenterology;;   BLADDER REPAIR     CARPAL TUNNEL RELEASE Right    CESAREAN SECTION  1982, 1984   COLONOSCOPY N/A 06/12/2013   rehman: normal   COLONOSCOPY WITH PROPOFOL  N/A 06/27/2023   Procedure: COLONOSCOPY WITH PROPOFOL ;  Surgeon: Hargis Lias, MD;  Location: [facility];  Service: Endoscopy;  Laterality: N/A;  2:00PM;ASA 3   CORONARY PRESSURE/FFR STUDY N/A 10/20/2022   Procedure: CORONARY PRESSURE/FFR STUDY;  Surgeon: Kyra Phy, MD;  Location: [facility];  Service: Cardiovascular;  Laterality: N/A;   CYSTOSCOPY WITH INJECTION N/A 03/23/2023   Procedure: CYSTOSCOPY WITH INJECTION-botox ;  Surgeon: Marco Severs, MD;  Location: [facility] ORS;  Service: Urology;  Laterality: N/A;   ESOPHAGOGASTRODUODENOSCOPY (EGD) WITH PROPOFOL  N/A 06/29/2021   Procedure: ESOPHAGOGASTRODUODENOSCOPY (EGD) WITH PROPOFOL ;  Surgeon: Urban Garden, MD;  Location: [facility];  Service: Gastroenterology;  Laterality: N/A;  8:40   KNEE ARTHROSCOPY WITH MEDIAL MENISECTOMY Right 05/09/2019   Procedure: KNEE ARTHROSCOPY WITH MEDIAL MENISECTOMY AND LATERAL MENISECTOMY;  Surgeon: Darrin Emerald, MD;  Location: [facility] OR[facility];  Service: Orthopedics;  Laterality: Right;   KNEE ARTHROSCOPY WITH MEDIAL MENISECTOMY Left 06/27/2019   Procedure: KNEE ARTHROSCOPY WITH MEDIAL MENISCECTOMY;  Surgeon: Darrin Emerald, MD;  Location: [facility] ORS;  Service: Orthopedics;  Laterality: Left;   LEFT HEART CATH AND CORONARY ANGIOGRAPHY N/A 10/20/2022   Procedure: LEFT HEART CATH AND CORONARY ANGIOGRAPHY;  Surgeon: Kyra Phy, MD;  Location: [facility];  Service: Cardiovascular;  Laterality: N/A;   POLYPECTOMY  06/27/2023   Procedure: POLYPECTOMY;  Surgeon: Hargis Lias, MD;  Location: [facility];  Service: Endoscopy;;   TOTAL KNEE ARTHROPLASTY Left 03/15/2022   Procedure: TOTAL KNEE ARTHROPLASTY;  Surgeon: Darrin Emerald, MD;  Location: [facility];  Service: Orthopedics;  Laterality: Left;   TOTAL KNEE ARTHROPLASTY Right 02/21/2023   Procedure: TOTAL KNEE ARTHROPLASTY;  Surgeon: Darrin Emerald, MD;  Location: [facility];  Service: Orthopedics;  Laterality: Right;   Patient Active Problem List   Diagnosis Date Noted   Acute left-sided low back pain without sciatica 10/24/2023   Adenomatous polyp of sigmoid colon 06/27/2023   Chronic congestive heart failure (HCC) 06/05/2023   Superficial fungus infection of skin 05/19/2023   Left breast abscess 05/19/2023   OAB  (overactive bladder) 03/23/2023   S/P total knee replacement, right 02/21/23 03/08/2023   Primary osteoarthritis of right knee 02/21/2023   Lesion of vulva 10/06/2022   Lichen sclerosus et atrophicus 10/06/2022   S/P hysterectomy with oophorectomy 10/06/2022   Hot flashes 10/06/2022   Current use of estrogen therapy 10/06/2022   Anxiety 03/22/2022   Asthma 03/22/2022   Bipolar 1 disorder (HCC) 03/22/2022   Chronic pain 03/22/2022   Edema 03/22/2022   Gastro-esophageal reflux 03/22/2022   Hyperlipidemia 03/22/2022   Insomnia 03/22/2022   Menopausal symptom 03/22/2022   Sinusitis 03/22/2022   Type II diabetes mellitus (HCC) 03/22/2022   Osteoarthritis of left knee 03/15/2022   History of operative procedure on lumbosacral spinal structure 02/15/2022   Urge incontinence 09/02/2021   Cervical radiculopathy 12/30/2020   Pseudoarthrosis of lumbar spine 08/25/2020   Essential (primary) hypertension 06/24/2020   Body mass index (BMI) 45.0-49.9, adult (HCC) 05/06/2020   Carpal tunnel syndrome 04/01/2020   Spondylolisthesis, lumbar region 11/22/2019   Localized swelling of both lower legs 11/12/2019   Lumbar stenosis 11/05/2019   S/P left knee arthroscopy 06/27/2019 07/01/2019   Derangement of posterior horn of medial meniscus of left knee    Primary osteoarthritis of left knee    S/P right knee arthroscopy 05/09/19 05/29/2019   Depression 07/24/2014   LOWER LEG, ARTHRITIS, DEGEN./OSTEO 02/18/2009   DERANGEMENT MENISCUS 02/18/2009   ANSERINE BURSITIS, RIGHT 02/18/2009   REFERRING PROVIDER: Michae Aden, NP   REFERRING DIAG: Low back pain, unspecified, Spondylosis without myelopathy or radiculopathy, lumbar region, History of lumbar spinal fusion   Rationale for Evaluation and Treatment: Rehabilitation  THERAPY DIAG:  Other low back pain  ONSET DATE: years  SUBJECTIVE:                                                                                                                                                                                            SUBJECTIVE STATEMENT: Pain is an 7.5/10.  PERTINENT HISTORY:  Hypertension, CHF, asthma, type 2 diabetes, osteoarthritis, depression, anxiety, bipolar 1 disorder, and history of cancer  PAIN:  Are you  having pain? Yes: NPRS scale: Current: 7.5/10 Best: 8/10 Worst: 10/10 Pain location: low back and right hip Pain description: constant, sharp pain, tingling Aggravating factors: bending, twisting Relieving factors: pain medication and laying down  PRECAUTIONS: None  RED FLAGS: None   WEIGHT BEARING RESTRICTIONS: No  FALLS:  Has patient fallen in last 6 months? No, but "I can tell that my balance is off"  LIVING ENVIRONMENT: Lives with: lives with their family Lives in: House/apartment Stairs: Yes: External: 3 steps; on left going up; step to pattern Has following equipment at home: None  OCCUPATION: disabled  PLOF: Independent  PATIENT GOALS: reduced pain  NEXT MD VISIT: none scheduled  OBJECTIVE:  Note: Objective measures were completed at Evaluation unless otherwise noted.  PATIENT SURVEYS:  Modified Oswestry 40% disability   COGNITION: Overall cognitive status: Within functional limits for tasks assessed     SENSATION: Patient reports no altered sensation.   POSTURE: rounded shoulders, forward head, and flexed trunk   PALPATION: TTP: left PSIS, right gluteals, latissimus dorsi, QL, and lumbar paraspinals  JOINT MOBILITY:  T10-L5: hypomobile and painful  LUMBAR ROM:   AROM eval  Flexion 24  Extension 10  Right lateral flexion 50% limited  Left lateral flexion 50% limited; "pulls a little bit"  Right rotation 50% limited; familiar pain  Left rotation 50% limited   (Blank rows = not tested)  LOWER EXTREMITY ROM: WFL for activities assessed  LOWER EXTREMITY MMT:    MMT Right eval Left eval  Hip flexion 3+/5 3+/5  Hip extension    Hip abduction    Hip  adduction    Hip internal rotation    Hip external rotation    Knee flexion 4+/5 4+/5  Knee extension 3/5 4-/5  Ankle dorsiflexion 3+/5 3+/5  Ankle plantarflexion    Ankle inversion    Ankle eversion     (Blank rows = not tested)  GAIT: Assistive device utilized: None Level of assistance: Complete Independence Comments: decreased gait speed and stride length  TREATMENT DATE:  12/15/23:                                     EXERCISE LOG  Nustep level 3 x 20 minutes f/b STW/M with ischemic release utilized to patient's lumbar musculature, right > left (patient seated with UE's resting on two pillows for comfort f/b HMP and IFC at 80-150 Hz on 40% scan   Normal modality response following removal of modality.                                                                                                                                                              12/08/23  EXERCISE LOG  Exercise Repetitions and Resistance Comments  Nustep  L3 x 16 minutes   Seated hip ADD isometric  3 minutes w/ 5 second hold   Rocker board     Isometric ball press 3 minutes w/ 5 second hold HEP on table  Standing ball roll out   For lumbar flexion  Side stepping    Seated slouch overcorrect  2 minutes  For postural reeducation  Seated knee lift with AB bracing 3x6 each side hold 5 secs   AB bracing X 10 hold 5 secs    Blank cell = exercise not performed today  Reiwed HEP  and walking program as well as AB bracing with taransitional movements to decrease pain triggers with ADL's as well as postures and movement patterns    PATIENT EDUCATION:  Education details: POC and HEP Person educated: Patient Education method: Explanation Education comprehension: verbalized understanding  HOME EXERCISE PROGRAM:   ASSESSMENT:  CLINICAL IMPRESSION: Pt arrives for today's treatment session reporting "a little bit" of low back pain, 7.5/10.  Pt able to decrease ODI score to 32%, making good progress  toward her short term goal.  Pt states that she is performing her exercises at home, but is limited by her back pain when performing her ADLs.  Pt reports that pain can increase to 9/10 with vacuuming.  STW/M performed to right lumbar paraspinals to decrease pain and tone.  Normal responses to estim noted upon removal.  Pt reported slight decrease in pain at completion of today's treatment session.    OBJECTIVE IMPAIRMENTS: Abnormal gait, decreased activity tolerance, decreased mobility, difficulty walking, decreased ROM, decreased strength, hypomobility, impaired tone, postural dysfunction, and pain.   ACTIVITY LIMITATIONS: lifting, bending, sleeping, stairs, and locomotion level  PARTICIPATION LIMITATIONS: cleaning, community activity, and yard work  PERSONAL FACTORS: Past/current experiences, Time since onset of injury/illness/exacerbation, and 3+ comorbidities: Hypertension, CHF, asthma, type 2 diabetes, osteoarthritis, depression, anxiety, bipolar 1 disorder, and history of cancer are also affecting patient's functional outcome.   REHAB POTENTIAL: Fair    CLINICAL DECISION MAKING: Evolving/moderate complexity  EVALUATION COMPLEXITY: Moderate   GOALS: Goals reviewed with patient? Yes  SHORT TERM GOALS: Target date: 12/19/23  Patient will be independent with her initial HEP.  Baseline: Goal status: MET  2.  Patient will be able to complete her daily activities without her familiar pain exceeding 8/10. Baseline: 6/6: 9/10 with vacuuming Goal status: IN PROGRESS  3.  Patient will improve her ODI score to at least 30% disability or less for improved function with her daily activities. Baseline: 32% Goal status: IN PROGRESS  LONG TERM GOALS: Target date: 01/09/24  Patient will be independent with her advanced HEP.  Baseline:  Goal status: IN PROGRESS  2.  Patient will be able to complete her daily activities without her familiar pain exceeding 6/10. Baseline:  Goal status: IN  PROGRESS  3.  Patient will improve her lumbar flexion to at least 35 degrees for improved function picking up items from the floor.  Baseline:  Goal status: IN PROGRESS  4.  Patient will improve her right quadriceps strength to at least 4-/5 for improved function with transfers.  Baseline:  Goal status: IN PROGRESS  PLAN:  PT FREQUENCY: 1-2x/week  PT DURATION: 6 weeks  PLANNED INTERVENTIONS: 97164- PT Re-evaluation, 97750- Physical Performance Testing, 97110-Therapeutic exercises, 97530- Therapeutic activity, [id number]- Neuromuscular re-education, 97535- Self Care, 16109- Manual therapy, [phone]- Gait training, [phone]- Electrical stimulation (unattended), [phone]- Ultrasound, Patient/Family education, Balance training, Stair training, Dry Needling, Joint  mobilization, Spinal mobilization, Cryotherapy, and Moist heat.  PLAN FOR NEXT SESSION: Nustep, isometrics, manual therapy, lower extremity strengthening, and modalities as needed   Deryl Flora, PTA 12/15/2023, 8:59 AM

## 2023-12-18 ENCOUNTER — Other Ambulatory Visit: Admitting: Urology

## 2023-12-18 ENCOUNTER — Ambulatory Visit

## 2023-12-18 DIAGNOSIS — M5459 Other low back pain: Secondary | ICD-10-CM

## 2023-12-18 DIAGNOSIS — M25551 Pain in right hip: Secondary | ICD-10-CM

## 2023-12-18 NOTE — Therapy (Signed)
 OUTPATIENT PHYSICAL THERAPY THORACOLUMBAR TREATMENT   Patient Name: [name] MRN: [id number] DOB:May 30, 1961, 63 y.o., female Today's Date: 12/18/2023  END OF SESSION:  PT End of Session - 12/18/23 0802     Visit Number 6    Number of Visits 12    Date for PT Re-Evaluation 02/02/24    PT Start Time 0800    PT Stop Time 0857    PT Time Calculation (min) 57 min    Activity Tolerance Patient tolerated treatment well    Behavior During Therapy [facility] for tasks assessed/performed              Past Medical History:  Diagnosis Date   Anxiety    Arthritis    Asthma    Cancer (HCC)    cervical cancer; pt states a long long time ago   CHF (congestive heart failure) (HCC)    Coronary artery disease    Depression    Diabetes mellitus without complication (HCC)    Dyspnea    Edema of all four extremities    Essential (primary) hypertension 06/24/2020   Hyperlipemia    Type II diabetes mellitus (HCC) 03/22/2022   Past Surgical History:  Procedure Laterality Date   ABDOMINAL HYSTERECTOMY     ANTERIOR LAT LUMBAR FUSION N/A 11/05/2019   Procedure: Lumbar Four-Five Anterolateral lumbar interbody fusion with lateral plate;  Surgeon: Elna Haggis, MD;  Location: [facility];  Service: Neurosurgery;  Laterality: N/A;  anterolateral   BACK SURGERY     pins   BACK SURGERY  07/11/2008   BIOPSY  06/29/2021   Procedure: BIOPSY;  Surgeon: Urban Garden, MD;  [facility]: [facility];  Service: Gastroenterology;;   BLADDER REPAIR     CARPAL TUNNEL RELEASE Right    CESAREAN SECTION  1982, 1984   COLONOSCOPY N/A 06/12/2013   rehman: normal   COLONOSCOPY WITH PROPOFOL  N/A 06/27/2023   Procedure: COLONOSCOPY WITH PROPOFOL ;  Surgeon: Hargis Lias, MD;  [facility]: [facility];  Service: Endoscopy;  Laterality: N/A;  2:00PM;ASA 3   CORONARY PRESSURE/FFR STUDY N/A 10/20/2022   Procedure: CORONARY PRESSURE/FFR STUDY;  Surgeon: Kyra Phy, MD;  [facility];  Service: Cardiovascular;  Laterality: N/A;   CYSTOSCOPY WITH INJECTION N/A 03/23/2023   Procedure: CYSTOSCOPY WITH INJECTION-botox ;  Surgeon: Marco Severs, MD;  Location: [facility] ORS;  Service: Urology;  Laterality: N/A;   ESOPHAGOGASTRODUODENOSCOPY (EGD) WITH PROPOFOL  N/A 06/29/2021   Procedure: ESOPHAGOGASTRODUODENOSCOPY (EGD) WITH PROPOFOL ;  Surgeon: Urban Garden, MD;  [facility];  Service: Gastroenterology;  Laterality: N/A;  8:40   KNEE ARTHROSCOPY WITH MEDIAL MENISECTOMY Right 05/09/2019   Procedure: KNEE ARTHROSCOPY WITH MEDIAL MENISECTOMY AND LATERAL MENISECTOMY;  Surgeon: Darrin Emerald, MD;  Location: [facility];  Service: Orthopedics;  Laterality: Right;   KNEE ARTHROSCOPY WITH MEDIAL MENISECTOMY Left 06/27/2019   Procedure: KNEE ARTHROSCOPY WITH MEDIAL MENISCECTOMY;  Surgeon: Darrin Emerald, MD;  Location: [facility];  Service: Orthopedics;  Laterality: Left;   LEFT HEART CATH AND CORONARY ANGIOGRAPHY N/A 10/20/2022   Procedure: LEFT HEART CATH AND CORONARY ANGIOGRAPHY;  Surgeon: Kyra Phy, MD;  [facility];  Service: Cardiovascular;  Laterality: N/A;   POLYPECTOMY  06/27/2023   Procedure: POLYPECTOMY;  Surgeon: Hargis Lias, MD;  [facility];  Service: Endoscopy;;   TOTAL KNEE ARTHROPLASTY Left 03/15/2022   Procedure: TOTAL KNEE ARTHROPLASTY;  Surgeon: Darrin Emerald, MD;  Location: [facility];  Service: Orthopedics;  Laterality: Left;   TOTAL KNEE ARTHROPLASTY Right 02/21/2023   Procedure: TOTAL KNEE ARTHROPLASTY;  Surgeon: Darrin Emerald, MD;  Location: [facility];  Service: Orthopedics;  Laterality: Right;   Patient Active Problem List   Diagnosis Date Noted   Acute left-sided low back pain without sciatica 10/24/2023   Adenomatous polyp of sigmoid colon 06/27/2023   Chronic congestive heart failure (HCC) 06/05/2023   Superficial fungus infection of skin 05/19/2023   Left breast abscess 05/19/2023   OAB  (overactive bladder) 03/23/2023   S/P total knee replacement, right 02/21/23 03/08/2023   Primary osteoarthritis of right knee 02/21/2023   Lesion of vulva 10/06/2022   Lichen sclerosus et atrophicus 10/06/2022   S/P hysterectomy with oophorectomy 10/06/2022   Hot flashes 10/06/2022   Current use of estrogen therapy 10/06/2022   Anxiety 03/22/2022   Asthma 03/22/2022   Bipolar 1 disorder (HCC) 03/22/2022   Chronic pain 03/22/2022   Edema 03/22/2022   Gastro-esophageal reflux 03/22/2022   Hyperlipidemia 03/22/2022   Insomnia 03/22/2022   Menopausal symptom 03/22/2022   Sinusitis 03/22/2022   Type II diabetes mellitus (HCC) 03/22/2022   Osteoarthritis of left knee 03/15/2022   History of operative procedure on lumbosacral spinal structure 02/15/2022   Urge incontinence 09/02/2021   Cervical radiculopathy 12/30/2020   Pseudoarthrosis of lumbar spine 08/25/2020   Essential (primary) hypertension 06/24/2020   Body mass index (BMI) 45.0-49.9, adult (HCC) 05/06/2020   Carpal tunnel syndrome 04/01/2020   Spondylolisthesis, lumbar region 11/22/2019   Localized swelling of both lower legs 11/12/2019   Lumbar stenosis 11/05/2019   S/P left knee arthroscopy 06/27/2019 07/01/2019   Derangement of posterior horn of medial meniscus of left knee    Primary osteoarthritis of left knee    S/P right knee arthroscopy 05/09/19 05/29/2019   Depression 07/24/2014   LOWER LEG, ARTHRITIS, DEGEN./OSTEO 02/18/2009   DERANGEMENT MENISCUS 02/18/2009   ANSERINE BURSITIS, RIGHT 02/18/2009   REFERRING PROVIDER: Michae Aden, NP   REFERRING DIAG: Low back pain, unspecified, Spondylosis without myelopathy or radiculopathy, lumbar region, History of lumbar spinal fusion   Rationale for Evaluation and Treatment: Rehabilitation  THERAPY DIAG:  Other low back pain  Pain in right hip  ONSET DATE: years  SUBJECTIVE:                                                                                                                                                                                            SUBJECTIVE STATEMENT: Pt reports 8/10 low back pain.  Pt states she has appointment for nerve ablation scheduled for late June.  PERTINENT HISTORY:  Hypertension,  CHF, asthma, type 2 diabetes, osteoarthritis, depression, anxiety, bipolar 1 disorder, and history of cancer  PAIN:  Are you having pain? Yes: NPRS scale: 8/10 Pain location: low back and right hip Pain description: constant, sharp pain, tingling Aggravating factors: bending, twisting Relieving factors: pain medication and laying down  PRECAUTIONS: None  RED FLAGS: None   WEIGHT BEARING RESTRICTIONS: No  FALLS:  Has patient fallen in last 6 months? No, but "I can tell that my balance is off"  LIVING ENVIRONMENT: Lives with: lives with their family Lives in: House/apartment Stairs: Yes: External: 3 steps; on left going up; step to pattern Has following equipment at home: None  OCCUPATION: disabled  PLOF: Independent  PATIENT GOALS: reduced pain  NEXT MD VISIT: none scheduled  OBJECTIVE:  Note: Objective measures were completed at Evaluation unless otherwise noted.  PATIENT SURVEYS:  Modified Oswestry 40% disability   COGNITION: Overall cognitive status: Within functional limits for tasks assessed     SENSATION: Patient reports no altered sensation.   POSTURE: rounded shoulders, forward head, and flexed trunk   PALPATION: TTP: left PSIS, right gluteals, latissimus dorsi, QL, and lumbar paraspinals  JOINT MOBILITY:  T10-L5: hypomobile and painful  LUMBAR ROM:   AROM eval  Flexion 24  Extension 10  Right lateral flexion 50% limited  Left lateral flexion 50% limited; "pulls a little bit"  Right rotation 50% limited; familiar pain  Left rotation 50% limited   (Blank rows = not tested)  LOWER EXTREMITY ROM: WFL for activities assessed  LOWER EXTREMITY MMT:    MMT Right eval Left eval   Hip flexion 3+/5 3+/5  Hip extension    Hip abduction    Hip adduction    Hip internal rotation    Hip external rotation    Knee flexion 4+/5 4+/5  Knee extension 3/5 4-/5  Ankle dorsiflexion 3+/5 3+/5  Ankle plantarflexion    Ankle inversion    Ankle eversion     (Blank rows = not tested)  GAIT: Assistive device utilized: None Level of assistance: Complete Independence Comments: decreased gait speed and stride length  TREATMENT DATE:  12/18/23                                    EXERCISE LOG  Exercise Repetitions and Resistance Comments  Nustep Lvl 3 x 15 mins        Blank cell = exercise not performed today   Manual Therapy Soft Tissue Mobilization: lumbar, STW/M to right lumbar paraspinals and QL to decrease pain and tone, with pt in left side-lying for comfort   Modalities  Date:  Unattended Estim: Lumbar, IFC 80-150 Hz, 15 mins, Pain and Tone Combo: Lumbar, 1.5 w/cm2, 10 mins, Pain and Tone   12/15/23:                                     EXERCISE LOG  Nustep level 3 x 20 minutes f/b STW/M with ischemic release utilized to patient's lumbar musculature, right > left (patient seated with UE's resting on two pillows for comfort f/b HMP and IFC at 80-150 Hz on 40% scan   Normal modality response following removal of modality.  PATIENT EDUCATION:  Education details: POC and HEP Person educated: Patient Education method: Explanation Education comprehension: verbalized understanding  HOME EXERCISE PROGRAM:   ASSESSMENT:  CLINICAL IMPRESSION: Pt arrives for today's treatment session reporting 8/10 low back pain.  Pt reports that her pain has fluctuated up and down over the weekend.  Pt introduced to combo today with good results.  Normal responses to all modalities noted during today's treatment session.  STW/M performed to right lumbar  paraspinals and QL to decrease pain and tone.  Pt reported 5/10 low back pain at completion of today's treatment session.     OBJECTIVE IMPAIRMENTS: Abnormal gait, decreased activity tolerance, decreased mobility, difficulty walking, decreased ROM, decreased strength, hypomobility, impaired tone, postural dysfunction, and pain.   ACTIVITY LIMITATIONS: lifting, bending, sleeping, stairs, and locomotion level  PARTICIPATION LIMITATIONS: cleaning, community activity, and yard work  PERSONAL FACTORS: Past/current experiences, Time since onset of injury/illness/exacerbation, and 3+ comorbidities: Hypertension, CHF, asthma, type 2 diabetes, osteoarthritis, depression, anxiety, bipolar 1 disorder, and history of cancer are also affecting patient's functional outcome.   REHAB POTENTIAL: Fair    CLINICAL DECISION MAKING: Evolving/moderate complexity  EVALUATION COMPLEXITY: Moderate   GOALS: Goals reviewed with patient? Yes  SHORT TERM GOALS: Target date: 12/19/23  Patient will be independent with her initial HEP.  Baseline: Goal status: MET  2.  Patient will be able to complete her daily activities without her familiar pain exceeding 8/10. Baseline: 6/6: 9/10 with vacuuming Goal status: IN PROGRESS  3.  Patient will improve her ODI score to at least 30% disability or less for improved function with her daily activities. Baseline: 32% Goal status: IN PROGRESS  LONG TERM GOALS: Target date: 01/09/24  Patient will be independent with her advanced HEP.  Baseline:  Goal status: IN PROGRESS  2.  Patient will be able to complete her daily activities without her familiar pain exceeding 6/10. Baseline:  Goal status: IN PROGRESS  3.  Patient will improve her lumbar flexion to at least 35 degrees for improved function picking up items from the floor.  Baseline:  Goal status: IN PROGRESS  4.  Patient will improve her right quadriceps strength to at least 4-/5 for improved function with  transfers.  Baseline:  Goal status: IN PROGRESS  PLAN:  PT FREQUENCY: 1-2x/week  PT DURATION: 6 weeks  PLANNED INTERVENTIONS: 97164- PT Re-evaluation, 97750- Physical Performance Testing, 97110-Therapeutic exercises, 97530- Therapeutic activity, [id number]- Neuromuscular re-education, 97535- Self Care, 09811- Manual therapy, [phone]- Gait training, [phone]- Electrical stimulation (unattended), [phone]- Ultrasound, Patient/Family education, Balance training, Stair training, Dry Needling, Joint mobilization, Spinal mobilization, Cryotherapy, and Moist heat.  PLAN FOR NEXT SESSION: Nustep, isometrics, manual therapy, lower extremity strengthening, and modalities as needed   Deryl Flora, PTA 12/18/2023, 9:07 AM

## 2023-12-21 ENCOUNTER — Ambulatory Visit

## 2023-12-21 DIAGNOSIS — M5459 Other low back pain: Secondary | ICD-10-CM

## 2023-12-21 NOTE — Therapy (Signed)
 OUTPATIENT PHYSICAL THERAPY THORACOLUMBAR TREATMENT   Patient Name: [name] MRN: [id number] DOB:10-05-1960, 63 y.o., female Today's Date: 12/21/2023  END OF SESSION:  PT End of Session - 12/21/23 0802     Visit Number 7    Number of Visits 12    Date for PT Re-Evaluation 02/02/24    PT Start Time 0800    PT Stop Time 0858    PT Time Calculation (min) 58 min    Activity Tolerance Patient tolerated treatment well    Behavior During Therapy [facility] for tasks assessed/performed           Past Medical History:  Diagnosis Date   Anxiety    Arthritis    Asthma    Cancer (HCC)    cervical cancer; pt states a long long time ago   CHF (congestive heart failure) (HCC)    Coronary artery disease    Depression    Diabetes mellitus without complication (HCC)    Dyspnea    Edema of all four extremities    Essential (primary) hypertension 06/24/2020   Hyperlipemia    Type II diabetes mellitus (HCC) 03/22/2022   Past Surgical History:  Procedure Laterality Date   ABDOMINAL HYSTERECTOMY     ANTERIOR LAT LUMBAR FUSION N/A 11/05/2019   Procedure: Lumbar Four-Five Anterolateral lumbar interbody fusion with lateral plate;  Surgeon: Elna Haggis, MD;  [facility]: [facility];  Service: Neurosurgery;  Laterality: N/A;  anterolateral   BACK SURGERY     pins   BACK SURGERY  07/11/2008   BIOPSY  06/29/2021   Procedure: BIOPSY;  Surgeon: Urban Garden, MD;  [facility];  Service: Gastroenterology;;   BLADDER REPAIR     CARPAL TUNNEL RELEASE Right    CESAREAN SECTION  1982, 1984   COLONOSCOPY N/A 06/12/2013   rehman: normal   COLONOSCOPY WITH PROPOFOL  N/A 06/27/2023   Procedure: COLONOSCOPY WITH PROPOFOL ;  Surgeon: Hargis Lias, MD;  [facility];  Service: Endoscopy;  Laterality: N/A;  2:00PM;ASA 3   CORONARY PRESSURE/FFR STUDY N/A 10/20/2022   Procedure: CORONARY PRESSURE/FFR STUDY;  Surgeon: Kyra Phy, MD;  [facility];   Service: Cardiovascular;  Laterality: N/A;   CYSTOSCOPY WITH INJECTION N/A 03/23/2023   Procedure: CYSTOSCOPY WITH INJECTION-botox ;  Surgeon: Marco Severs, MD;  Location: [facility];  Service: Urology;  Laterality: N/A;   ESOPHAGOGASTRODUODENOSCOPY (EGD) WITH PROPOFOL  N/A 06/29/2021   Procedure: ESOPHAGOGASTRODUODENOSCOPY (EGD) WITH PROPOFOL ;  Surgeon: Urban Garden, MD;  [facility];  Service: Gastroenterology;  Laterality: N/A;  8:40   KNEE ARTHROSCOPY WITH MEDIAL MENISECTOMY Right 05/09/2019   Procedure: KNEE ARTHROSCOPY WITH MEDIAL MENISECTOMY AND LATERAL MENISECTOMY;  Surgeon: Darrin Emerald, MD;  Location: [facility];  Service: Orthopedics;  Laterality: Right;   KNEE ARTHROSCOPY WITH MEDIAL MENISECTOMY Left 06/27/2019   Procedure: KNEE ARTHROSCOPY WITH MEDIAL MENISCECTOMY;  Surgeon: Darrin Emerald, MD;  Location: [facility];  Service: Orthopedics;  Laterality: Left;   LEFT HEART CATH AND CORONARY ANGIOGRAPHY N/A 10/20/2022   Procedure: LEFT HEART CATH AND CORONARY ANGIOGRAPHY;  Surgeon: Kyra Phy, MD;  [facility];  Service: Cardiovascular;  Laterality: N/A;   POLYPECTOMY  06/27/2023   Procedure: POLYPECTOMY;  Surgeon: Hargis Lias, MD;  [facility];  Service: Endoscopy;;   TOTAL KNEE ARTHROPLASTY Left 03/15/2022   Procedure: TOTAL KNEE ARTHROPLASTY;  Surgeon: Darrin Emerald, MD;  Location: [facility];  Service: Orthopedics;  Laterality: Left;   TOTAL KNEE ARTHROPLASTY Right 02/21/2023   Procedure: TOTAL KNEE ARTHROPLASTY;  Surgeon: Darrin Emerald, MD;  Location: [facility];  Service: Orthopedics;  Laterality: Right;   Patient Active Problem List   Diagnosis Date Noted   Acute left-sided low back pain without sciatica 10/24/2023   Adenomatous polyp of sigmoid colon 06/27/2023   Chronic congestive heart failure (HCC) 06/05/2023   Superficial fungus infection of skin 05/19/2023   Left breast abscess 05/19/2023   OAB  (overactive bladder) 03/23/2023   S/P total knee replacement, right 02/21/23 03/08/2023   Primary osteoarthritis of right knee 02/21/2023   Lesion of vulva 10/06/2022   Lichen sclerosus et atrophicus 10/06/2022   S/P hysterectomy with oophorectomy 10/06/2022   Hot flashes 10/06/2022   Current use of estrogen therapy 10/06/2022   Anxiety 03/22/2022   Asthma 03/22/2022   Bipolar 1 disorder (HCC) 03/22/2022   Chronic pain 03/22/2022   Edema 03/22/2022   Gastro-esophageal reflux 03/22/2022   Hyperlipidemia 03/22/2022   Insomnia 03/22/2022   Menopausal symptom 03/22/2022   Sinusitis 03/22/2022   Type II diabetes mellitus (HCC) 03/22/2022   Osteoarthritis of left knee 03/15/2022   History of operative procedure on lumbosacral spinal structure 02/15/2022   Urge incontinence 09/02/2021   Cervical radiculopathy 12/30/2020   Pseudoarthrosis of lumbar spine 08/25/2020   Essential (primary) hypertension 06/24/2020   Body mass index (BMI) 45.0-49.9, adult (HCC) 05/06/2020   Carpal tunnel syndrome 04/01/2020   Spondylolisthesis, lumbar region 11/22/2019   Localized swelling of both lower legs 11/12/2019   Lumbar stenosis 11/05/2019   S/P left knee arthroscopy 06/27/2019 07/01/2019   Derangement of posterior horn of medial meniscus of left knee    Primary osteoarthritis of left knee    S/P right knee arthroscopy 05/09/19 05/29/2019   Depression 07/24/2014   LOWER LEG, ARTHRITIS, DEGEN./OSTEO 02/18/2009   DERANGEMENT MENISCUS 02/18/2009   ANSERINE BURSITIS, RIGHT 02/18/2009   REFERRING PROVIDER: Michae Aden, NP   REFERRING DIAG: Low back pain, unspecified, Spondylosis without myelopathy or radiculopathy, lumbar region, History of lumbar spinal fusion   Rationale for Evaluation and Treatment: Rehabilitation  THERAPY DIAG:  Other low back pain  ONSET DATE: years  SUBJECTIVE:                                                                                                                                                                                            SUBJECTIVE STATEMENT: Pt reports 7/10 low back pain.    PERTINENT HISTORY:  Hypertension, CHF, asthma, type 2 diabetes, osteoarthritis, depression, anxiety, bipolar 1 disorder, and history of cancer  PAIN:  Are  you having pain? Yes: NPRS scale: 7/10 Pain location: low back and right hip Pain description: constant, sharp pain, tingling Aggravating factors: bending, twisting Relieving factors: pain medication and laying down  PRECAUTIONS: None  RED FLAGS: None   WEIGHT BEARING RESTRICTIONS: No  FALLS:  Has patient fallen in last 6 months? No, but I can tell that my balance is off  LIVING ENVIRONMENT: Lives with: lives with their family Lives in: House/apartment Stairs: Yes: External: 3 steps; on left going up; step to pattern Has following equipment at home: None  OCCUPATION: disabled  PLOF: Independent  PATIENT GOALS: reduced pain  NEXT MD VISIT: none scheduled  OBJECTIVE:  Note: Objective measures were completed at Evaluation unless otherwise noted.  PATIENT SURVEYS:  Modified Oswestry 40% disability   COGNITION: Overall cognitive status: Within functional limits for tasks assessed     SENSATION: Patient reports no altered sensation.   POSTURE: rounded shoulders, forward head, and flexed trunk   PALPATION: TTP: left PSIS, right gluteals, latissimus dorsi, QL, and lumbar paraspinals  JOINT MOBILITY:  T10-L5: hypomobile and painful  LUMBAR ROM:   AROM eval  Flexion 24  Extension 10  Right lateral flexion 50% limited  Left lateral flexion 50% limited; pulls a little bit  Right rotation 50% limited; familiar pain  Left rotation 50% limited   (Blank rows = not tested)  LOWER EXTREMITY ROM: WFL for activities assessed  LOWER EXTREMITY MMT:    MMT Right eval Left eval  Hip flexion 3+/5 3+/5  Hip extension    Hip abduction    Hip adduction    Hip internal  rotation    Hip external rotation    Knee flexion 4+/5 4+/5  Knee extension 3/5 4-/5  Ankle dorsiflexion 3+/5 3+/5  Ankle plantarflexion    Ankle inversion    Ankle eversion     (Blank rows = not tested)  GAIT: Assistive device utilized: None Level of assistance: Complete Independence Comments: decreased gait speed and stride length  TREATMENT DATE:  12/21/23                                    EXERCISE LOG  Exercise Repetitions and Resistance Comments  Nustep Lvl 3 x 15 mins        Blank cell = exercise not performed today   Manual Therapy Soft Tissue Mobilization: lumbar, STW/M to right lumbar paraspinals and QL to decrease pain and tone, with pt in left side-lying for comfort   Modalities  Date:  Unattended Estim: Lumbar, IFC 80-150 Hz, 15 mins, Pain and Tone Combo: Lumbar, 1.5 w/cm2, 12 mins, Pain and Tone   12/15/23:                                     EXERCISE LOG  Nustep level 3 x 20 minutes f/b STW/M with ischemic release utilized to patient's lumbar musculature, right > left (patient seated with UE's resting on two pillows for comfort f/b HMP and IFC at 80-150 Hz on 40% scan   Normal modality response following removal of modality.  PATIENT EDUCATION:  Education details: POC and HEP Person educated: Patient Education method: Explanation Education comprehension: verbalized understanding  HOME EXERCISE PROGRAM:   ASSESSMENT:  CLINICAL IMPRESSION: Pt arrives for today's treatment session reporting 7/10 low back pain.  Pt reports that her back felt much better after last treatment session.  Normal responses to all modalities performed today.  STW/M performed to right lumbar paraspinals and QL to decrease pain and tone.  Pt reported decreased pain at completion of today's treatment session.  OBJECTIVE IMPAIRMENTS: Abnormal gait, decreased  activity tolerance, decreased mobility, difficulty walking, decreased ROM, decreased strength, hypomobility, impaired tone, postural dysfunction, and pain.   ACTIVITY LIMITATIONS: lifting, bending, sleeping, stairs, and locomotion level  PARTICIPATION LIMITATIONS: cleaning, community activity, and yard work  PERSONAL FACTORS: Past/current experiences, Time since onset of injury/illness/exacerbation, and 3+ comorbidities: Hypertension, CHF, asthma, type 2 diabetes, osteoarthritis, depression, anxiety, bipolar 1 disorder, and history of cancer are also affecting patient's functional outcome.   REHAB POTENTIAL: Fair    CLINICAL DECISION MAKING: Evolving/moderate complexity  EVALUATION COMPLEXITY: Moderate   GOALS: Goals reviewed with patient? Yes  SHORT TERM GOALS: Target date: 12/19/23  Patient will be independent with her initial HEP.  Baseline: Goal status: MET  2.  Patient will be able to complete her daily activities without her familiar pain exceeding 8/10. Baseline: 6/6: 9/10 with vacuuming Goal status: IN PROGRESS  3.  Patient will improve her ODI score to at least 30% disability or less for improved function with her daily activities. Baseline: 32% Goal status: IN PROGRESS  LONG TERM GOALS: Target date: 01/09/24  Patient will be independent with her advanced HEP.  Baseline:  Goal status: IN PROGRESS  2.  Patient will be able to complete her daily activities without her familiar pain exceeding 6/10. Baseline:  Goal status: IN PROGRESS  3.  Patient will improve her lumbar flexion to at least 35 degrees for improved function picking up items from the floor.  Baseline:  Goal status: IN PROGRESS  4.  Patient will improve her right quadriceps strength to at least 4-/5 for improved function with transfers.  Baseline:  Goal status: IN PROGRESS  PLAN:  PT FREQUENCY: 1-2x/week  PT DURATION: 6 weeks  PLANNED INTERVENTIONS: 97164- PT Re-evaluation, 97750- Physical  Performance Testing, 97110-Therapeutic exercises, 97530- Therapeutic activity, [id number]- Neuromuscular re-education, 97535- Self Care, 29562- Manual therapy, [phone]- Gait training, [phone]- Electrical stimulation (unattended), [phone]- Ultrasound, Patient/Family education, Balance training, Stair training, Dry Needling, Joint mobilization, Spinal mobilization, Cryotherapy, and Moist heat.  PLAN FOR NEXT SESSION: Nustep, isometrics, manual therapy, lower extremity strengthening, and modalities as needed   Deryl Flora, PTA 12/21/2023, 8:58 AM

## 2023-12-25 ENCOUNTER — Ambulatory Visit: Admitting: Physical Therapy

## 2023-12-25 DIAGNOSIS — M5459 Other low back pain: Secondary | ICD-10-CM

## 2023-12-25 NOTE — Therapy (Signed)
 OUTPATIENT PHYSICAL THERAPY THORACOLUMBAR TREATMENT   Patient Name: [name] MRN: [id number] DOB:11-Apr-1961, 63 y.o., female Today's Date: 12/25/2023  END OF SESSION:  PT End of Session - 12/25/23 0835     Visit Number 8    Number of Visits 12    Date for PT Re-Evaluation 02/02/24    PT Start Time 0801    PT Stop Time 0857    PT Time Calculation (min) 56 min    Activity Tolerance Patient tolerated treatment well    Behavior During Therapy [facility] for tasks assessed/performed            Past Medical History:  Diagnosis Date   Anxiety    Arthritis    Asthma    Cancer (HCC)    cervical cancer; pt states a long long time ago   CHF (congestive heart failure) (HCC)    Coronary artery disease    Depression    Diabetes mellitus without complication (HCC)    Dyspnea    Edema of all four extremities    Essential (primary) hypertension 06/24/2020   Hyperlipemia    Type II diabetes mellitus (HCC) 03/22/2022   Past Surgical History:  Procedure Laterality Date   ABDOMINAL HYSTERECTOMY     ANTERIOR LAT LUMBAR FUSION N/A 11/05/2019   Procedure: Lumbar Four-Five Anterolateral lumbar interbody fusion with lateral plate;  Surgeon: Elna Haggis, MD;  Location: [facility] OR;  Service: Neurosurgery;  Laterality: N/A;  anterolateral   BACK SURGERY     pins   BACK SURGERY  07/11/2008   BIOPSY  06/29/2021   Procedure: BIOPSY;  Surgeon: Urban Garden, MD;  Location: [facility];  Service: Gastroenterology;;   BLADDER REPAIR     CARPAL TUNNEL RELEASE Right    CESAREAN SECTION  1982, 1984   COLONOSCOPY N/A 06/12/2013   rehman: normal   COLONOSCOPY WITH PROPOFOL  N/A 06/27/2023   Procedure: COLONOSCOPY WITH PROPOFOL ;  Surgeon: Hargis Lias, MD;  Location: [facility];  Service: Endoscopy;  Laterality: N/A;  2:00PM;ASA 3   CORONARY PRESSURE/FFR STUDY N/A 10/20/2022   Procedure: CORONARY PRESSURE/FFR STUDY;  Surgeon: Kyra Phy, MD;  Location: [facility];   Service: Cardiovascular;  Laterality: N/A;   CYSTOSCOPY WITH INJECTION N/A 03/23/2023   Procedure: CYSTOSCOPY WITH INJECTION-botox ;  Surgeon: Marco Severs, MD;  Location: [facility] ORS;  Service: Urology;  Laterality: N/A;   ESOPHAGOGASTRODUODENOSCOPY (EGD) WITH PROPOFOL  N/A 06/29/2021   Procedure: ESOPHAGOGASTRODUODENOSCOPY (EGD) WITH PROPOFOL ;  Surgeon: Urban Garden, MD;  Location: [facility];  Service: Gastroenterology;  Laterality: N/A;  8:40   KNEE ARTHROSCOPY WITH MEDIAL MENISECTOMY Right 05/09/2019   Procedure: KNEE ARTHROSCOPY WITH MEDIAL MENISECTOMY AND LATERAL MENISECTOMY;  Surgeon: Darrin Emerald, MD;  Location: [facility];  Service: Orthopedics;  Laterality: Right;   KNEE ARTHROSCOPY WITH MEDIAL MENISECTOMY Left 06/27/2019   Procedure: KNEE ARTHROSCOPY WITH MEDIAL MENISCECTOMY;  Surgeon: Darrin Emerald, MD;  Location: [facility];  Service: Orthopedics;  Laterality: Left;   LEFT HEART CATH AND CORONARY ANGIOGRAPHY N/A 10/20/2022   Procedure: LEFT HEART CATH AND CORONARY ANGIOGRAPHY;  Surgeon: Kyra Phy, MD;  Location: [facility];  Service: Cardiovascular;  Laterality: N/A;   POLYPECTOMY  06/27/2023   Procedure: POLYPECTOMY;  Surgeon: Hargis Lias, MD;  Location: [facility];  Service: Endoscopy;;   TOTAL KNEE ARTHROPLASTY Left 03/15/2022   Procedure: TOTAL KNEE ARTHROPLASTY;  Surgeon: Darrin Emerald, MD;  Location: [facility];  Service: Orthopedics;  Laterality: Left;   TOTAL KNEE ARTHROPLASTY Right 02/21/2023   Procedure: TOTAL KNEE ARTHROPLASTY;  Surgeon: Darrin Emerald, MD;  Location: [facility];  Service: Orthopedics;  Laterality: Right;   Patient Active Problem List   Diagnosis Date Noted   Acute left-sided low back pain without sciatica 10/24/2023   Adenomatous polyp of sigmoid colon 06/27/2023   Chronic congestive heart failure (HCC) 06/05/2023   Superficial fungus infection of skin 05/19/2023   Left breast abscess 05/19/2023   OAB  (overactive bladder) 03/23/2023   S/P total knee replacement, right 02/21/23 03/08/2023   Primary osteoarthritis of right knee 02/21/2023   Lesion of vulva 10/06/2022   Lichen sclerosus et atrophicus 10/06/2022   S/P hysterectomy with oophorectomy 10/06/2022   Hot flashes 10/06/2022   Current use of estrogen therapy 10/06/2022   Anxiety 03/22/2022   Asthma 03/22/2022   Bipolar 1 disorder (HCC) 03/22/2022   Chronic pain 03/22/2022   Edema 03/22/2022   Gastro-esophageal reflux 03/22/2022   Hyperlipidemia 03/22/2022   Insomnia 03/22/2022   Menopausal symptom 03/22/2022   Sinusitis 03/22/2022   Type II diabetes mellitus (HCC) 03/22/2022   Osteoarthritis of left knee 03/15/2022   History of operative procedure on lumbosacral spinal structure 02/15/2022   Urge incontinence 09/02/2021   Cervical radiculopathy 12/30/2020   Pseudoarthrosis of lumbar spine 08/25/2020   Essential (primary) hypertension 06/24/2020   Body mass index (BMI) 45.0-49.9, adult (HCC) 05/06/2020   Carpal tunnel syndrome 04/01/2020   Spondylolisthesis, lumbar region 11/22/2019   Localized swelling of both lower legs 11/12/2019   Lumbar stenosis 11/05/2019   S/P left knee arthroscopy 06/27/2019 07/01/2019   Derangement of posterior horn of medial meniscus of left knee    Primary osteoarthritis of left knee    S/P right knee arthroscopy 05/09/19 05/29/2019   Depression 07/24/2014   LOWER LEG, ARTHRITIS, DEGEN./OSTEO 02/18/2009   DERANGEMENT MENISCUS 02/18/2009   ANSERINE BURSITIS, RIGHT 02/18/2009   REFERRING PROVIDER: Michae Aden, NP   REFERRING DIAG: Low back pain, unspecified, Spondylosis without myelopathy or radiculopathy, lumbar region, History of lumbar spinal fusion   Rationale for Evaluation and Treatment: Rehabilitation  THERAPY DIAG:  Other low back pain  ONSET DATE: years  SUBJECTIVE:                                                                                                                                                                                            SUBJECTIVE STATEMENT: Pt reports 8/10 low back pain especially on right side today.    PERTINENT HISTORY:  Hypertension, CHF, asthma, type 2 diabetes, osteoarthritis, depression, anxiety, bipolar 1 disorder, and history of  cancer  PAIN:  Are you having pain? Yes: NPRS scale: 7/10 Pain location: low back and right hip Pain description: constant, sharp pain, tingling Aggravating factors: bending, twisting Relieving factors: pain medication and laying down  PRECAUTIONS: None  RED FLAGS: None   WEIGHT BEARING RESTRICTIONS: No  FALLS:  Has patient fallen in last 6 months? No, but I can tell that my balance is off  LIVING ENVIRONMENT: Lives with: lives with their family Lives in: House/apartment Stairs: Yes: External: 3 steps; on left going up; step to pattern Has following equipment at home: None  OCCUPATION: disabled  PLOF: Independent  PATIENT GOALS: reduced pain  NEXT MD VISIT: none scheduled  OBJECTIVE:  Note: Objective measures were completed at Evaluation unless otherwise noted.  PATIENT SURVEYS:  Modified Oswestry 40% disability   COGNITION: Overall cognitive status: Within functional limits for tasks assessed     SENSATION: Patient reports no altered sensation.   POSTURE: rounded shoulders, forward head, and flexed trunk   PALPATION: TTP: left PSIS, right gluteals, latissimus dorsi, QL, and lumbar paraspinals  JOINT MOBILITY:  T10-L5: hypomobile and painful  LUMBAR ROM:   AROM eval  Flexion 24  Extension 10  Right lateral flexion 50% limited  Left lateral flexion 50% limited; pulls a little bit  Right rotation 50% limited; familiar pain  Left rotation 50% limited   (Blank rows = not tested)  LOWER EXTREMITY ROM: WFL for activities assessed  LOWER EXTREMITY MMT:    MMT Right eval Left eval  Hip flexion 3+/5 3+/5  Hip extension    Hip abduction     Hip adduction    Hip internal rotation    Hip external rotation    Knee flexion 4+/5 4+/5  Knee extension 3/5 4-/5  Ankle dorsiflexion 3+/5 3+/5  Ankle plantarflexion    Ankle inversion    Ankle eversion     (Blank rows = not tested)  GAIT: Assistive device utilized: None Level of assistance: Complete Independence Comments: decreased gait speed and stride length  TREATMENT DATE:  12/25/23:  Nustep level 3 x 15 minutes f/b STW/M x 8 minutes to patient's right lumbar musculature f/b IFC at 80-150 Hz on 40% scan x 20 minutes.    12/21/23                                    EXERCISE LOG  Exercise Repetitions and Resistance Comments  Nustep Lvl 3 x 15 mins        Blank cell = exercise not performed today   Manual Therapy Soft Tissue Mobilization: lumbar, STW/M to right lumbar paraspinals and QL to decrease pain and tone, with pt in left side-lying for comfort   Modalities  Date:  Unattended Estim: Lumbar, IFC 80-150 Hz, 15 mins, Pain and Tone Combo: Lumbar, 1.5 w/cm2, 12 mins, Pain and Tone   12/15/23:                                     EXERCISE LOG  Nustep level 3 x 20 minutes f/b STW/M with ischemic release utilized to patient's lumbar musculature, right > left (patient seated with UE's resting on two pillows for comfort f/b HMP and IFC at 80-150 Hz on 40% scan   Normal modality response following removal of modality.  PATIENT EDUCATION:  Education details: POC and HEP Person educated: Patient Education method: Explanation Education comprehension: verbalized understanding  HOME EXERCISE PROGRAM:   ASSESSMENT:  CLINICAL IMPRESSION: Patient presenting to the clinic with a high pain-level especially on the right.  She states she is going in for RFA procedure next week.  She reported feeling better after treatment.    OBJECTIVE IMPAIRMENTS:  Abnormal gait, decreased activity tolerance, decreased mobility, difficulty walking, decreased ROM, decreased strength, hypomobility, impaired tone, postural dysfunction, and pain.   ACTIVITY LIMITATIONS: lifting, bending, sleeping, stairs, and locomotion level  PARTICIPATION LIMITATIONS: cleaning, community activity, and yard work  PERSONAL FACTORS: Past/current experiences, Time since onset of injury/illness/exacerbation, and 3+ comorbidities: Hypertension, CHF, asthma, type 2 diabetes, osteoarthritis, depression, anxiety, bipolar 1 disorder, and history of cancer are also affecting patient's functional outcome.   REHAB POTENTIAL: Fair    CLINICAL DECISION MAKING: Evolving/moderate complexity  EVALUATION COMPLEXITY: Moderate   GOALS: Goals reviewed with patient? Yes  SHORT TERM GOALS: Target date: 12/19/23  Patient will be independent with her initial HEP.  Baseline: Goal status: MET  2.  Patient will be able to complete her daily activities without her familiar pain exceeding 8/10. Baseline: 6/6: 9/10 with vacuuming Goal status: IN PROGRESS  3.  Patient will improve her ODI score to at least 30% disability or less for improved function with her daily activities. Baseline: 32% Goal status: IN PROGRESS  LONG TERM GOALS: Target date: 01/09/24  Patient will be independent with her advanced HEP.  Baseline:  Goal status: IN PROGRESS  2.  Patient will be able to complete her daily activities without her familiar pain exceeding 6/10. Baseline:  Goal status: IN PROGRESS  3.  Patient will improve her lumbar flexion to at least 35 degrees for improved function picking up items from the floor.  Baseline:  Goal status: IN PROGRESS  4.  Patient will improve her right quadriceps strength to at least 4-/5 for improved function with transfers.  Baseline:  Goal status: IN PROGRESS  PLAN:  PT FREQUENCY: 1-2x/week  PT DURATION: 6 weeks  PLANNED INTERVENTIONS: [id number]- PT  Re-evaluation, 97750- Physical Performance Testing, 97110-Therapeutic exercises, 97530- Therapeutic activity, [id number]- Neuromuscular re-education, 97535- Self Care, [id number]- Manual therapy, [phone]- Gait training, [phone]- Electrical stimulation (unattended), [phone]- Ultrasound, Patient/Family education, Balance training, Stair training, Dry Needling, Joint mobilization, Spinal mobilization, Cryotherapy, and Moist heat.  PLAN FOR NEXT SESSION: Nustep, isometrics, manual therapy, lower extremity strengthening, and modalities as needed   Cheyeanne Roadcap, [place], PT 12/25/2023, 8:58 AM

## 2023-12-28 ENCOUNTER — Ambulatory Visit

## 2023-12-28 DIAGNOSIS — M5459 Other low back pain: Secondary | ICD-10-CM | POA: Diagnosis not present

## 2023-12-28 DIAGNOSIS — M25551 Pain in right hip: Secondary | ICD-10-CM

## 2023-12-28 NOTE — Therapy (Signed)
 OUTPATIENT PHYSICAL THERAPY THORACOLUMBAR TREATMENT   Patient Name: [name] MRN: [id number] DOB:June 09, 1961, 63 y.o., female Today's Date: 12/28/2023  END OF SESSION:  PT End of Session - 12/28/23 0837     Visit Number 9    Number of Visits 12    Date for PT Re-Evaluation 02/02/24    PT Start Time 0800    PT Stop Time 0848    PT Time Calculation (min) 48 min    Activity Tolerance Patient tolerated treatment well    Behavior During Therapy [facility] for tasks assessed/performed             Past Medical History:  Diagnosis Date   Anxiety    Arthritis    Asthma    Cancer (HCC)    cervical cancer; pt states a long long time ago   CHF (congestive heart failure) (HCC)    Coronary artery disease    Depression    Diabetes mellitus without complication (HCC)    Dyspnea    Edema of all four extremities    Essential (primary) hypertension 06/24/2020   Hyperlipemia    Type II diabetes mellitus (HCC) 03/22/2022   Past Surgical History:  Procedure Laterality Date   ABDOMINAL HYSTERECTOMY     ANTERIOR LAT LUMBAR FUSION N/A 11/05/2019   Procedure: Lumbar Four-Five Anterolateral lumbar interbody fusion with lateral plate;  Surgeon: Elna Haggis, MD;  Location: [facility] OR;  Service: Neurosurgery;  Laterality: N/A;  anterolateral   BACK SURGERY     pins   BACK SURGERY  07/11/2008   BIOPSY  06/29/2021   Procedure: BIOPSY;  Surgeon: Urban Garden, MD;  Location: [facility];  Service: Gastroenterology;;   BLADDER REPAIR     CARPAL TUNNEL RELEASE Right    CESAREAN SECTION  1982, 1984   COLONOSCOPY N/A 06/12/2013   rehman: normal   COLONOSCOPY WITH PROPOFOL  N/A 06/27/2023   Procedure: COLONOSCOPY WITH PROPOFOL ;  Surgeon: Hargis Lias, MD;  Location: [facility];  Service: Endoscopy;  Laterality: N/A;  2:00PM;ASA 3   CORONARY PRESSURE/FFR STUDY N/A 10/20/2022   Procedure: CORONARY PRESSURE/FFR STUDY;  Surgeon: Kyra Phy, MD;  Location: [facility];  Service: Cardiovascular;  Laterality: N/A;   CYSTOSCOPY WITH INJECTION N/A 03/23/2023   Procedure: CYSTOSCOPY WITH INJECTION-botox ;  Surgeon: Marco Severs, MD;  Location: [facility] ORS;  Service: Urology;  Laterality: N/A;   ESOPHAGOGASTRODUODENOSCOPY (EGD) WITH PROPOFOL  N/A 06/29/2021   Procedure: ESOPHAGOGASTRODUODENOSCOPY (EGD) WITH PROPOFOL ;  Surgeon: Urban Garden, MD;  Location: [facility];  Service: Gastroenterology;  Laterality: N/A;  8:40   KNEE ARTHROSCOPY WITH MEDIAL MENISECTOMY Right 05/09/2019   Procedure: KNEE ARTHROSCOPY WITH MEDIAL MENISECTOMY AND LATERAL MENISECTOMY;  Surgeon: Darrin Emerald, MD;  Location: [facility];  Service: Orthopedics;  Laterality: Right;   KNEE ARTHROSCOPY WITH MEDIAL MENISECTOMY Left 06/27/2019   Procedure: KNEE ARTHROSCOPY WITH MEDIAL MENISCECTOMY;  Surgeon: Darrin Emerald, MD;  Location: [facility];  Service: Orthopedics;  Laterality: Left;   LEFT HEART CATH AND CORONARY ANGIOGRAPHY N/A 10/20/2022   Procedure: LEFT HEART CATH AND CORONARY ANGIOGRAPHY;  Surgeon: Kyra Phy, MD;  Location: [facility];  Service: Cardiovascular;  Laterality: N/A;   POLYPECTOMY  06/27/2023   Procedure: POLYPECTOMY;  Surgeon: Hargis Lias, MD;  Location: [facility];  Service: Endoscopy;;   TOTAL KNEE ARTHROPLASTY Left 03/15/2022   Procedure: TOTAL KNEE ARTHROPLASTY;  Surgeon: Darrin Emerald, MD;  Location: [facility];  Service:  Orthopedics;  Laterality: Left;   TOTAL KNEE ARTHROPLASTY Right 02/21/2023   Procedure: TOTAL KNEE ARTHROPLASTY;  Surgeon: Darrin Emerald, MD;  Location: [facility];  Service: Orthopedics;  Laterality: Right;   Patient Active Problem List   Diagnosis Date Noted   Acute left-sided low back pain without sciatica 10/24/2023   Adenomatous polyp of sigmoid colon 06/27/2023   Chronic congestive heart failure (HCC) 06/05/2023   Superficial fungus infection of skin 05/19/2023   Left breast abscess 05/19/2023   OAB  (overactive bladder) 03/23/2023   S/P total knee replacement, right 02/21/23 03/08/2023   Primary osteoarthritis of right knee 02/21/2023   Lesion of vulva 10/06/2022   Lichen sclerosus et atrophicus 10/06/2022   S/P hysterectomy with oophorectomy 10/06/2022   Hot flashes 10/06/2022   Current use of estrogen therapy 10/06/2022   Anxiety 03/22/2022   Asthma 03/22/2022   Bipolar 1 disorder (HCC) 03/22/2022   Chronic pain 03/22/2022   Edema 03/22/2022   Gastro-esophageal reflux 03/22/2022   Hyperlipidemia 03/22/2022   Insomnia 03/22/2022   Menopausal symptom 03/22/2022   Sinusitis 03/22/2022   Type II diabetes mellitus (HCC) 03/22/2022   Osteoarthritis of left knee 03/15/2022   History of operative procedure on lumbosacral spinal structure 02/15/2022   Urge incontinence 09/02/2021   Cervical radiculopathy 12/30/2020   Pseudoarthrosis of lumbar spine 08/25/2020   Essential (primary) hypertension 06/24/2020   Body mass index (BMI) 45.0-49.9, adult (HCC) 05/06/2020   Carpal tunnel syndrome 04/01/2020   Spondylolisthesis, lumbar region 11/22/2019   Localized swelling of both lower legs 11/12/2019   Lumbar stenosis 11/05/2019   S/P left knee arthroscopy 06/27/2019 07/01/2019   Derangement of posterior horn of medial meniscus of left knee    Primary osteoarthritis of left knee    S/P right knee arthroscopy 05/09/19 05/29/2019   Depression 07/24/2014   LOWER LEG, ARTHRITIS, DEGEN./OSTEO 02/18/2009   DERANGEMENT MENISCUS 02/18/2009   ANSERINE BURSITIS, RIGHT 02/18/2009   REFERRING PROVIDER: Michae Aden, NP   REFERRING DIAG: Low back pain, unspecified, Spondylosis without myelopathy or radiculopathy, lumbar region, History of lumbar spinal fusion   Rationale for Evaluation and Treatment: Rehabilitation  THERAPY DIAG:  Other low back pain  Pain in right hip  ONSET DATE: years  SUBJECTIVE:                                                                                                                                                                                            SUBJECTIVE STATEMENT: Patient reports that she felt good after her last appointment, but she is hurting still today.   PERTINENT HISTORY:  Hypertension, CHF, asthma, type  2 diabetes, osteoarthritis, depression, anxiety, bipolar 1 disorder, and history of cancer  PAIN:  Are you having pain? Yes: NPRS scale: 8/10 Pain location: low back and right hip Pain description: constant, sharp pain, tingling Aggravating factors: bending, twisting Relieving factors: pain medication and laying down  PRECAUTIONS: None  RED FLAGS: None   WEIGHT BEARING RESTRICTIONS: No  FALLS:  Has patient fallen in last 6 months? No, but I can tell that my balance is off  LIVING ENVIRONMENT: Lives with: lives with their family Lives in: House/apartment Stairs: Yes: External: 3 steps; on left going up; step to pattern Has following equipment at home: None  OCCUPATION: disabled  PLOF: Independent  PATIENT GOALS: reduced pain  NEXT MD VISIT: none scheduled  OBJECTIVE:  Note: Objective measures were completed at Evaluation unless otherwise noted.  PATIENT SURVEYS:  Modified Oswestry 40% disability   COGNITION: Overall cognitive status: Within functional limits for tasks assessed     SENSATION: Patient reports no altered sensation.   POSTURE: rounded shoulders, forward head, and flexed trunk   PALPATION: TTP: left PSIS, right gluteals, latissimus dorsi, QL, and lumbar paraspinals  JOINT MOBILITY:  T10-L5: hypomobile and painful  LUMBAR ROM:   AROM eval  Flexion 24  Extension 10  Right lateral flexion 50% limited  Left lateral flexion 50% limited; pulls a little bit  Right rotation 50% limited; familiar pain  Left rotation 50% limited   (Blank rows = not tested)  LOWER EXTREMITY ROM: WFL for activities assessed  LOWER EXTREMITY MMT:    MMT Right eval Left eval  Hip  flexion 3+/5 3+/5  Hip extension    Hip abduction    Hip adduction    Hip internal rotation    Hip external rotation    Knee flexion 4+/5 4+/5  Knee extension 3/5 4-/5  Ankle dorsiflexion 3+/5 3+/5  Ankle plantarflexion    Ankle inversion    Ankle eversion     (Blank rows = not tested)  GAIT: Assistive device utilized: None Level of assistance: Complete Independence Comments: decreased gait speed and stride length  TREATMENT DATE:                                   12/28/23 EXERCISE LOG  Exercise Repetitions and Resistance Comments  Nustep  L4 x 15 minutes    Manual therapy  See below                Blank cell = exercise not performed today  Manual Therapy Soft Tissue Mobilization: right lumbar paraspinals, QL, gluteals, and piriformis, for reduced pain and tone  Joint Mobilizations: L1-5, grade I-II CPA's  Modalities: no redness or adverse reaction to today's modalities   Date:  Unattended Estim: Lumbar, IFC @ 80-150 Hz w/ 40% scan, 15 mins, Pain and Tone  12/25/23:  Nustep level 3 x 15 minutes f/b STW/M x 8 minutes to patient's right lumbar musculature f/b IFC at 80-150 Hz on 40% scan x 20 minutes.    12/21/23                                    EXERCISE LOG  Exercise Repetitions and Resistance Comments  Nustep Lvl 3 x 15 mins        Blank cell = exercise not performed today   Manual Therapy Soft Tissue Mobilization:  lumbar, STW/M to right lumbar paraspinals and QL to decrease pain and tone, with pt in left side-lying for comfort   Modalities  Date:  Unattended Estim: Lumbar, IFC 80-150 Hz, 15 mins, Pain and Tone Combo: Lumbar, 1.5 w/cm2, 12 mins, Pain and Tone   12/15/23:                                     EXERCISE LOG  Nustep level 3 x 20 minutes f/b STW/M with ischemic release utilized to patient's lumbar musculature, right > left (patient seated with UE's resting on two pillows for comfort f/b HMP and IFC at 80-150 Hz on 40% scan   Normal modality response  following removal of modality.                                                                                                                              PATIENT EDUCATION:  Education details: POC  Person educated: Patient Education method: Explanation Education comprehension: verbalized understanding  HOME EXERCISE PROGRAM:   ASSESSMENT:  CLINICAL IMPRESSION: Today's treatment focused on familiar interventions due to her high pain severity. Soft tissue mobilization to her right lumbar paraspinals were the most effective at reducing her familiar symptoms. She reported feeling better upon the conclusion of treatment. She continues to require skilled physical therapy to address her remaining impairments to maximize her functional mobility.   OBJECTIVE IMPAIRMENTS: Abnormal gait, decreased activity tolerance, decreased mobility, difficulty walking, decreased ROM, decreased strength, hypomobility, impaired tone, postural dysfunction, and pain.   ACTIVITY LIMITATIONS: lifting, bending, sleeping, stairs, and locomotion level  PARTICIPATION LIMITATIONS: cleaning, community activity, and yard work  PERSONAL FACTORS: Past/current experiences, Time since onset of injury/illness/exacerbation, and 3+ comorbidities: Hypertension, CHF, asthma, type 2 diabetes, osteoarthritis, depression, anxiety, bipolar 1 disorder, and history of cancer are also affecting patient's functional outcome.   REHAB POTENTIAL: Fair    CLINICAL DECISION MAKING: Evolving/moderate complexity  EVALUATION COMPLEXITY: Moderate   GOALS: Goals reviewed with patient? Yes  SHORT TERM GOALS: Target date: 12/19/23  Patient will be independent with her initial HEP.  Baseline: Goal status: MET  2.  Patient will be able to complete her daily activities without her familiar pain exceeding 8/10. Baseline: 6/6: 9/10 with vacuuming Goal status: IN PROGRESS  3.  Patient will improve her ODI score to at least 30% disability or  less for improved function with her daily activities. Baseline: 32% Goal status: IN PROGRESS  LONG TERM GOALS: Target date: 01/09/24  Patient will be independent with her advanced HEP.  Baseline:  Goal status: IN PROGRESS  2.  Patient will be able to complete her daily activities without her familiar pain exceeding 6/10. Baseline:  Goal status: IN PROGRESS  3.  Patient will improve her lumbar flexion to at least 35 degrees for improved function picking up items from the floor.  Baseline:  Goal  status: IN PROGRESS  4.  Patient will improve her right quadriceps strength to at least 4-/5 for improved function with transfers.  Baseline:  Goal status: IN PROGRESS  PLAN:  PT FREQUENCY: 1-2x/week  PT DURATION: 6 weeks  PLANNED INTERVENTIONS: 97164- PT Re-evaluation, 97750- Physical Performance Testing, 97110-Therapeutic exercises, 97530- Therapeutic activity, [id number]- Neuromuscular re-education, 97535- Self Care, 16109- Manual therapy, [phone]- Gait training, [phone]- Electrical stimulation (unattended), [phone]- Ultrasound, Patient/Family education, Balance training, Stair training, Dry Needling, Joint mobilization, Spinal mobilization, Cryotherapy, and Moist heat.  PLAN FOR NEXT SESSION: Nustep, isometrics, manual therapy, lower extremity strengthening, and modalities as needed   Lane Pinon, PT 12/28/2023, 8:50 AM

## 2024-01-03 ENCOUNTER — Other Ambulatory Visit: Payer: Self-pay

## 2024-01-03 ENCOUNTER — Ambulatory Visit: Admitting: Urology

## 2024-01-03 VITALS — BP 126/72 | HR 102

## 2024-01-03 DIAGNOSIS — R399 Unspecified symptoms and signs involving the genitourinary system: Secondary | ICD-10-CM

## 2024-01-03 DIAGNOSIS — N3281 Overactive bladder: Secondary | ICD-10-CM

## 2024-01-03 LAB — URINALYSIS, ROUTINE W REFLEX MICROSCOPIC
Bilirubin, UA: NEGATIVE
Ketones, UA: NEGATIVE
Leukocytes,UA: NEGATIVE
Nitrite, UA: POSITIVE — AB
Protein,UA: NEGATIVE
RBC, UA: NEGATIVE
Specific Gravity, UA: <1.005 — ABNORMAL LOW (ref 1.005–1.030)
Urobilinogen, Ur: 0.2 mg/dL (ref 0.2–1.0)
pH, UA: 6 (ref 5.0–7.5)

## 2024-01-03 LAB — MICROSCOPIC EXAMINATION

## 2024-01-03 MED ORDER — CIPROFLOXACIN HCL 500 MG PO TABS: 500.0000 mg | ORAL_TABLET | Freq: Once | ORAL | Status: DC

## 2024-01-03 MED ORDER — NITROFURANTOIN MONOHYD MACRO 100 MG PO CAPS: 100.0000 mg | ORAL_CAPSULE | Freq: Two times a day (BID) | ORAL | 0 refills | Status: DC

## 2024-01-03 MED ORDER — ONABOTULINUMTOXINA 100 UNITS IJ SOLR: 200.0000 [IU] | Freq: Once | INTRAMUSCULAR | Status: DC

## 2024-01-03 NOTE — Progress Notes (Addendum)
 Patient presents today for bladder botox  procedure.  Patients ua came back contaminated.  Verbal from MD to send for culture and send in Macrobid  100mg  BID for 7 days.  Rx sent to pharmacy.  Patient aware botox  will be rescheduled and she will need a lab urine drop off a week prior to next scheduled botox .

## 2024-01-07 LAB — URINE CULTURE

## 2024-01-09 ENCOUNTER — Ambulatory Visit: Payer: Self-pay

## 2024-01-09 NOTE — Telephone Encounter (Signed)
 Patient started treatment on 01/03/24

## 2024-01-09 NOTE — Telephone Encounter (Signed)
-----   Message from Belvie Clara sent at 01/09/2024  8:03 AM EDT ----- Macrobid  100mg  BID for 7 days ----- Message ----- From: Rebecka Memos Lab Results In Sent: 01/03/2024   3:36 PM EDT To: Belvie LITTIE Clara, MD

## 2024-01-11 ENCOUNTER — Ambulatory Visit: Attending: Nurse Practitioner | Admitting: Physical Therapy

## 2024-01-11 DIAGNOSIS — M25551 Pain in right hip: Secondary | ICD-10-CM | POA: Diagnosis present

## 2024-01-11 DIAGNOSIS — M5459 Other low back pain: Secondary | ICD-10-CM | POA: Insufficient documentation

## 2024-01-11 NOTE — Therapy (Signed)
 OUTPATIENT PHYSICAL THERAPY THORACOLUMBAR TREATMENT   Patient Name: [name] MRN: [id number] DOB:03/12/1961, 63 y.o., female Today's Date: 01/11/2024  END OF SESSION:       Past Medical History:  Diagnosis Date   Anxiety    Arthritis    Asthma    Cancer (HCC)    cervical cancer; pt states a long long time ago   CHF (congestive heart failure) (HCC)    Coronary artery disease    Depression    Diabetes mellitus without complication (HCC)    Dyspnea    Edema of all four extremities    Essential (primary) hypertension 06/24/2020   Hyperlipemia    Type II diabetes mellitus (HCC) 03/22/2022   Past Surgical History:  Procedure Laterality Date   ABDOMINAL HYSTERECTOMY     ANTERIOR LAT LUMBAR FUSION N/A 11/05/2019   Procedure: Lumbar Four-Five Anterolateral lumbar interbody fusion with lateral plate;  Surgeon: Colon Shove, MD;  Location: [facility];  Service: Neurosurgery;  Laterality: N/A;  anterolateral   BACK SURGERY     pins   BACK SURGERY  07/11/2008   BIOPSY  06/29/2021   Procedure: BIOPSY;  Surgeon: Eartha Angelia Sieving, MD;  [facility]: [facility];  Service: Gastroenterology;;   BLADDER REPAIR     CARPAL TUNNEL RELEASE Right    CESAREAN SECTION  1982, 1984   COLONOSCOPY N/A 06/12/2013   rehman: normal   COLONOSCOPY WITH PROPOFOL  N/A 06/27/2023   Procedure: COLONOSCOPY WITH PROPOFOL ;  Surgeon: Cinderella Deatrice FALCON, MD;  [facility]: [facility];  Service: Endoscopy;  Laterality: N/A;  2:00PM;ASA 3   CORONARY PRESSURE/FFR STUDY N/A 10/20/2022   Procedure: CORONARY PRESSURE/FFR STUDY;  Surgeon: Wendel Lurena POUR, MD;  [facility]: [facility];  Service: Cardiovascular;  Laterality: N/A;   CYSTOSCOPY WITH INJECTION N/A 03/23/2023   Procedure: CYSTOSCOPY WITH INJECTION-botox ;  Surgeon: Sherrilee Belvie CROME, MD;  Location: [facility];  Service: Urology;  Laterality: N/A;   ESOPHAGOGASTRODUODENOSCOPY (EGD) WITH PROPOFOL  N/A 06/29/2021   Procedure:  ESOPHAGOGASTRODUODENOSCOPY (EGD) WITH PROPOFOL ;  Surgeon: Eartha Angelia Sieving, MD;  [facility];  Service: Gastroenterology;  Laterality: N/A;  8:40   KNEE ARTHROSCOPY WITH MEDIAL MENISECTOMY Right 05/09/2019   Procedure: KNEE ARTHROSCOPY WITH MEDIAL MENISECTOMY AND LATERAL MENISECTOMY;  Surgeon: Margrette Taft BRAVO, MD;  Location: [facility];  Service: Orthopedics;  Laterality: Right;   KNEE ARTHROSCOPY WITH MEDIAL MENISECTOMY Left 06/27/2019   Procedure: KNEE ARTHROSCOPY WITH MEDIAL MENISCECTOMY;  Surgeon: Margrette Taft BRAVO, MD;  Location: [facility];  Service: Orthopedics;  Laterality: Left;   LEFT HEART CATH AND CORONARY ANGIOGRAPHY N/A 10/20/2022   Procedure: LEFT HEART CATH AND CORONARY ANGIOGRAPHY;  Surgeon: Wendel Lurena POUR, MD;  [facility];  Service: Cardiovascular;  Laterality: N/A;   POLYPECTOMY  06/27/2023   Procedure: POLYPECTOMY;  Surgeon: Cinderella Deatrice FALCON, MD;  [facility];  Service: Endoscopy;;   TOTAL KNEE ARTHROPLASTY Left 03/15/2022   Procedure: TOTAL KNEE ARTHROPLASTY;  Surgeon: Margrette Taft BRAVO, MD;  Location: [facility];  Service: Orthopedics;  Laterality: Left;   TOTAL KNEE ARTHROPLASTY Right 02/21/2023   Procedure: TOTAL KNEE ARTHROPLASTY;  Surgeon: Margrette Taft BRAVO, MD;  Location: [facility];  Service: Orthopedics;  Laterality: Right;   Patient Active Problem List   Diagnosis Date Noted   Acute left-sided low back pain without sciatica 10/24/2023   Adenomatous polyp of sigmoid colon 06/27/2023   Chronic congestive heart failure (HCC) 06/05/2023   Superficial fungus infection of skin 05/19/2023  Left breast abscess 05/19/2023   OAB (overactive bladder) 03/23/2023   S/P total knee replacement, right 02/21/23 03/08/2023   Primary osteoarthritis of right knee 02/21/2023   Lesion of vulva 10/06/2022   Lichen sclerosus et atrophicus 10/06/2022   S/P hysterectomy with oophorectomy 10/06/2022   Hot flashes 10/06/2022   Current use of  estrogen therapy 10/06/2022   Anxiety 03/22/2022   Asthma 03/22/2022   Bipolar 1 disorder (HCC) 03/22/2022   Chronic pain 03/22/2022   Edema 03/22/2022   Gastro-esophageal reflux 03/22/2022   Hyperlipidemia 03/22/2022   Insomnia 03/22/2022   Menopausal symptom 03/22/2022   Sinusitis 03/22/2022   Type II diabetes mellitus (HCC) 03/22/2022   Osteoarthritis of left knee 03/15/2022   History of operative procedure on lumbosacral spinal structure 02/15/2022   Urge incontinence 09/02/2021   Cervical radiculopathy 12/30/2020   Pseudoarthrosis of lumbar spine 08/25/2020   Essential (primary) hypertension 06/24/2020   Body mass index (BMI) 45.0-49.9, adult (HCC) 05/06/2020   Carpal tunnel syndrome 04/01/2020   Spondylolisthesis, lumbar region 11/22/2019   Localized swelling of both lower legs 11/12/2019   Lumbar stenosis 11/05/2019   S/P left knee arthroscopy 06/27/2019 07/01/2019   Derangement of posterior horn of medial meniscus of left knee    Primary osteoarthritis of left knee    S/P right knee arthroscopy 05/09/19 05/29/2019   Depression 07/24/2014   LOWER LEG, ARTHRITIS, DEGEN./OSTEO 02/18/2009   DERANGEMENT MENISCUS 02/18/2009   ANSERINE BURSITIS, RIGHT 02/18/2009   REFERRING PROVIDER: Daris Merlynn BRAVO, NP   REFERRING DIAG: Low back pain, unspecified, Spondylosis without myelopathy or radiculopathy, lumbar region, History of lumbar spinal fusion   Rationale for Evaluation and Treatment: Rehabilitation  THERAPY DIAG:  Other low back pain  ONSET DATE: years  SUBJECTIVE:                                                                                                                                                                                           SUBJECTIVE STATEMENT: Patient had an RFA procedure at L1-2, and L2-3 which she states was helpful.  She states now she hurts more in her lower back.  Rated at an 8.  PERTINENT HISTORY:  Hypertension, CHF, asthma,  type 2 diabetes, osteoarthritis, depression, anxiety, bipolar 1 disorder, and history of cancer  PAIN:  Are you having pain? Yes: NPRS scale: 8/10 Pain location: low back and right hip Pain description: constant, sharp pain, tingling Aggravating factors: bending, twisting Relieving factors: pain medication and laying down  PRECAUTIONS: None  RED FLAGS: None   WEIGHT BEARING RESTRICTIONS: No  FALLS:  Has patient fallen in last 6 months? No, but  I can tell that my balance is off  LIVING ENVIRONMENT: Lives with: lives with their family Lives in: House/apartment Stairs: Yes: External: 3 steps; on left going up; step to pattern Has following equipment at home: None  OCCUPATION: disabled  PLOF: Independent  PATIENT GOALS: reduced pain  NEXT MD VISIT: none scheduled  OBJECTIVE:  Note: Objective measures were completed at Evaluation unless otherwise noted.  PATIENT SURVEYS:  Modified Oswestry 40% disability   COGNITION: Overall cognitive status: Within functional limits for tasks assessed     SENSATION: Patient reports no altered sensation.   POSTURE: rounded shoulders, forward head, and flexed trunk   PALPATION: TTP: left PSIS, right gluteals, latissimus dorsi, QL, and lumbar paraspinals  JOINT MOBILITY:  T10-L5: hypomobile and painful  LUMBAR ROM:   AROM eval  Flexion 24  Extension 10  Right lateral flexion 50% limited  Left lateral flexion 50% limited; pulls a little bit  Right rotation 50% limited; familiar pain  Left rotation 50% limited   (Blank rows = not tested)  LOWER EXTREMITY ROM: WFL for activities assessed  LOWER EXTREMITY MMT:    MMT Right eval Left eval  Hip flexion 3+/5 3+/5  Hip extension    Hip abduction    Hip adduction    Hip internal rotation    Hip external rotation    Knee flexion 4+/5 4+/5  Knee extension 3/5 4-/5  Ankle dorsiflexion 3+/5 3+/5  Ankle plantarflexion    Ankle inversion    Ankle eversion     (Blank  rows = not tested)  GAIT: Assistive device utilized: None Level of assistance: Complete Independence Comments: decreased gait speed and stride length  TREATMENT DATE:  01/11/24:  Nustep level 3 x 15 minutes STW/M x 8 minutes to patient's bilateral lower lumbar region f/b IFC at 80-150 Hz on 40% scan x 20 minutes.                                     12/28/23 EXERCISE LOG  Exercise Repetitions and Resistance Comments  Nustep  L4 x 15 minutes    Manual therapy  See below                Blank cell = exercise not performed today  Manual Therapy Soft Tissue Mobilization: right lumbar paraspinals, QL, gluteals, and piriformis, for reduced pain and tone  Joint Mobilizations: L1-5, grade I-II CPA's  Modalities: no redness or adverse reaction to today's modalities   Date:  Unattended Estim: Lumbar, IFC @ 80-150 Hz w/ 40% scan, 15 mins, Pain and Tone  12/25/23:  Nustep level 3 x 15 minutes f/b STW/M x 8 minutes to patient's right lumbar musculature f/b IFC at 80-150 Hz on 40% scan x 20 minutes.  PATIENT EDUCATION:  Education details: POC  Person educated: Patient Education method: Explanation Education comprehension: verbalized understanding  HOME EXERCISE PROGRAM:   ASSESSMENT:  CLINICAL IMPRESSION: Patient had an RFA procedure on 01/04/24 at L1-2 and L2-3 that was helpful.  She now reports increased pain at the lower lumbar levels.  Patient felt about the same after treatment.    OBJECTIVE IMPAIRMENTS: Abnormal gait, decreased activity tolerance, decreased mobility, difficulty walking, decreased ROM, decreased strength, hypomobility, impaired tone, postural dysfunction, and pain.   ACTIVITY LIMITATIONS: lifting, bending, sleeping, stairs, and locomotion level  PARTICIPATION LIMITATIONS: cleaning, community activity, and yard work  PERSONAL  FACTORS: Past/current experiences, Time since onset of injury/illness/exacerbation, and 3+ comorbidities: Hypertension, CHF, asthma, type 2 diabetes, osteoarthritis, depression, anxiety, bipolar 1 disorder, and history of cancer are also affecting patient's functional outcome.   REHAB POTENTIAL: Fair    CLINICAL DECISION MAKING: Evolving/moderate complexity  EVALUATION COMPLEXITY: Moderate   GOALS: Goals reviewed with patient? Yes  SHORT TERM GOALS: Target date: 12/19/23  Patient will be independent with her initial HEP.  Baseline: Goal status: MET  2.  Patient will be able to complete her daily activities without her familiar pain exceeding 8/10. Baseline: 6/6: 9/10 with vacuuming Goal status: IN PROGRESS  3.  Patient will improve her ODI score to at least 30% disability or less for improved function with her daily activities. Baseline: 32% Goal status: IN PROGRESS  LONG TERM GOALS: Target date: 01/09/24  Patient will be independent with her advanced HEP.  Baseline:  Goal status: IN PROGRESS  2.  Patient will be able to complete her daily activities without her familiar pain exceeding 6/10. Baseline:  Goal status: IN PROGRESS  3.  Patient will improve her lumbar flexion to at least 35 degrees for improved function picking up items from the floor.  Baseline:  Goal status: IN PROGRESS  4.  Patient will improve her right quadriceps strength to at least 4-/5 for improved function with transfers.  Baseline:  Goal status: IN PROGRESS  PLAN:  PT FREQUENCY: 1-2x/week  PT DURATION: 6 weeks  PLANNED INTERVENTIONS: 97164- PT Re-evaluation, 97750- Physical Performance Testing, 97110-Therapeutic exercises, 97530- Therapeutic activity, [id number]- Neuromuscular re-education, 97535- Self Care, 02859- Manual therapy, [phone]- Gait training, [phone]- Electrical stimulation (unattended), [phone]- Ultrasound, Patient/Family education, Balance training, Stair training, Dry Needling, Joint  mobilization, Spinal mobilization, Cryotherapy, and Moist heat.  PLAN FOR NEXT SESSION: Nustep, isometrics, manual therapy, lower extremity strengthening, and modalities as needed  Progress Note Reporting Period 11/28/23 to 01/11/24.  See note below for Objective Data and Assessment of Progress/Goals. Patient with continued c/o LBP.  LTG's remain unmet.  2 visits remaining.        Matvey Llanas, [place], PT 01/11/2024, 8:33 AM

## 2024-01-15 ENCOUNTER — Ambulatory Visit: Admitting: Physical Therapy

## 2024-01-15 DIAGNOSIS — M25551 Pain in right hip: Secondary | ICD-10-CM

## 2024-01-15 DIAGNOSIS — M5459 Other low back pain: Secondary | ICD-10-CM

## 2024-01-15 NOTE — Therapy (Signed)
 OUTPATIENT PHYSICAL THERAPY THORACOLUMBAR TREATMENT   Patient Name: [name] MRN: [id number] DOB:11-25-1960, 63 y.o., female Today's Date: 01/15/2024  END OF SESSION:  PT End of Session - 01/15/24 0805     Visit Number 11    Number of Visits 12    Date for PT Re-Evaluation 02/02/24    PT Start Time 0800    PT Stop Time 0849    PT Time Calculation (min) 49 min    Activity Tolerance Patient tolerated treatment well    Behavior During Therapy [facility] for tasks assessed/performed              Past Medical History:  Diagnosis Date   Anxiety    Arthritis    Asthma    Cancer (HCC)    cervical cancer; pt states a long long time ago   CHF (congestive heart failure) (HCC)    Coronary artery disease    Depression    Diabetes mellitus without complication (HCC)    Dyspnea    Edema of all four extremities    Essential (primary) hypertension 06/24/2020   Hyperlipemia    Type II diabetes mellitus (HCC) 03/22/2022   Past Surgical History:  Procedure Laterality Date   ABDOMINAL HYSTERECTOMY     ANTERIOR LAT LUMBAR FUSION N/A 11/05/2019   Procedure: Lumbar Four-Five Anterolateral lumbar interbody fusion with lateral plate;  Surgeon: Colon Shove, MD;  Location: [facility];  Service: Neurosurgery;  Laterality: N/A;  anterolateral   BACK SURGERY     pins   BACK SURGERY  07/11/2008   BIOPSY  06/29/2021   Procedure: BIOPSY;  Surgeon: Eartha Angelia Sieving, MD;  [facility];  Service: Gastroenterology;;   BLADDER REPAIR     CARPAL TUNNEL RELEASE Right    CESAREAN SECTION  1982, 1984   COLONOSCOPY N/A 06/12/2013   rehman: normal   COLONOSCOPY WITH PROPOFOL  N/A 06/27/2023   Procedure: COLONOSCOPY WITH PROPOFOL ;  Surgeon: Cinderella Deatrice FALCON, MD;  [facility];  Service: Endoscopy;  Laterality: N/A;  2:00PM;ASA 3   CORONARY PRESSURE/FFR STUDY N/A 10/20/2022   Procedure: CORONARY PRESSURE/FFR STUDY;  Surgeon: Wendel Lurena POUR, MD;  [facility];  Service: Cardiovascular;  Laterality: N/A;   CYSTOSCOPY WITH INJECTION N/A 03/23/2023   Procedure: CYSTOSCOPY WITH INJECTION-botox ;  Surgeon: Sherrilee Belvie CROME, MD;  Location: [facility];  Service: Urology;  Laterality: N/A;   ESOPHAGOGASTRODUODENOSCOPY (EGD) WITH PROPOFOL  N/A 06/29/2021   Procedure: ESOPHAGOGASTRODUODENOSCOPY (EGD) WITH PROPOFOL ;  Surgeon: Eartha Angelia Sieving, MD;  [facility];  Service: Gastroenterology;  Laterality: N/A;  8:40   KNEE ARTHROSCOPY WITH MEDIAL MENISECTOMY Right 05/09/2019   Procedure: KNEE ARTHROSCOPY WITH MEDIAL MENISECTOMY AND LATERAL MENISECTOMY;  Surgeon: Margrette Taft BRAVO, MD;  Location: [facility];  Service: Orthopedics;  Laterality: Right;   KNEE ARTHROSCOPY WITH MEDIAL MENISECTOMY Left 06/27/2019   Procedure: KNEE ARTHROSCOPY WITH MEDIAL MENISCECTOMY;  Surgeon: Margrette Taft BRAVO, MD;  Location: [facility];  Service: Orthopedics;  Laterality: Left;   LEFT HEART CATH AND CORONARY ANGIOGRAPHY N/A 10/20/2022   Procedure: LEFT HEART CATH AND CORONARY ANGIOGRAPHY;  Surgeon: Wendel Lurena POUR, MD;  [facility];  Service: Cardiovascular;  Laterality: N/A;   POLYPECTOMY  06/27/2023   Procedure: POLYPECTOMY;  Surgeon: Cinderella Deatrice FALCON, MD;  [facility];  Service: Endoscopy;;   TOTAL KNEE ARTHROPLASTY Left 03/15/2022   Procedure: TOTAL KNEE ARTHROPLASTY;  Surgeon: Margrette Taft BRAVO, MD;  Location: [facility];  Service: Orthopedics;  Laterality: Left;   TOTAL KNEE ARTHROPLASTY Right 02/21/2023   Procedure: TOTAL KNEE ARTHROPLASTY;  Surgeon: Margrette Taft BRAVO, MD;  Location: [facility];  Service: Orthopedics;  Laterality: Right;   Patient Active Problem List   Diagnosis Date Noted   Acute left-sided low back pain without sciatica 10/24/2023   Adenomatous polyp of sigmoid colon 06/27/2023   Chronic congestive heart failure (HCC) 06/05/2023   Superficial fungus infection of skin 05/19/2023   Left breast abscess 05/19/2023   OAB  (overactive bladder) 03/23/2023   S/P total knee replacement, right 02/21/23 03/08/2023   Primary osteoarthritis of right knee 02/21/2023   Lesion of vulva 10/06/2022   Lichen sclerosus et atrophicus 10/06/2022   S/P hysterectomy with oophorectomy 10/06/2022   Hot flashes 10/06/2022   Current use of estrogen therapy 10/06/2022   Anxiety 03/22/2022   Asthma 03/22/2022   Bipolar 1 disorder (HCC) 03/22/2022   Chronic pain 03/22/2022   Edema 03/22/2022   Gastro-esophageal reflux 03/22/2022   Hyperlipidemia 03/22/2022   Insomnia 03/22/2022   Menopausal symptom 03/22/2022   Sinusitis 03/22/2022   Type II diabetes mellitus (HCC) 03/22/2022   Osteoarthritis of left knee 03/15/2022   History of operative procedure on lumbosacral spinal structure 02/15/2022   Urge incontinence 09/02/2021   Cervical radiculopathy 12/30/2020   Pseudoarthrosis of lumbar spine 08/25/2020   Essential (primary) hypertension 06/24/2020   Body mass index (BMI) 45.0-49.9, adult (HCC) 05/06/2020   Carpal tunnel syndrome 04/01/2020   Spondylolisthesis, lumbar region 11/22/2019   Localized swelling of both lower legs 11/12/2019   Lumbar stenosis 11/05/2019   S/P left knee arthroscopy 06/27/2019 07/01/2019   Derangement of posterior horn of medial meniscus of left knee    Primary osteoarthritis of left knee    S/P right knee arthroscopy 05/09/19 05/29/2019   Depression 07/24/2014   LOWER LEG, ARTHRITIS, DEGEN./OSTEO 02/18/2009   DERANGEMENT MENISCUS 02/18/2009   ANSERINE BURSITIS, RIGHT 02/18/2009   REFERRING PROVIDER: Daris Merlynn BRAVO, NP   REFERRING DIAG: Low back pain, unspecified, Spondylosis without myelopathy or radiculopathy, lumbar region, History of lumbar spinal fusion   Rationale for Evaluation and Treatment: Rehabilitation  THERAPY DIAG:  Other low back pain  Pain in right hip  ONSET DATE: years  SUBJECTIVE:                                                                                                                                                                                            SUBJECTIVE STATEMENT: Pain at an 7 today.    PERTINENT HISTORY:  Hypertension, CHF, asthma, type 2 diabetes, osteoarthritis, depression, anxiety, bipolar 1 disorder, and  history of cancer  PAIN:  Are you having pain? Yes: NPRS scale: 7/10 Pain location: low back and right hip Pain description: constant, sharp pain, tingling Aggravating factors: bending, twisting Relieving factors: pain medication and laying down  PRECAUTIONS: None  RED FLAGS: None   WEIGHT BEARING RESTRICTIONS: No  FALLS:  Has patient fallen in last 6 months? No, but I can tell that my balance is off  LIVING ENVIRONMENT: Lives with: lives with their family Lives in: House/apartment Stairs: Yes: External: 3 steps; on left going up; step to pattern Has following equipment at home: None  OCCUPATION: disabled  PLOF: Independent  PATIENT GOALS: reduced pain  NEXT MD VISIT: none scheduled  OBJECTIVE:  Note: Objective measures were completed at Evaluation unless otherwise noted.  PATIENT SURVEYS:  Modified Oswestry 40% disability   COGNITION: Overall cognitive status: Within functional limits for tasks assessed     SENSATION: Patient reports no altered sensation.   POSTURE: rounded shoulders, forward head, and flexed trunk   PALPATION: TTP: left PSIS, right gluteals, latissimus dorsi, QL, and lumbar paraspinals  JOINT MOBILITY:  T10-L5: hypomobile and painful  LUMBAR ROM:   AROM eval  Flexion 24  Extension 10  Right lateral flexion 50% limited  Left lateral flexion 50% limited; pulls a little bit  Right rotation 50% limited; familiar pain  Left rotation 50% limited   (Blank rows = not tested)  LOWER EXTREMITY ROM: WFL for activities assessed  LOWER EXTREMITY MMT:    MMT Right eval Left eval  Hip flexion 3+/5 3+/5  Hip extension    Hip abduction    Hip adduction    Hip  internal rotation    Hip external rotation    Knee flexion 4+/5 4+/5  Knee extension 3/5 4-/5  Ankle dorsiflexion 3+/5 3+/5  Ankle plantarflexion    Ankle inversion    Ankle eversion     (Blank rows = not tested)  GAIT: Assistive device utilized: None Level of assistance: Complete Independence Comments: decreased gait speed and stride length  TREATMENT DATE:  01/15/24:  Nustep level 3 x 15 minutes f/b STW/M x 8 minutes to patient's lower lumbar region f/b   01/11/24:  Nustep level 3 x 15 minutes STW/M x 8 minutes to patient's bilateral lower lumbar region f/b IFC at 80-150 Hz on 40% scan x 20 minutes.                                     12/28/23 EXERCISE LOG  Exercise Repetitions and Resistance Comments  Nustep  L4 x 15 minutes    Manual therapy  See below                Blank cell = exercise not performed today  Manual Therapy Soft Tissue Mobilization: right lumbar paraspinals, QL, gluteals, and piriformis, for reduced pain and tone  Joint Mobilizations: L1-5, grade I-II CPA's  Modalities: no redness or adverse reaction to today's modalities   Date:  Unattended Estim: Lumbar, IFC @ 80-150 Hz w/ 40% scan, 15 mins, Pain and Tone  12/25/23:  Nustep level 3 x 15 minutes f/b STW/M x 8 minutes to patient's right lumbar musculature f/b IFC at 80-150 Hz on 40% scan x 20 minutes.  PATIENT EDUCATION:  Education details: POC  Person educated: Patient Education method: Explanation Education comprehension: verbalized understanding  HOME EXERCISE PROGRAM:   ASSESSMENT:  CLINICAL IMPRESSION: Treatment focused on lower lumbar region.  She enjoyed treatment and states she felt significantly better afterwards.    OBJECTIVE IMPAIRMENTS: Abnormal gait, decreased activity tolerance, decreased mobility, difficulty walking, decreased ROM, decreased  strength, hypomobility, impaired tone, postural dysfunction, and pain.   ACTIVITY LIMITATIONS: lifting, bending, sleeping, stairs, and locomotion level  PARTICIPATION LIMITATIONS: cleaning, community activity, and yard work  PERSONAL FACTORS: Past/current experiences, Time since onset of injury/illness/exacerbation, and 3+ comorbidities: Hypertension, CHF, asthma, type 2 diabetes, osteoarthritis, depression, anxiety, bipolar 1 disorder, and history of cancer are also affecting patient's functional outcome.   REHAB POTENTIAL: Fair    CLINICAL DECISION MAKING: Evolving/moderate complexity  EVALUATION COMPLEXITY: Moderate   GOALS: Goals reviewed with patient? Yes  SHORT TERM GOALS: Target date: 12/19/23  Patient will be independent with her initial HEP.  Baseline: Goal status: MET  2.  Patient will be able to complete her daily activities without her familiar pain exceeding 8/10. Baseline: 6/6: 9/10 with vacuuming Goal status: IN PROGRESS  3.  Patient will improve her ODI score to at least 30% disability or less for improved function with her daily activities. Baseline: 32% Goal status: IN PROGRESS  LONG TERM GOALS: Target date: 01/09/24  Patient will be independent with her advanced HEP.  Baseline:  Goal status: IN PROGRESS  2.  Patient will be able to complete her daily activities without her familiar pain exceeding 6/10. Baseline:  Goal status: IN PROGRESS  3.  Patient will improve her lumbar flexion to at least 35 degrees for improved function picking up items from the floor.  Baseline:  Goal status: IN PROGRESS  4.  Patient will improve her right quadriceps strength to at least 4-/5 for improved function with transfers.  Baseline:  Goal status: IN PROGRESS  PLAN:  PT FREQUENCY: 1-2x/week  PT DURATION: 6 weeks  PLANNED INTERVENTIONS: 97164- PT Re-evaluation, 97750- Physical Performance Testing, 97110-Therapeutic exercises, 97530- Therapeutic activity, [id number]-  Neuromuscular re-education, 97535- Self Care, 02859- Manual therapy, [phone]- Gait training, [phone]- Electrical stimulation (unattended), [phone]- Ultrasound, Patient/Family education, Balance training, Stair training, Dry Needling, Joint mobilization, Spinal mobilization, Cryotherapy, and Moist heat.  PLAN FOR NEXT SESSION: Nustep, isometrics, manual therapy, lower extremity strengthening, and modalities as needed.     Eliezer Khawaja, [place], PT 01/15/2024, 8:54 AM

## 2024-01-18 ENCOUNTER — Ambulatory Visit

## 2024-01-18 DIAGNOSIS — M5459 Other low back pain: Secondary | ICD-10-CM | POA: Diagnosis not present

## 2024-01-18 DIAGNOSIS — M25551 Pain in right hip: Secondary | ICD-10-CM

## 2024-01-18 NOTE — Therapy (Signed)
 OUTPATIENT PHYSICAL THERAPY THORACOLUMBAR TREATMENT   Patient Name: [name] MRN: [id number] DOB:Apr 30, 1961, 63 y.o., female Today's Date: 01/18/2024  END OF SESSION:  PT End of Session - 01/18/24 0800     Visit Number 12    Number of Visits 12    Date for PT Re-Evaluation 02/02/24    PT Start Time 0800    PT Stop Time 0847    PT Time Calculation (min) 47 min    Activity Tolerance Patient tolerated treatment well    Behavior During Therapy [facility] for tasks assessed/performed               Past Medical History:  Diagnosis Date   Anxiety    Arthritis    Asthma    Cancer (HCC)    cervical cancer; pt states a long long time ago   CHF (congestive heart failure) (HCC)    Coronary artery disease    Depression    Diabetes mellitus without complication (HCC)    Dyspnea    Edema of all four extremities    Essential (primary) hypertension 06/24/2020   Hyperlipemia    Type II diabetes mellitus (HCC) 03/22/2022   Past Surgical History:  Procedure Laterality Date   ABDOMINAL HYSTERECTOMY     ANTERIOR LAT LUMBAR FUSION N/A 11/05/2019   Procedure: Lumbar Four-Five Anterolateral lumbar interbody fusion with lateral plate;  Surgeon: Colon Shove, MD;  Location: [facility] OR;  Service: Neurosurgery;  Laterality: N/A;  anterolateral   BACK SURGERY     pins   BACK SURGERY  07/11/2008   BIOPSY  06/29/2021   Procedure: BIOPSY;  Surgeon: Eartha Angelia Sieving, MD;  Location: [facility];  Service: Gastroenterology;;   BLADDER REPAIR     CARPAL TUNNEL RELEASE Right    CESAREAN SECTION  1982, 1984   COLONOSCOPY N/A 06/12/2013   rehman: normal   COLONOSCOPY WITH PROPOFOL  N/A 06/27/2023   Procedure: COLONOSCOPY WITH PROPOFOL ;  Surgeon: Cinderella Deatrice FALCON, MD;  Location: [facility];  Service: Endoscopy;  Laterality: N/A;  2:00PM;ASA 3   CORONARY PRESSURE/FFR STUDY N/A 10/20/2022   Procedure: CORONARY PRESSURE/FFR STUDY;  Surgeon: Wendel Lurena POUR, MD;  Location: [facility] INV[facility]IVE  CV LAB;  Service: Cardiovascular;  Laterality: N/A;   CYSTOSCOPY WITH INJECTION N/A 03/23/2023   Procedure: CYSTOSCOPY WITH INJECTION-botox ;  Surgeon: Sherrilee Belvie CROME, MD;  Location: [facility] ORS;  Service: Urology;  Laterality: N/A;   ESOPHAGOGASTRODUODENOSCOPY (EGD) WITH PROPOFOL  N/A 06/29/2021   Procedure: ESOPHAGOGASTRODUODENOSCOPY (EGD) WITH PROPOFOL ;  Surgeon: Eartha Angelia Sieving, MD;  Location: [facility];  Service: Gastroenterology;  Laterality: N/A;  8:40   KNEE ARTHROSCOPY WITH MEDIAL MENISECTOMY Right 05/09/2019   Procedure: KNEE ARTHROSCOPY WITH MEDIAL MENISECTOMY AND LATERAL MENISECTOMY;  Surgeon: Margrette Taft BRAVO, MD;  Location: [facility] OR[facility];  Service: Orthopedics;  Laterality: Right;   KNEE ARTHROSCOPY WITH MEDIAL MENISECTOMY Left 06/27/2019   Procedure: KNEE ARTHROSCOPY WITH MEDIAL MENISCECTOMY;  Surgeon: Margrette Taft BRAVO, MD;  Location: [facility];  Service: Orthopedics;  Laterality: Left;   LEFT HEART CATH AND CORONARY ANGIOGRAPHY N/A 10/20/2022   Procedure: LEFT HEART CATH AND CORONARY ANGIOGRAPHY;  Surgeon: Wendel Lurena POUR, MD;  Location: [facility];  Service: Cardiovascular;  Laterality: N/A;   POLYPECTOMY  06/27/2023   Procedure: POLYPECTOMY;  Surgeon: Cinderella Deatrice FALCON, MD;  Location: [facility];  Service: Endoscopy;;   TOTAL KNEE ARTHROPLASTY Left 03/15/2022   Procedure: TOTAL KNEE ARTHROPLASTY;  Surgeon: Margrette Taft BRAVO, MD;  Location: [facility];  Service: Orthopedics;  Laterality: Left;   TOTAL KNEE ARTHROPLASTY Right 02/21/2023   Procedure: TOTAL KNEE ARTHROPLASTY;  Surgeon: Margrette Taft BRAVO, MD;  Location: [facility];  Service: Orthopedics;  Laterality: Right;   Patient Active Problem List   Diagnosis Date Noted   Acute left-sided low back pain without sciatica 10/24/2023   Adenomatous polyp of sigmoid colon 06/27/2023   Chronic congestive heart failure (HCC) 06/05/2023   Superficial fungus infection of skin 05/19/2023   Left breast abscess 05/19/2023    OAB (overactive bladder) 03/23/2023   S/P total knee replacement, right 02/21/23 03/08/2023   Primary osteoarthritis of right knee 02/21/2023   Lesion of vulva 10/06/2022   Lichen sclerosus et atrophicus 10/06/2022   S/P hysterectomy with oophorectomy 10/06/2022   Hot flashes 10/06/2022   Current use of estrogen therapy 10/06/2022   Anxiety 03/22/2022   Asthma 03/22/2022   Bipolar 1 disorder (HCC) 03/22/2022   Chronic pain 03/22/2022   Edema 03/22/2022   Gastro-esophageal reflux 03/22/2022   Hyperlipidemia 03/22/2022   Insomnia 03/22/2022   Menopausal symptom 03/22/2022   Sinusitis 03/22/2022   Type II diabetes mellitus (HCC) 03/22/2022   Osteoarthritis of left knee 03/15/2022   History of operative procedure on lumbosacral spinal structure 02/15/2022   Urge incontinence 09/02/2021   Cervical radiculopathy 12/30/2020   Pseudoarthrosis of lumbar spine 08/25/2020   Essential (primary) hypertension 06/24/2020   Body mass index (BMI) 45.0-49.9, adult (HCC) 05/06/2020   Carpal tunnel syndrome 04/01/2020   Spondylolisthesis, lumbar region 11/22/2019   Localized swelling of both lower legs 11/12/2019   Lumbar stenosis 11/05/2019   S/P left knee arthroscopy 06/27/2019 07/01/2019   Derangement of posterior horn of medial meniscus of left knee    Primary osteoarthritis of left knee    S/P right knee arthroscopy 05/09/19 05/29/2019   Depression 07/24/2014   LOWER LEG, ARTHRITIS, DEGEN./OSTEO 02/18/2009   DERANGEMENT MENISCUS 02/18/2009   ANSERINE BURSITIS, RIGHT 02/18/2009   REFERRING PROVIDER: Daris Merlynn BRAVO, NP   REFERRING DIAG: Low back pain, unspecified, Spondylosis without myelopathy or radiculopathy, lumbar region, History of lumbar spinal fusion   Rationale for Evaluation and Treatment: Rehabilitation  THERAPY DIAG:  Other low back pain  Pain in right hip  ONSET DATE: years  SUBJECTIVE:                                                                                                                                                                                            SUBJECTIVE STATEMENT: Patient reports that her pain is still about the same. She notes that nothing has seemed to help.   PERTINENT HISTORY:  Hypertension,  CHF, asthma, type 2 diabetes, osteoarthritis, depression, anxiety, bipolar 1 disorder, and history of cancer  PAIN:  Are you having pain? Yes: NPRS scale: 7/10 Pain location: low back and right hip Pain description: constant, sharp pain, tingling Aggravating factors: bending, twisting Relieving factors: pain medication and laying down  PRECAUTIONS: None  RED FLAGS: None   WEIGHT BEARING RESTRICTIONS: No  FALLS:  Has patient fallen in last 6 months? No, but I can tell that my balance is off  LIVING ENVIRONMENT: Lives with: lives with their family Lives in: House/apartment Stairs: Yes: External: 3 steps; on left going up; step to pattern Has following equipment at home: None  OCCUPATION: disabled  PLOF: Independent  PATIENT GOALS: reduced pain  NEXT MD VISIT: none scheduled  OBJECTIVE:  Note: Objective measures were completed at Evaluation unless otherwise noted.  PATIENT SURVEYS:  Modified Oswestry 40% disability   COGNITION: Overall cognitive status: Within functional limits for tasks assessed     SENSATION: Patient reports no altered sensation.   POSTURE: rounded shoulders, forward head, and flexed trunk   PALPATION: TTP: left PSIS, right gluteals, latissimus dorsi, QL, and lumbar paraspinals  JOINT MOBILITY:  T10-L5: hypomobile and painful  LUMBAR ROM:   AROM eval  Flexion 24  Extension 10  Right lateral flexion 50% limited  Left lateral flexion 50% limited; pulls a little bit  Right rotation 50% limited; familiar pain  Left rotation 50% limited   (Blank rows = not tested)  LOWER EXTREMITY ROM: WFL for activities assessed  LOWER EXTREMITY MMT:    MMT Right eval Left eval   Hip flexion 3+/5 3+/5  Hip extension    Hip abduction    Hip adduction    Hip internal rotation    Hip external rotation    Knee flexion 4+/5 4+/5  Knee extension 3/5 4-/5  Ankle dorsiflexion 3+/5 3+/5  Ankle plantarflexion    Ankle inversion    Ankle eversion     (Blank rows = not tested)  GAIT: Assistive device utilized: None Level of assistance: Complete Independence Comments: decreased gait speed and stride length  TREATMENT DATE:                                   01/18/24 EXERCISE LOG  Exercise Repetitions and Resistance Comments  Nustep  L5 x 17 minutes    Goal assessment See goal section                Blank cell = exercise not performed today  Modalities  Date:  Unattended Estim: Lumbar, IFC @ 80-150 Hz w/ 40% scan, 15 mins, Pain and Tone  01/15/24:  Nustep level 3 x 15 minutes f/b STW/M x 8 minutes to patient's lower lumbar region f/b   01/11/24:  Nustep level 3 x 15 minutes STW/M x 8 minutes to patient's bilateral lower lumbar region f/b IFC at 80-150 Hz on 40% scan x 20 minutes.    PATIENT EDUCATION:  Education details: POC  Person educated: Patient Education method: Explanation Education comprehension: verbalized understanding  HOME EXERCISE PROGRAM:   ASSESSMENT:  CLINICAL IMPRESSION: Patient has made minimal progress with skilled physical therapy as evidenced by her subjective reports, ODI score, objective measures, and progress toward her goals. She was able to meet her long term goals for improved lumbar flexion and quadriceps strength. However, she continues to experience high pain severity and irritability. She reported feeling a little  better upon the conclusion of treatment. She reported feeling comfortable being discharged with her HEP at this time.   PHYSICAL THERAPY DISCHARGE SUMMARY  Visits from Start of Care: 12  Current functional level related to goals / functional outcomes: Patient was able to partially meet her goals for skilled  physical therapy.    Remaining deficits: Pain    Education / Equipment: HEP   Patient agrees to discharge. Patient goals were partially met. Patient is being discharged due to maximized rehab potential.    OBJECTIVE IMPAIRMENTS: Abnormal gait, decreased activity tolerance, decreased mobility, difficulty walking, decreased ROM, decreased strength, hypomobility, impaired tone, postural dysfunction, and pain.   ACTIVITY LIMITATIONS: lifting, bending, sleeping, stairs, and locomotion level  PARTICIPATION LIMITATIONS: cleaning, community activity, and yard work  PERSONAL FACTORS: Past/current experiences, Time since onset of injury/illness/exacerbation, and 3+ comorbidities: Hypertension, CHF, asthma, type 2 diabetes, osteoarthritis, depression, anxiety, bipolar 1 disorder, and history of cancer are also affecting patient's functional outcome.   REHAB POTENTIAL: Fair    CLINICAL DECISION MAKING: Evolving/moderate complexity  EVALUATION COMPLEXITY: Moderate   GOALS: Goals reviewed with patient? Yes  SHORT TERM GOALS: Target date: 12/19/23  Patient will be independent with her initial HEP.  Baseline: Goal status: MET  2.  Patient will be able to complete her daily activities without her familiar pain exceeding 8/10. Baseline: 6/6: 9/10 with vacuuming 01/18/24: can get up to 10/10 Goal status: NOT MET  3.  Patient will improve her ODI score to at least 30% disability or less for improved function with her daily activities. Baseline: 42% on 01/18/24 Goal status: NOT MET  LONG TERM GOALS: Target date: 01/09/24  Patient will be independent with her advanced HEP.  Baseline:  Goal status: MET  2.  Patient will be able to complete her daily activities without her familiar pain exceeding 6/10. Baseline:  Goal status: NOT MET   3.  Patient will improve her lumbar flexion to at least 35 degrees for improved function picking up items from the floor.  Baseline: 52 degrees with pain on  01/18/24 Goal status: MET  4.  Patient will improve her right quadriceps strength to at least 4-/5 for improved function with transfers.  Baseline: 4-/5 on 01/18/24 Goal status: MET  PLAN:  PT FREQUENCY: 1-2x/week  PT DURATION: 6 weeks  PLANNED INTERVENTIONS: 97164- PT Re-evaluation, 97750- Physical Performance Testing, 97110-Therapeutic exercises, 97530- Therapeutic activity, 97112- Neuromuscular re-education, 97535- Self Care, 02859- Manual therapy, [phone]- Gait training, [phone]- Electrical stimulation (unattended), [phone]- Ultrasound, Patient/Family education, Balance training, Stair training, Dry Needling, Joint mobilization, Spinal mobilization, Cryotherapy, and Moist heat.  PLAN FOR NEXT SESSION: Nustep, isometrics, manual therapy, lower extremity strengthening, and modalities as needed.     Lacinda JAYSON Fass, PT 01/18/2024, 9:04 AM

## 2024-01-24 ENCOUNTER — Other Ambulatory Visit

## 2024-01-24 ENCOUNTER — Other Ambulatory Visit: Payer: Self-pay

## 2024-01-24 DIAGNOSIS — R399 Unspecified symptoms and signs involving the genitourinary system: Secondary | ICD-10-CM

## 2024-01-25 LAB — MICROSCOPIC EXAMINATION

## 2024-01-25 LAB — URINALYSIS, ROUTINE W REFLEX MICROSCOPIC
Bilirubin, UA: NEGATIVE
Ketones, UA: NEGATIVE
Nitrite, UA: NEGATIVE
Protein,UA: NEGATIVE
RBC, UA: NEGATIVE
Specific Gravity, UA: 1.015 (ref 1.005–1.030)
Urobilinogen, Ur: 0.2 mg/dL (ref 0.2–1.0)
pH, UA: 6 (ref 5.0–7.5)

## 2024-01-29 LAB — URINE CULTURE

## 2024-01-30 ENCOUNTER — Ambulatory Visit: Payer: Self-pay

## 2024-01-30 MED ORDER — SULFAMETHOXAZOLE-TRIMETHOPRIM 800-160 MG PO TABS: 1.0000 | ORAL_TABLET | Freq: Two times a day (BID) | ORAL | 0 refills | Status: DC

## 2024-01-30 NOTE — Telephone Encounter (Signed)
 Patient called and made aware of positive urine culture and abt sent to pharmacy. Botox  rescheduled for next week.

## 2024-01-30 NOTE — Telephone Encounter (Signed)
-----   Message from Belvie Clara sent at 01/30/2024  8:31 AM EDT ----- Bactridm DS BID for 7 days ----- Message ----- From: Rebecka Memos Lab Results In Sent: 01/29/2024  10:35 AM EDT To: Belvie LITTIE Clara, MD

## 2024-01-31 ENCOUNTER — Other Ambulatory Visit: Admitting: Urology

## 2024-02-05 ENCOUNTER — Encounter: Payer: Self-pay | Admitting: Urology

## 2024-02-05 ENCOUNTER — Ambulatory Visit (INDEPENDENT_AMBULATORY_CARE_PROVIDER_SITE_OTHER): Admitting: Urology

## 2024-02-05 VITALS — BP 124/73 | HR 88

## 2024-02-05 DIAGNOSIS — N3281 Overactive bladder: Secondary | ICD-10-CM

## 2024-02-05 MED ORDER — ONABOTULINUMTOXINA 100 UNITS IJ SOLR
200.0000 [IU] | Freq: Once | INTRAMUSCULAR | Status: AC
Start: 2024-02-05 — End: 2024-02-05
  Administered 2024-02-05: 200 [IU] via INTRAMUSCULAR

## 2024-02-05 MED ORDER — LIDOCAINE HCL 1 % IJ SOLN
20.0000 mL | Freq: Once | INTRAMUSCULAR | Status: AC
  Administered 2024-02-05: 20 mL

## 2024-02-05 MED ORDER — CIPROFLOXACIN HCL 500 MG PO TABS
500.0000 mg | ORAL_TABLET | Freq: Once | ORAL | Status: AC
  Administered 2024-02-05: 500 mg via ORAL

## 2024-02-05 NOTE — Progress Notes (Unsigned)
 Intraservice. Urojet was instilled using sterile technique into the urethra without difficulty.  Anesthesia: 1% plain lidocaine  50 cc instilled via careful sterile placement of a foley catheter. The bladder was allowed to achieve anesthesia via gently rolling the patient side to side for 15 minutes. The catheter was removed after the instillation. TIME ELEMENT = 15 MINUTES  Prep: BOTOX (R) (Allergan, [place], [place], 100 Units per vial) was reconstituted gently with 10 mL of 0.9% sterile, nonpreserved saline solution and drawn into a 10-mL syringe. An additional 1 mL of sterile, nonpreserved saline solution was drawn into a separate syringe for the final injection so that the remaining BOTOX (R) in the needle was delivered to the bladder. TIME ELEMENT = 15 MINUTES  Procedure: A rigid cystoscope was gently inserted into the urinary bladder via the urethra. The trigone was identified and evaluated. Next we identified 20 template sites within the urinary bladder. The bladder is only instilled with 100 cc volume to ensure adequate muscle wall thickness to prevent needle penetration beyond the muscle wall. Careful injections were undertaken for a total of 20 injections using a Laborie depth-limiting needle [employer] [place], [place]). 0.5 cc volume was delivered at each site carefully into the muscle without excessive bleb formation submucosally. Targeted zones were injected over TIME ELEMENT =15 MINUTES.  Postservice: The patient was monitored in recovery for 30 minutes. Patient voided successfully. Clean intermittent catheterization training was reviewed again for the patient. Respiratory and cardiac status were confirmed for stability. Mentation is assessed to be adequate for discharge with alteration in status. TIME ELEMENT = 30 MINUTES. TOTAL PROCEDURE TIME: 1 HR AND 10 MINUTES  Qty: 200 Unit: kit Route: Intra-vesically Freq: None Site: None  Mfgr: Allergan  Patient was instructed  to call the office immediately for bloody urine, difficulty urinating, urinary retention, painful or frequent urination, fever, chills, nausea, vomiting, or other illness. The patient stated that she understood these instructions and would comply with them. Patient was provided prophylactic antibiotics (except aminoglycosides) for 1 to 3 days postprocedure.

## 2024-02-05 NOTE — Progress Notes (Signed)
 Bladder Instillation for Botox  procedure  Due to overactive bladder patient is present today for Botox . A Bladder Instillation of 1 % lidocaine  was given. Patient was cleaned and prepped in a sterile fashion [id number].  A 14FR catheter was inserted, urine return was noted 30ml, urine was  Clear yellow in color.  50 ml was instilled into the bladder. The catheter was then removed. Patient tolerated well, no complications were noted. Patient held in bladder for 30 minutes prior to procedure starting.  MD was notified of bladder instillation time.  Performed by: Veleria, CMA

## 2024-02-06 LAB — URINALYSIS, ROUTINE W REFLEX MICROSCOPIC
Bilirubin, UA: NEGATIVE
Ketones, UA: NEGATIVE
Nitrite, UA: NEGATIVE
Protein,UA: NEGATIVE
RBC, UA: NEGATIVE
Specific Gravity, UA: 1.015 (ref 1.005–1.030)
Urobilinogen, Ur: 1 mg/dL (ref 0.2–1.0)
pH, UA: 6.5 (ref 5.0–7.5)

## 2024-02-06 LAB — MICROSCOPIC EXAMINATION

## 2024-02-08 ENCOUNTER — Encounter: Payer: Self-pay | Admitting: Urology

## 2024-02-08 ENCOUNTER — Ambulatory Visit (INDEPENDENT_AMBULATORY_CARE_PROVIDER_SITE_OTHER): Admitting: Urology

## 2024-02-08 VITALS — BP 125/77 | HR 79

## 2024-02-08 DIAGNOSIS — Z09 Encounter for follow-up examination after completed treatment for conditions other than malignant neoplasm: Secondary | ICD-10-CM

## 2024-02-08 DIAGNOSIS — N3281 Overactive bladder: Secondary | ICD-10-CM

## 2024-02-08 DIAGNOSIS — N3941 Urge incontinence: Secondary | ICD-10-CM

## 2024-02-08 DIAGNOSIS — R109 Unspecified abdominal pain: Secondary | ICD-10-CM

## 2024-02-08 DIAGNOSIS — L900 Lichen sclerosus et atrophicus: Secondary | ICD-10-CM

## 2024-02-08 LAB — BLADDER SCAN AMB NON-IMAGING: Scan Result: 0

## 2024-02-08 NOTE — Progress Notes (Unsigned)
 Name: [name] DOB: Nov 10, 1960 MRN: [id number]  History of Present Illness: [name] is a 63 y.o. female who presents today for follow up visit at [facility].  Relevant History includes: 1. Stress urinary incontinence (resolved s/p MUS). 2. OAB with urinary frequency, nocturia, urgency, and uge incontinence. - Neurogenic risk factors: T2DM and spinal stenosis / prior back surgeries. - Exacerbating factors: Diuretic use and caffeine intake. - Failed Oxybutynin, Vesicare , and Myrbetriq .  - Improved with bladder Botox  injection (100 units). 3. Recurrent UTIs. 4. Vaginal atrophy. Uses topical vaginal estrogen cream.  5. Lichen sclerosus. 6. 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 Per Dr. Annis note on 09/30/2021.  Urine culture results in past 12 months: - 09/07/2023: Positive for Proteus mirabilis - 11/08/2023: Positive for E. coli - 01/03/2024: Positive for E. Coli - 01/24/2024: Positive for Proteus mirabilis  At last visit with Dr. Sherrilee on 02/05/2024: - Underwent cystoscopy with bladder Botox  (100 units) procedure. - Patient was provided prophylactic antibiotic postprocedure (Bactrim  DS 2x/day for 7 days).  Today: She reports that since the day after her Botox  procedure she has been having severe right low back / flank pain. Denies significant abdominal pain but states she feels occasional mild RLQ pain. She denies fevers, nausea, or vomiting.  She denies urinary urgency, frequency, dysuria, gross hematuria, hesitancy, straining to void, or sensations of incomplete emptying. Reports strong urinary stream.    Medications: Current Outpatient Medications  Medication Sig Dispense Refill   acetaminophen  (TYLENOL ) 500 MG tablet Take 500 mg by mouth every 6 (six) hours as needed for moderate pain (pain score 4-6).     albuterol  (VENTOLIN  HFA) 108 (90 Base) MCG/ACT inhaler Inhale 1-2 puffs into the lungs every 6 (six) hours as needed for wheezing  or shortness of breath.     ARIPiprazole  (ABILIFY ) 2 MG tablet Take 2 mg by mouth daily.     aspirin  81 MG chewable tablet Chew 1 tablet (81 mg total) by mouth 2 (two) times daily. 90 tablet 0   atorvastatin  (LIPITOR) 80 MG tablet Take 0.5 tablets (40 mg total) by mouth at bedtime. 90 tablet 3   celecoxib  (CELEBREX ) 100 MG capsule Take 100 mg by mouth 2 (two) times daily.     Empagliflozin -metFORMIN  HCl ER (SYNJARDY  XR) 12.11-998 MG TB24 Take 1 tablet by mouth daily.     estradiol  (ESTRACE ) 0.1 MG/GM vaginal cream Discard plastic applicator. Insert a blueberry size amount (approximately 1 gram) of cream on fingertip inside vagina at bedtime 2-3 nights per week. For long term use. 30 g 3   estradiol  (ESTRACE ) 2 MG tablet TAKE ONE TABLET AT BEDTIME 30 tablet 11   FLUoxetine  (PROZAC ) 40 MG capsule Take 40 mg by mouth daily.     gabapentin  (NEURONTIN ) 300 MG capsule Take 1 capsule (300 mg total) by mouth 3 (three) times daily. 21 capsule 3   loratadine  (CLARITIN ) 10 MG tablet Take 10 mg by mouth daily.     nitrofurantoin , macrocrystal-monohydrate, (MACROBID ) 100 MG capsule Take 1 capsule (100 mg total) by mouth every 12 (twelve) hours. 14 capsule 0   nitroGLYCERIN  (NITROSTAT ) 0.4 MG SL tablet Place 1 tablet (0.4 mg total) under the tongue every 5 (five) minutes as needed for chest pain. 25 tablet 3   oxyCODONE -acetaminophen  (PERCOCET) 7.5-325 MG tablet Take 1 tablet by mouth 4 (four) times daily.     potassium chloride  SA (KLOR-CON  M) 20 MEQ tablet Take 1 tablet (20 mEq total) by mouth daily. 90 tablet 3  pseudoephedrine-guaifenesin  (MUCINEX  D) 60-600 MG 12 hr tablet Take 1 tablet by mouth every 12 (twelve) hours.     spironolactone  (ALDACTONE ) 25 MG tablet Take 0.5 tablets (12.5 mg total) by mouth daily. 90 tablet 3   sulfamethoxazole -trimethoprim  (BACTRIM  DS) 800-160 MG tablet Take 1 tablet by mouth 2 (two) times daily. 14 tablet 0   torsemide  (DEMADEX ) 20 MG tablet Take 2 tablets (40 mg total) by  mouth daily. 90 tablet 3   traMADol  (ULTRAM ) 50 MG tablet Take 1 tablet (50 mg total) by mouth every 6 (six) hours as needed. 5 tablet 0   traZODone  (DESYREL ) 100 MG tablet Take 200 mg by mouth at bedtime.     No current facility-administered medications for this visit.    Allergies: No Known Allergies  Past Medical History:  Diagnosis Date   Anxiety    Arthritis    Asthma    Cancer (HCC)    cervical cancer; pt states a long long time ago   CHF (congestive heart failure) (HCC)    Coronary artery disease    Depression    Diabetes mellitus without complication (HCC)    Dyspnea    Edema of all four extremities    Essential (primary) hypertension 06/24/2020   Hyperlipemia    Type II diabetes mellitus (HCC) 03/22/2022   Past Surgical History:  Procedure Laterality Date   ABDOMINAL HYSTERECTOMY     ANTERIOR LAT LUMBAR FUSION N/A 11/05/2019   Procedure: Lumbar Four-Five Anterolateral lumbar interbody fusion with lateral plate;  Surgeon: Colon Shove, MD;  Location: [facility];  Service: Neurosurgery;  Laterality: N/A;  anterolateral   BACK SURGERY     pins   BACK SURGERY  07/11/2008   BIOPSY  06/29/2021   Procedure: BIOPSY;  Surgeon: Eartha Angelia Sieving, MD;  Location: [facility];  Service: Gastroenterology;;   BLADDER REPAIR     CARPAL TUNNEL RELEASE Right    CESAREAN SECTION  1982, 1984   COLONOSCOPY N/A 06/12/2013   rehman: normal   COLONOSCOPY WITH PROPOFOL  N/A 06/27/2023   Procedure: COLONOSCOPY WITH PROPOFOL ;  Surgeon: Cinderella Deatrice FALCON, MD;  Location: [facility];  Service: Endoscopy;  Laterality: N/A;  2:00PM;ASA 3   CORONARY PRESSURE/FFR STUDY N/A 10/20/2022   Procedure: CORONARY PRESSURE/FFR STUDY;  Surgeon: Wendel Lurena POUR, MD;  Location: [facility];  Service: Cardiovascular;  Laterality: N/A;   CYSTOSCOPY WITH INJECTION N/A 03/23/2023   Procedure: CYSTOSCOPY WITH INJECTION-botox ;  Surgeon: Sherrilee Belvie CROME, MD;  Location: [facility];  Service: Urology;   Laterality: N/A;   ESOPHAGOGASTRODUODENOSCOPY (EGD) WITH PROPOFOL  N/A 06/29/2021   Procedure: ESOPHAGOGASTRODUODENOSCOPY (EGD) WITH PROPOFOL ;  Surgeon: Eartha Angelia Sieving, MD;  Location: [facility];  Service: Gastroenterology;  Laterality: N/A;  8:40   KNEE ARTHROSCOPY WITH MEDIAL MENISECTOMY Right 05/09/2019   Procedure: KNEE ARTHROSCOPY WITH MEDIAL MENISECTOMY AND LATERAL MENISECTOMY;  Surgeon: Margrette Taft BRAVO, MD;  Location: [facility];  Service: Orthopedics;  Laterality: Right;   KNEE ARTHROSCOPY WITH MEDIAL MENISECTOMY Left 06/27/2019   Procedure: KNEE ARTHROSCOPY WITH MEDIAL MENISCECTOMY;  Surgeon: Margrette Taft BRAVO, MD;  Location: [facility];  Service: Orthopedics;  Laterality: Left;   LEFT HEART CATH AND CORONARY ANGIOGRAPHY N/A 10/20/2022   Procedure: LEFT HEART CATH AND CORONARY ANGIOGRAPHY;  Surgeon: Wendel Lurena POUR, MD;  Location: [facility];  Service: Cardiovascular;  Laterality: N/A;   POLYPECTOMY  06/27/2023   Procedure: POLYPECTOMY;  Surgeon: Cinderella Deatrice FALCON, MD;  Location: [facility];  Service:  Endoscopy;;   TOTAL KNEE ARTHROPLASTY Left 03/15/2022   Procedure: TOTAL KNEE ARTHROPLASTY;  Surgeon: Margrette Taft BRAVO, MD;  Location: [facility];  Service: Orthopedics;  Laterality: Left;   TOTAL KNEE ARTHROPLASTY Right 02/21/2023   Procedure: TOTAL KNEE ARTHROPLASTY;  Surgeon: Margrette Taft BRAVO, MD;  Location: [facility];  Service: Orthopedics;  Laterality: Right;   Family History  Problem Relation Age of Onset   Cancer Paternal Grandfather    Cancer Paternal Grandmother    Cancer Maternal Grandmother    Heart disease Father    Lung cancer Mother    Other Brother        gunshot   Colon cancer Other    Social History   Socioeconomic History   Marital status: Single    Spouse name: Not on file   Number of children: 2   Years of education: Not on file   Highest education level: Not on file  Occupational History   Occupation: Disabled  Tobacco Use   Smoking  status: Former    Current packs/day: 1.00    Average packs/day: 1 pack/day for 20.0 years (20.0 ttl pk-yrs)    Types: Cigarettes   Smokeless tobacco: Never   Tobacco comments:    trying to quit  Vaping Use   Vaping status: Former  Substance and Sexual Activity   Alcohol use: No    Alcohol/week: 0.0 standard drinks of alcohol   Drug use: No   Sexual activity: Not Currently    Birth control/protection: Surgical    Comment: hyst  Other Topics Concern   Not on file  Social History Narrative   ** Merged History Encounter **       Social Drivers of Health   Financial Resource Strain: Low Risk  (10/06/2022)   Overall Financial Resource Strain (CARDIA)    Difficulty of Paying Living Expenses: Not very hard  Food Insecurity: No Food Insecurity (02/08/2024)   Received [facility]   Hunger Vital Sign    Within the past 12 months, you worried that your food would run out before you got the money to buy more.: Never true    Within the past 12 months, the food you bought just didn't last and you didn't have money to get more.: Never true  Transportation Needs: No Transportation Needs (02/08/2024)   Received from [facility]   PRAPARE - Transportation    Lack of Transportation (Medical): No    Lack of Transportation (Non-Medical): No  Physical Activity: Inactive (10/06/2022)   Exercise Vital Sign    Days of Exercise per Week: 0 days    Minutes of Exercise per Session: 0 min  Stress: No Stress Concern Present (10/06/2022)   [employer] of Occupational Health - Occupational Stress Questionnaire    Feeling of Stress : Not at all  Social Connections: Socially Isolated (10/06/2022)   Social Connection and Isolation Panel    Frequency of Communication with Friends and Family: Once a week    Frequency of Social Gatherings with Friends and Family: Once a week    Attends Religious Services: Never    Database administrator or Organizations: No    Attends Banker  Meetings: Never    Marital Status: Divorced  Catering manager Violence: Not At Risk (02/21/2023)   Humiliation, Afraid, Rape, and Kick questionnaire    Fear of Current or Ex-Partner: No    Emotionally Abused: No    Physically Abused: No    Sexually Abused: No  Review of Systems Constitutional: Patient denies any unintentional weight loss or change in strength lntegumentary: Patient denies any rashes or pruritus Cardiovascular: Patient denies chest pain or syncope Respiratory: Patient denies shortness of breath Gastrointestinal: Patient denies constipation Musculoskeletal: Patient denies muscle cramps or weakness Neurologic: Patient denies convulsions or seizures Allergic/Immunologic: Patient denies recent allergic reaction(s) Hematologic/Lymphatic: Patient denies bleeding tendencies Endocrine: Patient denies heat/cold intolerance  GU: As per HPI.  OBJECTIVE Vitals:   02/08/24 1610  BP: 125/77  Pulse: 79   There is no height or weight on file to calculate BMI.  Physical Examination Constitutional: No obvious distress; patient is non-toxic appearing  Cardiovascular: No visible lower extremity edema.  Respiratory: The patient does not have audible wheezing/stridor; respirations do not appear labored  Gastrointestinal: Abdomen non-distended Musculoskeletal: Normal ROM of UEs  Skin: No obvious rashes/open sores  Neurologic: CN 2-12 grossly intact Psychiatric: Answered questions appropriately with normal affect  Hematologic/Lymphatic/Immunologic: No obvious bruises or sites of spontaneous bleeding  UA/microscopy: 6-10 WBC/hpf, 0-2 RBC/hpf, few bacteria, glucosuria (secondary to [place] use) PVR: 0 ml  ASSESSMENT Flank pain - Plan: Urinalysis, Routine w reflex microscopic, DG Abd 1 View, US  RENAL, Urine Culture  Postop check - Plan: Urine Culture  OAB (overactive bladder) - Plan: Urine Culture  Urge incontinence - Plan: BLADDER SCAN AMB NON-IMAGING, Urine  Culture  Lichen sclerosus et atrophicus - Plan: Urine Culture  No acute findings - no evidence of incomplete bladder emptying. Mildly abnormal UA with no UTI symptoms; Will check urine culture and treat as indicated based on results. Discussed possible GU stone and agreed to proceed with KUB & RUS. Other possible causes may include a musculoskeletal etiology. Patient verbalized understanding of and agreement with current plan. All questions were answered.  PLAN Advised the following: 1. RUS & KUB. 2. Urine culture. 3. Return in 3 months (on 05/08/2024) for as previously scheduled.  Orders Placed This Encounter  Procedures   Urine Culture   DG Abd 1 View    Standing Status:   Future    Expected Date:   02/08/2024    Expiration Date:   02/07/2025    Reason for Exam (SYMPTOM  OR DIAGNOSIS REQUIRED):   kidney stone    Preferred imaging location?:   [facility]   US  RENAL    Standing Status:   Future    Expected Date:   02/08/2024    Expiration Date:   02/07/2025    Reason for Exam (SYMPTOM  OR DIAGNOSIS REQUIRED):   kidney stone known or suspected    Preferred imaging location?:   [facility]   Urinalysis, Routine w reflex microscopic   BLADDER SCAN AMB NON-IMAGING    It has been explained that the patient is to follow regularly with their PCP in addition to all other providers involved in their care and to follow instructions provided by these respective offices. Patient advised to contact urology clinic if any urologic-pertaining questions, concerns, new symptoms or problems arise in the interim period.  There are no Patient Instructions on file for this visit.  Electronically signed by:  Lauraine JAYSON Oz, FNP   02/08/24    4:52 PM

## 2024-02-09 ENCOUNTER — Encounter: Payer: Self-pay | Admitting: Physician Assistant

## 2024-02-09 ENCOUNTER — Ambulatory Visit: Attending: Physician Assistant | Admitting: Physician Assistant

## 2024-02-09 VITALS — BP 120/77 | HR 68 | Ht 64.0 in | Wt 236.8 lb

## 2024-02-09 DIAGNOSIS — I10 Essential (primary) hypertension: Secondary | ICD-10-CM | POA: Diagnosis not present

## 2024-02-09 DIAGNOSIS — I5032 Chronic diastolic (congestive) heart failure: Secondary | ICD-10-CM

## 2024-02-09 DIAGNOSIS — I2510 Atherosclerotic heart disease of native coronary artery without angina pectoris: Secondary | ICD-10-CM | POA: Diagnosis not present

## 2024-02-09 DIAGNOSIS — E782 Mixed hyperlipidemia: Secondary | ICD-10-CM | POA: Diagnosis not present

## 2024-02-09 DIAGNOSIS — Z79899 Other long term (current) drug therapy: Secondary | ICD-10-CM

## 2024-02-09 DIAGNOSIS — E119 Type 2 diabetes mellitus without complications: Secondary | ICD-10-CM

## 2024-02-09 LAB — BASIC METABOLIC PANEL WITH GFR
BUN/Creatinine Ratio: 12 (ref 12–28)
BUN: 10 mg/dL (ref 8–27)
CO2: 19 mmol/L — ABNORMAL LOW (ref 20–29)
Calcium: 9.5 mg/dL (ref 8.7–10.3)
Chloride: 102 mmol/L (ref 96–106)
Creatinine, Ser: 0.86 mg/dL (ref 0.57–1.00)
Glucose: 81 mg/dL (ref 70–99)
Potassium: 4.8 mmol/L (ref 3.5–5.2)
Sodium: 137 mmol/L (ref 134–144)
eGFR: 77 mL/min/1.73 (ref >59)

## 2024-02-09 LAB — URINALYSIS, ROUTINE W REFLEX MICROSCOPIC
Bilirubin, UA: NEGATIVE
Ketones, UA: NEGATIVE
Nitrite, UA: NEGATIVE
Protein,UA: NEGATIVE
RBC, UA: NEGATIVE
Specific Gravity, UA: 1.015 (ref 1.005–1.030)
Urobilinogen, Ur: 0.2 mg/dL (ref 0.2–1.0)
pH, UA: 7.5 (ref 5.0–7.5)

## 2024-02-09 LAB — MICROSCOPIC EXAMINATION

## 2024-02-09 NOTE — Patient Instructions (Signed)
 Medication Instructions:  TAKE TORSEMIDE  AND POTASSIUM TWICE A DAY FOR THE NEXT 5 DAYS THEN RESUME NORMAL DOSAGE *If you need a refill on your cardiac medications before your next appointment, please call your pharmacy*  Lab Work: BMET TODAY If you have labs (blood work) drawn today and your tests are completely normal, you will receive your results only by: MyChart Message (if you have MyChart) OR A paper copy in the mail If you have any lab test that is abnormal or we need to change your treatment, we will call you to review the results.  Testing/Procedures: NO TESTING  Follow-Up: At [facility], you and your health needs are our priority.  As part of our continuing mission to provide you with exceptional heart care, our providers are all part of one team.  This team includes your primary Cardiologist (physician) and Advanced Practice Providers or APPs (Physician Assistants and Nurse Practitioners) who all work together to provide you with the care you need, when you need it.  Your next appointment:   3-4 month(s)  Provider:   Darryle ONEIDA Decent, MD   Other Instructions IF WEIGHT INCREASE 3LBS OVERNIGHT OR 5LBS IN ONE WEEK GIVE OFFICE A CALL.  IF BREATHING WORSENS FOLLOW UP WITH PCP

## 2024-02-09 NOTE — Progress Notes (Signed)
 Cardiology Office Note   Date:  02/09/2024  ID:  [name], DOB 11-12-1960, MRN [id number] PCP: Renato Dorothey HERO, NP  [facility] Providers Cardiologist:  Darryle ONEIDA Decent, MD     History of Present Illness [name] is a 63 y.o. female with past medical history of nonobstructive CAD, tobacco abuse, HFpEF, HTN, HLD, and DM II.  Previous cardiac catheterization performed on 10/20/2022 showed nonobstructive CAD, calcium  score of 800 which placed the patient at 99th percentile for age and sex matched control.  LVEDP was 32 mmHg.  Echocardiogram obtained at the time showed EF 60 to 65%, grade 1 DD, normal RV, trivial MR, trivial AI.  She was found to be volume overloaded during office visit in January 2025, Lasix  was switched to 40 mg torsemide .  She was 232 pounds during office visit in February she was last seen by Dr. Decent on 12/06/2023, weight was 241 pounds at the time.  She was felt to be volume overloaded again, she was instructed to take torsemide  40 mg twice a day for 5 days then 40 mg daily thereafter.  Patient presents today for follow-up, her weight is 236 pounds.  She continue to have shortness of breath [place]  She has trace amount of mild edema in the lower extremity.  I will obtain basic metabolic panel today.  I did instruct her to take another 5-day course of extra dose of torsemide  and potassium before going back to the daily dosing.  In the future, if her weight increased by more than 3 pounds overnight or 5 pounds in single week, she has not been instructed to call our office and to take 2 days of extra dose of torsemide  and potassium at home.  She does have expiratory wheezing on physical exam, she has a history of asthma.  She is on rescue inhaler albuterol , however does not have many maintenance therapy.  I suspect some of her shortness of breath may be related to her asthma.  If her symptom worsens, she will need to speak  with her PCP to potentially refer to pulmonology service for maintenance therapy.  Otherwise, she can follow-up with Dr. Decent in 3 to 4 months.  ROS:   She continued to have shortness of breath with exertion.  She denies any chest pain.  She has trace lower extremity edema, but no orthopnea or PND.  Studies Reviewed      Cardiac Studies & Procedures   ______________________________________________________________________________________________ CARDIAC CATHETERIZATION  CARDIAC CATHETERIZATION 10/20/2022  Conclusion   Mid LAD lesion is 40% stenosed.   1st Mrg lesion is 25% stenosed.  1.  Mild obstructive coronary artery disease with RFR of 1.0 in the LAD and RFR of 0.97 in the left circumflex; RFR of the right coronary artery was not pursued given patient intolerance of adenosine  for coronary microvascular dysfunction assessment (see procedural notes). 2.  Index of microvascular resistance of 13 with normal being less than 25 and coronary flow reserve of 1.9 with normal being greater than 2.0. 3.  Severely elevated LVEDP of 32 mmHg.  Recommendation: Diuretics will be initiated on discharge.  Findings Coronary Findings Diagnostic  Dominance: Right  Left Anterior Descending Mid LAD lesion is 40% stenosed.  Left Circumflex  First Obtuse Marginal Branch 1st Mrg lesion is 25% stenosed.  Right Coronary Artery Vessel is moderate in size. There is mild diffuse disease throughout the vessel.  Intervention  No interventions have been documented.  ECHOCARDIOGRAM  ECHOCARDIOGRAM COMPLETE 09/15/2023  Narrative ECHOCARDIOGRAM REPORT    Patient Name:   [name] Date of Exam: 09/15/2023 Medical Rec #:  [id number]         Height:       64.0 in Accession #:    [id number]        Weight:       232.4 lb Date of Birth:  12/06/1960        BSA:          2.085 m Patient Age:    62 years          BP:           118/66 mmHg Patient Gender: F                 HR:           82  bpm. Exam Location:  [address]  Procedure: 2D Echo, Cardiac Doppler, Color Doppler, 3D Echo and Strain Analysis (Both Spectral and Color Flow Doppler were utilized during procedure).  Indications:    I50.31 CHF  History:        Patient has prior history of Echocardiogram examinations, most recent 11/03/2022. CHF, CAD; Risk Factors:Hypertension, Diabetes, Dyslipidemia, Current Smoker and Obesity.  Sonographer:    Elsie Bohr RDCS Referring Phys: [id number] KATHRYN M LAWRENCE  IMPRESSIONS   1. Left ventricular ejection fraction, by estimation, is 55 to 60%. Left ventricular ejection fraction by 3D volume is 55 %. The left ventricle has normal function. The left ventricle has no regional wall motion abnormalities. Left ventricular diastolic parameters were normal. The average left ventricular global longitudinal strain is -24.0 %. The global longitudinal strain is normal. 2. Right ventricular systolic function is normal. The right ventricular size is mildly enlarged. There is normal pulmonary artery systolic pressure. The estimated right ventricular systolic pressure is 24.2 mmHg. 3. Left atrial size was mildly dilated. 4. The mitral valve is normal in structure. Trivial mitral valve regurgitation. No evidence of mitral stenosis. 5. The aortic valve is tricuspid. Aortic valve regurgitation is mild. Aortic valve sclerosis/calcification is present, without any evidence of aortic stenosis. 6. The inferior vena cava is normal in size with greater than 50% respiratory variability, suggesting right atrial pressure of 3 mmHg.  FINDINGS Left Ventricle: Left ventricular ejection fraction, by estimation, is 55 to 60%. Left ventricular ejection fraction by 3D volume is 55 %. The left ventricle has normal function. The left ventricle has no regional wall motion abnormalities. The average left ventricular global longitudinal strain is -24.0 %. Strain was performed and the global longitudinal strain is  normal. The left ventricular internal cavity size was normal in size. There is no left ventricular hypertrophy. Left ventricular diastolic parameters were normal. Normal left ventricular filling pressure.  Right Ventricle: The right ventricular size is mildly enlarged. No increase in right ventricular wall thickness. Right ventricular systolic function is normal. There is normal pulmonary artery systolic pressure. The tricuspid regurgitant velocity is 2.30 m/s, and with an assumed right atrial pressure of 3 mmHg, the estimated right ventricular systolic pressure is 24.2 mmHg.  Left Atrium: Left atrial size was mildly dilated.  Right Atrium: Right atrial size was normal in size.  Pericardium: There is no evidence of pericardial effusion.  Mitral Valve: The mitral valve is normal in structure. Mild to moderate mitral annular calcification. Trivial mitral valve regurgitation. No evidence of mitral valve stenosis.  Tricuspid Valve: The tricuspid valve is normal in structure. Tricuspid valve  regurgitation is trivial. No evidence of tricuspid stenosis.  Aortic Valve: The aortic valve is tricuspid. Aortic valve regurgitation is mild. Aortic regurgitation PHT measures 347 msec. Aortic valve sclerosis/calcification is present, without any evidence of aortic stenosis.  Pulmonic Valve: The pulmonic valve was normal in structure. Pulmonic valve regurgitation is not visualized. No evidence of pulmonic stenosis.  Aorta: The aortic root is normal in size and structure.  Venous: The inferior vena cava is normal in size with greater than 50% respiratory variability, suggesting right atrial pressure of 3 mmHg.  IAS/Shunts: No atrial level shunt detected by color flow Doppler.  Additional Comments: 3D was performed not requiring image post processing on an independent workstation and was normal.   LEFT VENTRICLE PLAX 2D LVIDd:         4.60 cm         Diastology LVIDs:         3.20 cm         LV e'  medial:    13.90 cm/s LV PW:         0.90 cm         LV E/e' medial:  8.5 LV IVS:        1.10 cm         LV e' lateral:   12.90 cm/s LVOT diam:     1.90 cm         LV E/e' lateral: 9.1 LV SV:         75 LV SV Index:   36              2D Longitudinal LVOT Area:     2.84 cm        Strain 2D Strain GLS   -24.8 % (A4C): 2D Strain GLS   -24.6 % (A3C): 2D Strain GLS   -22.5 % (A2C): 2D Strain GLS   -24.0 % Avg:  3D Volume EF LV 3D EF:    Left ventricul ar ejection fraction by 3D volume is 55 %.  3D Volume EF: 3D EF:        55 % LV EDV:       160 ml LV ESV:       72 ml LV SV:        89 ml  RIGHT VENTRICLE             IVC RV S prime:     17.30 cm/s  IVC diam: 0.80 cm TAPSE (M-mode): 2.2 cm RVSP:           24.2 mmHg  LEFT ATRIUM             Index        RIGHT ATRIUM           Index LA diam:        3.80 cm 1.82 cm/m   RA Pressure: 3.00 mmHg LA Vol (A2C):   77.8 ml 37.31 ml/m  RA Area:     15.00 cm LA Vol (A4C):   65.8 ml 31.55 ml/m  RA Volume:   40.60 ml  19.47 ml/m LA Biplane Vol: 75.5 ml 36.21 ml/m AORTIC VALVE LVOT Vmax:   126.00 cm/s LVOT Vmean:  79.800 cm/s LVOT VTI:    0.266 m AI PHT:      347 msec  AORTA Ao Root diam: 3.00 cm Ao Asc diam:  3.40 cm  MITRAL VALVE  TRICUSPID VALVE MV Area (PHT): 3.36 cm     TR Peak grad:   21.2 mmHg MV Decel Time: 226 msec     TR Vmax:        230.00 cm/s MV E velocity: 118.00 cm/s  Estimated RAP:  3.00 mmHg MV A velocity: 129.00 cm/s  RVSP:           24.2 mmHg MV E/A ratio:  0.91 SHUNTS Systemic VTI:  0.27 m Systemic Diam: 1.90 cm  Wilbert Bihari MD Electronically signed by Wilbert Bihari MD Signature Date/Time: 09/15/2023/10:21:03 AM    Final      CT SCANS  CT CORONARY MORPH W/CTA COR W/SCORE 10/12/2022  Addendum 10/13/2022  4:29 PM ADDENDUM REPORT: 10/13/2022 16:26  EXAM: OVER-READ INTERPRETATION  CT CHEST  The following report is an over-read performed by radiologist Dr. Fonda Mom  [facility] on 10/13/2022. This over-read does not include interpretation of cardiac or coronary anatomy or pathology. The coronary CTA interpretation by the cardiologist is attached.  COMPARISON:  09/14/2011  FINDINGS: Cardiovascular: No incidental pulmonary artery filling defects to suggest PE. Contrast opacification of the pulmonary arteries with suboptimal to evaluate for PE.  Mediastinum/Nodes: No suspicious adenopathy identified. Imaged mediastinal structures are unremarkable.  Lungs/Pleura: Bibasilar 4-5 mm lung nodules identified which are stable compared to 2013 and consistent with a benign process that does not need further follow-up. No pneumonia or pulmonary edema. No pleural effusion or pneumothorax.  Upper Abdomen: No acute abnormality.  Musculoskeletal: No chest wall abnormality. No acute or significant osseous findings.  IMPRESSION: No significant extracardiac incidental findings identified.   Electronically Signed By: Fonda Field M.D. On: 10/13/2022 16:26  Narrative CLINICAL DATA:  63 year old with chest pain, shortness of breath.  EXAM: Cardiac/Coronary  CTA  TECHNIQUE: The patient was scanned on a [employer].  FINDINGS: A 120 kV prospective scan was triggered in the descending thoracic aorta at 111 HU's. Axial non-contrast 3 mm slices were carried out through the heart. The data set was analyzed on a dedicated work station and scored using the Agatson method. Gantry rotation speed was 250 msecs and collimation was .6 mm. 0.8 mg of sl NTG was given. The 3D data set was reconstructed in 5% intervals of the 67-82 % of the R-R cycle. Diastolic phases were analyzed on a dedicated work station using MPR, MIP and VRT modes. The patient received 80 cc of contrast.  Image quality: Poor, misregistration  Aorta:  Normal size.  No calcifications.  No dissection.  Aortic Valve:  Trileaflet.  No calcifications.  Coronary  Arteries:  Normal coronary origin.  Right dominance.  RCA is a large dominant artery that gives rise to PDA and PLA (small in caliber). There is diffuse calcified and non calcified plaque, up to 70-99 distally.  Left main is a large artery that gives rise to LAD and LCX arteries. There is distal calcified stenosis 30-49%  LAD is a large vessel that has proximal calcified plaque 50-69%. Proximal FFR 0.87, mid 0.78, distal 0.65, abnormal suggestive of cumulative diffuse disease.  D1-small caliber, calcified plaque.  FFR proximal 0.81, distal 0.76  LCX is a non-dominant artery.  There is diffuse calcified plaque.  OM1- is moderate sized with mid FFR 0.83 distal 0.75, stenosis of 50-69%  Other findings:  Normal pulmonary vein drainage into the left atrium.  Normal left atrial appendage without a thrombus.  Normal size of the pulmonary artery.  Mild mitral annular calcification (MAC).  Please  see radiology report for non cardiac findings.  IMPRESSION: 1. Coronary calcium  score of 800. This was 6 percentile for age and sex matched control.  2. Total plaque volume (TPV) 856 mm3 which is 84 percentile for age-and sex matched controls (calcified plaque 231 mm3; non-calcified plaque 625 mm3). TPV is extensive.  3. Normal coronary origin with right dominance.  4. Diffuse CAD, moderate left main, moderate LAD and D1, moderate to severe RCA distally, moderate circumflex. Cumulative vessel FFR was abnormal.  5. Mitral annular calcification  Electronically Signed: By: Oneil Parchment M.D. On: 10/12/2022 11:32     ______________________________________________________________________________________________      Risk Assessment/Calculations           Physical Exam VS:  BP 120/77 (BP Location: Left Arm, Patient Position: Sitting, Cuff Size: Large)   Pulse 68   Ht 5' 4 (1.626 m)   Wt 236 lb 12.8 oz (107.4 kg)   SpO2 91%   BMI 40.65 kg/m        Wt Readings from Last  3 Encounters:  02/09/24 236 lb 12.8 oz (107.4 kg)  12/06/23 241 lb (109.3 kg)  09/05/23 232 lb 6.4 oz (105.4 kg)    GEN: Well nourished, well developed in no acute distress NECK: No JVD; No carotid bruits CARDIAC: RRR, no murmurs, rubs, gallops RESPIRATORY:  Clear to auscultation without rales, wheezing or rhonchi  ABDOMEN: Soft, non-tender, non-distended EXTREMITIES:  No edema; No deformity   ASSESSMENT AND PLAN  Chronic diastolic heart failure: She is mildly volume overloaded. Her dry weight is around 232 pounds, she is 236 pounds today.  CAD: History of nonobstructive CAD.  Denies any chest pain  Hypertension: Blood pressure stable  Hyperlipidemia: On atorvastatin  80 mg daily  DM2: Managed by PCP.  On Synjardy .         Dispo: Follow-up with Dr. Barbaraann in 3 to 4 months.  Signed, Scot Ford, PA

## 2024-02-10 LAB — URINE CULTURE

## 2024-02-12 ENCOUNTER — Ambulatory Visit: Payer: Self-pay | Admitting: Physician Assistant

## 2024-02-12 ENCOUNTER — Ambulatory Visit: Payer: Self-pay | Admitting: Urology

## 2024-02-16 ENCOUNTER — Ambulatory Visit (HOSPITAL_COMMUNITY)
Admission: RE | Admit: 2024-02-16 | Discharge: 2024-02-16 | Disposition: A | Discharge status: Home / Self Care | Source: Ambulatory Visit | Attending: Urology | Admitting: Urology

## 2024-02-16 DIAGNOSIS — R109 Unspecified abdominal pain: Secondary | ICD-10-CM

## 2024-02-19 ENCOUNTER — Ambulatory Visit (INDEPENDENT_AMBULATORY_CARE_PROVIDER_SITE_OTHER): Admitting: Adult Health

## 2024-02-19 ENCOUNTER — Encounter: Payer: Self-pay | Admitting: Adult Health

## 2024-02-19 ENCOUNTER — Telehealth: Payer: Self-pay

## 2024-02-19 VITALS — BP 105/70 | HR 85 | Ht 64.0 in | Wt 232.0 lb

## 2024-02-19 DIAGNOSIS — N611 Abscess of the breast and nipple: Secondary | ICD-10-CM

## 2024-02-19 MED ORDER — AMOXICILLIN-POT CLAVULANATE 875-125 MG PO TABS: 1.0000 | ORAL_TABLET | Freq: Two times a day (BID) | ORAL | 0 refills | Status: DC

## 2024-02-19 NOTE — Progress Notes (Signed)
  Subjective:     Patient ID: [name], female   DOB: [birth year], 63 y.o.   MRN: [id number]  HPI [name] is a 63 year old white female, single, sp hysterectomy in complaining of left breast tender with draining mass, that started yesterday, has had abscess in same spot in the past.  Had one in October 2024, and one she self treated in June 2025.  PCP is Susan Cassette NP   Review of Systems +left breast tender with draining mass, that started yesterday, has had abscess in same spot in the past.  Had one in October 2024, and one she self treated in June 2025.   Reviewed past medical,surgical, social and family history. Reviewed medications and allergies.  Objective:   Physical Exam BP 105/70 (BP Location: Right Arm, Patient Position: Sitting, Cuff Size: Large)   Pulse 85   Ht 5' 4 (1.626 m)   Wt 232 lb (105.2 kg)   BMI 39.82 kg/m      Skin warm and dry,  Breasts:no dominate palpable mass, retraction or nipple discharge on the right, on the left, no retraction or nipple discharge, has mass at about 6 0'clock about 1.5 cm in area of erythema of about 3 cm and is draining blood now she said it was green yesterday. Fall risk is low  Upstream - 02/19/24 1128       Pregnancy Intention Screening   Does the patient want to become pregnant in the next year? N/A    Does the patient's partner want to become pregnant in the next year? N/A    Would the patient like to discuss contraceptive options today? N/A      Contraception Wrap Up   Current Method Abstinence;Female Sterilization   hyst   End Method Abstinence;Female Sterilization   hyst   Contraception Counseling Provided No            Assessment:     1. Left breast abscess (Primary) +mass at about 6 0'clock about 1.5 cm in area of erythema of about 3 cm and is draining blood now she said it was green yesterday.   Will rx Augmentin  875-125 mg 1 daily for 10 days and use warm compress, do not squeeze  Meds ordered this encounter   Medications   amoxicillin -clavulanate (AUGMENTIN ) 875-125 MG tablet    Sig: Take 1 tablet by mouth 2 (two) times daily.    Dispense:  20 tablet    Refill:  0    Supervising Provider:   JAYNE VONN Baxter [[id number]]    Plan:     Follow up in 2 weeks for recheck, will refer to general surgery if needed

## 2024-02-19 NOTE — Telephone Encounter (Signed)
 Pt called to schedule an appt with NP for flank pain pt advised that the NP no longer works at this location however our next available appointment wouldn't be until December but in the meantime she could contact her PCP pt voiced her understanding

## 2024-02-27 ENCOUNTER — Other Ambulatory Visit: Payer: Self-pay | Admitting: General Practice

## 2024-03-01 ENCOUNTER — Telehealth: Payer: Self-pay | Admitting: Urology

## 2024-03-01 ENCOUNTER — Encounter: Payer: Self-pay | Admitting: Radiology

## 2024-03-01 NOTE — Telephone Encounter (Signed)
 I called patient with no answer, unable to leave vm.  Mailbox was full.  Imaging routed to Dr. Sherrilee for review.

## 2024-03-01 NOTE — Telephone Encounter (Signed)
 Please review patient imaging.

## 2024-03-01 NOTE — Telephone Encounter (Signed)
  Hurting on side and said ultrasound showed one of her kidneys is not functioning well. She would like a call back

## 2024-03-04 ENCOUNTER — Other Ambulatory Visit: Payer: Self-pay | Admitting: General Practice

## 2024-03-04 ENCOUNTER — Ambulatory Visit: Admitting: Adult Health

## 2024-03-04 ENCOUNTER — Encounter: Payer: Self-pay | Admitting: Adult Health

## 2024-03-04 VITALS — BP 107/71 | HR 78 | Ht 64.0 in | Wt 232.0 lb

## 2024-03-04 DIAGNOSIS — N611 Abscess of the breast and nipple: Secondary | ICD-10-CM | POA: Diagnosis not present

## 2024-03-04 NOTE — Telephone Encounter (Signed)
 Right side flank pain and wants her imagining results.

## 2024-03-04 NOTE — Progress Notes (Signed)
  Subjective:     Patient ID: [name], female   DOB: May 23, 1961, 63 y.o.   MRN: [id number]  HPI [name] is a 63 year old white female,single, sp hysterectomy in for recheck on left breast abscess, and she says it is much better. Has finished Augmentin  from 02/18/14.  PCP is Susan Cassette NP  Review of Systems Left breast abscess is better Reviewed past medical,surgical, social and family history. Reviewed medications and allergies.     Objective:   Physical Exam BP 107/71 (BP Location: Right Arm, Patient Position: Sitting, Cuff Size: Large)   Pulse 78   Ht 5' 4 (1.626 m)   Wt 232 lb (105.2 kg)   BMI 39.82 kg/m     Skin warm and dry.  Lungs: clear to ausculation bilaterally. Cardiovascular: regular rate and rhythm. Left breast: the abscess  at 6 o'clock is less red, no mass felt, has tiny about of drainage on band aide.   Upstream - 03/04/24 0837       Pregnancy Intention Screening   Does the patient want to become pregnant in the next year? N/A    Does the patient's partner want to become pregnant in the next year? N/A    Would the patient like to discuss contraceptive options today? N/A      Contraception Wrap Up   Current Method Abstinence;Female Sterilization   hyst   End Method Abstinence;Female Sterilization   hyst   Contraception Counseling Provided No          Assessment:     1. Left breast abscess (Primary) Abscess is better, mostly resolved, but since recurrent will refer to general surgery Referred to [facility]  - Ambulatory referral to General Surgery     Plan:     Return in about 4 weeks for Pap only

## 2024-03-04 NOTE — Telephone Encounter (Signed)
 Patient left message she is still hurting and her test came back.

## 2024-03-07 ENCOUNTER — Telehealth: Payer: Self-pay | Admitting: Adult Health

## 2024-03-07 NOTE — Telephone Encounter (Signed)
 Patient woke up to blood in her pad. Still bleeding but not heavy. Please advise.

## 2024-03-07 NOTE — Telephone Encounter (Signed)
 Pt woke up to bleeding this am. Its a little heavier than spotting. Feels pain in right side. I spoke with JAG. She will see pt tomorrow at 10:10. JSY

## 2024-03-08 ENCOUNTER — Ambulatory Visit (INDEPENDENT_AMBULATORY_CARE_PROVIDER_SITE_OTHER): Admitting: Adult Health

## 2024-03-08 ENCOUNTER — Encounter: Payer: Self-pay | Admitting: Adult Health

## 2024-03-08 VITALS — BP 118/74 | HR 76 | Ht 64.0 in | Wt 232.0 lb

## 2024-03-08 DIAGNOSIS — Z90710 Acquired absence of both cervix and uterus: Secondary | ICD-10-CM

## 2024-03-08 DIAGNOSIS — B369 Superficial mycosis, unspecified: Secondary | ICD-10-CM | POA: Diagnosis not present

## 2024-03-08 DIAGNOSIS — Z1211 Encounter for screening for malignant neoplasm of colon: Secondary | ICD-10-CM | POA: Diagnosis not present

## 2024-03-08 DIAGNOSIS — N9089 Other specified noninflammatory disorders of vulva and perineum: Secondary | ICD-10-CM

## 2024-03-08 DIAGNOSIS — N939 Abnormal uterine and vaginal bleeding, unspecified: Secondary | ICD-10-CM

## 2024-03-08 DIAGNOSIS — R1031 Right lower quadrant pain: Secondary | ICD-10-CM | POA: Diagnosis not present

## 2024-03-08 DIAGNOSIS — Z90721 Acquired absence of ovaries, unilateral: Secondary | ICD-10-CM

## 2024-03-08 LAB — HEMOCCULT GUIAC POC 1CARD (OFFICE): Fecal Occult Blood, POC: NEGATIVE

## 2024-03-08 MED ORDER — FLUCONAZOLE 150 MG PO TABS: ORAL_TABLET | ORAL | 1 refills | Status: DC

## 2024-03-08 NOTE — Progress Notes (Signed)
  Subjective:     Patient ID: [name], female   DOB: [birth year], 63 y.o.   MRN: [id number]  HPI [name] is a 63 year old white female, single, sp hysterectomy years ago,(?cervical cancer), in having had ?vaginal bleeding yesterday, and has had some pain in right side, has kidney stone.  PCP is MARLA Cassette NP  Review of Systems ?vaginal bleeding yesterday Reviewed past medical,surgical, social and family history. Reviewed medications and allergies.     Objective:   Physical Exam BP 118/74 (BP Location: Right Arm, Patient Position: Sitting, Cuff Size: Large)   Pulse 76   Ht 5' 4 (1.626 m)   Wt 232 lb (105.2 kg)   BMI 39.82 kg/m     Skin warm and dry.Pelvic: external genitalia is red and irritated, in groin and under panniculus, painted with gentian violet,  vagina: no blood noted or lesions, can feel mesh,urethra has no lesions or masses noted, cervix and uterus are absent, adnexa: no masses, RLQ tenderness noted. Bladder is non tender and no masses felt.On rectal exam, no mass felt and hemoccult was negative   Upstream - 03/08/24 0950       Pregnancy Intention Screening   Does the patient want to become pregnant in the next year? N/A    Does the patient's partner want to become pregnant in the next year? N/A    Would the patient like to discuss contraceptive options today? N/A      Contraception Wrap Up   Current Method Female Sterilization   hyst   End Method Female Sterilization   hyst   Contraception Counseling Provided No         Examination chaperoned by Clarita Salt LPN  Assessment:     1. Vaginal bleeding (Primary) Had ?vaginal bleeding yesterday, none today   2. S/P hysterectomy with oophorectomy Had years ago, she thinks could have had ?cervical cancer  3. Superficial fungus infection of skin Red and irritated in groin and unde panniculus Painted with gentian violet and rx diflucan  Try to keep dry Meds ordered this encounter  Medications   fluconazole   (DIFLUCAN ) 150 MG tablet    Sig: Take 1 now and 1 in days, do not Lipitor while taking diflucan     Dispense:  2 tablet    Refill:  1    Supervising Provider:   JAYNE, LUTHER H [[id number]]     4. Lesion of vulva Has 1.5 cm firm lesion right vulva at the top  Dr JAYNE in for co exam, will return 04/02/24 for removal with Dr JAYNE   5. RLQ abdominal pain Has right kidney stone   6. Encounter for screening fecal occult blood testing Hemoccult was negative  - POCT occult blood stool     Plan:     Return 04/01/24 for removal of vulva lesion and pap

## 2024-03-21 ENCOUNTER — Encounter: Payer: Self-pay | Admitting: Surgery

## 2024-03-21 ENCOUNTER — Encounter: Payer: Self-pay | Admitting: *Deleted

## 2024-03-21 ENCOUNTER — Ambulatory Visit (INDEPENDENT_AMBULATORY_CARE_PROVIDER_SITE_OTHER): Admitting: Surgery

## 2024-03-21 VITALS — BP 122/75 | HR 77 | Temp 97.5°F | Resp 18 | Ht 64.0 in | Wt 235.0 lb

## 2024-03-21 DIAGNOSIS — N6002 Solitary cyst of left breast: Secondary | ICD-10-CM

## 2024-03-21 NOTE — H&P (Signed)
 [facility] History and Physical  Reason for Referral: Recurrent left breast abscess Referring Physician: Delon Lewis, NP  Chief Complaint   New Patient (Initial Visit)     [name] is a 63 y.o. female.  HPI: Patient presents for evaluation of recurrent left breast abscess.  She was previously told that she had a cyst in this location.  She has had 3 different instances where the area has become sore, swollen, and had green drainage.  The first episode of inflammation was in October 2024, second episode in June of this year, and her most recent episode this past August.  She has been put on antibiotics during these episodes.  She never has had incision and drainage of the area, as it is drained on its own.  She occasionally feels a bump there when it is not inflamed.  It has always been the same spot.  She denies any fevers or chills during these episodes.  The patient has no history of any masses, lumps, bumps, nipple changes or discharge. She had menarche at age [age], and her first pregnancy at age 27. She is G2P2. She has no history of any family breast cancer. She underwent menopause in her late 71s when she underwent hysterectomy.  She has never had any previous biopsies or concerning areas on mammogram.  She has not had any chest radiation.  Her past medical history significant for depression, anxiety, hyperlipidemia, chronic back pain, possible cervical cancer, kidney stones, and diabetes.  She takes an 81 mg aspirin  daily.  She has had multiple orthopedic surgeries.  She vapes daily and goes through a cartridge every 3 days.  She denies use of alcohol and illicit drugs.  Past Medical History:  Diagnosis Date   Anxiety    Arthritis    Asthma    Cancer (HCC)    cervical cancer; pt states a long long time ago   CHF (congestive heart failure) (HCC)    Coronary artery disease    Depression    Diabetes mellitus without complication (HCC)    Dyspnea     Edema of all four extremities    Essential (primary) hypertension 06/24/2020   Hyperlipemia    Type II diabetes mellitus (HCC) 03/22/2022    Past Surgical History:  Procedure Laterality Date   ABDOMINAL HYSTERECTOMY     ANTERIOR LAT LUMBAR FUSION N/A 11/05/2019   Procedure: Lumbar Four-Five Anterolateral lumbar interbody fusion with lateral plate;  Surgeon: Colon Shove, MD;  Location: [facility];  Service: Neurosurgery;  Laterality: N/A;  anterolateral   BACK SURGERY     pins   BACK SURGERY  07/11/2008   BIOPSY  06/29/2021   Procedure: BIOPSY;  Surgeon: Eartha Angelia Sieving, MD;  [facility]: [facility];  Service: Gastroenterology;;   BLADDER REPAIR     CARPAL TUNNEL RELEASE Right    CESAREAN SECTION  1982, 1984   COLONOSCOPY N/A 06/12/2013   rehman: normal   COLONOSCOPY WITH PROPOFOL  N/A 06/27/2023   Procedure: COLONOSCOPY WITH PROPOFOL ;  Surgeon: Cinderella Deatrice FALCON, MD;  [facility];  Service: Endoscopy;  Laterality: N/A;  2:00PM;ASA 3   CORONARY PRESSURE/FFR STUDY N/A 10/20/2022   Procedure: CORONARY PRESSURE/FFR STUDY;  Surgeon: Wendel Lurena POUR, MD;  [facility]: [facility];  Service: Cardiovascular;  Laterality: N/A;   CYSTOSCOPY WITH INJECTION N/A 03/23/2023   Procedure: CYSTOSCOPY WITH INJECTION-botox ;  Surgeon: Sherrilee Belvie CROME, MD;  Location: [facility];  Service: Urology;  Laterality: N/A;  ESOPHAGOGASTRODUODENOSCOPY (EGD) WITH PROPOFOL  N/A 06/29/2021   Procedure: ESOPHAGOGASTRODUODENOSCOPY (EGD) WITH PROPOFOL ;  Surgeon: Eartha Angelia Sieving, MD;  [facility];  Service: Gastroenterology;  Laterality: N/A;  8:40   KNEE ARTHROSCOPY WITH MEDIAL MENISECTOMY Right 05/09/2019   Procedure: KNEE ARTHROSCOPY WITH MEDIAL MENISECTOMY AND LATERAL MENISECTOMY;  Surgeon: Margrette Taft BRAVO, MD;  Location: [facility];  Service: Orthopedics;  Laterality: Right;   KNEE ARTHROSCOPY WITH MEDIAL MENISECTOMY Left 06/27/2019   Procedure: KNEE ARTHROSCOPY WITH MEDIAL  MENISCECTOMY;  Surgeon: Margrette Taft BRAVO, MD;  Location: [facility];  Service: Orthopedics;  Laterality: Left;   LEFT HEART CATH AND CORONARY ANGIOGRAPHY N/A 10/20/2022   Procedure: LEFT HEART CATH AND CORONARY ANGIOGRAPHY;  Surgeon: Wendel Lurena POUR, MD;  [facility];  Service: Cardiovascular;  Laterality: N/A;   POLYPECTOMY  06/27/2023   Procedure: POLYPECTOMY;  Surgeon: Cinderella Deatrice FALCON, MD;  [facility];  Service: Endoscopy;;   TOTAL KNEE ARTHROPLASTY Left 03/15/2022   Procedure: TOTAL KNEE ARTHROPLASTY;  Surgeon: Margrette Taft BRAVO, MD;  [facility]: [facility];  Service: Orthopedics;  Laterality: Left;   TOTAL KNEE ARTHROPLASTY Right 02/21/2023   Procedure: TOTAL KNEE ARTHROPLASTY;  Surgeon: Margrette Taft BRAVO, MD;  [facility]: [facility];  Service: Orthopedics;  Laterality: Right;    Family History  Problem Relation Age of Onset   Cancer Paternal Grandfather    Cancer Paternal Grandmother    Cancer Maternal Grandmother    Heart disease Father    Lung cancer Mother    Other Brother        gunshot   Colon cancer Other     Social History   Tobacco Use   Smoking status: Former    Current packs/day: 1.00    Average packs/day: 1 pack/day for 20.0 years (20.0 ttl pk-yrs)    Types: Cigarettes   Smokeless tobacco: Never   Tobacco comments:    trying to quit  Vaping Use   Vaping status: Former  Substance Use Topics   Alcohol use: No    Alcohol/week: 0.0 standard drinks of alcohol   Drug use: No    Medications: I have reviewed the patient's current medications. Allergies as of 03/21/2024   No Known Allergies      Medication List        Accurate as of March 21, 2024  9:09 AM. If you have any questions, ask your nurse or doctor.          albuterol  108 (90 Base) MCG/ACT inhaler Commonly known as: VENTOLIN  HFA Inhale 1-2 puffs into the lungs every 6 (six) hours as needed for wheezing or shortness of breath.   ARIPiprazole  2 MG tablet Commonly  known as: ABILIFY  Take 2 mg by mouth daily.   aspirin  81 MG chewable tablet Chew 1 tablet (81 mg total) by mouth 2 (two) times daily.   atorvastatin  80 MG tablet Commonly known as: LIPITOR Take 0.5 tablets (40 mg total) by mouth at bedtime.   estradiol  2 MG tablet Commonly known as: ESTRACE  TAKE ONE TABLET AT BEDTIME   fluconazole  150 MG tablet Commonly known as: DIFLUCAN  Take 1 now and 1 in days, do not Lipitor while taking diflucan    FLUoxetine  40 MG capsule Commonly known as: PROZAC  Take 40 mg by mouth daily.   gabapentin  300 MG capsule Commonly known as: NEURONTIN  Take 1 capsule (300 mg total) by mouth 3 (three) times daily.   loratadine  10 MG tablet Commonly known as: CLARITIN  Take 10 mg by  mouth daily.   nitroGLYCERIN  0.4 MG SL tablet Commonly known as: NITROSTAT  Place 1 tablet (0.4 mg total) under the tongue every 5 (five) minutes as needed for chest pain.   oxyCODONE -acetaminophen  7.5-325 MG tablet Commonly known as: PERCOCET Take 1 tablet by mouth 4 (four) times daily.   potassium chloride  SA 20 MEQ tablet Commonly known as: KLOR-CON  M Take 1 tablet (20 mEq total) by mouth daily.   spironolactone  25 MG tablet Commonly known as: Aldactone  Take 0.5 tablets (12.5 mg total) by mouth daily.   Synjardy  XR 12.11-998 MG Tb24 Generic drug: Empagliflozin -metFORMIN  HCl ER Take 1 tablet by mouth daily.   torsemide  20 MG tablet Commonly known as: DEMADEX  Take 2 tablets (40 mg total) by mouth daily.   traMADol  50 MG tablet Commonly known as: Ultram  Take 1 tablet (50 mg total) by mouth every 6 (six) hours as needed.   traZODone  100 MG tablet Commonly known as: DESYREL  Take 200 mg by mouth at bedtime.   TYLENOL  500 MG tablet Generic drug: acetaminophen  Take 500 mg by mouth every 6 (six) hours as needed for moderate pain (pain score 4-6).         ROS:  Constitutional: negative for chills, fatigue, and fevers Eyes: negative for visual disturbance and  pain Ears, nose, mouth, throat, and face: negative for ear drainage, sore throat, and sinus problems Respiratory: positive for shortness of breath, negative for cough and wheezing Cardiovascular: negative for chest pain and palpitations Gastrointestinal: negative for abdominal pain, nausea, reflux symptoms, and vomiting Genitourinary:positive for frequency, negative for dysuria Integument/breast: negative for dryness and rash Hematologic/lymphatic: negative for bleeding and lymphadenopathy Musculoskeletal:positive for back pain, negative for neck pain Neurological: negative for dizziness and tremors Endocrine: negative for temperature intolerance  Blood pressure 122/75, pulse 77, temperature (!) 97.5 F (36.4 C), temperature source Oral, resp. rate 18, height 5' 4 (1.626 m), weight 235 lb (106.6 kg), SpO2 90%. Physical Exam Vitals reviewed.  Constitutional:      Appearance: Normal appearance.  HENT:     Head: Normocephalic and atraumatic.  Eyes:     Extraocular Movements: Extraocular movements intact.     Pupils: Pupils are equal, round, and reactive to light.  Cardiovascular:     Rate and Rhythm: Normal rate and regular rhythm.  Pulmonary:     Effort: Pulmonary effort is normal.     Breath sounds: Normal breath sounds.  Chest:  Breasts:    Right: No swelling, bleeding, inverted nipple, mass, nipple discharge, skin change or tenderness.     Left: No swelling, bleeding, inverted nipple, mass, nipple discharge, skin change or tenderness.     Comments: No palpable abnormalities on the right breast; left breast with scarring from previous cyst that drained without palpable underlying mass, area nontender, and noninflamed at this time Abdominal:     General: There is no distension.     Palpations: Abdomen is soft.     Tenderness: There is no abdominal tenderness.  Musculoskeletal:        General: Normal range of motion.     Cervical back: Normal range of motion.  Lymphadenopathy:      Upper Body:     Right upper body: No axillary adenopathy.     Left upper body: No axillary adenopathy.  Skin:    General: Skin is warm and dry.  Neurological:     General: No focal deficit present.     Mental Status: She is alert and oriented to person, place, and time.  Psychiatric:        Mood and Affect: Mood normal.        Behavior: Behavior normal.     Results: Bilateral screening mammogram (/14/25): IMPRESSION: No mammographic evidence of malignancy. A result letter of this screening mammogram will be mailed directly to the patient.   RECOMMENDATION: Screening mammogram in one year. (Code:SM-B-01Y)   BI-RADS CATEGORY  1: Negative.   Assessment & Plan:  [name] is a 63 y.o. female who presents for evaluation of recurrent breast abscess.   -We discussed pathophysiology of cysts and the recommendations for treatment.  We have also discussed options ranging from aspiration to cyst excision.  Patient expressed that she would like to proceed with cyst excision at this time rather than watchful waiting and/or future aspirations. -The risk and benefits of left breast cyst excision were discussed including but not limited to bleeding, infection, injury to surrounding structures, need for additional procedures, and recurrent cyst.  After careful consideration, [name] has decided to proceed with surgery.  -Patient tentatively scheduled for surgery on 10/6 -Information provided to the patient regarding breast cyst, epidermoid cyst, and breast cyst excision -According to the [facility] model, the patient has a 1.1% 5-year risk of breast cancer and a 5.2% lifetime risk of breast cancer  All questions were answered to the satisfaction of the patient.   Note: Portions of this report may have been transcribed using voice recognition software. Every effort has been made to ensure accuracy; however, inadvertent computerized transcription errors may still be present.    Dorothyann Brittle, DO [facility] [address] [place], [place] [address] [phone] (office)

## 2024-03-21 NOTE — Addendum Note (Signed)
 Addended by: SAUNDRA TAWNI DEL on: 03/21/2024 01:14 PM   Modules accepted: Orders

## 2024-03-21 NOTE — Progress Notes (Signed)
 [facility] History and Physical  Reason for Referral: Recurrent left breast abscess Referring Physician: Delon Lewis, NP  Chief Complaint   New Patient (Initial Visit)     [name] is a 63 y.o. female.  HPI: Patient presents for evaluation of recurrent left breast abscess.  She was previously told that she had a cyst in this location.  She has had 3 different instances where the area has become sore, swollen, and had green drainage.  The first episode of inflammation was in October 2024, second episode in June of this year, and her most recent episode this past August.  She has been put on antibiotics during these episodes.  She never has had incision and drainage of the area, as it is drained on its own.  She occasionally feels a bump there when it is not inflamed.  It has always been the same spot.  She denies any fevers or chills during these episodes.  The patient has no history of any masses, lumps, bumps, nipple changes or discharge. She had menarche at age [age], and her first pregnancy at age 27. She is G2P2. She has no history of any family breast cancer. She underwent menopause in her late 71s when she underwent hysterectomy.  She has never had any previous biopsies or concerning areas on mammogram.  She has not had any chest radiation.  Her past medical history significant for depression, anxiety, hyperlipidemia, chronic back pain, possible cervical cancer, kidney stones, and diabetes.  She takes an 81 mg aspirin  daily.  She has had multiple orthopedic surgeries.  She vapes daily and goes through a cartridge every 3 days.  She denies use of alcohol and illicit drugs.  Past Medical History:  Diagnosis Date   Anxiety    Arthritis    Asthma    Cancer (HCC)    cervical cancer; pt states a long long time ago   CHF (congestive heart failure) (HCC)    Coronary artery disease    Depression    Diabetes mellitus without complication (HCC)    Dyspnea     Edema of all four extremities    Essential (primary) hypertension 06/24/2020   Hyperlipemia    Type II diabetes mellitus (HCC) 03/22/2022    Past Surgical History:  Procedure Laterality Date   ABDOMINAL HYSTERECTOMY     ANTERIOR LAT LUMBAR FUSION N/A 11/05/2019   Procedure: Lumbar Four-Five Anterolateral lumbar interbody fusion with lateral plate;  Surgeon: Colon Shove, MD;  Location: [facility];  Service: Neurosurgery;  Laterality: N/A;  anterolateral   BACK SURGERY     pins   BACK SURGERY  07/11/2008   BIOPSY  06/29/2021   Procedure: BIOPSY;  Surgeon: Eartha Angelia Sieving, MD;  [facility];  Service: Gastroenterology;;   BLADDER REPAIR     CARPAL TUNNEL RELEASE Right    CESAREAN SECTION  1982, 1984   COLONOSCOPY N/A 06/12/2013   rehman: normal   COLONOSCOPY WITH PROPOFOL  N/A 06/27/2023   Procedure: COLONOSCOPY WITH PROPOFOL ;  Surgeon: Cinderella Deatrice FALCON, MD;  [facility]: [facility];  Service: Endoscopy;  Laterality: N/A;  2:00PM;ASA 3   CORONARY PRESSURE/FFR STUDY N/A 10/20/2022   Procedure: CORONARY PRESSURE/FFR STUDY;  Surgeon: Wendel Lurena POUR, MD;  [facility];  Service: Cardiovascular;  Laterality: N/A;   CYSTOSCOPY WITH INJECTION N/A 03/23/2023   Procedure: CYSTOSCOPY WITH INJECTION-botox ;  Surgeon: Sherrilee Belvie CROME, MD;  Location: [facility];  Service: Urology;  Laterality: N/A;  ESOPHAGOGASTRODUODENOSCOPY (EGD) WITH PROPOFOL  N/A 06/29/2021   Procedure: ESOPHAGOGASTRODUODENOSCOPY (EGD) WITH PROPOFOL ;  Surgeon: Eartha Angelia Sieving, MD;  [facility];  Service: Gastroenterology;  Laterality: N/A;  8:40   KNEE ARTHROSCOPY WITH MEDIAL MENISECTOMY Right 05/09/2019   Procedure: KNEE ARTHROSCOPY WITH MEDIAL MENISECTOMY AND LATERAL MENISECTOMY;  Surgeon: Margrette Taft BRAVO, MD;  Location: [facility];  Service: Orthopedics;  Laterality: Right;   KNEE ARTHROSCOPY WITH MEDIAL MENISECTOMY Left 06/27/2019   Procedure: KNEE ARTHROSCOPY WITH MEDIAL  MENISCECTOMY;  Surgeon: Margrette Taft BRAVO, MD;  Location: [facility];  Service: Orthopedics;  Laterality: Left;   LEFT HEART CATH AND CORONARY ANGIOGRAPHY N/A 10/20/2022   Procedure: LEFT HEART CATH AND CORONARY ANGIOGRAPHY;  Surgeon: Wendel Lurena POUR, MD;  [facility];  Service: Cardiovascular;  Laterality: N/A;   POLYPECTOMY  06/27/2023   Procedure: POLYPECTOMY;  Surgeon: Cinderella Deatrice FALCON, MD;  [facility];  Service: Endoscopy;;   TOTAL KNEE ARTHROPLASTY Left 03/15/2022   Procedure: TOTAL KNEE ARTHROPLASTY;  Surgeon: Margrette Taft BRAVO, MD;  Location: [facility];  Service: Orthopedics;  Laterality: Left;   TOTAL KNEE ARTHROPLASTY Right 02/21/2023   Procedure: TOTAL KNEE ARTHROPLASTY;  Surgeon: Margrette Taft BRAVO, MD;  Location: [facility];  Service: Orthopedics;  Laterality: Right;    Family History  Problem Relation Age of Onset   Cancer Paternal Grandfather    Cancer Paternal Grandmother    Cancer Maternal Grandmother    Heart disease Father    Lung cancer Mother    Other Brother        gunshot   Colon cancer Other     Social History   Tobacco Use   Smoking status: Former    Current packs/day: 1.00    Average packs/day: 1 pack/day for 20.0 years (20.0 ttl pk-yrs)    Types: Cigarettes   Smokeless tobacco: Never   Tobacco comments:    trying to quit  Vaping Use   Vaping status: Former  Substance Use Topics   Alcohol use: No    Alcohol/week: 0.0 standard drinks of alcohol   Drug use: No    Medications: I have reviewed the patient's current medications. Allergies as of 03/21/2024   No Known Allergies      Medication List        Accurate as of March 21, 2024  9:09 AM. If you have any questions, ask your nurse or doctor.          albuterol  108 (90 Base) MCG/ACT inhaler Commonly known as: VENTOLIN  HFA Inhale 1-2 puffs into the lungs every 6 (six) hours as needed for wheezing or shortness of breath.   ARIPiprazole  2 MG tablet Commonly  known as: ABILIFY  Take 2 mg by mouth daily.   aspirin  81 MG chewable tablet Chew 1 tablet (81 mg total) by mouth 2 (two) times daily.   atorvastatin  80 MG tablet Commonly known as: LIPITOR Take 0.5 tablets (40 mg total) by mouth at bedtime.   estradiol  2 MG tablet Commonly known as: ESTRACE  TAKE ONE TABLET AT BEDTIME   fluconazole  150 MG tablet Commonly known as: DIFLUCAN  Take 1 now and 1 in days, do not Lipitor while taking diflucan    FLUoxetine  40 MG capsule Commonly known as: PROZAC  Take 40 mg by mouth daily.   gabapentin  300 MG capsule Commonly known as: NEURONTIN  Take 1 capsule (300 mg total) by mouth 3 (three) times daily.   loratadine  10 MG tablet Commonly known as: CLARITIN  Take 10 mg by  mouth daily.   nitroGLYCERIN  0.4 MG SL tablet Commonly known as: NITROSTAT  Place 1 tablet (0.4 mg total) under the tongue every 5 (five) minutes as needed for chest pain.   oxyCODONE -acetaminophen  7.5-325 MG tablet Commonly known as: PERCOCET Take 1 tablet by mouth 4 (four) times daily.   potassium chloride  SA 20 MEQ tablet Commonly known as: KLOR-CON  M Take 1 tablet (20 mEq total) by mouth daily.   spironolactone  25 MG tablet Commonly known as: Aldactone  Take 0.5 tablets (12.5 mg total) by mouth daily.   Synjardy  XR 12.11-998 MG Tb24 Generic drug: Empagliflozin -metFORMIN  HCl ER Take 1 tablet by mouth daily.   torsemide  20 MG tablet Commonly known as: DEMADEX  Take 2 tablets (40 mg total) by mouth daily.   traMADol  50 MG tablet Commonly known as: Ultram  Take 1 tablet (50 mg total) by mouth every 6 (six) hours as needed.   traZODone  100 MG tablet Commonly known as: DESYREL  Take 200 mg by mouth at bedtime.   TYLENOL  500 MG tablet Generic drug: acetaminophen  Take 500 mg by mouth every 6 (six) hours as needed for moderate pain (pain score 4-6).         ROS:  Constitutional: negative for chills, fatigue, and fevers Eyes: negative for visual disturbance and  pain Ears, nose, mouth, throat, and face: negative for ear drainage, sore throat, and sinus problems Respiratory: positive for shortness of breath, negative for cough and wheezing Cardiovascular: negative for chest pain and palpitations Gastrointestinal: negative for abdominal pain, nausea, reflux symptoms, and vomiting Genitourinary:positive for frequency, negative for dysuria Integument/breast: negative for dryness and rash Hematologic/lymphatic: negative for bleeding and lymphadenopathy Musculoskeletal:positive for back pain, negative for neck pain Neurological: negative for dizziness and tremors Endocrine: negative for temperature intolerance  Blood pressure 122/75, pulse 77, temperature (!) 97.5 F (36.4 C), temperature source Oral, resp. rate 18, height 5' 4 (1.626 m), weight 235 lb (106.6 kg), SpO2 90%. Physical Exam Vitals reviewed.  Constitutional:      Appearance: Normal appearance.  HENT:     Head: Normocephalic and atraumatic.  Eyes:     Extraocular Movements: Extraocular movements intact.     Pupils: Pupils are equal, round, and reactive to light.  Cardiovascular:     Rate and Rhythm: Normal rate and regular rhythm.  Pulmonary:     Effort: Pulmonary effort is normal.     Breath sounds: Normal breath sounds.  Chest:  Breasts:    Right: No swelling, bleeding, inverted nipple, mass, nipple discharge, skin change or tenderness.     Left: No swelling, bleeding, inverted nipple, mass, nipple discharge, skin change or tenderness.     Comments: No palpable abnormalities on the right breast; left breast with scarring from previous cyst that drained without palpable underlying mass, area nontender, and noninflamed at this time Abdominal:     General: There is no distension.     Palpations: Abdomen is soft.     Tenderness: There is no abdominal tenderness.  Musculoskeletal:        General: Normal range of motion.     Cervical back: Normal range of motion.  Lymphadenopathy:      Upper Body:     Right upper body: No axillary adenopathy.     Left upper body: No axillary adenopathy.  Skin:    General: Skin is warm and dry.  Neurological:     General: No focal deficit present.     Mental Status: She is alert and oriented to person, place, and time.  Psychiatric:        Mood and Affect: Mood normal.        Behavior: Behavior normal.     Results: Bilateral screening mammogram (/14/25): IMPRESSION: No mammographic evidence of malignancy. A result letter of this screening mammogram will be mailed directly to the patient.   RECOMMENDATION: Screening mammogram in one year. (Code:SM-B-01Y)   BI-RADS CATEGORY  1: Negative.   Assessment & Plan:  [name] is a 63 y.o. female who presents for evaluation of recurrent breast abscess.   -We discussed pathophysiology of cysts and the recommendations for treatment.  We have also discussed options ranging from aspiration to cyst excision.  Patient expressed that she would like to proceed with cyst excision at this time rather than watchful waiting and/or future aspirations. -The risk and benefits of left breast cyst excision were discussed including but not limited to bleeding, infection, injury to surrounding structures, need for additional procedures, and recurrent cyst.  After careful consideration, [name] has decided to proceed with surgery.  -Patient tentatively scheduled for surgery on 10/6 -Information provided to the patient regarding breast cyst, epidermoid cyst, and breast cyst excision -According to the [facility] model, the patient has a 1.1% 5-year risk of breast cancer and a 5.2% lifetime risk of breast cancer  All questions were answered to the satisfaction of the patient.   Note: Portions of this report may have been transcribed using voice recognition software. Every effort has been made to ensure accuracy; however, inadvertent computerized transcription errors may still be present.    Dorothyann Brittle, DO [facility] [address] [place] [place], [place] [address] [phone] (office)

## 2024-03-29 IMAGING — DX DG KNEE 1-2V*L*
2 series · 2 of 2 positions shown · non-contrast
Comparison: October 29, 2021

CLINICAL DATA: Primary localized osteoarthrosis of lower leg,
unspecified laterality

EXAM:
LEFT KNEE - 1-2 VIEW

[knee ap]
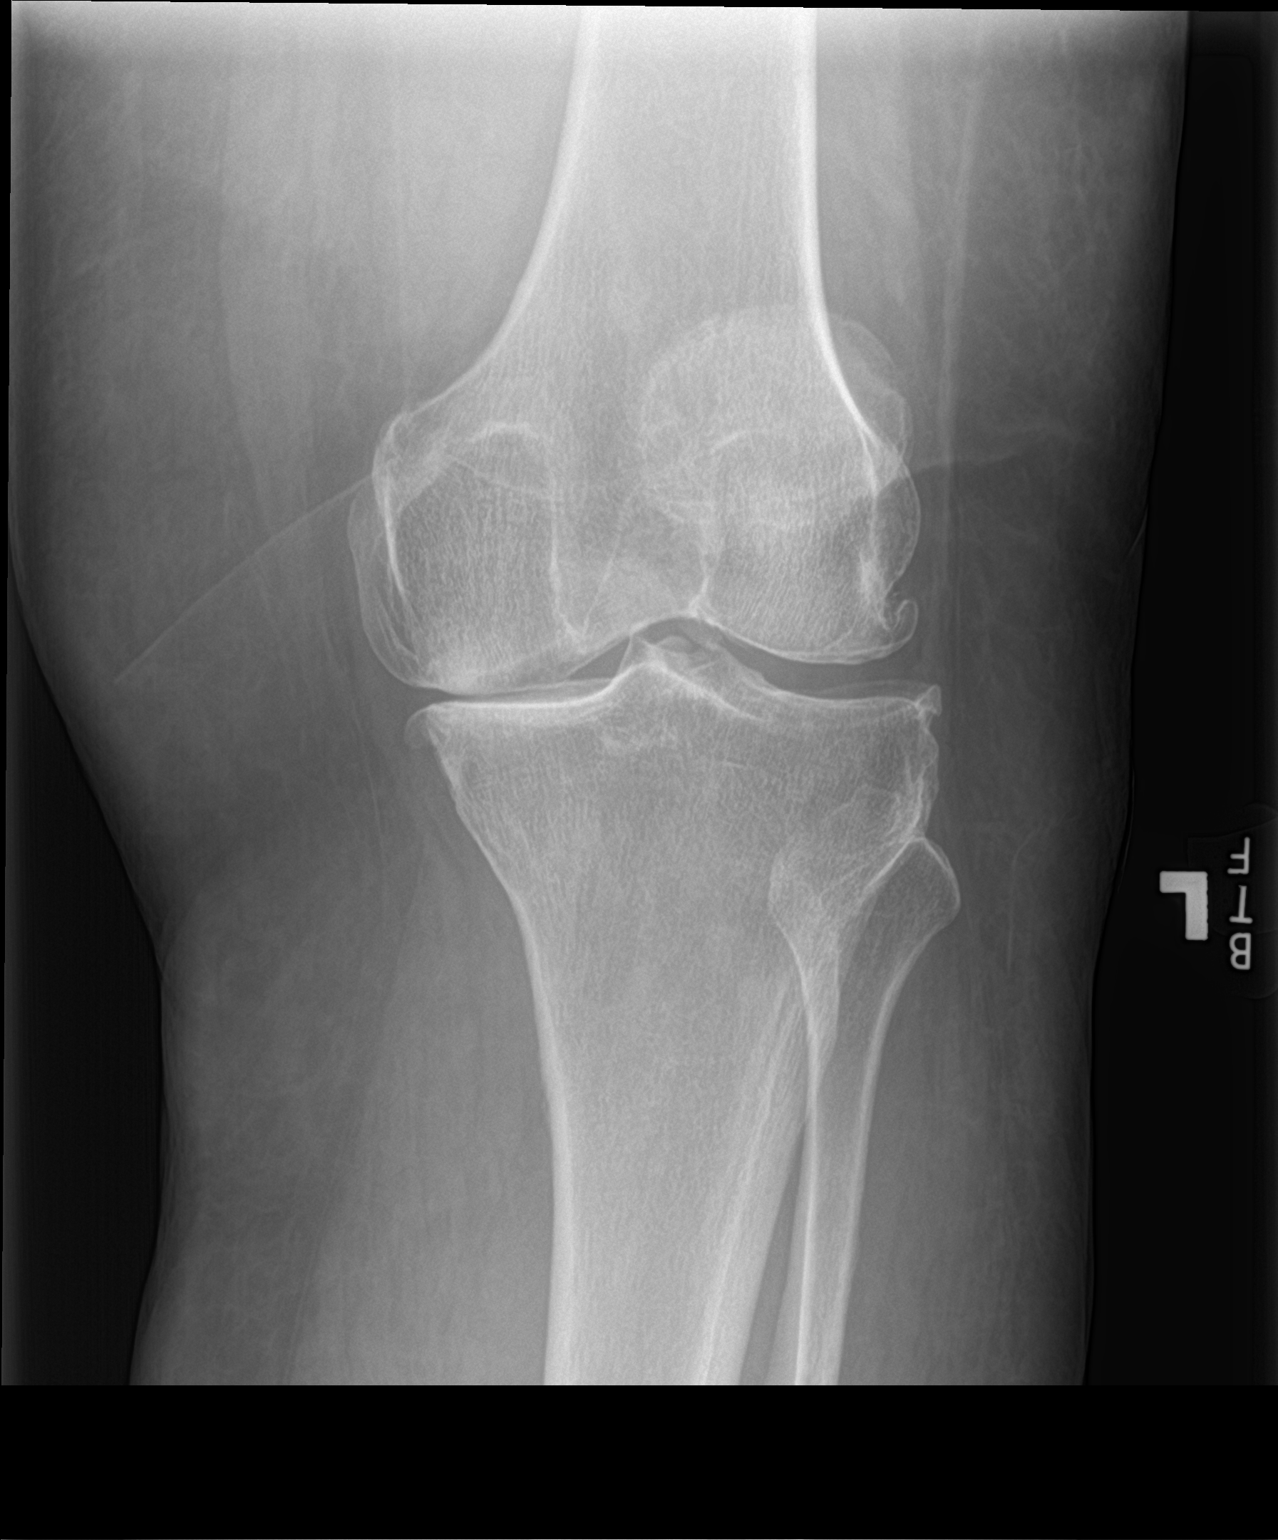

[knee lat]
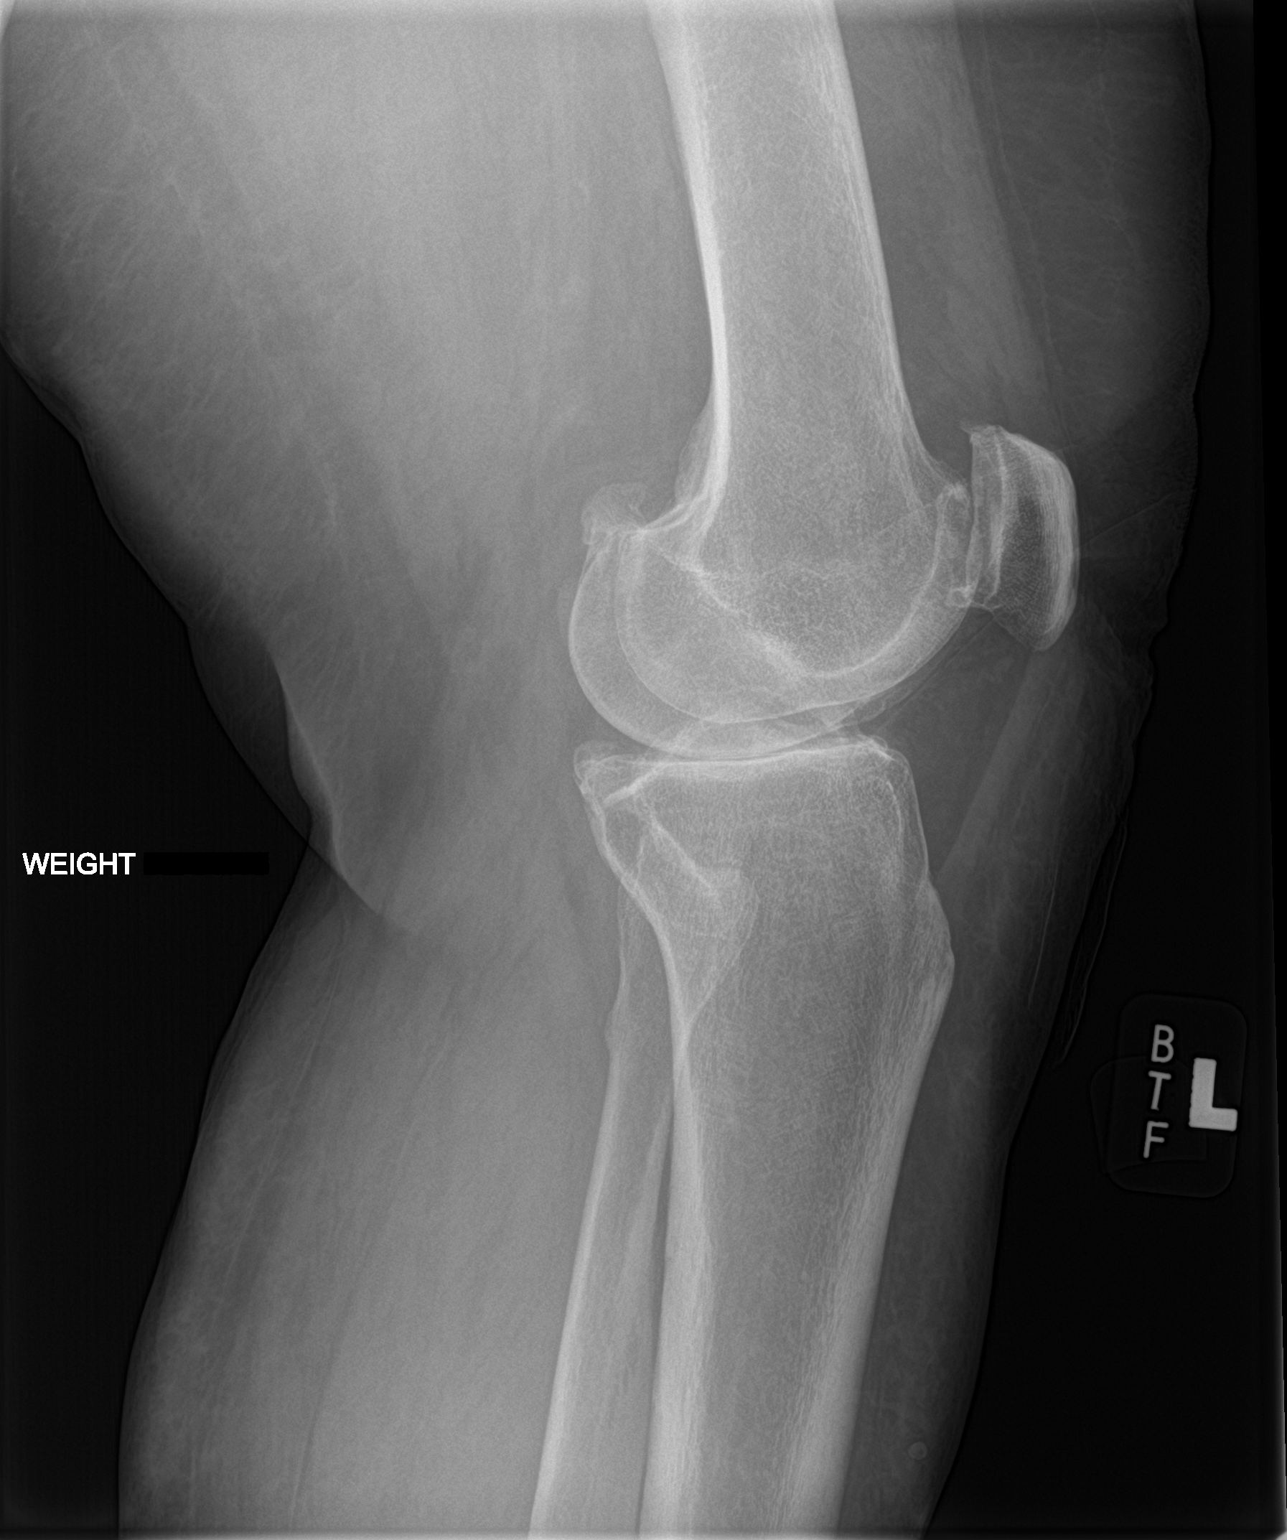

[2 of 2 positions shown; findings below may reference images not displayed]

FINDINGS: Osteopenia. No acute fracture or dislocation. Tricompartmental
degenerative changes most pronounced in the medial compartment with
severe joint space narrowing and osteophyte proliferation. There are
moderate degenerative changes in the lateral and patellofemoral
compartments. No area of erosion or osseous destruction. No
unexpected radiopaque foreign body. Soft tissues are unremarkable.
IMPRESSION: Tricompartmental degenerative changes most pronounced in the medial
compartment.

## 2024-03-29 IMAGING — DX DG KNEE 1-2V*R*
2 series · 2 of 2 positions shown · non-contrast
Comparison: April 29, 2021

CLINICAL DATA: Primary localized osteoarthrosis of lower leg,
unspecified laterality

EXAM:
RIGHT KNEE - 1-2 VIEW

[knee ap]
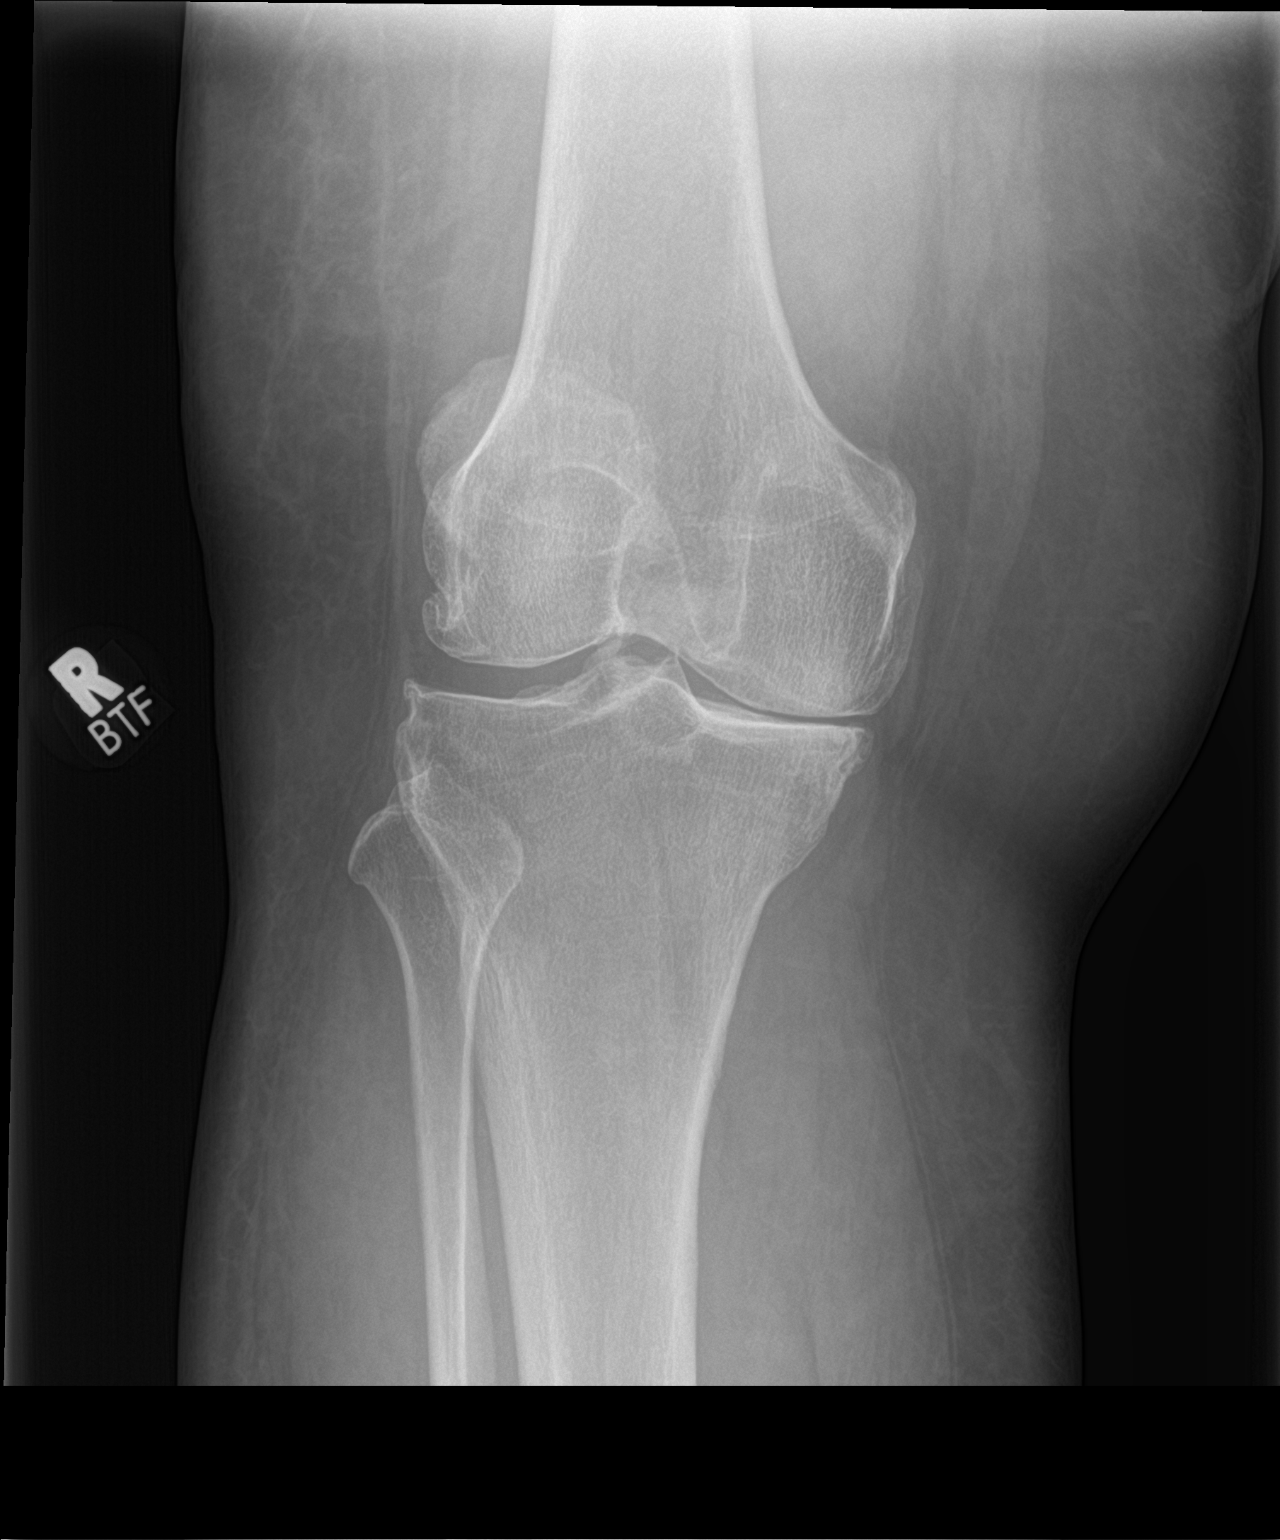

[knee lat]
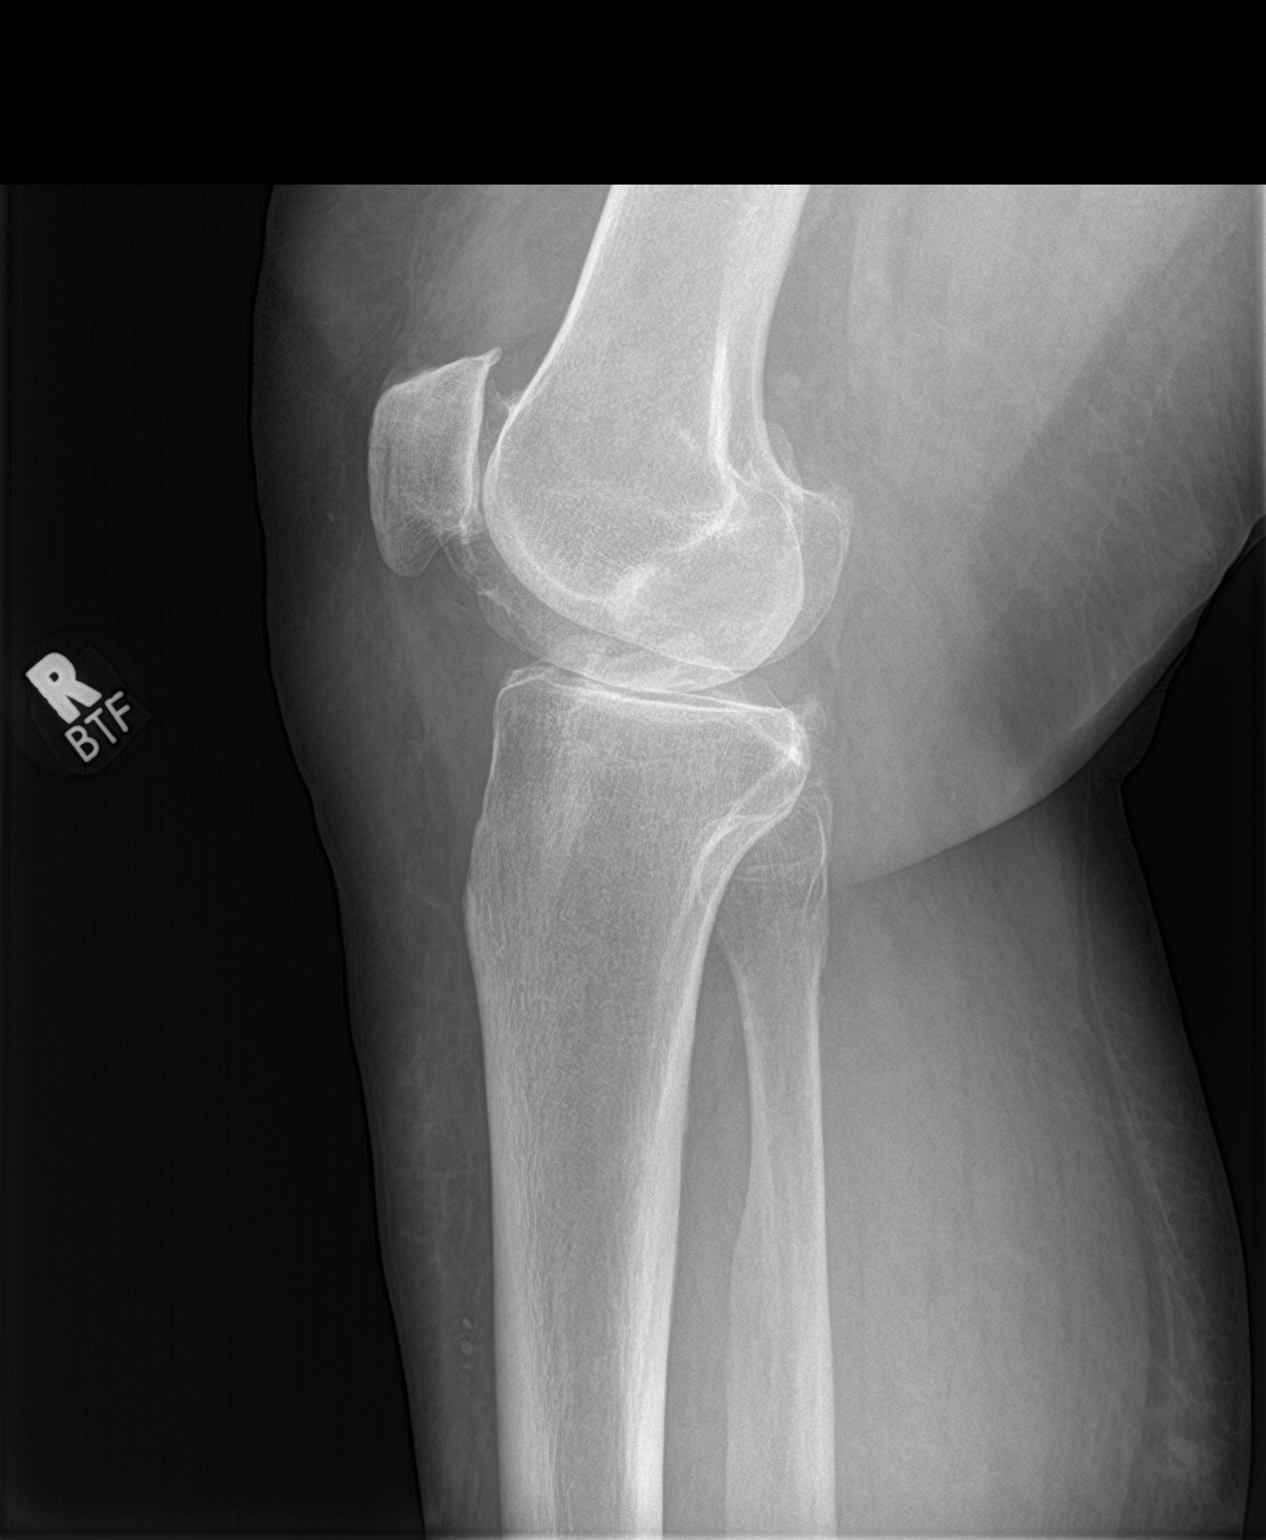

[2 of 2 positions shown; findings below may reference images not displayed]

FINDINGS: Osteopenia. No acute fracture or dislocation. There are
tricompartmental degenerative changes. These are most pronounced in
the medial compartment with severe joint space narrowing. There are
mild patellofemoral compartment degenerative changes moderate
lateral compartment degenerative changes. No area of erosion or
osseous destruction. No unexpected radiopaque foreign body. Small
joint effusion.
IMPRESSION: Tricompartmental degenerative changes most pronounced in the medial
compartment.

## 2024-04-01 ENCOUNTER — Ambulatory Visit: Admitting: Adult Health

## 2024-04-02 ENCOUNTER — Encounter: Payer: Self-pay | Admitting: Obstetrics & Gynecology

## 2024-04-02 ENCOUNTER — Other Ambulatory Visit (HOSPITAL_COMMUNITY)
Admission: RE | Admit: 2024-04-02 | Discharge: 2024-04-02 | Disposition: A | Discharge status: Home / Self Care | Source: Ambulatory Visit | Attending: Obstetrics & Gynecology | Admitting: Obstetrics & Gynecology

## 2024-04-02 ENCOUNTER — Ambulatory Visit: Admitting: Obstetrics & Gynecology

## 2024-04-02 VITALS — BP 127/80 | HR 86 | Ht 64.0 in | Wt 235.0 lb

## 2024-04-02 DIAGNOSIS — Z01411 Encounter for gynecological examination (general) (routine) with abnormal findings: Secondary | ICD-10-CM | POA: Diagnosis present

## 2024-04-02 DIAGNOSIS — N9089 Other specified noninflammatory disorders of vulva and perineum: Secondary | ICD-10-CM

## 2024-04-02 DIAGNOSIS — Z1272 Encounter for screening for malignant neoplasm of vagina: Secondary | ICD-10-CM

## 2024-04-02 DIAGNOSIS — A630 Anogenital (venereal) warts: Secondary | ICD-10-CM

## 2024-04-02 DIAGNOSIS — Z1151 Encounter for screening for human papillomavirus (HPV): Secondary | ICD-10-CM | POA: Diagnosis not present

## 2024-04-02 NOTE — Progress Notes (Signed)
 Patient ID: [name], female   DOB: Apr 09, 1961, 63 y.o.   MRN: [id number]  Chief Complaint  Patient presents with   Procedure    HPI [name] is a 63 y.o. female.   HPI Indication: fibroepithelial polyp of the right vulva Symptoms:   none, irritating  Location:  labia majora on the right  Past Medical History:  Diagnosis Date   Anxiety    Arthritis    Asthma    Cancer (HCC)    cervical cancer; pt states a long long time ago   CHF (congestive heart failure) (HCC)    Coronary artery disease    Depression    Diabetes mellitus without complication (HCC)    Dyspnea    Edema of all four extremities    Essential (primary) hypertension 06/24/2020   Hyperlipemia    Type II diabetes mellitus (HCC) 03/22/2022    Past Surgical History:  Procedure Laterality Date   ABDOMINAL HYSTERECTOMY     ANTERIOR LAT LUMBAR FUSION N/A 11/05/2019   Procedure: Lumbar Four-Five Anterolateral lumbar interbody fusion with lateral plate;  Surgeon: Colon Shove, MD;  Location: [facility];  Service: Neurosurgery;  Laterality: N/A;  anterolateral   BACK SURGERY     pins   BACK SURGERY  07/11/2008   BIOPSY  06/29/2021   Procedure: BIOPSY;  Surgeon: Eartha Angelia Sieving, MD;  Location: [facility];  Service: Gastroenterology;;   BLADDER REPAIR     CARPAL TUNNEL RELEASE Right    CESAREAN SECTION  1982, 1984   COLONOSCOPY N/A 06/12/2013   rehman: normal   COLONOSCOPY WITH PROPOFOL  N/A 06/27/2023   Procedure: COLONOSCOPY WITH PROPOFOL ;  Surgeon: Cinderella Deatrice FALCON, MD;  Location: [facility];  Service: Endoscopy;  Laterality: N/A;  2:00PM;ASA 3   CORONARY PRESSURE/FFR STUDY N/A 10/20/2022   Procedure: CORONARY PRESSURE/FFR STUDY;  Surgeon: Wendel Lurena POUR, MD;  Location: [facility];  Service: Cardiovascular;  Laterality: N/A;   CYSTOSCOPY WITH INJECTION N/A 03/23/2023   Procedure: CYSTOSCOPY WITH INJECTION-botox ;  Surgeon: Sherrilee Belvie CROME, MD;  Location: [facility];  Service:  Urology;  Laterality: N/A;   ESOPHAGOGASTRODUODENOSCOPY (EGD) WITH PROPOFOL  N/A 06/29/2021   Procedure: ESOPHAGOGASTRODUODENOSCOPY (EGD) WITH PROPOFOL ;  Surgeon: Eartha Angelia Sieving, MD;  [facility]: [facility];  Service: Gastroenterology;  Laterality: N/A;  8:40   KNEE ARTHROSCOPY WITH MEDIAL MENISECTOMY Right 05/09/2019   Procedure: KNEE ARTHROSCOPY WITH MEDIAL MENISECTOMY AND LATERAL MENISECTOMY;  Surgeon: Margrette Taft BRAVO, MD;  Location: [facility];  Service: Orthopedics;  Laterality: Right;   KNEE ARTHROSCOPY WITH MEDIAL MENISECTOMY Left 06/27/2019   Procedure: KNEE ARTHROSCOPY WITH MEDIAL MENISCECTOMY;  Surgeon: Margrette Taft BRAVO, MD;  Location: [facility];  Service: Orthopedics;  Laterality: Left;   LEFT HEART CATH AND CORONARY ANGIOGRAPHY N/A 10/20/2022   Procedure: LEFT HEART CATH AND CORONARY ANGIOGRAPHY;  Surgeon: Wendel Lurena POUR, MD;  [facility];  Service: Cardiovascular;  Laterality: N/A;   POLYPECTOMY  06/27/2023   Procedure: POLYPECTOMY;  Surgeon: Cinderella Deatrice FALCON, MD;  [facility];  Service: Endoscopy;;   TOTAL KNEE ARTHROPLASTY Left 03/15/2022   Procedure: TOTAL KNEE ARTHROPLASTY;  Surgeon: Margrette Taft BRAVO, MD;  Location: [facility];  Service: Orthopedics;  Laterality: Left;   TOTAL KNEE ARTHROPLASTY Right 02/21/2023   Procedure: TOTAL KNEE ARTHROPLASTY;  Surgeon: Margrette Taft BRAVO, MD;  Location: [facility];  Service: Orthopedics;  Laterality: Right;    Family History  Problem Relation Age of Onset  Cancer Paternal Grandfather    Cancer Paternal Grandmother    Cancer Maternal Grandmother    Heart disease Father    Lung cancer Mother    Other Brother        gunshot   Colon cancer Other     Social History Social History   Tobacco Use   Smoking status: Former    Current packs/day: 1.00    Average packs/day: 1 pack/day for 20.0 years (20.0 ttl pk-yrs)    Types: Cigarettes   Smokeless tobacco: Never   Tobacco comments:    trying to  quit  Vaping Use   Vaping status: Former  Substance Use Topics   Alcohol use: No    Alcohol/week: 0.0 standard drinks of alcohol   Drug use: No    No Known Allergies  Current Outpatient Medications  Medication Sig Dispense Refill   acetaminophen  (TYLENOL ) 500 MG tablet Take 500 mg by mouth every 6 (six) hours as needed for moderate pain (pain score 4-6).     albuterol  (VENTOLIN  HFA) 108 (90 Base) MCG/ACT inhaler Inhale 1-2 puffs into the lungs every 6 (six) hours as needed for wheezing or shortness of breath.     ARIPiprazole  (ABILIFY ) 2 MG tablet Take 2 mg by mouth daily.     aspirin  81 MG chewable tablet Chew 1 tablet (81 mg total) by mouth 2 (two) times daily. 90 tablet 0   atorvastatin  (LIPITOR) 80 MG tablet Take 0.5 tablets (40 mg total) by mouth at bedtime. 90 tablet 3   Empagliflozin -metFORMIN  HCl ER (SYNJARDY  XR) 12.11-998 MG TB24 Take 1 tablet by mouth daily.     estradiol  (ESTRACE ) 2 MG tablet TAKE ONE TABLET AT BEDTIME 30 tablet 11   fluconazole  (DIFLUCAN ) 150 MG tablet Take 1 now and 1 in days, do not Lipitor while taking diflucan  2 tablet 1   FLUoxetine  (PROZAC ) 40 MG capsule Take 40 mg by mouth daily.     gabapentin  (NEURONTIN ) 300 MG capsule Take 1 capsule (300 mg total) by mouth 3 (three) times daily. 21 capsule 3   loratadine  (CLARITIN ) 10 MG tablet Take 10 mg by mouth daily.     oxyCODONE -acetaminophen  (PERCOCET) 7.5-325 MG tablet Take 1 tablet by mouth 4 (four) times daily.     potassium chloride  SA (KLOR-CON  M) 20 MEQ tablet Take 1 tablet (20 mEq total) by mouth daily. 90 tablet 3   spironolactone  (ALDACTONE ) 25 MG tablet Take 0.5 tablets (12.5 mg total) by mouth daily. 90 tablet 3   torsemide  (DEMADEX ) 20 MG tablet Take 2 tablets (40 mg total) by mouth daily. 90 tablet 3   traZODone  (DESYREL ) 100 MG tablet Take 200 mg by mouth at bedtime.     nitroGLYCERIN  (NITROSTAT ) 0.4 MG SL tablet Place 1 tablet (0.4 mg total) under the tongue every 5 (five) minutes as needed for  chest pain. 25 tablet 3   No current facility-administered medications for this visit.    Review of Systems Review of Systems  Blood pressure 127/80, pulse 86, height 5' 4 (1.626 m), weight 235 lb (106.6 kg).  Physical Exam Physical Exam  Data Reviewed   Assessment    Prepping with Betadine  Local anesthesia with 12Buffered Lidocaine  Ellptical incision made with 11 blade Total excision 3-0 ethilon closure 5 sutures placed Well tolerated  Specimen appropriately identified and sent to pathology   Plan    Follow-up:  2 weeks      Vonn VEAR Inch 04/02/2024, 10:39 AM

## 2024-04-04 LAB — CYTOLOGY - PAP
Comment: NEGATIVE
Diagnosis: NEGATIVE
High risk HPV: NEGATIVE

## 2024-04-04 LAB — SURGICAL PATHOLOGY

## 2024-04-08 NOTE — Patient Instructions (Signed)
 [name]  04/08/2024     @PREFPERIOPPHARMACY @   Your procedure is scheduled on  04/15/2024.   Report to [facility] at  0600  A.M.   Call this number if you have problems the morning of surgery:  [phone]  If you experience any cold or flu symptoms such as cough, fever, chills, shortness of breath, etc. between now and your scheduled surgery, please notify us  at the above number.   Remember:        Your last dose of synjardy  should be on 04/11/2024.   Do not eat after midnight.   You may drink clear liquids until 0330 am on 04/15/2024.       Clear liquids allowed are:                    Water , Juice (No red color; non-citric and without pulp; diabetics please choose diet or no sugar options), Carbonated beverages (diabetics please choose diet or no sugar options), Clear Tea (No creamer, milk, or cream, including half & half and powdered creamer), Black Coffee Only (No creamer, milk or cream, including half & half and powdered creamer), and Clear Sports drink (No red color; diabetics please choose diet or no sugar options)    Take these medicines the morning of surgery with A SIP OF WATER                 abilify , fluoxetine , gabapentin , oxycodone (if needed).    Do not wear jewelry, make-up or nail polish, including gel polish,  artificial nails, or any other type of covering on natural nails (fingers and  toes).  Do not wear lotions, powders, or perfumes, or deodorant.  Do not shave 48 hours prior to surgery.  Men may shave face and neck.  Do not bring valuables to the hospital.  [facility] is not responsible for any belongings or valuables.  Contacts, dentures or bridgework may not be worn into surgery.  Leave your suitcase in the car.  After surgery it may be brought to your room.  For patients admitted to the hospital, discharge time will be determined by your treatment team.  Patients discharged the day of surgery will not be allowed to drive home  and must have someone with them for 24 hours.    Special instructions:   DO NOT smoke tobacco or vape for 24 hours before your procedure.  Please read over the following fact sheets that you were given. Coughing and Deep Breathing, Surgical Site Infection Prevention, Anesthesia Post-op Instructions, and Care and Recovery After Surgery      Breast Cyst Removal, Care After The following information offers guidance on how to care for yourself after your procedure. Your health care provider may also give you more specific instructions. If you have problems or questions, contact your health care provider. What can I expect after the procedure? After the procedure, it is common to have: Some pain or redness at the incision site. Breast swelling. Breast tenderness. Follow these instructions at home: Incision care  Follow instructions from your health care provider about how to take care of your incision. Make sure you: Wash your hands with soap and water  for at least 20 seconds before and after you change your bandage (dressing). If soap and water  are not available, use hand sanitizer. Change your dressing as told by your health care provider. Leave stitches (sutures), skin glue, or adhesive strips in place. These skin closures  may need to stay in place for 2 weeks or longer. If adhesive strip edges start to loosen and curl up, you may trim the loose edges. Do not remove adhesive strips completely unless your health care provider tells you to do that. Check your incision area every day for signs of infection. Check for: More redness, swelling, or pain. Fluid or blood. Warmth. Pus or a bad smell. Managing pain and swelling  If directed, put ice on the breast. To do this: Put ice in a plastic bag. Place a towel between your skin and the bag. Leave the ice on for 20 minutes, 2-3 times a day. If your skin turns bright red, remove the ice right away to prevent skin damage. The risk of skin  damage is higher if you cannot feel pain, heat, or cold. General instructions Take over-the-counter and prescription medicines only as told by your health care provider. Return to your normal activities as told by your health care provider. Ask your health care provider what activities are safe for you. Do not take baths, swim, or use a hot tub until your health care provider approves. Ask your health care provider if you may take showers. You may only be allowed to take sponge baths. If you were given a sedative during the procedure, it can affect you for several hours. Do not drive or operate machinery until your health care provider says that it is safe. Keep all follow-up visits. Contact a health care provider if: You have chills or a fever. Your medicine is not controlling your pain. You have any of these signs of infection at your incision: More redness, swelling, or pain. Fluid or blood. Warmth. Pus or a bad smell. Summary After breast cyst removal, it is common to have tenderness and swelling. Follow instructions for changing your bandage (dressing). Do not take baths, swim, or use a hot tub until your health care provider approves. Check your incision area every day for signs of infection. Contact your health care provider if you have pain, fever, or other signs of infection. This information is not intended to replace advice given to you by your health care provider. Make sure you discuss any questions you have with your health care provider. Document Revised: 09/08/2021 Document Reviewed: 09/08/2021 Elsevier Patient Education  2024 Elsevier Inc.General Anesthesia, Adult, Care After The following information offers guidance on how to care for yourself after your procedure. Your health care provider may also give you more specific instructions. If you have problems or questions, contact your health care provider. What can I expect after the procedure? After the procedure, it is  common for people to: Have pain or discomfort at the IV site. Have nausea or vomiting. Have a sore throat or hoarseness. Have trouble concentrating. Feel cold or chills. Feel weak, sleepy, or tired (fatigue). Have soreness and body aches. These can affect parts of the body that were not involved in surgery. Follow these instructions at home: For the time period you were told by your health care provider:  Rest. Do not participate in activities where you could fall or become injured. Do not drive or use machinery. Do not drink alcohol. Do not take sleeping pills or medicines that cause drowsiness. Do not make important decisions or sign legal documents. Do not take care of children on your own. General instructions Drink enough fluid to keep your urine pale yellow. If you have sleep apnea, surgery and certain medicines can increase your risk for breathing problems. Follow  instructions from your health care provider about wearing your sleep device: Anytime you are sleeping, including during daytime naps. While taking prescription pain medicines, sleeping medicines, or medicines that make you drowsy. Return to your normal activities as told by your health care provider. Ask your health care provider what activities are safe for you. Take over-the-counter and prescription medicines only as told by your health care provider. Do not use any products that contain nicotine or tobacco. These products include cigarettes, chewing tobacco, and vaping devices, such as e-cigarettes. These can delay incision healing after surgery. If you need help quitting, ask your health care provider. Contact a health care provider if: You have nausea or vomiting that does not get better with medicine. You vomit every time you eat or drink. You have pain that does not get better with medicine. You cannot urinate or have bloody urine. You develop a skin rash. You have a fever. Get help right away if: You have  trouble breathing. You have chest pain. You vomit blood. These symptoms may be an emergency. Get help right away. Call 911. Do not wait to see if the symptoms will go away. Do not drive yourself to the hospital. Summary After the procedure, it is common to have a sore throat, hoarseness, nausea, vomiting, or to feel weak, sleepy, or fatigue. For the time period you were told by your health care provider, do not drive or use machinery. Get help right away if you have difficulty breathing, have chest pain, or vomit blood. These symptoms may be an emergency. This information is not intended to replace advice given to you by your health care provider. Make sure you discuss any questions you have with your health care provider. Document Revised: 09/24/2021 Document Reviewed: 09/24/2021 Elsevier Patient Education  2024 Elsevier Inc.How to Use Chlorhexidine  at Home in the Shower Chlorhexidine  gluconate (CHG) is a germ-killing (antiseptic) wash that's used to clean the skin. It can get rid of the germs that normally live on the skin and can keep them away for about 24 hours. If you're having surgery, you may be told to shower with CHG at home the night before surgery. This can help lower your risk for infection. To use CHG wash in the shower, follow the steps below. Supplies needed: CHG body wash. Clean washcloth. Clean towel. How to use CHG in the shower Follow these steps unless you're told to use CHG in a different way: Start the shower. Use your normal soap and shampoo to wash your face and hair. Turn off the shower or move out of the shower stream. Pour CHG onto a clean washcloth. Do not use any type of brush or rough sponge. Start at your neck, washing your body down to your toes. Make sure you: Wash the part of your body where the surgery will be done for at least 1 minute. Do not scrub. Do not use CHG on your head or face unless your health care provider tells you to. If it gets into  your ears or eyes, rinse them well with water . Do not wash your genitals with CHG. Wash your back and under your arms. Make sure to wash skin folds. Let the CHG sit on your skin for 1-2 minutes or as long as told. Rinse your entire body in the shower, including all body creases and folds. Turn off the shower. Dry off with a clean towel. Do not put anything on your skin afterward, such as powder, lotion, or perfume. Put on  clean clothes or pajamas. If it's the night before surgery, sleep in clean sheets. General tips Use CHG only as told, and follow the instructions on the label. Use the full amount of CHG as told. This is often one bottle. Do not smoke and stay away from flames after using CHG. Your skin may feel sticky after using CHG. This is normal. The sticky feeling will go away as the CHG dries. Do not use CHG: If you have a chlorhexidine  allergy or have reacted to chlorhexidine  in the past. On open wounds or areas of skin that have broken skin, cuts, or scrapes. On babies younger than 35 months of age. Contact a health care provider if: You have questions about using CHG. Your skin gets irritated or itchy. You have a rash after using CHG. You swallow any CHG. Call your local poison control center [phone] in the [place] Your eyes itch badly, or they become very red or swollen. Your hearing changes. You have trouble seeing. If you can't reach your provider, go to an urgent care or emergency room. Do not drive yourself. Get help right away if: You have swelling or tingling in your mouth or throat. You make high-pitched whistling sounds when you breathe, most often when you breathe out (wheeze). You have trouble breathing. These symptoms may be an emergency. Call 911 right away. Do not wait to see if the symptoms will go away. Do not drive yourself to the hospital. This information is not intended to replace advice given to you by your health care provider. Make sure you  discuss any questions you have with your health care provider. Document Revised: 01/10/2023 Document Reviewed: 01/06/2022 Elsevier Patient Education  2024 [employer].

## 2024-04-09 ENCOUNTER — Encounter (HOSPITAL_COMMUNITY)
Admission: RE | Admit: 2024-04-09 | Discharge: 2024-04-09 | Disposition: A | Discharge status: Home / Self Care | Source: Ambulatory Visit | Attending: Surgery | Admitting: Surgery

## 2024-04-09 ENCOUNTER — Encounter (HOSPITAL_COMMUNITY): Payer: Self-pay

## 2024-04-09 VITALS — BP 105/60 | HR 76 | Resp 18 | Ht 64.0 in | Wt 235.0 lb

## 2024-04-09 DIAGNOSIS — Z01812 Encounter for preprocedural laboratory examination: Secondary | ICD-10-CM | POA: Insufficient documentation

## 2024-04-09 DIAGNOSIS — E11621 Type 2 diabetes mellitus with foot ulcer: Secondary | ICD-10-CM | POA: Diagnosis not present

## 2024-04-09 DIAGNOSIS — L97509 Non-pressure chronic ulcer of other part of unspecified foot with unspecified severity: Secondary | ICD-10-CM | POA: Insufficient documentation

## 2024-04-09 LAB — BASIC METABOLIC PANEL WITH GFR
Anion gap: 12 (ref 5–15)
BUN: 10 mg/dL (ref 8–23)
CO2: 21 mmol/L — ABNORMAL LOW (ref 22–32)
Calcium: 9.4 mg/dL (ref 8.9–10.3)
Chloride: 105 mmol/L (ref 98–111)
Creatinine, Ser: 0.76 mg/dL (ref 0.44–1.00)
GFR, Estimated: >60 mL/min (ref >60)
Glucose, Bld: 133 mg/dL — ABNORMAL HIGH (ref 70–99)
Potassium: 4 mmol/L (ref 3.5–5.1)
Sodium: 138 mmol/L (ref 135–145)

## 2024-04-09 LAB — HEMOGLOBIN A1C
Hgb A1c MFr Bld: 5.6 % (ref 4.8–5.6)
Mean Plasma Glucose: 114.02 mg/dL

## 2024-04-11 ENCOUNTER — Other Ambulatory Visit (HOSPITAL_COMMUNITY)

## 2024-04-15 ENCOUNTER — Ambulatory Visit (HOSPITAL_BASED_OUTPATIENT_CLINIC_OR_DEPARTMENT_OTHER): Payer: Self-pay | Admitting: Anesthesiology

## 2024-04-15 ENCOUNTER — Ambulatory Visit (HOSPITAL_COMMUNITY): Payer: Self-pay | Admitting: Anesthesiology

## 2024-04-15 ENCOUNTER — Encounter (HOSPITAL_COMMUNITY): Payer: Self-pay | Admitting: Surgery

## 2024-04-15 ENCOUNTER — Encounter (HOSPITAL_COMMUNITY)
Admission: RE | Disposition: A | Discharge status: Home / Self Care | Payer: Self-pay | Source: Home / Self Care | Attending: Surgery

## 2024-04-15 ENCOUNTER — Ambulatory Visit (HOSPITAL_COMMUNITY)
Admission: RE | Admit: 2024-04-15 | Discharge: 2024-04-15 | Disposition: A | Discharge status: Home / Self Care | Attending: Surgery | Admitting: Surgery

## 2024-04-15 DIAGNOSIS — I2510 Atherosclerotic heart disease of native coronary artery without angina pectoris: Secondary | ICD-10-CM | POA: Insufficient documentation

## 2024-04-15 DIAGNOSIS — Z7982 Long term (current) use of aspirin: Secondary | ICD-10-CM | POA: Diagnosis not present

## 2024-04-15 DIAGNOSIS — E119 Type 2 diabetes mellitus without complications: Secondary | ICD-10-CM | POA: Insufficient documentation

## 2024-04-15 DIAGNOSIS — I509 Heart failure, unspecified: Secondary | ICD-10-CM | POA: Diagnosis not present

## 2024-04-15 DIAGNOSIS — I110 Hypertensive heart disease with heart failure: Secondary | ICD-10-CM | POA: Diagnosis not present

## 2024-04-15 DIAGNOSIS — N6002 Solitary cyst of left breast: Secondary | ICD-10-CM

## 2024-04-15 DIAGNOSIS — Z780 Asymptomatic menopausal state: Secondary | ICD-10-CM | POA: Insufficient documentation

## 2024-04-15 DIAGNOSIS — G8929 Other chronic pain: Secondary | ICD-10-CM | POA: Insufficient documentation

## 2024-04-15 DIAGNOSIS — F17290 Nicotine dependence, other tobacco product, uncomplicated: Secondary | ICD-10-CM | POA: Insufficient documentation

## 2024-04-15 DIAGNOSIS — L720 Epidermal cyst: Secondary | ICD-10-CM | POA: Insufficient documentation

## 2024-04-15 DIAGNOSIS — Z90710 Acquired absence of both cervix and uterus: Secondary | ICD-10-CM | POA: Insufficient documentation

## 2024-04-15 DIAGNOSIS — F319 Bipolar disorder, unspecified: Secondary | ICD-10-CM | POA: Insufficient documentation

## 2024-04-15 DIAGNOSIS — E785 Hyperlipidemia, unspecified: Secondary | ICD-10-CM | POA: Insufficient documentation

## 2024-04-15 DIAGNOSIS — F419 Anxiety disorder, unspecified: Secondary | ICD-10-CM | POA: Insufficient documentation

## 2024-04-15 DIAGNOSIS — Z7984 Long term (current) use of oral hypoglycemic drugs: Secondary | ICD-10-CM | POA: Diagnosis not present

## 2024-04-15 HISTORY — PX: BREAST CYST EXCISION: SHX579

## 2024-04-15 LAB — GLUCOSE, CAPILLARY: Glucose-Capillary: 109 mg/dL — ABNORMAL HIGH (ref 70–99)

## 2024-04-15 SURGERY — EXCISION, CYST, BREAST
Anesthesia: General | Site: Breast | Laterality: Left

## 2024-04-15 MED ORDER — ASPIRIN 81 MG PO CHEW: 81.0000 mg | CHEWABLE_TABLET | Freq: Two times a day (BID) | ORAL | 0 refills | Status: DC

## 2024-04-15 MED ORDER — CHLORHEXIDINE GLUCONATE CLOTH 2 % EX PADS
6.0000 | MEDICATED_PAD | Freq: Once | CUTANEOUS | Status: DC
  Administered 2024-04-15: 6 via TOPICAL

## 2024-04-15 MED ORDER — IPRATROPIUM-ALBUTEROL 0.5-2.5 (3) MG/3ML IN SOLN
RESPIRATORY_TRACT | Status: AC
  Filled 2024-04-15: qty 3

## 2024-04-15 MED ORDER — CHLORHEXIDINE GLUCONATE 0.12 % MT SOLN: 15.0000 mL | Freq: Once | OROMUCOSAL | Status: DC

## 2024-04-15 MED ORDER — SCOPOLAMINE 1 MG/3DAYS TD PT72
1.0000 | MEDICATED_PATCH | Freq: Once | TRANSDERMAL | Status: DC
  Administered 2024-04-15: 1 mg via TRANSDERMAL
  Filled 2024-04-15: qty 1

## 2024-04-15 MED ORDER — ACETAMINOPHEN 500 MG PO TABS: 500.0000 mg | ORAL_TABLET | Freq: Four times a day (QID) | ORAL | 0 refills | Status: AC

## 2024-04-15 MED ORDER — FENTANYL CITRATE PF 50 MCG/ML IJ SOSY: 25.0000 ug | PREFILLED_SYRINGE | INTRAMUSCULAR | Status: DC | PRN

## 2024-04-15 MED ORDER — SODIUM CHLORIDE 0.9 % IR SOLN
Status: DC | PRN
  Administered 2024-04-15: 1000 mL

## 2024-04-15 MED ORDER — LACTATED RINGERS IV SOLN
INTRAVENOUS | Status: DC
Start: 2024-04-15 — End: 2024-04-15

## 2024-04-15 MED ORDER — ORAL CARE MOUTH RINSE: 15.0000 mL | Freq: Once | OROMUCOSAL | Status: DC

## 2024-04-15 MED ORDER — ONDANSETRON HCL 4 MG/2ML IJ SOLN
INTRAMUSCULAR | Status: DC | PRN
  Administered 2024-04-15: 4 mg via INTRAVENOUS

## 2024-04-15 MED ORDER — BUPIVACAINE HCL (PF) 0.5 % IJ SOLN
INTRAMUSCULAR | Status: DC | PRN
  Administered 2024-04-15: 10 mL
  Administered 2024-04-15: 20 mL

## 2024-04-15 MED ORDER — CEFAZOLIN SODIUM-DEXTROSE 2-4 GM/100ML-% IV SOLN
2.0000 g | INTRAVENOUS | Status: AC
  Administered 2024-04-15: 2 g via INTRAVENOUS
  Filled 2024-04-15: qty 100

## 2024-04-15 MED ORDER — MIDAZOLAM HCL 2 MG/2ML IJ SOLN
INTRAMUSCULAR | Status: DC | PRN
  Administered 2024-04-15: 2 mg via INTRAVENOUS

## 2024-04-15 MED ORDER — CHLORHEXIDINE GLUCONATE 0.12 % MT SOLN
15.0000 mL | Freq: Once | OROMUCOSAL | Status: DC
  Administered 2024-04-15: 15 mL via OROMUCOSAL

## 2024-04-15 MED ORDER — LACTATED RINGERS IV SOLN: INTRAVENOUS | Status: DC

## 2024-04-15 MED ORDER — CHLORHEXIDINE GLUCONATE CLOTH 2 % EX PADS
6.0000 | MEDICATED_PAD | Freq: Once | CUTANEOUS | Status: AC
  Administered 2024-04-15: 6 via TOPICAL

## 2024-04-15 MED ORDER — OXYCODONE HCL 5 MG PO TABS: 5.0000 mg | ORAL_TABLET | Freq: Once | ORAL | Status: DC | PRN

## 2024-04-15 MED ORDER — IPRATROPIUM-ALBUTEROL 0.5-2.5 (3) MG/3ML IN SOLN
3.0000 mL | Freq: Once | RESPIRATORY_TRACT | Status: AC
  Administered 2024-04-15: 3 mL via RESPIRATORY_TRACT

## 2024-04-15 MED ORDER — OXYCODONE HCL 5 MG/5ML PO SOLN: 5.0000 mg | Freq: Once | ORAL | Status: DC | PRN

## 2024-04-15 MED ORDER — PROPOFOL 10 MG/ML IV BOLUS
INTRAVENOUS | Status: DC | PRN
  Administered 2024-04-15: 150 mg via INTRAVENOUS

## 2024-04-15 MED ORDER — ORAL CARE MOUTH RINSE
15.0000 mL | Freq: Once | OROMUCOSAL | Status: DC
Start: 2024-04-15 — End: 2024-04-15

## 2024-04-15 MED ORDER — CHLORHEXIDINE GLUCONATE 0.12 % MT SOLN
OROMUCOSAL | Status: AC
  Filled 2024-04-15: qty 15

## 2024-04-15 MED ORDER — MIDAZOLAM HCL 2 MG/2ML IJ SOLN
INTRAMUSCULAR | Status: AC
  Filled 2024-04-15: qty 2

## 2024-04-15 MED ORDER — FENTANYL CITRATE (PF) 100 MCG/2ML IJ SOLN
INTRAMUSCULAR | Status: DC | PRN
  Administered 2024-04-15: 100 ug via INTRAVENOUS

## 2024-04-15 MED ORDER — FENTANYL CITRATE (PF) 100 MCG/2ML IJ SOLN
INTRAMUSCULAR | Status: AC
  Filled 2024-04-15: qty 2

## 2024-04-15 MED ORDER — BUPIVACAINE HCL (PF) 0.5 % IJ SOLN
INTRAMUSCULAR | Status: AC
  Filled 2024-04-15: qty 30

## 2024-04-15 MED ORDER — PROPOFOL 10 MG/ML IV BOLUS
INTRAVENOUS | Status: AC
  Filled 2024-04-15: qty 20

## 2024-04-15 MED ORDER — DEXAMETHASONE SODIUM PHOSPHATE 10 MG/ML IJ SOLN
INTRAMUSCULAR | Status: DC | PRN
  Administered 2024-04-15: 5 mg via INTRAVENOUS

## 2024-04-15 MED ORDER — SODIUM CHLORIDE 0.9 % IV SOLN: 12.5000 mg | INTRAVENOUS | Status: DC | PRN

## 2024-04-15 SURGICAL SUPPLY — 24 items
BINDER BREAST XLRG (GAUZE/BANDAGES/DRESSINGS) IMPLANT
CLOTH BEACON ORANGE TIMEOUT ST (SAFETY) ×2 IMPLANT
COVER LIGHT HANDLE (MISCELLANEOUS) IMPLANT
DECANTER SPIKE VIAL GLASS SM (MISCELLANEOUS) ×2 IMPLANT
DERMABOND ADVANCED .7 DNX12 (GAUZE/BANDAGES/DRESSINGS) IMPLANT
ELECT NDL TIP 2.8 STRL (NEEDLE) ×2 IMPLANT
ELECT NEEDLE TIP 2.8 STRL (NEEDLE) ×1 IMPLANT
ELECTRODE REM PT RTRN 9FT ADLT (ELECTROSURGICAL) ×2 IMPLANT
GLOVE BIOGEL PI IND STRL 6.5 (GLOVE) ×2 IMPLANT
GLOVE BIOGEL PI IND STRL 7.0 (GLOVE) ×4 IMPLANT
GLOVE SURG SS PI 6.5 STRL IVOR (GLOVE) ×2 IMPLANT
GOWN STRL REUS W/TWL LRG LVL3 (GOWN DISPOSABLE) ×4 IMPLANT
KIT TURNOVER KIT A (KITS) ×2 IMPLANT
MANIFOLD NEPTUNE II (INSTRUMENTS) ×2 IMPLANT
NDL HYPO 25X1 1.5 SAFETY (NEEDLE) ×2 IMPLANT
NEEDLE HYPO 25X1 1.5 SAFETY (NEEDLE) ×1 IMPLANT
NS IRRIG 1000ML POUR BTL (IV SOLUTION) ×2 IMPLANT
PACK MINOR (CUSTOM PROCEDURE TRAY) IMPLANT
PAD ARMBOARD POSITIONER FOAM (MISCELLANEOUS) ×2 IMPLANT
POSITIONER HEAD 8X9X4 ADT (SOFTGOODS) ×2 IMPLANT
SET BASIN LINEN APH (SET/KITS/TRAYS/PACK) ×2 IMPLANT
SUT MNCRL AB 4-0 PS2 18 (SUTURE) IMPLANT
SUT VIC AB 3-0 SH 27X BRD (SUTURE) IMPLANT
SYR CONTROL 10ML LL (SYRINGE) ×2 IMPLANT

## 2024-04-15 NOTE — Interval H&P Note (Signed)
 History and Physical Interval Note:  04/15/2024 7:18 AM  [name] Na  has presented today for surgery, with the diagnosis of RECURRENT BREAST CYST, LEFT.  The various methods of treatment have been discussed with the patient and family. After consideration of risks, benefits and other options for treatment, the patient has consented to  Procedure(s): EXCISION, CYST, BREAST (Left) as a surgical intervention.  The patient's history has been reviewed, patient examined, no change in status, stable for surgery.  I have reviewed the patient's chart and labs.  Questions were answered to the patient's satisfaction.     [name]

## 2024-04-15 NOTE — Anesthesia Postprocedure Evaluation (Signed)
 Anesthesia Post Note  Patient: [name]  Procedure(s) Performed: EXCISION, CYST, BREAST (Left: Breast)  Patient location during evaluation: PACU Anesthesia Type: General Level of consciousness: awake and alert Pain management: pain level controlled Vital Signs Assessment: post-procedure vital signs reviewed and stable Respiratory status: spontaneous breathing, nonlabored ventilation, respiratory function stable and patient connected to nasal cannula oxygen Cardiovascular status: blood pressure returned to baseline and stable Postop Assessment: no apparent nausea or vomiting Anesthetic complications: no   No notable events documented.   Last Vitals:  Vitals:   04/15/24 0830 04/15/24 0845  BP: 113/62 126/70  Pulse: 61 73  Resp: 15 15  Temp:    SpO2: 97% 94%    Last Pain:  Vitals:   04/15/24 0845  PainSc: 0-No pain                 Andrea Limes

## 2024-04-15 NOTE — Addendum Note (Signed)
 Addendum  created 04/15/24 0912 by Cordella Elvie HERO, CRNA   Intraprocedure Meds edited

## 2024-04-15 NOTE — Progress Notes (Signed)
 [facility]  Spoke with the patient's husband in the consultation room.  I explained that she tolerated the procedure without difficulty.  She has dissolvable stitches under the skin with overlying skin glue.  This will flake off in 10 to 14 days.  I also want her taking scheduled Tylenol .  If they take the narcotic pain medication, they should take a stool softener as well.  The patient will follow-up with me in 2 weeks.  All questions were answered to his expressed satisfaction.  Dorothyann Brittle, DO [facility] [address] [place] [address], [place] [address] [phone] (office)

## 2024-04-15 NOTE — Discharge Instructions (Signed)
 Ambulatory Surgery Discharge Instructions  General Anesthesia or Sedation Do not drive or operate heavy machinery for 24 hours.  Do not consume alcohol, tranquilizers, sleeping medications, or any non-prescribed medications for 24 hours. Do not make important decisions or sign any important papers in the next 24 hours. You should have someone with you tonight at home.  Activity  You are advised to go directly home from the hospital.  Restrict your activities and rest for a day.  Resume light activity tomorrow. No heavy lifting over 10 lbs or strenuous exercise. Wear your chest binder or compression bra/sports bra at all times while up and moving for the next 1-2 weeks.  Fluids and Diet Begin with clear liquids, bouillon, dry toast, soda crackers.  If not nauseated, you may go to a regular diet when you desire.  Greasy and spicy foods are not advised.  Medications  If you have not had a bowel movement in 24 hours, take 2 tablespoons over the counter Milk of mag.             You May resume your blood thinners tomorrow (Aspirin , coumadin, or other).  You are being discharged with prescriptions for Opioid/Narcotic Medications: There are some specific considerations for these medications that you should know. Opioid Meds have risks & benefits. Addiction to these meds is always a concern with prolonged use Take medication only as directed Do not drive while taking narcotic pain medication Do not crush tablets or capsules Do not use a different container than medication was dispensed in Lock the container of medication in a cool, dry place out of reach of children and pets. Opioid medication can cause addiction Do not share with anyone else (this is a felony) Do not store medications for future use. Dispose of them properly.     Disposal:  Find a [place]  household drug take back site near you.  If you can't get to a drug take back site, use the recipe below as a last resort to dispose of  expired, unused or unwanted drugs. Disposal  (Do not dispose chemotherapy drugs this way, talk to your prescribing doctor instead.) Step 1: Mix drugs (do not crush) with dirt, kitty litter, or used coffee grounds and add a small amount of water  to dissolve any solid medications. Step 2: Seal drugs in plastic bag. Step 3: Place plastic bag in trash. Step 4: Take prescription container and scratch out personal information, then recycle or throw away.  Operative Site  You have a liquid bandage over your incisions, this will begin to flake off in about a week. Ok to English as a second language teacher. Keep wound clean and dry. No baths or swimming. No lifting more than 10 pounds.  Contact Information: If you have questions or concerns, please call our office, [phone], Monday- Thursday 8AM-5PM and Friday 8AM-12Noon.  If it is after hours or on the weekend, please call [facility] Main Number, [phone], and ask to speak to the surgeon on call for Dr. Evonnie at [facility].   SPECIFIC COMPLICATIONS TO WATCH FOR: Inability to urinate Fever over 101? F by mouth Nausea and vomiting lasting longer than 24 hours. Pain not relieved by medication ordered Swelling around the operative site Increased redness, warmth, hardness, around operative area Numbness, tingling, or cold fingers or toes Blood -soaked dressing, (small amounts of oozing may be normal) Increasing and progressive drainage from surgical area or exam site

## 2024-04-15 NOTE — Anesthesia Procedure Notes (Signed)
 Procedure Name: LMA Insertion Date/Time: 04/15/2024 7:43 AM  Performed by: Cordella Elvie HERO, CRNAPre-anesthesia Checklist: Patient identified, Emergency Drugs available, Suction available, Patient being monitored and Timeout performed Patient Re-evaluated:Patient Re-evaluated prior to induction Oxygen Delivery Method: Circle system utilized Preoxygenation: Pre-oxygenation with 100% oxygen Induction Type: IV induction LMA: LMA inserted LMA Size: 4.0 Number of attempts: 1 Placement Confirmation: positive ETCO2, CO2 detector and breath sounds checked- equal and bilateral Tube secured with: Tape Dental Injury: Teeth and Oropharynx as per pre-operative assessment

## 2024-04-15 NOTE — Anesthesia Preprocedure Evaluation (Addendum)
 Anesthesia Evaluation  Patient identified by MRN, date of birth, ID band Patient awake    Reviewed: Allergy & Precautions, H&P , NPO status , Patient's Chart, lab work & pertinent test results  Airway Mallampati: II  TM Distance: >3 FB Neck ROM: Full    Dental  (+) Edentulous Upper, Edentulous Lower   Pulmonary shortness of breath, asthma , Patient abstained from smoking., former smoker   + rhonchi  + wheezing      Cardiovascular hypertension, + CAD and +CHF  Normal cardiovascular exam Rhythm:Regular Rate:Normal  Ef 60-65%   Neuro/Psych  PSYCHIATRIC DISORDERS Anxiety Depression Bipolar Disorder    Neuromuscular disease    GI/Hepatic Neg liver ROS,GERD  ,,  Endo/Other  diabetes    Renal/GU negative Renal ROS  negative genitourinary   Musculoskeletal  (+) Arthritis ,    Abdominal   Peds negative pediatric ROS (+)  Hematology negative hematology ROS (+)   Anesthesia Other Findings   Reproductive/Obstetrics negative OB ROS                              Anesthesia Physical Anesthesia Plan  ASA: 3  Anesthesia Plan: General   Post-op Pain Management:    Induction: Intravenous  PONV Risk Score and Plan:   Airway Management Planned: Oral ETT and LMA  Additional Equipment:   Intra-op Plan:   Post-operative Plan: Extubation in OR  Informed Consent: I have reviewed the patients History and Physical, chart, labs and discussed the procedure including the risks, benefits and alternatives for the proposed anesthesia with the patient or authorized representative who has indicated his/her understanding and acceptance.     Dental advisory given  Plan Discussed with: CRNA  Anesthesia Plan Comments:          Anesthesia Quick Evaluation

## 2024-04-15 NOTE — Transfer of Care (Signed)
 Immediate Anesthesia Transfer of Care Note  Patient: [name]  Procedure(s) Performed: EXCISION, CYST, BREAST (Left: Breast)  Patient Location: PACU  Anesthesia Type:General  Level of Consciousness: awake, alert , oriented, and patient cooperative  Airway & Oxygen Therapy: Patient Spontanous Breathing and Patient connected to nasal cannula oxygen  Post-op Assessment: Report given to RN, Post -op Vital signs reviewed and stable, and Patient moving all extremities X 4  Post vital signs: Reviewed and stable  Last Vitals:  Vitals Value Taken Time  BP 113/62 04/15/24 08:30  Temp 36.2 C 04/15/24 08:20  Pulse 62 04/15/24 08:31  Resp 15 04/15/24 08:31  SpO2 97 % 04/15/24 08:31  Vitals shown include unfiled device data.  Last Pain:  Vitals:   04/15/24 0641  PainSc: 0-No pain         Complications: No notable events documented.

## 2024-04-15 NOTE — Op Note (Signed)
 [facility] Operative Note  04/15/24  Preoperative Diagnosis: Left breast cyst   Postoperative Diagnosis: Same   Procedure(s) Performed: Excision of left breast cyst   Surgeon: Dorothyann Brittle, DO    Assistants: No qualified resident was available    Anesthesia: LMA   Anesthesiologist: Herschell Hollering, MD    Specimens: Left breast cyst   Estimated Blood Loss: Minimal   Blood Replacement: None    Complications: None   Wound Class: Clean   Operative Indications: Patient is a 63 year old female who presents for excision of a left breast cyst.  This area intermittently gets infected and will drain on its own.  She has also received numerous doses of antibiotics for this previously infected cyst.  She is agreeable to surgery at this time.  Findings: -Left breast cyst at the 6 to 7 o'clock position, completely excised.  No evidence of infection -Hemostasis noted at completion of case   Procedure: The patient was taken to the operating room and placed supine. LMA anesthesia was induced. Intravenous antibiotics were administered per protocol.  The left breast was prepared and draped in the usual sterile fashion.  A time-out was completed verifying correct patient, procedure, site, positioning, and implant(s) and/or special equipment prior to beginning this procedure.  A elliptical incision was made overlying the left breast cyst. Using electrocautery, the dermis and subcutaneous tissues dissected around the cyst. The cyst was removed in its entirety and sent to pathology for evaluation.  Hemostasis was achieved using electrocautery. The dermis was approximated using 3-0 Vicryl sutures. The skin was closed using a running 4-0 Monocryl subcuticular stitch.  Dermabond was applied.  A chest compression bra was placed.  Final inspection revealed acceptable hemostasis. All counts were correct at the end of the case. The patient was awakened from anesthesia without  complication.  The patient went to the PACU in stable condition.   Dorothyann Brittle, DO  [facility] [address] [place], [place] [address] [phone] (office)

## 2024-04-16 LAB — SURGICAL PATHOLOGY

## 2024-04-18 ENCOUNTER — Ambulatory Visit: Admitting: Obstetrics & Gynecology

## 2024-04-18 ENCOUNTER — Encounter: Payer: Self-pay | Admitting: Obstetrics & Gynecology

## 2024-04-18 VITALS — BP 121/73 | HR 76 | Ht 64.0 in | Wt 235.0 lb

## 2024-04-18 DIAGNOSIS — Z4802 Encounter for removal of sutures: Secondary | ICD-10-CM

## 2024-04-18 DIAGNOSIS — Z9889 Other specified postprocedural states: Secondary | ICD-10-CM

## 2024-04-18 NOTE — Progress Notes (Signed)
  HPI: Patient returns for routine postoperative follow-up having undergone removal of right vulvar lesion which returned today as a sebaceous cyst with foreign body effect on April 02, 2024.  The patient's immediate postoperative recovery has been unremarkable. Patient has not had any problems doing well   Current Outpatient Medications: acetaminophen  (TYLENOL ) 500 MG tablet, Take 1 tablet (500 mg total) by mouth every 6 (six) hours for 7 days., Disp: 28 tablet, Rfl: 0 albuterol  (VENTOLIN  HFA) 108 (90 Base) MCG/ACT inhaler, Inhale 1-2 puffs into the lungs every 6 (six) hours as needed for wheezing or shortness of breath., Disp: , Rfl:  ARIPiprazole  (ABILIFY ) 2 MG tablet, Take 2 mg by mouth daily., Disp: , Rfl:   aspirin  81 MG chewable tablet, Chew 1 tablet (81 mg total) by mouth 2 (two) times daily., Disp: 90 tablet, Rfl: 0 atorvastatin  (LIPITOR) 80 MG tablet, Take 0.5 tablets (40 mg total) by mouth at bedtime., Disp: 90 tablet, Rfl: 3 Empagliflozin -metFORMIN  HCl ER (SYNJARDY  XR) 12.11-998 MG TB24, Take 1 tablet by mouth daily., Disp: , Rfl:  estradiol  (ESTRACE ) 2 MG tablet, TAKE ONE TABLET AT BEDTIME, Disp: 30 tablet, Rfl: 11 FLUoxetine  (PROZAC ) 40 MG capsule, Take 40 mg by mouth daily., Disp: , Rfl:   gabapentin  (NEURONTIN ) 300 MG capsule, Take 1 capsule (300 mg total) by mouth 3 (three) times daily., Disp: 21 capsule, Rfl: 3 loratadine  (CLARITIN ) 10 MG tablet, Take 10 mg by mouth daily., Disp: , Rfl:  nitroGLYCERIN  (NITROSTAT ) 0.4 MG SL tablet, Place 1 tablet (0.4 mg total) under the tongue every 5 (five) minutes as needed for chest pain., Disp: 25 tablet, Rfl: 3 oxyCODONE -acetaminophen  (PERCOCET) 7.5-325 MG tablet, Take 1 tablet by mouth 4 (four) times daily., Disp: , Rfl:  potassium chloride  SA (KLOR-CON  M) 20 MEQ tablet, Take 1 tablet (20 mEq total) by mouth daily., Disp: 90 tablet, Rfl: 3 spironolactone  (ALDACTONE ) 25 MG tablet, Take 0.5 tablets (12.5 mg total) by mouth daily., Disp:  90 tablet, Rfl: 3 traZODone  (DESYREL ) 100 MG tablet, Take 200 mg by mouth at bedtime., Disp: , Rfl:  fluconazole  (DIFLUCAN ) 150 MG tablet, Take 1 now and 1 in days, do not Lipitor while taking diflucan  (Patient not taking: Reported on 04/18/2024), Disp: 2 tablet, Rfl: 1 torsemide  (DEMADEX ) 20 MG tablet, Take 2 tablets (40 mg total) by mouth daily. (Patient not taking: Reported on 04/18/2024), Disp: 90 tablet, Rfl: 3  No current facility-administered medications for this visit.    Blood pressure 121/73, pulse 76, height 5' 4 (1.626 m), weight 235 lb (106.6 kg).  Physical Exam: The area is well-healed with no evidence of infection  Diagnostic Tests: Patient is doing  Pathology:  SURGICAL PATHOLOGY CASE: [id number] PATIENT: Ratasha Casanova Surgical Pathology Report     Clinical History: Recurrent breast cyst, left (las)     FINAL MICROSCOPIC DIAGNOSIS:  A. CYST, LEFT BREAST, EXCISION: - Epidermal inclusion cyst with focal rupture and associated foreign body giant cell reaction.    Impression + Management plan: S/P removal of a right vulvar lesion, no problems Suture removed today     Medications Prescribed this encounter: No orders of the defined types were placed in this encounter.     Follow up: No follow-ups on file.    Vonn VEAR Inch, MD Attending Physician for the [facility] 04/18/2024 9:34 AM

## 2024-04-24 ENCOUNTER — Encounter (INDEPENDENT_AMBULATORY_CARE_PROVIDER_SITE_OTHER): Payer: Self-pay | Admitting: Gastroenterology

## 2024-05-02 ENCOUNTER — Ambulatory Visit (INDEPENDENT_AMBULATORY_CARE_PROVIDER_SITE_OTHER): Admitting: Surgery

## 2024-05-02 ENCOUNTER — Encounter: Payer: Self-pay | Admitting: Surgery

## 2024-05-02 VITALS — BP 109/75 | HR 76 | Temp 97.8°F | Resp 18 | Ht 64.0 in | Wt 236.0 lb

## 2024-05-02 DIAGNOSIS — Z09 Encounter for follow-up examination after completed treatment for conditions other than malignant neoplasm: Secondary | ICD-10-CM

## 2024-05-02 NOTE — Progress Notes (Signed)
 [facility] Note   HPI:  63 y.o. Female presents to clinic for post-op follow-up status post excision of left breast cyst on 10/6.  Since the surgery she states that she has been doing well.  She denies any pain, fever, and chills.  She notes small drops of blood in her bra occasionally but otherwise has incision site issues.  Review of Systems:  All other review of systems: otherwise negative   Vital Signs:  BP 109/75   Pulse 76   Temp 97.8 F (36.6 C) (Oral)   Resp 18   Ht 5' 4 (1.626 m)   Wt 236 lb (107 kg)   SpO2 92%   BMI 40.51 kg/m    Physical Exam:  Physical Exam Vitals reviewed.  Constitutional:      Appearance: Normal appearance.  Chest:     Comments: Left breast incision site healing well with punctate scab at the lateral last Becht of the incision, no surrounding erythema, induration, and nontender to palpation Neurological:     Mental Status: She is alert.     Laboratory studies: None  Imaging:  None  Pathology:  A. CYST, LEFT BREAST, EXCISION:  - Epidermal inclusion cyst with focal rupture and associated foreign  body giant cell reaction.   Assessment:  63 y.o. yo Female who presents for follow-up status post excision of left breast cyst on 10/6  Plan:  - Patient has been doing well since his surgery.  No significant issues with pain, and no issues at her incision site - We discussed the pathology of her cyst.  We also discussed that she can develop a cyst in other locations on her body - Follow up with me as needed for issues at her incision site or emergence of other cyst  All of the above recommendations were discussed with the patient, and all of patient's questions were answered to her expressed satisfaction.  Note: Portions of this report may have been transcribed using voice recognition software. Every effort has been made to ensure accuracy; however, inadvertent computerized transcription errors may still be present.    Susan Brittle, DO [facility] [address] [place], [place] [address] [phone] (office)

## 2024-05-08 ENCOUNTER — Ambulatory Visit (INDEPENDENT_AMBULATORY_CARE_PROVIDER_SITE_OTHER): Admitting: Urology

## 2024-05-08 ENCOUNTER — Ambulatory Visit: Admitting: Urology

## 2024-05-08 VITALS — BP 111/73 | HR 80

## 2024-05-08 DIAGNOSIS — N3281 Overactive bladder: Secondary | ICD-10-CM | POA: Diagnosis not present

## 2024-05-08 DIAGNOSIS — N200 Calculus of kidney: Secondary | ICD-10-CM

## 2024-05-08 DIAGNOSIS — R1011 Right upper quadrant pain: Secondary | ICD-10-CM

## 2024-05-08 DIAGNOSIS — Z09 Encounter for follow-up examination after completed treatment for conditions other than malignant neoplasm: Secondary | ICD-10-CM

## 2024-05-08 LAB — URINALYSIS, ROUTINE W REFLEX MICROSCOPIC
Bilirubin, UA: NEGATIVE
Ketones, UA: NEGATIVE
Nitrite, UA: NEGATIVE
Specific Gravity, UA: 1.015 (ref 1.005–1.030)
Urobilinogen, Ur: 0.2 mg/dL (ref 0.2–1.0)
pH, UA: 6.5 (ref 5.0–7.5)

## 2024-05-08 LAB — MICROSCOPIC EXAMINATION

## 2024-05-08 NOTE — Progress Notes (Signed)
 05/08/2024 10:49 AM   [name] Na 1961-03-22 [id number]  Referring provider: Renato Baxter HERO, NP [id number]  [address] [place],  [place] [address]  Followup OAB   HPI: Ms [name] is a 62yo here for followup for OAb and nephrolithiasis. She underwent renal US  02/16/24 which showed a posisble right renal calculus, 4mm. KUB did not show the stone. She has intermittent right flank pain. She is doing well since 200 units intravesical botox . No urinary urgency and frequency    PMH: Past Medical History:  Diagnosis Date   Anxiety    Arthritis    Asthma    Cancer (HCC)    cervical cancer; pt states a long long time ago   CHF (congestive heart failure) (HCC)    Coronary artery disease    Depression    Diabetes mellitus without complication (HCC)    Dyspnea    Edema of all four extremities    Essential (primary) hypertension 06/24/2020   Hyperlipemia    Type II diabetes mellitus (HCC) 03/22/2022    Surgical History: Past Surgical History:  Procedure Laterality Date   ABDOMINAL HYSTERECTOMY     ANTERIOR LAT LUMBAR FUSION N/A 11/05/2019   Procedure: Lumbar Four-Five Anterolateral lumbar interbody fusion with lateral plate;  Surgeon: Susan Shove, MD;  Location: [facility];  Service: Neurosurgery;  Laterality: N/A;  anterolateral   BACK SURGERY     pins   BACK SURGERY  07/11/2008   BIOPSY  06/29/2021   Procedure: BIOPSY;  Surgeon: Susan Angelia Sieving, MD;  Location: [facility];  Service: Gastroenterology;;   BLADDER REPAIR     BREAST CYST EXCISION Left 04/15/2024   Procedure: EXCISION, CYST, BREAST;  Surgeon: Susan Dorothyann LABOR, DO;  Location: [facility];  Service: General;  Laterality: Left;   CARPAL TUNNEL RELEASE Right    CESAREAN SECTION  1982, 1984   COLONOSCOPY N/A 06/12/2013   rehman: normal   COLONOSCOPY WITH PROPOFOL  N/A 06/27/2023   Procedure: COLONOSCOPY WITH PROPOFOL ;  Surgeon: Susan Deatrice FALCON, MD;  Location: [facility];  Service: Endoscopy;   Laterality: N/A;  2:00PM;ASA 3   CORONARY PRESSURE/FFR STUDY N/A 10/20/2022   Procedure: CORONARY PRESSURE/FFR STUDY;  Surgeon: Susan Lurena POUR, MD;  Location: [facility];  Service: Cardiovascular;  Laterality: N/A;   CYSTOSCOPY WITH INJECTION N/A 03/23/2023   Procedure: CYSTOSCOPY WITH INJECTION-botox ;  Surgeon: Susan Belvie CROME, MD;  Location: [facility];  Service: Urology;  Laterality: N/A;   ESOPHAGOGASTRODUODENOSCOPY (EGD) WITH PROPOFOL  N/A 06/29/2021   Procedure: ESOPHAGOGASTRODUODENOSCOPY (EGD) WITH PROPOFOL ;  Surgeon: Susan Angelia Sieving, MD;  [facility];  Service: Gastroenterology;  Laterality: N/A;  8:40   KNEE ARTHROSCOPY WITH MEDIAL MENISECTOMY Right 05/09/2019   Procedure: KNEE ARTHROSCOPY WITH MEDIAL MENISECTOMY AND LATERAL MENISECTOMY;  Surgeon: Susan Taft BRAVO, MD;  Location: [facility];  Service: Orthopedics;  Laterality: Right;   KNEE ARTHROSCOPY WITH MEDIAL MENISECTOMY Left 06/27/2019   Procedure: KNEE ARTHROSCOPY WITH MEDIAL MENISCECTOMY;  Surgeon: Susan Taft BRAVO, MD;  Location: [facility];  Service: Orthopedics;  Laterality: Left;   LEFT HEART CATH AND CORONARY ANGIOGRAPHY N/A 10/20/2022   Procedure: LEFT HEART CATH AND CORONARY ANGIOGRAPHY;  Surgeon: Susan Lurena POUR, MD;  [facility];  Service: Cardiovascular;  Laterality: N/A;   POLYPECTOMY  06/27/2023   Procedure: POLYPECTOMY;  Surgeon: Susan Deatrice FALCON, MD;  [facility];  Service: Endoscopy;;   TOTAL KNEE ARTHROPLASTY Left 03/15/2022   Procedure: TOTAL KNEE ARTHROPLASTY;  Surgeon: Susan Taft BRAVO, MD;  Location: [facility];  Service: Orthopedics;  Laterality: Left;   TOTAL KNEE ARTHROPLASTY Right 02/21/2023   Procedure: TOTAL KNEE ARTHROPLASTY;  Surgeon: Susan Taft BRAVO, MD;  Location: [facility];  Service: Orthopedics;  Laterality: Right;    Home Medications:  Allergies as of 05/08/2024   No Known Allergies      Medication List        Accurate as of May 08, 2024 10:49 AM. If you have any questions, ask your nurse or doctor.          albuterol  108 (90 Base) MCG/ACT inhaler Commonly known as: VENTOLIN  HFA Inhale 1-2 puffs into the lungs every 6 (six) hours as needed for wheezing or shortness of breath.   ARIPiprazole  2 MG tablet Commonly known as: ABILIFY  Take 2 mg by mouth daily.   aspirin  81 MG chewable tablet Chew 1 tablet (81 mg total) by mouth 2 (two) times daily.   atorvastatin  80 MG tablet Commonly known as: LIPITOR Take 0.5 tablets (40 mg total) by mouth at bedtime.   estradiol  2 MG tablet Commonly known as: ESTRACE  TAKE ONE TABLET AT BEDTIME   FLUoxetine  40 MG capsule Commonly known as: PROZAC  Take 40 mg by mouth daily.   gabapentin  300 MG capsule Commonly known as: NEURONTIN  Take 1 capsule (300 mg total) by mouth 3 (three) times daily.   loratadine  10 MG tablet Commonly known as: CLARITIN  Take 10 mg by mouth daily.   nitroGLYCERIN  0.4 MG SL tablet Commonly known as: NITROSTAT  Place 1 tablet (0.4 mg total) under the tongue every 5 (five) minutes as needed for chest pain.   oxyCODONE -acetaminophen  7.5-325 MG tablet Commonly known as: PERCOCET Take 1 tablet by mouth 4 (four) times daily.   potassium chloride  SA 20 MEQ tablet Commonly known as: KLOR-CON  M Take 1 tablet (20 mEq total) by mouth daily.   spironolactone  25 MG tablet Commonly known as: Aldactone  Take 0.5 tablets (12.5 mg total) by mouth daily.   Synjardy  XR 12.11-998 MG Tb24 Generic drug: Empagliflozin -metFORMIN  HCl ER Take 1 tablet by mouth daily.   torsemide  20 MG tablet Commonly known as: DEMADEX  Take 2 tablets (40 mg total) by mouth daily.   traZODone  100 MG tablet Commonly known as: DESYREL  Take 200 mg by mouth at bedtime.        Allergies: No Known Allergies  Family History: Family History  Problem Relation Age of Onset   Cancer Paternal Grandfather    Cancer Paternal Grandmother    Cancer Maternal Grandmother     Heart disease Father    Lung cancer Mother    Other Brother        gunshot   Susan cancer Other     Social History:  reports that she has quit smoking. Her smoking use included cigarettes. She has a 20 pack-year smoking history. She has never used smokeless tobacco. She reports that she does not drink alcohol and does not use drugs.  ROS: All other review of systems were reviewed and are negative except what is noted above in HPI  Physical Exam: BP 111/73   Pulse 80   Constitutional:  Alert and oriented, No acute distress. HEENT: [place] AT, moist mucus membranes.  Trachea midline, no masses. Cardiovascular: No clubbing, cyanosis, or edema. Respiratory: Normal respiratory effort, no increased work of breathing. GI: Abdomen is soft, nontender, nondistended, no abdominal masses GU: No CVA tenderness.  Lymph: No cervical or inguinal lymphadenopathy. Skin: No rashes, bruises or  suspicious lesions. Neurologic: Grossly intact, no focal deficits, moving all 4 extremities. Psychiatric: Normal mood and affect.  Laboratory Data: Lab Results  Component Value Date   WBC 11.0 (H) 07/17/2023   HGB 10.3 (L) 07/17/2023   HCT 35.3 (L) 07/17/2023   MCV 73.2 (L) 07/17/2023   PLT 290 07/17/2023    Lab Results  Component Value Date   CREATININE 0.76 04/09/2024    No results found for: PSA  No results found for: TESTOSTERONE  Lab Results  Component Value Date   HGBA1C 5.6 04/09/2024    Urinalysis    Component Value Date/Time   COLORURINE AMBER (A) 01/28/2014 1847   APPEARANCEUR Clear 02/08/2024 1613   LABSPEC 1.022 01/28/2014 1847   PHURINE 6.0 01/28/2014 1847   GLUCOSEU 3+ (A) 02/08/2024 1613   HGBUR LARGE (A) 01/28/2014 1847   BILIRUBINUR Negative 02/08/2024 1613   KETONESUR NEGATIVE 01/28/2014 1847   PROTEINUR Negative 02/08/2024 1613   PROTEINUR 30 (A) 01/28/2014 1847   UROBILINOGEN 0.2 01/28/2014 1847   NITRITE Negative 02/08/2024 1613   NITRITE POSITIVE (A) 01/28/2014  1847   LEUKOCYTESUR 1+ (A) 02/08/2024 1613    Lab Results  Component Value Date   LABMICR See below: 02/08/2024   WBCUA 6-10 (A) 02/08/2024   LABEPIT 0-10 02/08/2024   MUCUS Present 09/02/2021   BACTERIA Few (A) 02/08/2024    Pertinent Imaging: Renal US  02/16/2024: Images reviewed and discussed with the patient  Results for orders placed during the hospital encounter of 02/16/24  DG Abd 1 View  Narrative CLINICAL DATA:  kidney stone  EXAM: ABDOMEN - 1 VIEW  COMPARISON:  June 14, 2013  FINDINGS: Nonobstructive bowel gas pattern. Moderate volume fecal loading throughout the Susan. No pneumoperitoneum. No organomegaly. Both renal fossae are obscured by overlying fecal material in the Susan. No calcifications in the pelvis to suggest distal ureteral calculi. Lumbosacral fusion hardware. Surgical clips in the pelvis. Multilevel degenerative disc disease of the spine. The lung bases are clear.  IMPRESSION: 1. Nonobstructive bowel gas pattern. Moderate volume fecal loading throughout the Susan. 2. Both renal fossa are obscured by overlying fecal material in the Susan.   Electronically Signed By: Rogelia Myers M.D. On: 02/18/2024 21:18  No results found for this or any previous visit.  No results found for this or any previous visit.  No results found for this or any previous visit.  Results for orders placed during the hospital encounter of 02/16/24  US  RENAL  Narrative CLINICAL DATA:  Provided history: kidney stone known or suspected  EXAM: RENAL / URINARY TRACT ULTRASOUND COMPLETE  COMPARISON:  Same-day abdominal radiograph  FINDINGS: Right Kidney:  Renal measurements: 12.4 x 5.7 x 4.3 cm = volume: 159 mL. Normal parenchymal echogenicity. No hydronephrosis. Possible but not definite 4 mm stone in the mid kidney. Echogenic focus with questionable posterior shadowing. No focal lesion.  Left Kidney:  Renal measurements: 12.5 x 6 x 5.1 cm = volume:  204 mL. No hydronephrosis or perinephric edema. Homogeneous renal enhancement with symmetric excretion on delayed phase imaging. Urinary bladder is physiologically distended without wall thickening.  Bladder:  Appears normal for degree of bladder distention. Both ureteral jets are demonstrated.  Other:  Increased hepatic echogenicity consistent with steatosis.  IMPRESSION: 1. Possible but not definite 4 mm nonobstructing right renal calculus. 2. No hydronephrosis. 3. Hepatic steatosis.   Electronically Signed By: Andrea Gasman M.D. On: 02/29/2024 23:40  No results found for this or any previous visit.  No  results found for this or any previous visit.  No results found for this or any previous visit.   Assessment & Plan:    1. OAB (overactive bladder) Followup 3 months for intravesical botox   2. Nephrolithiasis Cyt stone study, will call with resultd   No follow-ups on file.  Belvie Clara, MD  [facility] 

## 2024-05-08 NOTE — Progress Notes (Signed)
   Patient can void prior to the bladder scan. Bladder scan result: 5  Performed By: [facility] LPN

## 2024-05-08 NOTE — Patient Instructions (Signed)

## 2024-05-12 NOTE — Progress Notes (Unsigned)
 Cardiology Office Note:  .   Date:  05/15/2024  ID:  [name] Na, DOB 1960-08-12, MRN [id number] PCP: Renato Dorothey HERO, NP  [facility] Providers Cardiologist:  Darryle ONEIDA Decent, MD {  History of Present Illness: .    Chief Complaint  Patient presents with   Follow-up    [name] is a 64 y.o. female with history of CAD, HFpEF, DM, HLD, tobacco abuse who presents for follow-up.    History of Present Illness   [name] [name] is a 63 year old female with nonobstructive CAD, HFpEF, and hyperlipidemia who presents for follow-up.  She experiences persistent lower extremity edema, which fluctuates but does not significantly improve. The edema decreases when she increases her dose of torsemide  for a few days, but it returns once she stops the increased dose. She is currently taking torsemide  40 mg daily and potassium 20 mg daily. She has difficulty wearing compression stockings as they cut into her skin and inquires about using ACE bandages instead.  Her breathing is stable, and she has no chest pain. She manages her salt intake and incorporates more vegetables into her diet. She engages in regular physical activity, including walking a quarter to half a mile daily and participating in chair yoga at a health club.  Her diabetes is well-controlled with an A1c of 5.6. She is on Lipitor 80 mg for hyperlipidemia, and her most recent LDL cholesterol is 64, which is at goal. Her blood pressure is well-controlled at 126/64 mmHg on spironolactone  12.5 mg daily.  She is preparing for upcoming back surgery with Dr. Colon.          Problem List CAD -non-obstructive CAD LHC 10/20/2022 -CAC score 800 (99th percentile) -TPV 856 mm3 (calcified 231 mm3; non-calcified plaque 625 mm3) -T chol 128, HDL 48, LDL 64, TG 81 2. HFpEF -LVEDP 32 LHC 10/20/2022 3. DM -A1c 5.6 4. HLD 5. Tobacco abuse     ROS: All other ROS reviewed and negative. Pertinent positives noted  in the HPI.     Studies Reviewed: SABRA       TTE 09/15/2023  1. Left ventricular ejection fraction, by estimation, is 55 to 60%. Left  ventricular ejection fraction by 3D volume is 55 %. The left ventricle has  normal function. The left ventricle has no regional wall motion  abnormalities. Left ventricular diastolic   parameters were normal. The average left ventricular global longitudinal  strain is -24.0 %. The global longitudinal strain is normal.   2. Right ventricular systolic function is normal. The right ventricular  size is mildly enlarged. There is normal pulmonary artery systolic  pressure. The estimated right ventricular systolic pressure is 24.2 mmHg.   3. Left atrial size was mildly dilated.   4. The mitral valve is normal in structure. Trivial mitral valve  regurgitation. No evidence of mitral stenosis.   5. The aortic valve is tricuspid. Aortic valve regurgitation is mild.  Aortic valve sclerosis/calcification is present, without any evidence of  aortic stenosis.   6. The inferior vena cava is normal in size with greater than 50%  respiratory variability, suggesting right atrial pressure of 3 mmHg.  Physical Exam:   VS:  BP 126/64 (BP Location: Left Arm, Patient Position: Sitting)   Pulse 75   Ht 5' 4 (1.626 m)   Wt 232 lb 3.2 oz (105.3 kg)   SpO2 93%   BMI 39.86 kg/m    Wt Readings from Last 3 Encounters:  05/15/24 232 lb 3.2 oz (105.3 kg)  05/02/24 236 lb (107 kg)  04/18/24 235 lb (106.6 kg)    GEN: Well nourished, well developed in no acute distress NECK: No JVD; No carotid bruits CARDIAC: RRR, no murmurs, rubs, gallops RESPIRATORY:  Clear to auscultation without rales, wheezing or rhonchi  ABDOMEN: Soft, non-tender, non-distended EXTREMITIES:  1+ pitting edema  ASSESSMENT AND PLAN: .   Assessment and Plan    Chronic venous insufficiency HFpEF Persistent leg swelling with fluctuating edema. Compression stockings not tolerated. Risk of skin ulcers and  deformities due to dry skin and swelling. - Increased torsemide  to 40 mg in the morning and 20 mg in the afternoon. - Encouraged leg elevation and use of ACE bandages. - Advised on salt intake reduction. - Continue exercise regimen, including walking up to a half mile per day and chair yoga.  Atherosclerotic heart disease of native coronary artery without angina Nonobstructive coronary artery disease with elevated calcium  score. No chest pain reported. - Continue current management and monitoring.  Essential hypertension Blood pressure well controlled at 126/64 mmHg on current medication. - Continue current antihypertensive regimen.  Type 2 diabetes mellitus, well controlled Diabetes well controlled with an A1c of 5.6%. - Continue current diabetes management regimen.  Mixed hyperlipidemia Cholesterol levels at goal with LDL at 64 mg/dL. - Continue current lipid-lowering therapy.              Follow-up: Return in about 6 months (around 11/12/2024).   Signed, Darryle DASEN. Barbaraann, MD, [facility]  [facility]  [address] [place], [place] [address] [phone]  8:41 AM

## 2024-05-13 ENCOUNTER — Encounter: Payer: Self-pay | Admitting: Radiology

## 2024-05-14 ENCOUNTER — Encounter: Payer: Self-pay | Admitting: Urology

## 2024-05-14 ENCOUNTER — Other Ambulatory Visit (HOSPITAL_COMMUNITY): Payer: Self-pay | Admitting: Neurological Surgery

## 2024-05-14 DIAGNOSIS — M4316 Spondylolisthesis, lumbar region: Secondary | ICD-10-CM

## 2024-05-15 ENCOUNTER — Encounter: Payer: Self-pay | Admitting: Cardiovascular Disease

## 2024-05-15 ENCOUNTER — Ambulatory Visit: Attending: Cardiology | Admitting: Cardiovascular Disease

## 2024-05-15 VITALS — BP 126/64 | HR 75 | Ht 64.0 in | Wt 232.2 lb

## 2024-05-15 DIAGNOSIS — I5032 Chronic diastolic (congestive) heart failure: Secondary | ICD-10-CM | POA: Diagnosis not present

## 2024-05-15 DIAGNOSIS — I10 Essential (primary) hypertension: Secondary | ICD-10-CM | POA: Diagnosis not present

## 2024-05-15 DIAGNOSIS — I2510 Atherosclerotic heart disease of native coronary artery without angina pectoris: Secondary | ICD-10-CM | POA: Diagnosis not present

## 2024-05-15 DIAGNOSIS — I872 Venous insufficiency (chronic) (peripheral): Secondary | ICD-10-CM

## 2024-05-15 DIAGNOSIS — E782 Mixed hyperlipidemia: Secondary | ICD-10-CM | POA: Diagnosis not present

## 2024-05-15 MED ORDER — TORSEMIDE 20 MG PO TABS
ORAL_TABLET | ORAL | 3 refills | Status: AC
Start: 2024-05-15 — End: ?

## 2024-05-15 MED ORDER — ASPIRIN 81 MG PO TBEC: 81.0000 mg | DELAYED_RELEASE_TABLET | Freq: Every day | ORAL | Status: AC

## 2024-05-15 NOTE — Patient Instructions (Signed)
 Medication Instructions:   Start taking Torsemide  40 mg ( 2 tablet) in the morning and 20 mg ( 1 tablet in afternoon )    Aspirin  81 mg daily  *If you need a refill on your cardiac medications before your next appointment, please call your pharmacy*   Lab Work: Not needed   If you have labs (blood work) drawn today and your tests are completely normal, you will receive your results only by: MyChart Message (if you have MyChart) OR A paper copy in the mail If you have any lab test that is abnormal or we need to change your treatment, we will call you to review the results.   Testing/Procedures:  Not needed  Follow-Up: At [facility], you and your health needs are our priority.  As part of our continuing mission to provide you with exceptional heart care, we have created designated Provider Care Teams.  These Care Teams include your primary Cardiologist (physician) and Advanced Practice Providers (APPs -  Physician Assistants and Nurse Practitioners) who all work together to provide you with the care you need, when you need it.     Your next appointment:   6 month(s)  The format for your next appointment:   In Person  Provider:    Katlyn West Np ,Damien Braver NP   Other Instructions  Elevate legs

## 2024-05-21 ENCOUNTER — Ambulatory Visit (HOSPITAL_COMMUNITY)
Admission: RE | Admit: 2024-05-21 | Discharge: 2024-05-21 | Disposition: A | Discharge status: Home / Self Care | Source: Ambulatory Visit | Attending: Neurological Surgery | Admitting: Neurological Surgery

## 2024-05-21 DIAGNOSIS — M4316 Spondylolisthesis, lumbar region: Secondary | ICD-10-CM | POA: Insufficient documentation

## 2024-05-21 MED ORDER — GADOBUTROL 1 MMOL/ML IV SOLN
10.0000 mL | Freq: Once | INTRAVENOUS | Status: AC | PRN
  Administered 2024-05-21: 10 mL via INTRAVENOUS

## 2024-06-26 ENCOUNTER — Ambulatory Visit (HOSPITAL_COMMUNITY): Admission: RE | Admit: 2024-06-26 | Discharge: 2024-06-26 | Attending: Urology

## 2024-06-26 DIAGNOSIS — R1011 Right upper quadrant pain: Secondary | ICD-10-CM | POA: Diagnosis present

## 2024-09-02 ENCOUNTER — Ambulatory Visit: Admitting: Urology

## 2024-12-09 ENCOUNTER — Ambulatory Visit: Payer: 59 | Admitting: Orthopedic Surgery
